# Patient Record
Sex: Female | Born: 1940 | Race: White | Hispanic: No | Marital: Single | Smoking: Former smoker
Health system: Southern US, Community
[De-identification: ages and names within clinical notes are randomized; demographics above are authoritative.]

## PROBLEM LIST (undated history)

## (undated) DIAGNOSIS — J302 Other seasonal allergic rhinitis: Secondary | ICD-10-CM

## (undated) DIAGNOSIS — H269 Unspecified cataract: Secondary | ICD-10-CM

## (undated) DIAGNOSIS — T4145XA Adverse effect of unspecified anesthetic, initial encounter: Secondary | ICD-10-CM

## (undated) DIAGNOSIS — I10 Essential (primary) hypertension: Secondary | ICD-10-CM

## (undated) DIAGNOSIS — B019 Varicella without complication: Secondary | ICD-10-CM

## (undated) DIAGNOSIS — M810 Age-related osteoporosis without current pathological fracture: Secondary | ICD-10-CM

## (undated) DIAGNOSIS — R32 Unspecified urinary incontinence: Secondary | ICD-10-CM

## (undated) DIAGNOSIS — N390 Urinary tract infection, site not specified: Secondary | ICD-10-CM

## (undated) DIAGNOSIS — N183 Chronic kidney disease, stage 3 unspecified: Secondary | ICD-10-CM

## (undated) DIAGNOSIS — C4490 Unspecified malignant neoplasm of skin, unspecified: Secondary | ICD-10-CM

## (undated) DIAGNOSIS — K219 Gastro-esophageal reflux disease without esophagitis: Secondary | ICD-10-CM

## (undated) DIAGNOSIS — C801 Malignant (primary) neoplasm, unspecified: Secondary | ICD-10-CM

## (undated) DIAGNOSIS — Z923 Personal history of irradiation: Secondary | ICD-10-CM

## (undated) DIAGNOSIS — T8859XA Other complications of anesthesia, initial encounter: Secondary | ICD-10-CM

## (undated) DIAGNOSIS — E785 Hyperlipidemia, unspecified: Secondary | ICD-10-CM

## (undated) DIAGNOSIS — G9050 Complex regional pain syndrome I, unspecified: Secondary | ICD-10-CM

## (undated) DIAGNOSIS — Z9221 Personal history of antineoplastic chemotherapy: Secondary | ICD-10-CM

## (undated) DIAGNOSIS — Z9889 Other specified postprocedural states: Secondary | ICD-10-CM

## (undated) DIAGNOSIS — E1165 Type 2 diabetes mellitus with hyperglycemia: Secondary | ICD-10-CM

## (undated) DIAGNOSIS — M1990 Unspecified osteoarthritis, unspecified site: Secondary | ICD-10-CM

## (undated) DIAGNOSIS — R112 Nausea with vomiting, unspecified: Secondary | ICD-10-CM

## (undated) HISTORY — PX: OOPHORECTOMY: SHX86

## (undated) HISTORY — PX: PORT-A-CATH REMOVAL: SHX5289

## (undated) HISTORY — DX: Unspecified malignant neoplasm of skin, unspecified: C44.90

## (undated) HISTORY — DX: Varicella without complication: B01.9

## (undated) HISTORY — DX: Unspecified cataract: H26.9

## (undated) HISTORY — DX: Unspecified osteoarthritis, unspecified site: M19.90

## (undated) HISTORY — PX: TUBAL LIGATION: SHX77

## (undated) HISTORY — DX: Malignant (primary) neoplasm, unspecified: C80.1

## (undated) HISTORY — DX: Other seasonal allergic rhinitis: J30.2

## (undated) HISTORY — DX: Urinary tract infection, site not specified: N39.0

## (undated) HISTORY — PX: TUMOR EXCISION: SHX421

## (undated) HISTORY — DX: Unspecified urinary incontinence: R32

## (undated) HISTORY — PX: OTHER SURGICAL HISTORY: SHX169

---

## 2003-04-15 HISTORY — PX: ABDOMINAL HYSTERECTOMY: SHX81

## 2004-03-12 ENCOUNTER — Other Ambulatory Visit: Payer: Self-pay

## 2004-03-14 ENCOUNTER — Ambulatory Visit: Payer: Self-pay | Admitting: Unknown Physician Specialty

## 2004-04-14 HISTORY — PX: BLADDER TUMOR EXCISION: SHX238

## 2004-07-25 ENCOUNTER — Ambulatory Visit: Payer: Self-pay | Admitting: Unknown Physician Specialty

## 2005-06-26 ENCOUNTER — Ambulatory Visit: Payer: Self-pay | Admitting: Unknown Physician Specialty

## 2006-04-01 ENCOUNTER — Ambulatory Visit: Payer: Self-pay | Admitting: Urology

## 2006-04-29 ENCOUNTER — Other Ambulatory Visit: Payer: Self-pay

## 2006-05-06 ENCOUNTER — Ambulatory Visit: Payer: Self-pay | Admitting: Urology

## 2007-01-06 ENCOUNTER — Ambulatory Visit: Payer: Self-pay | Admitting: Urology

## 2007-01-20 ENCOUNTER — Ambulatory Visit: Payer: Self-pay | Admitting: Urology

## 2007-12-01 LAB — HM PAP SMEAR: HM PAP: NORMAL

## 2007-12-19 LAB — HM MAMMOGRAPHY: HM MAMMO: NORMAL (ref 0–4)

## 2013-04-14 HISTORY — PX: SKIN CANCER EXCISION: SHX779

## 2015-05-10 ENCOUNTER — Encounter: Payer: Self-pay | Admitting: Primary Care

## 2015-05-10 ENCOUNTER — Ambulatory Visit (INDEPENDENT_AMBULATORY_CARE_PROVIDER_SITE_OTHER): Payer: PPO | Admitting: Primary Care

## 2015-05-10 VITALS — BP 136/84 | HR 102 | Temp 97.7°F | Ht 65.5 in | Wt 158.8 lb

## 2015-05-10 DIAGNOSIS — N390 Urinary tract infection, site not specified: Secondary | ICD-10-CM | POA: Insufficient documentation

## 2015-05-10 DIAGNOSIS — I10 Essential (primary) hypertension: Secondary | ICD-10-CM | POA: Diagnosis not present

## 2015-05-10 DIAGNOSIS — N3946 Mixed incontinence: Secondary | ICD-10-CM | POA: Insufficient documentation

## 2015-05-10 DIAGNOSIS — R3981 Functional urinary incontinence: Secondary | ICD-10-CM | POA: Diagnosis not present

## 2015-05-10 DIAGNOSIS — R32 Unspecified urinary incontinence: Secondary | ICD-10-CM | POA: Insufficient documentation

## 2015-05-10 NOTE — Progress Notes (Signed)
Pre visit review using our clinic review tool, if applicable. No additional management support is needed unless otherwise documented below in the visit note. 

## 2015-05-10 NOTE — Patient Instructions (Signed)
Please schedule a physical with me in July 2017. You may also schedule a lab only appointment 3-4 days prior. We will discuss your lab results in detail during your physical.  I think your cough is related to your lisinopril blood pressure medication. Please notify me if you are interested in changing to another blood pressure medication. I recommend this.  It was a pleasure to meet you today! Please don't hesitate to call me with any questions. Welcome to [facility]!

## 2015-05-10 NOTE — Assessment & Plan Note (Signed)
Has RX for cipro 500 mg BID from prior PCP that she will take as needed. UTI's occur every 4-5 months.  No recent symptoms. She is interested in taking cranberry tablets.

## 2015-05-10 NOTE — Assessment & Plan Note (Signed)
Diagnosed years ago. Managed on lisinopril 20 mg for years. Dry cough also for years. Discussed this is likely due to ACE and suggested we trial a different antihypertensive. She will think about this and notify me if interested. Will obtain records for recent BMP.

## 2015-05-10 NOTE — Assessment & Plan Note (Signed)
Functional. 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 Wears depends. Tolerable symptoms. Discussed options.

## 2015-05-10 NOTE — Progress Notes (Signed)
Subjective:    Patient ID: Catherine Tate, female    DOB: [birth year], 75 y.o.   MRN: [id number]  HPI  Ms. Kitchen is a 75 year old female who presents today to establish care and discuss the problems mentioned below. Will obtain old records. Her last physical was in July 2016.  1) Essential Hypertension: Diagnosed years ago. Currently managed on lisinopril 20 mg. Denies chest pain, shortness of breath. Occasional cough which she attributes to sinus drainage. She describes her cough as a dry, tickle, that will occur sporadically. She has a bottle of phenergan with codeine that was prescribed by her prior PCP that provides some improvement with chronic cough.  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 She will have to wear depends and pads daily. She will get urinary tract infections every 4-5 months. She will take Ciprofloxacin 500 mg twice daily for 7-10 days when she gets urinary tract infections. This prescription was provided by her prior PCP.  Review of Systems  Constitutional: Negative for unexpected weight change.  HENT: Negative for rhinorrhea.   Respiratory: Negative for cough and shortness of breath.   Cardiovascular: Negative for chest pain.  Gastrointestinal: Negative for diarrhea and constipation.  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  Skin: Negative for rash.  Allergic/Immunologic: Positive for environmental allergies.  Neurological: Negative for dizziness, numbness and headaches.  Psychiatric/Behavioral:       Denies concerns for anxiety or depression       Past Medical History  Diagnosis Date  . Chickenpox   . Seasonal allergies   . Urine incontinence   . Frequent UTI   . Arthritis     Social History   Social History  . Marital Status: Single    Spouse Name: N/A  . Number of Children: N/A  . Years of Education: N/A   Occupational History  . Not on file.   Social  History Main Topics  . Smoking status: Never Smoker   . Smokeless tobacco: Not on file  . Alcohol Use: No  . Drug Use: Not on file  . Sexual Activity: Not on file   Other Topics Concern  . Not on file   Social History Narrative   Single.   2 children, 1 grandchild.   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   Enjoys making jewelry, puzzle books, sewing, traveling.       Past Surgical History  Procedure Laterality Date  . Bladder tumor excision  2006  . Skin cancer excision  2015    Family History  Problem Relation Age of Onset  . Emphysema Father   . Heart disease Mother   . Transient ischemic attack Mother     Allergies not on file  No current outpatient prescriptions on file prior to visit.   No current facility-administered medications on file prior to visit.    BP 136/84 mmHg  Pulse 102  Temp(Src) 97.7 F (36.5 C) (Oral)  Ht 5' 5.5" (1.664 m)  Wt 158 lb 12.8 oz (72.031 kg)  BMI 26.01 kg/m2  SpO2 96%    Objective:   Physical Exam  Constitutional: She is oriented to person, place, and time. She appears well-nourished.  HENT:  Mouth/Throat: Oropharynx is clear and moist.  Dry cough noted during exam today.  Cardiovascular: Normal rate and regular rhythm.   Pulmonary/Chest: Effort normal and breath sounds  normal.  Neurological: She is alert and oriented to person, place, and time.  Skin: Skin is warm and dry.  Psychiatric: She has a normal mood and affect.          Assessment & Plan:

## 2015-08-15 ENCOUNTER — Other Ambulatory Visit: Payer: Self-pay

## 2015-08-15 MED ORDER — LISINOPRIL 20 MG PO TABS
20.0000 mg | ORAL_TABLET | Freq: Every day | ORAL | Status: DC
Start: 2015-08-15 — End: 2015-11-05

## 2015-08-15 NOTE — Telephone Encounter (Signed)
[place] left v/m requesting refill lisinopril; per 05/10/15 office note may be changing lisinopril;pt has med wellness scheduled 11/05/15. Refilled x 1.

## 2015-10-23 ENCOUNTER — Other Ambulatory Visit: Payer: Self-pay | Admitting: Primary Care

## 2015-10-23 DIAGNOSIS — Z0000 Encounter for general adult medical examination without abnormal findings: Secondary | ICD-10-CM

## 2015-10-23 DIAGNOSIS — I10 Essential (primary) hypertension: Secondary | ICD-10-CM

## 2015-10-29 ENCOUNTER — Other Ambulatory Visit (INDEPENDENT_AMBULATORY_CARE_PROVIDER_SITE_OTHER): Payer: PPO

## 2015-10-29 ENCOUNTER — Ambulatory Visit (INDEPENDENT_AMBULATORY_CARE_PROVIDER_SITE_OTHER): Payer: PPO | Admitting: Internal Medicine

## 2015-10-29 ENCOUNTER — Encounter: Payer: Self-pay | Admitting: Internal Medicine

## 2015-10-29 VITALS — BP 136/72 | HR 102 | Temp 98.2°F | Wt 161.0 lb

## 2015-10-29 DIAGNOSIS — Z0000 Encounter for general adult medical examination without abnormal findings: Secondary | ICD-10-CM | POA: Diagnosis not present

## 2015-10-29 DIAGNOSIS — N390 Urinary tract infection, site not specified: Secondary | ICD-10-CM | POA: Diagnosis not present

## 2015-10-29 DIAGNOSIS — I10 Essential (primary) hypertension: Secondary | ICD-10-CM

## 2015-10-29 DIAGNOSIS — R300 Dysuria: Secondary | ICD-10-CM | POA: Diagnosis not present

## 2015-10-29 LAB — COMPREHENSIVE METABOLIC PANEL
ALBUMIN: 4.3 g/dL (ref 3.5–5.2)
ALT: 24 U/L (ref 0–35)
AST: 19 U/L (ref 0–37)
Alkaline Phosphatase: 54 U/L (ref 39–117)
BUN: 21 mg/dL (ref 6–23)
CO2: 25 mEq/L (ref 19–32)
CREATININE: 1.14 mg/dL (ref 0.40–1.20)
Calcium: 9.3 mg/dL (ref 8.4–10.5)
Chloride: 109 mEq/L (ref 96–112)
GFR: 49.56 mL/min — ABNORMAL LOW (ref >60.00)
GLUCOSE: 101 mg/dL — AB (ref 70–99)
Potassium: 4.3 mEq/L (ref 3.5–5.1)
SODIUM: 139 meq/L (ref 135–145)
TOTAL PROTEIN: 7.5 g/dL (ref 6.0–8.3)
Total Bilirubin: 0.6 mg/dL (ref 0.2–1.2)

## 2015-10-29 LAB — POC URINALSYSI DIPSTICK (AUTOMATED)
BILIRUBIN UA: NEGATIVE
Glucose, UA: NEGATIVE
KETONES UA: NEGATIVE
NITRITE UA: POSITIVE
PH UA: 6
Protein, UA: NEGATIVE
RBC UA: NEGATIVE
SPEC GRAV UA: 1.025
Urobilinogen, UA: NEGATIVE

## 2015-10-29 LAB — LIPID PANEL
CHOLESTEROL: 221 mg/dL — AB (ref 0–200)
HDL: 44.1 mg/dL (ref >39.00)
LDL CALC: 153 mg/dL — AB (ref 0–99)
NONHDL: 177.2
Total CHOL/HDL Ratio: 5
Triglycerides: 122 mg/dL (ref 0.0–149.0)
VLDL: 24.4 mg/dL (ref 0.0–40.0)

## 2015-10-29 LAB — HEMOGLOBIN A1C: HEMOGLOBIN A1C: 5.7 % (ref 4.6–6.5)

## 2015-10-29 LAB — VITAMIN D 25 HYDROXY (VIT D DEFICIENCY, FRACTURES): VITD: 27.37 ng/mL — AB (ref 30.00–100.00)

## 2015-10-29 MED ORDER — CIPROFLOXACIN HCL 250 MG PO TABS: 250.0000 mg | ORAL_TABLET | Freq: Two times a day (BID) | ORAL | Status: DC

## 2015-10-29 NOTE — Progress Notes (Signed)
Pre visit review using our clinic review tool, if applicable. No additional management support is needed unless otherwise documented below in the visit note. 

## 2015-10-29 NOTE — Patient Instructions (Signed)

## 2015-10-29 NOTE — Progress Notes (Signed)
   Subjective:    Patient ID: Catherine Tate, female    DOB: 01-27-1941, 75 y.o.   MRN: [id number]  HPI  Pt presents to the clinic today with complaint of burning with urination x 1 week.  She reports a history of UTI"s 3-4 times/year and a history of incontinence.  Her last UTI was in January, treated with Ciprofloxacin from prior PCP.  She has been taking Azo for 1 week with some relief.  She denies hematuria, changes in frequency, back pain, abdominal pain, nausea, or vomiting.     Review of Systems   Past Medical History  Diagnosis Date  . Chickenpox   . Seasonal allergies   . Urine incontinence   . Frequent UTI   . Arthritis     Current Outpatient Prescriptions  Medication Sig Dispense Refill  . lisinopril (PRINIVIL,ZESTRIL) 20 MG tablet Take 1 tablet (20 mg total) by mouth daily. 90 tablet 0  . ciprofloxacin (CIPRO) 250 MG tablet Take 1 tablet (250 mg total) by mouth 2 (two) times daily. 14 tablet 0   No current facility-administered medications for this visit.    Allergies  Allergen Reactions  . Other Other (See Comments)    Uncoded Allergy. Allergen: VICRYL, Other Reaction: Infection  . Gabapentin Swelling    Family History  Problem Relation Age of Onset  . Emphysema Father   . Heart disease Mother   . Transient ischemic attack Mother     Social History   Social History  . Marital Status: Single    Spouse Name: N/A  . Number of Children: N/A  . Years of Education: N/A   Occupational History  . Not on file.   Social History Main Topics  . Smoking status: Never Smoker   . Smokeless tobacco: Never Used  . Alcohol Use: No  . Drug Use: Not on file  . Sexual Activity: Not on file   Other Topics Concern  . Not on file   Social History Narrative   Single.   2 children, 1 grandchild.   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   Enjoys making jewelry, puzzle books, sewing, traveling.        GI: Denies abdominal pain, nausea, or vomiting. GU:  Admits to burning with urination.  Denies hematuria or changes in frequency.  No other specific complaints in a complete review of systems (except as listed in HPI above).       Objective:   Physical Exam BP 136/72 mmHg  Pulse 102  Temp(Src) 98.2 F (36.8 C) (Oral)  Wt 161 lb (73.029 kg)  SpO2 97%   General: Well-appearing, appears stated age in no acute distress Pulm: Clear to auscultation bilaterally CV: Regular rate and rhythm. Normal S1, S2 auscultated. No murmurs, rubs, or gallops. Abd: Positive bowel sounds all four quadrants. Soft, nontender to palpation. GU: Negative CVA tenderness.      Assessment & Plan:   Dysuria, secondary to Urinary tract infection  eRx for Ciprofloxacin 250 mg BID x7 days. Culture pending. Push fluids Ok to take AZO OTC  Call if symptoms worsen or do not resolve.

## 2015-10-29 NOTE — Addendum Note (Signed)
Addended by: Lurlean Nanny on: 10/29/2015 04:35 PM   Modules accepted: Orders, SmartSet

## 2015-11-01 ENCOUNTER — Other Ambulatory Visit: Payer: Self-pay | Admitting: Internal Medicine

## 2015-11-01 LAB — URINE CULTURE: Colony Count: >=100000

## 2015-11-01 MED ORDER — NITROFURANTOIN MONOHYD MACRO 100 MG PO CAPS: 100.0000 mg | ORAL_CAPSULE | Freq: Two times a day (BID) | ORAL | Status: DC

## 2015-11-05 ENCOUNTER — Telehealth: Payer: Self-pay | Admitting: Primary Care

## 2015-11-05 ENCOUNTER — Ambulatory Visit (INDEPENDENT_AMBULATORY_CARE_PROVIDER_SITE_OTHER): Payer: PPO | Admitting: Primary Care

## 2015-11-05 ENCOUNTER — Encounter: Payer: Self-pay | Admitting: Primary Care

## 2015-11-05 VITALS — BP 136/82 | HR 100 | Temp 97.4°F | Resp 18 | Ht 65.5 in | Wt 163.4 lb

## 2015-11-05 DIAGNOSIS — Z0000 Encounter for general adult medical examination without abnormal findings: Secondary | ICD-10-CM | POA: Diagnosis not present

## 2015-11-05 DIAGNOSIS — N390 Urinary tract infection, site not specified: Secondary | ICD-10-CM | POA: Diagnosis not present

## 2015-11-05 DIAGNOSIS — I10 Essential (primary) hypertension: Secondary | ICD-10-CM

## 2015-11-05 DIAGNOSIS — Z23 Encounter for immunization: Secondary | ICD-10-CM | POA: Diagnosis not present

## 2015-11-05 MED ORDER — PNEUMOCOCCAL 13-VAL CONJ VACC IM SUSP
0.5000 mL | INTRAMUSCULAR | Status: AC
  Administered 2015-11-05: 0.5 mL via INTRAMUSCULAR

## 2015-11-05 MED ORDER — AMLODIPINE BESYLATE 10 MG PO TABS: 10.0000 mg | ORAL_TABLET | Freq: Every day | ORAL | 1 refills | Status: DC

## 2015-11-05 NOTE — Assessment & Plan Note (Signed)
Tetanus UTD, Prevnar provided today, pneumovax due in 1 year. Declines mammogram, colon cancer screening due to age which is acceptable. Declines bone density testing despite recommendations, will start daily vitamin D. Labs overall unremarkable, slightly elevated cholesterol levels, will supplement vitamin D. Fair diet, does not exercise. Discussed the importance of a healthy diet and regular exercise in order for weight loss and to reduce risk of other medical diseases. Recheck cholesterol in 6 months after healthy lifestyle changes. Exam unremarkable. No complaints.  I have personally reviewed and have noted: 1. The patient's medical and social history 2. Their use of alcohol, tobacco or illicit drugs 3. Their current medications and supplements 4. The patient's functional ability including ADL's, fall  risks, home safety risks and hearing or visual  impairment. 5. Diet and physical activities 6. Evidence for depression or mood disorder  Follow up in 6 months for repeat physical.

## 2015-11-05 NOTE — Progress Notes (Signed)
Pre visit review using our clinic review tool, if applicable. No additional management support is needed unless otherwise documented below in the visit note. 

## 2015-11-05 NOTE — Telephone Encounter (Signed)
Spoken to patient and notified patient that she may come and pick the medical records. Patient asked if there are any part of the medical records she need to keep. Notified patient that Anda Kraft got everything she needed from the file. Patient stated she does not need it and asked to shred the medical records.

## 2015-11-05 NOTE — Patient Instructions (Signed)
Stop Lisinopril tablets for high blood pressure.  Start Amlodipine 10 mg tablets for high blood pressure. Take 1 tablet by mouth everyday.  Monitor your blood pressure and notify me if you develop readings above 150/90 or below 100/60. Please also notify me if you start to experience dizziness or weakness.  You were provided with a pneumonia vaccination. You will be due for one additional vaccination in 1 year.  Work to [employer] by increasing whole grains, lean protein, vegetables.   Start exercising. You should be getting 30 minutes of moderate intensity exercise 3-4 days weekly.  Start Vitamin D 1000 unit capsules once daily to replace vitamin D. This may be purchased over the counter.  Follow up in 6 months for cholesterol check and blood pressure check.  It was a pleasure to see you today!

## 2015-11-05 NOTE — Addendum Note (Signed)
Addended by: Jacqualin Combes on: 11/05/2015 08:56 AM   Modules accepted: Orders, SmartSet

## 2015-11-05 NOTE — Assessment & Plan Note (Signed)
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 Will stop ACE, start Amlodipine. Will have patient monitor home BP, parameters provided. BMP stable. Follow up in 6 months.

## 2015-11-05 NOTE — Progress Notes (Signed)
Patient ID: Catherine Tate, female   DOB: 11-Feb-1941, 75 y.o.   MRN: [id number]  HPI: Ms. Kesselring is a 75 year female who presents today for her annual wellness exam.  Past Medical History:  Diagnosis Date  . Arthritis   . Chickenpox   . Frequent UTI   . Seasonal allergies   . Urine incontinence     Current Outpatient Prescriptions  Medication Sig Dispense Refill  . lisinopril (PRINIVIL,ZESTRIL) 20 MG tablet Take 1 tablet (20 mg total) by mouth daily. 90 tablet 0  . nitrofurantoin, macrocrystal-monohydrate, (MACROBID) 100 MG capsule Take 1 capsule (100 mg total) by mouth 2 (two) times daily. 14 capsule 0   No current facility-administered medications for this visit.     Allergies  Allergen Reactions  . Other Other (See Comments)    Uncoded Allergy. Allergen: VICRYL, Other Reaction: Infection  . Gabapentin Swelling    Family History  Problem Relation Age of Onset  . Emphysema Father   . Heart disease Mother   . Transient ischemic attack Mother     Social History   Social History  . Marital status: Single    Spouse name: N/A  . Number of children: N/A  . Years of education: N/A   Occupational History  . Not on file.   Social History Main Topics  . Smoking status: Never Smoker  . Smokeless tobacco: Never Used  . Alcohol use No  . Drug use: Unknown  . Sexual activity: Not on file   Other Topics Concern  . Not on file   Social History Narrative   Single.   2 children, 1 grandchild.   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   Enjoys making jewelry, puzzle books, sewing, traveling.       Hospitiliaztions: None  Health Maintenance:    Flu: Completed last season  Tetanus: Completed in 2012  Pneumovax: Never completed  Prevnar: Never completed  Zostavax: Completed in 2001  Bone Density: Not completed recently, declines.  Colonoscopy: Never completed. Declines.   Eye Doctor: Completed in 2016, no changes in prescription.  Dental Exam:  Dentures  Mammogram: Completed 4 years ago. Completes self breast exam.  Pap: Hysterectomy    Providers: Alma Friendly, PCP   I have personally reviewed and have noted: 1. The patient's medical and social history 2. Their use of alcohol, tobacco or illicit drugs 3. Their current medications and supplements 4. The patient's functional ability including ADL's, fall risks, home safety risks  and hearing or visual impairment. 5. Diet and physical activities 6. Evidence for depression or mood disorder  Subjective:   Review of Systems:   Constitutional: Denies fever, malaise, fatigue, headache or abrupt weight changes.  HEENT: Denies eye pain, eye redness, ear pain, ringing in the ears, wax buildup, runny nose, nasal congestion, bloody nose, or sore throat. Occasional sinus pressure during seasonal changes. Respiratory: Denies difficulty breathing, shortness of breath, cough or sputum production.   Cardiovascular: Denies chest pain, chest tightness, palpitations or swelling in the hands or feet. She does experience a cough, managed on lisinopril, does not wish to change. Gastrointestinal: Denies abdominal pain, bloating, constipation, diarrhea or blood in the stool.  GU: Denies urgency, frequency, pain with urination, burning sensation, blood in urine, odor or discharge. Treated for UTI last week, feeling improved. Does experience incontinence, overall tolerable. Musculoskeletal: Denies decrease in range of motion, difficulty with gait, muscle pain. Chronic arthritis, not bothersome.  Skin: Denies redness, rashes, lesions or ulcercations.  Neurological:  Denies dizziness, difficulty with memory, difficulty with speech or problems with balance and coordination.   No other specific complaints in a complete review of systems (except as listed in HPI above).  Objective:  PE:   There were no vitals taken for this visit. Wt Readings from Last 3 Encounters:  10/29/15 161 lb (73 kg)   05/10/15 158 lb 12.8 oz (72 kg)    General: Appears their stated age, well developed, well nourished in NAD. Skin: Warm, dry and intact. No rashes, lesions or ulcerations noted. HEENT: Head: normal shape and size; Eyes: sclera white, no icterus, conjunctiva pink, PERRLA and EOMs intact; Ears: Tm's gray and intact, normal light reflex; Nose: mucosa pink and moist, septum midline; Throat/Mouth: Teeth present, mucosa pink and moist, no exudate, lesions or ulcerations noted.  Neck: Normal range of motion. Neck supple, trachea midline. No massses, lumps or thyromegaly present.  Cardiovascular: Normal rate and rhythm. S1,S2 noted.  No murmur, rubs or gallops noted. No JVD or BLE edema. No carotid bruits noted. Pulmonary/Chest: Normal effort and positive vesicular breath sounds. No respiratory distress. No wheezes, rales or ronchi noted.  Abdomen: Soft and nontender. Normal bowel sounds, no bruits noted. No distention or masses noted. Liver, spleen and kidneys non palpable. Musculoskeletal: Normal range of motion. Minor disfigurement from chronic arthritis. No difficulty with gait.  Neurological: Alert and oriented. Cranial nerves II-XII intact. Coordination normal. +DTRs bilaterally. Psychiatric: Mood and affect normal. Behavior is normal. Judgment and thought content normal.    BMET    Component Value Date/Time   NA 139 10/29/2015 0750   K 4.3 10/29/2015 0750   CL 109 10/29/2015 0750   CO2 25 10/29/2015 0750   GLUCOSE 101 (H) 10/29/2015 0750   BUN 21 10/29/2015 0750   CREATININE 1.14 10/29/2015 0750   CALCIUM 9.3 10/29/2015 0750    Lipid Panel     Component Value Date/Time   CHOL 221 (H) 10/29/2015 0750   TRIG 122.0 10/29/2015 0750   HDL 44.10 10/29/2015 0750   CHOLHDL 5 10/29/2015 0750   VLDL 24.4 10/29/2015 0750   LDLCALC 153 (H) 10/29/2015 0750    CBC No results found for: WBC, RBC, HGB, HCT, PLT, MCV, MCH, MCHC, RDW, LYMPHSABS, MONOABS, EOSABS, BASOSABS  Hgb A1C Lab  Results  Component Value Date   HGBA1C 5.7 10/29/2015      Assessment and Plan:   Medicare Annual Wellness Visit:  Diet: Heart healthy Breakfast: cereal, oatmeal Lunch: soup, sandwich Dinner: eggs, ham, vegetables,  Snack: granola bars, animal crackers Desserts: 2-3 times weekly Beverages: Water, coffee, ginger ale Physical activity: Active Depression/mood screen: Negative Hearing: Intact to whispered voice Visual acuity: Grossly normal, performs annual eye exam  ADLs: Capable Fall risk: None Home safety: Good Cognitive evaluation: Intact to orientation, naming, recall and repetition EOL planning: Adv directives packet provided today. Patient will complete and bring copy.  Preventative Medicine: Tetanus UTD, Prevnar provided today, pneumovax due in 1 year. Declines mammogram, colon cancer screening due to age which is acceptable. Declines bone density testing despite recommendations, will start daily vitamin D. Labs overall unremarkable, slightly elevated cholesterol levels, will supplement vitamin D. Fair diet, does not exercise. Discussed the importance of a healthy diet and regular exercise in order for weight loss and to reduce risk of other medical diseases. Recheck cholesterol in 6 months after healthy lifestyle changes. Exam unremarkable. No complaints.   Next appointment: 6 months for re-evaluation of cholesterol and blood pressure.

## 2015-11-05 NOTE — Telephone Encounter (Signed)
Please notify Ms. Peacher that I forgot to provide her with the medical records from Dr. Hardin Negus office. I am finished and she may pick them up at her convenience up fron.

## 2015-11-05 NOTE — Assessment & Plan Note (Signed)
Currently under treatment. Last culture with resistance to Cipro, now on Macrobid. Feeling improved overall.

## 2015-11-26 ENCOUNTER — Telehealth: Payer: Self-pay | Admitting: Primary Care

## 2015-11-26 NOTE — Telephone Encounter (Signed)
-----   Message from [name], NP sent at 11/05/2015  9:40 AM EDT ----- Regarding: BP Please check on Ms. Murphy's BP readings since we stopped her Lisinopril and started Amlodipine. What are they running? Any dizziness?

## 2015-11-26 NOTE — Telephone Encounter (Signed)
Per DPR, left detail message for patient regarding [name]'s comments and for patient to return my call.

## 2015-11-29 NOTE — Telephone Encounter (Signed)
Message left for patient to return my call.  

## 2015-12-26 ENCOUNTER — Other Ambulatory Visit: Payer: Self-pay | Admitting: Internal Medicine

## 2015-12-26 DIAGNOSIS — R300 Dysuria: Secondary | ICD-10-CM

## 2015-12-26 DIAGNOSIS — N390 Urinary tract infection, site not specified: Secondary | ICD-10-CM

## 2015-12-27 ENCOUNTER — Ambulatory Visit (INDEPENDENT_AMBULATORY_CARE_PROVIDER_SITE_OTHER): Payer: PPO | Admitting: Primary Care

## 2015-12-27 VITALS — BP 136/74 | HR 100 | Temp 97.9°F | Ht 65.5 in | Wt 164.4 lb

## 2015-12-27 DIAGNOSIS — N390 Urinary tract infection, site not specified: Secondary | ICD-10-CM | POA: Diagnosis not present

## 2015-12-27 LAB — POC URINALSYSI DIPSTICK (AUTOMATED)
Bilirubin, UA: NEGATIVE
Blood, UA: NEGATIVE
GLUCOSE UA: NEGATIVE
KETONES UA: NEGATIVE
Nitrite, UA: POSITIVE
PROTEIN UA: NEGATIVE
SPEC GRAV UA: 1.015
Urobilinogen, UA: NEGATIVE
pH, UA: 6.5

## 2015-12-27 MED ORDER — NITROFURANTOIN MONOHYD MACRO 100 MG PO CAPS
100.0000 mg | ORAL_CAPSULE | Freq: Two times a day (BID) | ORAL | 0 refills | Status: DC
Start: 2015-12-27 — End: 2016-01-17

## 2015-12-27 NOTE — Progress Notes (Signed)
   Subjective:    Patient ID: Catherine Tate, female    DOB: [birth year], 75 y.o.   MRN: [id number]  HPI  Catherine Tate is a 75 year old female who presents today with a chief complaint of dysuria. She also reports urinary frequency. Her symptoms began 1 week ago. Denies fevers, chills, vaginal discharge, hematuria, abdominal pain. She's not taken anything OTC for her symptoms.   Long history of frequent urinary tract infections. She's had a total of three UTI's in 2017. She wears depends and panty liners daily due to incontinence. 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 She also had a tumor removed. She believes there is nothing that can be done regarding her incontinence   Review of Systems  Constitutional: Negative for fever.  Gastrointestinal: Negative for abdominal pain and nausea.  Genitourinary: Positive for dysuria and frequency. Negative for hematuria and vaginal discharge.  Neurological: Negative for weakness.       Past Medical History:  Diagnosis Date  . Arthritis   . Chickenpox   . Frequent UTI   . Seasonal allergies   . Urine incontinence      Social History   Social History  . Marital status: Single    Spouse name: N/A  . Number of children: N/A  . Years of education: N/A   Occupational History  . Not on file.   Social History Main Topics  . Smoking status: Never Smoker  . Smokeless tobacco: Never Used  . Alcohol use No  . Drug use: Unknown  . Sexual activity: Not on file   Other Topics Concern  . Not on file   Social History Narrative   Single.   2 children, 1 grandchild.   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   Enjoys making jewelry, puzzle books, sewing, traveling.       Past Surgical History:  Procedure Laterality Date  . BLADDER TUMOR EXCISION  2006  . SKIN CANCER EXCISION  2015    Family History  Problem Relation Age of Onset  . Emphysema Father   .  Heart disease Mother   . Transient ischemic attack Mother     Allergies  Allergen Reactions  . Other Other (See Comments)    Uncoded Allergy. Allergen: VICRYL, Other Reaction: Infection  . Gabapentin Swelling    Current Outpatient Prescriptions on File Prior to Visit  Medication Sig Dispense Refill  . amLODipine (NORVASC) 10 MG tablet Take 1 tablet (10 mg total) by mouth daily. (Patient not taking: Reported on 12/27/2015) 90 tablet 1   No current facility-administered medications on file prior to visit.     BP 136/74   Pulse 100   Temp 97.9 F (36.6 C) (Oral)   Ht 5' 5.5" (1.664 m)   Wt 164 lb 6.4 oz (74.6 kg)   SpO2 95%   BMI 26.94 kg/m    Objective:   Physical Exam  Constitutional: She is oriented to person, place, and time. She appears well-nourished.  Cardiovascular: Normal rate and regular rhythm.   Pulmonary/Chest: Effort normal and breath sounds normal.  Abdominal: Soft. Bowel sounds are normal. There is no CVA tenderness.  Neurological: She is alert and oriented to person, place, and time.  Skin: Skin is warm and dry.          Assessment & Plan:

## 2015-12-27 NOTE — Addendum Note (Signed)
Addended by: Jacqualin Combes on: 12/27/2015 02:52 PM   Modules accepted: Orders

## 2015-12-27 NOTE — Patient Instructions (Addendum)
Start Macrobid antibiotics for urinary tract infection. Take 1 tablet by mouth twice daily for 7 days.  Please return to the clinic in 2 weeks for a nurse visit to provided [place] with another urine sample. We will need to start preventative treatment for urinary tract infections.  Ensure you are staying hydrated with water.   You may try taking AZO over the counter for symptoms of burning and irritation.  We will see you in 2 weeks. It was a pleasure to see you today!

## 2015-12-27 NOTE — Progress Notes (Signed)
Pre visit review using our clinic review tool, if applicable. No additional management support is needed unless otherwise documented below in the visit note. 

## 2015-12-27 NOTE — Assessment & Plan Note (Signed)
Urinary frequency and dysuria x 1 week. UA today: 3+ leuks, positive nitrites, neg blood. Culture sent. Rx for Macrobid BID sent to pharmacy as she has a history of resistance to most other antibiotics. Given recurrent UTI's will initiate prophylactic treatment. Will recheck urine in 2 weeks, if negative then will start daily Macrobid. Patient to set up nurse visit in 2 weeks.

## 2015-12-30 LAB — URINE CULTURE

## 2016-01-10 ENCOUNTER — Telehealth: Payer: Self-pay | Admitting: Primary Care

## 2016-01-10 ENCOUNTER — Ambulatory Visit (INDEPENDENT_AMBULATORY_CARE_PROVIDER_SITE_OTHER): Payer: PPO | Admitting: *Deleted

## 2016-01-10 DIAGNOSIS — Z87440 Personal history of urinary (tract) infections: Secondary | ICD-10-CM

## 2016-01-10 DIAGNOSIS — R32 Unspecified urinary incontinence: Secondary | ICD-10-CM

## 2016-01-10 DIAGNOSIS — R350 Frequency of micturition: Secondary | ICD-10-CM | POA: Diagnosis not present

## 2016-01-10 NOTE — Telephone Encounter (Signed)
Message left for patient to return my call.  

## 2016-01-10 NOTE — Telephone Encounter (Addendum)
-----   Message from [name], NP sent at 12/27/2015  2:30 PM EDT ----- Regarding: UTI Please check on patient. Is she feeling better after the antibiotics for her UTI? She needs repeat UA with Culture for prophylactic treatment of UTI. Please schedule her for a nurse visit to obtain.

## 2016-01-11 NOTE — Telephone Encounter (Signed)
Tried to call patient and it stated that the machine is off. Will try again.  Need to schedule patient for lab appt for U/A

## 2016-01-12 LAB — URINE CULTURE

## 2016-01-14 ENCOUNTER — Other Ambulatory Visit: Payer: Self-pay | Admitting: Primary Care

## 2016-01-14 ENCOUNTER — Other Ambulatory Visit (INDEPENDENT_AMBULATORY_CARE_PROVIDER_SITE_OTHER): Payer: PPO | Admitting: Primary Care

## 2016-01-14 DIAGNOSIS — R32 Unspecified urinary incontinence: Secondary | ICD-10-CM

## 2016-01-14 DIAGNOSIS — R350 Frequency of micturition: Secondary | ICD-10-CM | POA: Diagnosis not present

## 2016-01-14 DIAGNOSIS — Z87440 Personal history of urinary (tract) infections: Secondary | ICD-10-CM

## 2016-01-14 NOTE — Telephone Encounter (Signed)
Message left for patient to return my call.  

## 2016-01-15 LAB — URINALYSIS, ROUTINE W REFLEX MICROSCOPIC
BILIRUBIN URINE: NEGATIVE
GLUCOSE, UA: NEGATIVE
HGB URINE DIPSTICK: NEGATIVE
KETONES UR: NEGATIVE
NITRITE: NEGATIVE
PH: 6 (ref 5.0–8.0)
Protein, ur: NEGATIVE
Specific Gravity, Urine: 1.01 (ref 1.001–1.035)

## 2016-01-15 LAB — URINALYSIS, MICROSCOPIC ONLY
Bacteria, UA: NONE SEEN [HPF]
CASTS: NONE SEEN [LPF]
Crystals: NONE SEEN [HPF]
Squamous Epithelial / LPF: NONE SEEN [HPF] (ref <=5)
YEAST: NONE SEEN [HPF]

## 2016-01-16 ENCOUNTER — Telehealth: Payer: Self-pay

## 2016-01-16 NOTE — Telephone Encounter (Signed)
Did you call patient with my comments regarding recurrent UTI's? Her UTI has resolved based off of the culture I received last week.

## 2016-01-16 NOTE — Telephone Encounter (Signed)
Spoken and notified patient of [name]'s comments and lab results. Patient verbalized understanding.

## 2016-01-16 NOTE — Telephone Encounter (Signed)
Pt left v/m requesting cb pt is waiting on urine results to see if med is going to be sent to [place]. Pt said she got dizzy and nauseated on 01/15/16; feeling slightly better today. Pt not sure if coming from new med or UTI.

## 2016-01-16 NOTE — Telephone Encounter (Signed)
Patient called and asked for a return call at [phone].

## 2016-01-17 ENCOUNTER — Other Ambulatory Visit: Payer: Self-pay | Admitting: Primary Care

## 2016-01-17 ENCOUNTER — Telehealth: Payer: Self-pay | Admitting: *Deleted

## 2016-01-17 DIAGNOSIS — N390 Urinary tract infection, site not specified: Secondary | ICD-10-CM

## 2016-01-17 MED ORDER — NITROFURANTOIN MONOHYD MACRO 100 MG PO CAPS: ORAL_CAPSULE | ORAL | 1 refills | Status: DC

## 2016-01-17 NOTE — Telephone Encounter (Signed)
-----   Message from [name], NP sent at 01/15/2016  8:25 AM EDT ----- Regarding: RE: Pt couldn't give urine specimen, will try again either later today or tomorrow. For some reason a urine microscopic was ordered, I do not recall ordering this, can it be refunded to the patient? All I need is the culture that was completed already.  Thanks, Anda Kraft  ----- Message ----- From: Marchia Bond Sent: 01/15/2016   7:33 AM To: [name], NP Subject: RE: Pt couldn't give urine specimen, will tr#  She actually brought back the urine specimen yesterday afternoon. Sorry I wasn't aware she didn't need a specimen. Delsa Sale came to me because the pt was in the restroom trying to give a specimen. It was something along the lines of that Vallarie Mare had been trying to get a hold of the pt, she and asked me to order the ua/ and cx because, she didn't know how yet. When the pt came out she said that she couldn't give a specimen, and that's when I sent you and Vallarie Mare the message. It may be too late to cancel the U/A, but I can probably cancel the culture if you want.   Thanks Aniceto Boss ----- Message ----- From: [name], NP Sent: 01/14/2016   6:39 PM To: Marchia Bond Subject: RE: Pt couldn't give urine specimen, will tr#  She doesn't need to provide a urine specimen as she did this last week. Please call and notify.  Thanks! Allie Bossier, NP-C  ----- Message ----- From: Marchia Bond Sent: 01/14/2016   1:34 PM To: [name], NP, # Subject: Pt couldn't give urine specimen, will try ag#  Pt couldn't give a urine specimen today. I gave her everything needed to do it at home and bring it in. She will bring it back either today or tomorrow am.  Thanks Aniceto Boss

## 2016-01-31 ENCOUNTER — Telehealth: Payer: Self-pay | Admitting: Primary Care

## 2016-01-31 DIAGNOSIS — Z1239 Encounter for other screening for malignant neoplasm of breast: Secondary | ICD-10-CM

## 2016-01-31 NOTE — Telephone Encounter (Signed)
Noted. Mammogram ordered. She should call [place] at [facility] to schedule. Please provide her with this number.

## 2016-01-31 NOTE — Telephone Encounter (Signed)
Pt called to request that we schedule her a mammogram in [place].  Can you please coordinate and call her at [phone]

## 2016-02-01 NOTE — Telephone Encounter (Signed)
Spoken and notified patient of [name]'s comments. Patient verbalized understanding. 

## 2016-02-29 ENCOUNTER — Ambulatory Visit
Admission: RE | Admit: 2016-02-29 | Discharge: 2016-02-29 | Disposition: A | Discharge status: Home / Self Care | Payer: PPO | Source: Ambulatory Visit | Attending: Primary Care | Admitting: Primary Care

## 2016-02-29 ENCOUNTER — Telehealth: Payer: Self-pay

## 2016-02-29 DIAGNOSIS — Z1239 Encounter for other screening for malignant neoplasm of breast: Secondary | ICD-10-CM

## 2016-02-29 DIAGNOSIS — Z1231 Encounter for screening mammogram for malignant neoplasm of breast: Secondary | ICD-10-CM | POA: Diagnosis not present

## 2016-02-29 DIAGNOSIS — R928 Other abnormal and inconclusive findings on diagnostic imaging of breast: Secondary | ICD-10-CM | POA: Diagnosis not present

## 2016-02-29 DIAGNOSIS — R921 Mammographic calcification found on diagnostic imaging of breast: Secondary | ICD-10-CM | POA: Insufficient documentation

## 2016-02-29 NOTE — Telephone Encounter (Signed)
Spoken and notified patient of [name]'s comments. Patient verbalized understanding.  Patient has a little cough. Don't know that what pressure running.  Patient is schedule for Monday 03/03/2016

## 2016-02-29 NOTE — Telephone Encounter (Signed)
Is she experiencing the cough now since she's been off of lisinopril? What are her blood pressures running? Agree that we need to stop the Amlodipine.

## 2016-02-29 NOTE — Telephone Encounter (Signed)
Pt started amlodipine 10 mg one daily after finishing lisinopril in mid Sept. Last month within 3 - 4 hours of taking amlodipine pt's lower legs and feet begin to swell and pt feels jittery. Swelling goes away during the night while pt sleeping. Has occasional H/A but no H/A today. No CP, SOB or dizziness. Pt last took Amlodipine this morning. No swelling this morning in lower legs or feet but pt said has not been that long since took med so swelling has not had time to start. Pt has not had any diet changes and does not add extra salt to food. Pt would like to stop amlodipine and restart the lisinopril. Pt does not think lisinopril was causing cough. [place]. Pt request cb.

## 2016-03-03 ENCOUNTER — Ambulatory Visit (INDEPENDENT_AMBULATORY_CARE_PROVIDER_SITE_OTHER): Payer: PPO | Admitting: Primary Care

## 2016-03-03 ENCOUNTER — Encounter: Payer: Self-pay | Admitting: Primary Care

## 2016-03-03 ENCOUNTER — Other Ambulatory Visit: Payer: Self-pay | Admitting: Primary Care

## 2016-03-03 VITALS — BP 150/92 | HR 102 | Temp 98.2°F | Ht 65.5 in | Wt 166.0 lb

## 2016-03-03 DIAGNOSIS — I10 Essential (primary) hypertension: Secondary | ICD-10-CM

## 2016-03-03 DIAGNOSIS — N6320 Unspecified lump in the left breast, unspecified quadrant: Secondary | ICD-10-CM

## 2016-03-03 DIAGNOSIS — R921 Mammographic calcification found on diagnostic imaging of breast: Secondary | ICD-10-CM

## 2016-03-03 DIAGNOSIS — Z23 Encounter for immunization: Secondary | ICD-10-CM

## 2016-03-03 DIAGNOSIS — N6321 Unspecified lump in the left breast, upper outer quadrant: Secondary | ICD-10-CM | POA: Diagnosis not present

## 2016-03-03 MED ORDER — HYDROCHLOROTHIAZIDE 25 MG PO TABS: 25.0000 mg | ORAL_TABLET | Freq: Every day | ORAL | 1 refills | Status: DC

## 2016-03-03 NOTE — Progress Notes (Signed)
Pre visit review using our clinic review tool, if applicable. No additional management support is needed unless otherwise documented below in the visit note. 

## 2016-03-03 NOTE — Patient Instructions (Signed)
Start hydrochlorothiazide 25 mg tablets for high blood pressure. Take 1 tablet by mouth every morning.  Stop amlodipine (Norvasc) 10 mg tablets for high blood pressure.   Check your blood pressure daily, around the same time of day, for the next 2 weeks.  Ensure that you have rested for 30 minutes prior to checking your blood pressure. Record your readings and I'll call you for those readings in 2 weeks.  Have a great Thanksgiving! It was a pleasure to see you today!

## 2016-03-03 NOTE — Progress Notes (Signed)
   Subjective:    Patient ID: Catherine Tate, female    DOB: 1940-09-17, 75 y.o.   MRN: [id number]  HPI  Catherine Tate is a 74 year old female who presents today for follow up of hypertension. She was previously managed on Lisinopril and reported a chronic cough during her appointment in July 2017. She was switched from Lisinopril to Amlodipine at that visit. She called in several days ago reporting ankle edema and "feeling jittery" since starting her Amlodipine in September. She did not start Amlodipine until September and she has not recently monitored her blood pressure.  Her blood pressure is 150/92 in the office today. She's not checking her BP at home. Her last dose of Amlodipine was Friday last week. Her cough is gone. She denies chest pain, blurred vision, weakness. She would like to restart her Lisinopril given the side effects of Amlodipine.   Review of Systems  Eyes: Negative for visual disturbance.  Respiratory: Negative for shortness of breath.   Cardiovascular: Negative for chest pain.       Ankle edema  Neurological: Negative for dizziness and headaches.       Past Medical History:  Diagnosis Date  . Arthritis   . Chickenpox   . Frequent UTI   . Seasonal allergies   . Urine incontinence      Social History   Social History  . Marital status: Single    Spouse name: N/A  . Number of children: N/A  . Years of education: N/A   Occupational History  . Not on file.   Social History Main Topics  . Smoking status: Never Smoker  . Smokeless tobacco: Never Used  . Alcohol use No  . Drug use: Unknown  . Sexual activity: Not on file   Other Topics Concern  . Not on file   Social History Narrative   Single.   2 children, 1 grandchild.   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   Enjoys making jewelry, puzzle books, sewing, traveling.       Past Surgical History:  Procedure Laterality Date  . BLADDER TUMOR EXCISION  2006  . SKIN CANCER EXCISION  2015     Family History  Problem Relation Age of Onset  . Emphysema Father   . Heart disease Mother   . Transient ischemic attack Mother   . Breast cancer Neg Hx     Allergies  Allergen Reactions  . Other Other (See Comments)    Uncoded Allergy. Allergen: VICRYL, Other Reaction: Infection  . Gabapentin Swelling    Current Outpatient Prescriptions on File Prior to Visit  Medication Sig Dispense Refill  . nitrofurantoin, macrocrystal-monohydrate, (MACROBID) 100 MG capsule Take 1 capsule by mouth every Monday, Wednesday, and Saturday for UTI prevention. 12 capsule 1   No current facility-administered medications on file prior to visit.     BP (!) 150/92   Pulse (!) 102   Temp 98.2 F (36.8 C) (Oral)   Ht 5' 5.5" (1.664 m)   Wt 166 lb (75.3 kg)   SpO2 97%   BMI 27.20 kg/m    Objective:   Physical Exam  Constitutional: She appears well-nourished.  Neck: Neck supple.  Cardiovascular: Normal rate and regular rhythm.   No lower extremity edema noted.  Pulmonary/Chest: Effort normal and breath sounds normal.  Skin: Skin is warm and dry.          Assessment & Plan:

## 2016-03-03 NOTE — Assessment & Plan Note (Signed)
BP above goal today, no medication for 3 days. Cough gone since we removed Lisinopril. Rx for HCTZ 25 mg sent to pharmacy given side effects of Amlodipine. Discussed for her to obtain a BP cuff and monitor readings for the next 2 weeks. We will call in 2 weeks for readings. Will also need BMP next visit.

## 2016-03-03 NOTE — Addendum Note (Signed)
Addended by: Jacqualin Combes on: 03/03/2016 04:11 PM   Modules accepted: Orders

## 2016-03-13 ENCOUNTER — Ambulatory Visit
Admission: RE | Admit: 2016-03-13 | Discharge: 2016-03-13 | Disposition: A | Discharge status: Home / Self Care | Payer: PPO | Source: Ambulatory Visit | Attending: Primary Care | Admitting: Primary Care

## 2016-03-13 ENCOUNTER — Other Ambulatory Visit: Payer: Self-pay | Admitting: Primary Care

## 2016-03-13 DIAGNOSIS — R928 Other abnormal and inconclusive findings on diagnostic imaging of breast: Secondary | ICD-10-CM

## 2016-03-13 DIAGNOSIS — R921 Mammographic calcification found on diagnostic imaging of breast: Secondary | ICD-10-CM

## 2016-03-13 DIAGNOSIS — C50912 Malignant neoplasm of unspecified site of left female breast: Secondary | ICD-10-CM | POA: Diagnosis not present

## 2016-03-13 DIAGNOSIS — N6321 Unspecified lump in the left breast, upper outer quadrant: Secondary | ICD-10-CM | POA: Diagnosis not present

## 2016-03-13 DIAGNOSIS — N6320 Unspecified lump in the left breast, unspecified quadrant: Secondary | ICD-10-CM | POA: Insufficient documentation

## 2016-03-13 DIAGNOSIS — C50812 Malignant neoplasm of overlapping sites of left female breast: Secondary | ICD-10-CM | POA: Diagnosis not present

## 2016-03-13 DIAGNOSIS — C801 Malignant (primary) neoplasm, unspecified: Secondary | ICD-10-CM

## 2016-03-13 HISTORY — DX: Malignant (primary) neoplasm, unspecified: C80.1

## 2016-03-13 HISTORY — PX: BREAST BIOPSY: SHX20

## 2016-03-17 ENCOUNTER — Other Ambulatory Visit: Payer: Self-pay | Admitting: Primary Care

## 2016-03-17 ENCOUNTER — Telehealth: Payer: Self-pay | Admitting: Primary Care

## 2016-03-17 DIAGNOSIS — N649 Disorder of breast, unspecified: Secondary | ICD-10-CM

## 2016-03-17 DIAGNOSIS — N390 Urinary tract infection, site not specified: Secondary | ICD-10-CM

## 2016-03-17 MED ORDER — CEPHALEXIN 250 MG PO CAPS: ORAL_CAPSULE | ORAL | 5 refills | Status: DC

## 2016-03-17 NOTE — Telephone Encounter (Signed)
This doesn't seem correct. Please schedule her for BP check in 4 weeks. Have her notify me if her BP continously runs at or above 150/90. I did send in Cephalexin antibiotics for UTI prevention earlier today. She is to take 1 capsule by mouth every Monday, Wednesday, and Saturday. She is to stop taking Macrobid.

## 2016-03-17 NOTE — Telephone Encounter (Signed)
-----   Message from [name], NP sent at 03/03/2016  8:19 AM EST ----- Regarding: BP readings Check on patient's BP readings since we switched her from Amlodipine to HCTZ.

## 2016-03-17 NOTE — Telephone Encounter (Signed)
Received phone call from [facility] this morning who reports breast biopsy positive for carcinoma. [place] Poli will arrange referrals to Oncology. Spoke with patient regarding her breast biopsy and answered all questions. Patient also requested different medication for UTI prevention as Macrobid is too expensive. Rx changed to cephalexin as she is resistant to Cipro and Bactrim. Discussed to stop use of Macrobid.

## 2016-03-17 NOTE — Telephone Encounter (Signed)
Spoken to patient and she stated that her blood pressure been running high lately, in the 150/86, 140/90, and so on.   Patient also stated that Anda Kraft was going to send some medication for her bladder infection.

## 2016-03-18 ENCOUNTER — Telehealth: Payer: Self-pay | Admitting: *Deleted

## 2016-03-18 NOTE — Telephone Encounter (Signed)
Spoken and notified patient of [name]'s comments. Patient verbalized understanding.  Patient has follow up appt on 05/07/2016

## 2016-03-18 NOTE — Telephone Encounter (Signed)
Spoke with Mechele Claude regarding concerns. Order for breast biopsy placed for further evaluation of second lesion.

## 2016-03-18 NOTE — Telephone Encounter (Signed)
Mechele Claude with [facility] called to give you more info regarding pt from radiologist. She is requesting a cb at [phone].

## 2016-03-18 NOTE — Addendum Note (Signed)
Addended by: [name] on: 03/18/2016 01:58 PM   Modules accepted: Orders

## 2016-03-18 NOTE — Telephone Encounter (Signed)
Called patient to establish navigation services.  Patient is newly diagnosed with with left invasive mammary carcinoma.  I have scheduled her to see Dr. Bary Castilla per her request on Wednesday, December 6, @ 11:00.  She is to take a photo ID and all her meds, and to arrive 30 early.  She is also scheduled for medical oncology consult with Dr. Grayland Ormond on Thursday, March 20, 2016 @ 3:30.  Will take educational literature to Dr. Dwyane Luo office for the patient to pick up.  She is agreeable.     Oncology Nurse Navigator Documentation  Navigator Location: [facility] (03/18/16 1100) Referral date to RadOnc/MedOnc: 03/20/16 (03/18/16 1100) )Navigator Encounter Type: Introductory phone call (03/18/16 1100)   Abnormal Finding Date: 03/13/16 (03/18/16 1100) Confirmed Diagnosis Date: 03/14/16 (03/18/16 1100)                   Barriers/Navigation Needs: Education;Coordination of Care (03/18/16 1100)   Interventions: Coordination of Care (03/18/16 1100)            Acuity: Level 2 (03/18/16 1100)         Time Spent with Patient: 30 (03/18/16 1100)

## 2016-03-19 ENCOUNTER — Ambulatory Visit (INDEPENDENT_AMBULATORY_CARE_PROVIDER_SITE_OTHER): Payer: PPO | Admitting: General Surgery

## 2016-03-19 ENCOUNTER — Inpatient Hospital Stay: Payer: Self-pay

## 2016-03-19 ENCOUNTER — Encounter: Payer: Self-pay | Admitting: General Surgery

## 2016-03-19 VITALS — BP 154/82 | HR 94 | Resp 16 | Ht 66.0 in | Wt 162.0 lb

## 2016-03-19 DIAGNOSIS — C50412 Malignant neoplasm of upper-outer quadrant of left female breast: Secondary | ICD-10-CM

## 2016-03-19 DIAGNOSIS — N6002 Solitary cyst of left breast: Secondary | ICD-10-CM | POA: Diagnosis not present

## 2016-03-19 DIAGNOSIS — Z853 Personal history of malignant neoplasm of breast: Secondary | ICD-10-CM | POA: Insufficient documentation

## 2016-03-19 DIAGNOSIS — N6082 Other benign mammary dysplasias of left breast: Secondary | ICD-10-CM | POA: Diagnosis not present

## 2016-03-19 NOTE — Patient Instructions (Addendum)
The patient is aware to call back for any questions or concerns.  

## 2016-03-19 NOTE — Progress Notes (Signed)
Patient ID: Catherine Tate, female   DOB: 1940/11/01, 75 y.o.   MRN: [id number]  Chief Complaint  Patient presents with  . Breast Problem    cancer    HPI Catherine Tate is a 75 y.o. female.  who presents for a breast evaluation. She noticed a throbbing in the left breast outer quadrant area and thought it was a pulled muscle, but when it failed to resolve over a couple of weeks she decided to have a mammogram. The throbbing was not every day at first then it began to be daily.  She remembers being seen for a breast cyst many years ago but did not have any surgical interventions.  Her most recent left mammogram and biopsy was done on 03-13-16 showing cancer.   Patient does perform regular self breast checks and does not get regular mammograms done.    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  She is retired last year from [employer].   HPI  Past Medical History:  Diagnosis Date  . Arthritis   . Cancer ([place]) 03/13/2016   INVASIVE MAMMARY CARCINOMA  . Chickenpox   . Frequent UTI   . Seasonal allergies   . Urine incontinence     Past Surgical History:  Procedure Laterality Date  . ABDOMINAL HYSTERECTOMY  2005   total/ Dr Davis Gourd  . BLADDER TUMOR EXCISION  2006  . BREAST BIOPSY Left 03/13/2016   INVASIVE MAMMARY CARCINOMA  . SKIN CANCER EXCISION Right 2015   foot/ Dr Nevada Crane    Family History  Problem Relation Age of Onset  . Emphysema Father   . Heart disease Mother   . Transient ischemic attack Mother   . Breast cancer Cousin     paternal    Social History Social History  Substance Use Topics  . Smoking status: Former Smoker    Years: 20.00    Quit date: 04/14/1994  . Smokeless tobacco: Never Used  . Alcohol use No    Allergies  Allergen Reactions  . Other Other (See Comments)    Uncoded Allergy. Allergen: VICRYL, Other Reaction: Infection  . Gabapentin Swelling    Current Outpatient Prescriptions  Medication Sig  Dispense Refill  . cephALEXin (KEFLEX) 250 MG capsule Take 1 capsule by mouth every Monday, Wednesday, and Saturday for UTI prevention. 15 capsule 5  . hydrochlorothiazide (HYDRODIURIL) 25 MG tablet Take 1 tablet (25 mg total) by mouth daily. 30 tablet 1   No current facility-administered medications for this visit.     Review of Systems Review of Systems  Constitutional: Negative.   HENT: Negative.   Eyes: Negative.   Respiratory: Negative.   Cardiovascular: Negative.   Endocrine: Negative.   Genitourinary: Negative.  Negative for difficulty urinating.  Musculoskeletal: Negative.   Allergic/Immunologic: Negative.   Neurological: Negative.   Hematological: Negative.   Psychiatric/Behavioral: Negative.     Blood pressure (!) 154/82, pulse 94, resp. rate 16, height '5\' 6"'  (1.676 m), weight 162 lb (73.5 kg).  Physical Exam Physical Exam  Constitutional: She is oriented to person, place, and time. She appears well-developed and well-nourished.  HENT:  Mouth/Throat: Oropharynx is clear and moist.  Eyes: Conjunctivae are normal. No scleral icterus.  Neck: Neck supple.  Cardiovascular: Normal rate, regular rhythm and normal heart sounds.   Pulmonary/Chest: Effort normal and breath sounds normal. Right breast exhibits tenderness. Right breast exhibits no inverted nipple, no mass, no nipple discharge and no skin change. Left breast  exhibits skin change. Left breast exhibits no inverted nipple, no mass, no nipple discharge and no tenderness.    Tender upper outer quadrant right breast. Little bruising at biopsy site left breast  Abdominal: Soft.  Musculoskeletal: Normal range of motion.  Lymphadenopathy:    She has no cervical adenopathy.    She has no axillary adenopathy.       Left: No supraclavicular adenopathy present.  Neurological: She is alert and oriented to person, place, and time.  Skin: Skin is warm and dry.  Psychiatric: Her behavior is normal.    Data Reviewed [place]  mammograms dated 02/29/2016 showed 2 areas in the left breast, as well as an area of microcalcification in the right breast that required additional imaging. I read-0.  Additional views of the breasts were obtained on November 30. A 2.4 cm mass in the upper-outer quadrant of left breast was identified as well as a 0.8 cm benign-appearing nodule in the 3:00 position of the left breast. Benign calcifications in the right. BI-RADS-5.  The patient underwent ultrasound-guided biopsy on 03/13/2016 showed a histologic grade 2 invasive mammary carcinoma. ER positive, PR positive, HER-2/neu: 2+ by immunohistochemistry. Reflex 2 fishes pending.   Ultrasound examination of the left breast was done to assess the second area of the breast.  The dominant mass at the 1:30 o'clock position, 8 cm from the nipple in the upper outer quadrant of the right breast shows a multilobulated irregular mass measuring 1.99 x 2.12 x 2.4 cm. Dense acoustic shadowing. Area of previous biopsy, BIRAD-5.  In the retroareolar area of the left breast at 3:00 position a slightly irregular hypoechoic nodule measuring 0.35 x 0.43 x 0.46 cm were identified. Faint acoustic enhancement was noted. The patient was amenable to FNA sampling and this was completed making use of 1 mL of 1% plain Xylocaine. This yielded a small volume of clear fluid with near complete resolution of the area. Slides 4 were prepared for cytology.  At the 3:00 position, 3 cm from the nipple a hypoechoic smoothly marginated mass measuring 0.37 x 0.7 x 0.71 cm with posterior acoustic enhancement was noted. This was aspirated making use of 1 mL of 1% plain Xylocaine with complete resolution. The fluid was discarded. BI-RADS-2.  Ultrasound examination of the upper-outer quadrant of the right breast in the area of clinical tenderness was completed. The only finding was a small bilobed cyst at the 12:00 position, 4 cm from the nipple measuring less than 0.3 cm in maximal  diameter. Posterior acoustic enhancement was noted. No internal vascular flow. BIRAD-2.     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   Likely complex cyst in the retroareolar position status post aspiration, cytology pending.     Plan    The discussion centered around the 2 main issues today: 1)  HER-2/neu results, if positive the patient would certainly be a candidate for neoadjuvant chemotherapy based on the size and 2) the unlikely possibility that the retroareolar area is a second malignancy.  Pros and cons of mastectomy versus breast conservation were reviewed. The patient has her son living at the home, and she is concerned about the possibility of not being able to provide for her own care.  We reviewed that breast conservation with radiation and mastectomy or equivalent procedures, but we need to get a formal opinion from medical oncology regarding the potential benefit of neoadjuvant chemotherapy based on her stage II disease.  The patient has  an appointment scheduled for tomorrow with Delight Hoh, M.D. for medical oncology. My hope is at that time the HER-2/neu fish study will be available and the results of today's cytology sample will be forwarded to him as soon as possible. My suspicions for a second malignancy are quite small.  We spent approximately an hour reviewing options for management. A brief summary brochure of option/ treatments was provided. The patient reports she does not have a computer and for that reason Web access sites were not given.   Follow-up will take place after assessment by medical oncology.      CEA, Ca 27.29, Met C and CBC drawn today.  This information has been scribed by Karie Fetch RN, BSN,BC.   [name] 03/19/2016, 12:49 PM

## 2016-03-19 NOTE — Progress Notes (Signed)
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  INTERVAL HISTORY: Patient is 75 year old female who recently noticed some tenderness and "cramping" in her left breast. She subsequently palpated a nodule which led to mammogram, ultrasound, biopsy revealing the above stated breast cancer. She continues to have tenderness at the site of her biopsy. She also is highly anxious, but otherwise feels well. She has no neurologic complaints. She denies any recent fevers or illnesses. She has a good appetite and denies weight loss. She has no chest pain or shortness of breath. She denies any nausea, vomiting, constipation, or diarrhea. She has no urinary complaints. Patient offers no further specific complaints today.  REVIEW OF SYSTEMS:   Review of Systems  Constitutional: Negative.  Negative for fever, malaise/fatigue and weight loss.  Respiratory: Negative.  Negative for cough and shortness of breath.   Cardiovascular: Negative.  Negative for chest pain and leg swelling.  Gastrointestinal: Negative.  Negative for abdominal pain.  Musculoskeletal: Negative.   Neurological: Negative.  Negative for weakness.  Psychiatric/Behavioral: The patient is nervous/anxious.     As per HPI. Otherwise, a complete review of systems is negative.  PAST MEDICAL HISTORY: Past Medical History:  Diagnosis Date  . Arthritis   . Cancer ([place]) 03/13/2016   INVASIVE MAMMARY CARCINOMA  . Chickenpox   . Frequent UTI   . Seasonal allergies   . Skin cancer   . Urine incontinence     PAST SURGICAL HISTORY: Past  Surgical History:  Procedure Laterality Date  . ABDOMINAL HYSTERECTOMY  2005   total/ Dr Davis Gourd  . BLADDER TUMOR EXCISION  2006  . BREAST BIOPSY Left 03/13/2016   INVASIVE MAMMARY CARCINOMA  . SKIN CANCER EXCISION Right 2015   foot/ Dr Nevada Crane    FAMILY HISTORY: Family History  Problem Relation Age of Onset  . Emphysema Father   . Heart disease Mother   . Transient ischemic attack Mother   . Breast cancer Cousin     paternal    ADVANCED DIRECTIVES (Y/N):  N  HEALTH MAINTENANCE: Social History  Substance Use Topics  . Smoking status: Former Smoker    Years: 20.00    Quit date: 04/14/1994  . Smokeless tobacco: Never Used  . Alcohol use No     Colonoscopy:  PAP:  Bone density:  Lipid panel:  Allergies  Allergen Reactions  . Other Other (See Comments)    Uncoded Allergy. Allergen: VICRYL  (90% glycolide and 10% L-lactide [polyglactin-- synthetic absorbable sterile surgical suture]), Other Reaction: Infection  . Gabapentin Swelling    Current Outpatient Prescriptions  Medication Sig Dispense Refill  . cephALEXin (KEFLEX) 250 MG capsule Take 1 capsule by mouth every Monday, Wednesday, and Saturday for UTI prevention. 15 capsule 5  . hydrochlorothiazide (HYDRODIURIL) 25 MG tablet Take 1 tablet (25 mg total) by mouth daily. 30 tablet 1  . acetaminophen (TYLENOL) 500 MG tablet Take 500 mg by mouth every 6 (six) hours as needed (for pain.).    Marland Kitchen lidocaine-prilocaine (EMLA) cream Apply 1 application topically as needed. Apply cream to port 1-2 hours prior to chemotherapy appointment. Cover with plastic wrap. 30 g  2   No current facility-administered medications for this visit.     OBJECTIVE: Vitals:   03/20/16 1528  BP: (!) 146/98  Pulse: (!) 111  Resp: 18  Temp: 97.9 F (36.6 C)     Body mass index is 26.37 kg/m.    ECOG FS:0 - Asymptomatic  General: Well-developed, well-nourished, no acute distress. Eyes: Pink conjunctiva, anicteric sclera. Breasts: Palpable  left breast nodule. HEENT: Normocephalic, moist mucous membranes, clear oropharnyx. Lungs: Clear to auscultation bilaterally. Heart: Regular rate and rhythm. No rubs, murmurs, or gallops. Abdomen: Soft, nontender, nondistended. No organomegaly noted, normoactive bowel sounds. Musculoskeletal: No edema, cyanosis, or clubbing. Neuro: Alert, answering all questions appropriately. Cranial nerves grossly intact. Skin: No rashes or petechiae noted. Psych: Normal affect. Lymphatics: No cervical, calvicular, axillary or inguinal LAD.   LAB RESULTS:  Lab Results  Component Value Date   NA 139 03/19/2016   K 4.3 03/19/2016   CL 95 (L) 03/19/2016   CO2 24 03/19/2016   GLUCOSE 97 03/19/2016   BUN 27 03/19/2016   CREATININE 1.04 (H) 03/19/2016   CALCIUM 9.8 03/19/2016   PROT 7.7 03/19/2016   ALBUMIN 4.6 03/19/2016   AST 27 03/19/2016   ALT 30 03/19/2016   ALKPHOS 68 03/19/2016   BILITOT 0.4 03/19/2016   GFRNONAA 53 (L) 03/19/2016   GFRAA 61 03/19/2016    Lab Results  Component Value Date   WBC 7.5 03/19/2016   NEUTROABS 4.9 03/19/2016   HCT 41.4 03/19/2016   MCV 93 03/19/2016   PLT 269 03/19/2016     STUDIES: Mm Digital Screening  Result Date: 02/29/2016 CLINICAL DATA:  Screening. EXAM: DIGITAL SCREENING BILATERAL MAMMOGRAM WITH CAD COMPARISON:  None. ACR Breast Density Category b: There are scattered areas of fibroglandular density. FINDINGS: In the right breast calcifications require further evaluation. In the left breast 2 masses requires further evaluation. Images were processed with CAD. IMPRESSION: Further evaluation is suggested for calcifications in the right breast. Further evaluation is suggested for 2 masses in the left breast. RECOMMENDATION: Diagnostic mammogram and possibly ultrasound of both breasts. (Code:[id number]-[id number]) The patient will be contacted regarding the findings, and additional imaging will be scheduled. BI-RADS CATEGORY  0: Incomplete. Need additional imaging  evaluation and/or prior mammograms for comparison. Electronically Signed   By: Ammie Ferrier M.D.   On: 02/29/2016 15:27   US Breast Complete Uni Left Inc Axilla  Result Date: 03/19/2016 Ultrasound examination of the left breast was done to assess the second area of the breast. The dominant mass at the 1:30 o'clock position, 8 cm from the nipple in the upper outer quadrant of the right breast shows a multilobulated irregular mass measuring 1.99 x 2.12 x 2.4 cm. Dense acoustic shadowing. Area of previous biopsy, BIRAD-5. In the retroareolar area of the left breast at 3:00 position a slightly irregular hypoechoic nodule measuring 0.35 x 0.43 x 0.46 cm were identified. Faint acoustic enhancement was noted. The patient was amenable to FNA sampling and this was completed making use of 1 mL of 1% plain Xylocaine. This yielded a small volume of clear fluid with near complete resolution of the area. Slides 4 were prepared for cytology. At the 3:00 position, 3 cm from the nipple a hypoechoic smoothly marginated mass measuring 0.37 x 0.7 x 0.71 cm with posterior acoustic enhancement was noted. This was aspirated making use of 1 mL of 1% plain Xylocaine with complete resolution. The fluid was discarded. BI-RADS-2. Ultrasound examination of the upper-outer quadrant of the right breast  in the area of clinical tenderness was completed. The only finding was a small bilobed cyst at the 12:00 position, 4 cm from the nipple measuring less than 0.3 cm in maximal diameter. Posterior acoustic enhancement was noted. No internal vascular flow. BIRAD-2.   US Breast Ltd Uni Left Inc Axilla  Result Date: 03/13/2016 CLINICAL DATA:  Left breast mass and right breast calcifications seen on most recent screening mammography. EXAM: 2D DIGITAL DIAGNOSTIC BILATERAL MAMMOGRAM WITH CAD AND ADJUNCT TOMO ULTRASOUND LEFT BREAST COMPARISON:  Previous exam(s). ACR Breast Density Category b: There are scattered areas of fibroglandular  density. FINDINGS: There is a spiculated highly suspicious mass in the left breast upper outer quadrant, posterior depth. Slightly more inferior in the left breast slightly upper outer quadrant, posterior depth there is a second approximately 1 cm isodense to breast parenchyma circumscribed nodule. The calcifications in the right breast upper outer quadrant, posterior depth, noted on the most recent screening mammogram have benign features on compression magnification views. Mammographic images were processed with CAD. On physical exam, there is a moderately firm fixed palpable mass in the left o'clock breast, posterior depth. No skin or nipple changes are noted. Targeted ultrasound is performed, showing hypoechoic irregular highly suspicious mass in the left 2 o'clock breast 8 cm from the nipple which measures 1.7 by 2.4 by 1.7 cm. Additionally in the left breast 3 o'clock 2 cm from the nipple there is a hypoechoic circumscribed horizontally oriented overall benign-appearing nodule measuring 0.7 x 0.8 x 0.5 cm. There is no evidence of lymphadenopathy in the left axilla sonographically. IMPRESSION: Left breast 2 o'clock 8 cm from the nipple highly suspicious 2.4 cm mass, for which ultrasound-guided core needle biopsy is recommended. Left breast 3 o'clock 8 mm nodule with probably benign features. Ultrasound-guided core needle biopsy for this finding may be planned if breast conservation therapy is considered. No evidence of left axillary lymphadenopathy. No evidence of right breast malignancy. RECOMMENDATION: Ultrasound-guided core needle biopsy of the left breast. I have discussed the findings and recommendations with the patient. Results were also provided in writing at the conclusion of the visit. If applicable, a reminder letter will be sent to the patient regarding the next appointment. BI-RADS CATEGORY  5: Highly suggestive of malignancy. Electronically Signed   By: Fidela Salisbury M.D.   On: 03/13/2016  10:31   Mm Diag Breast Tomo Bilateral  Result Date: 03/13/2016 CLINICAL DATA:  Left breast mass and right breast calcifications seen on most recent screening mammography. EXAM: 2D DIGITAL DIAGNOSTIC BILATERAL MAMMOGRAM WITH CAD AND ADJUNCT TOMO ULTRASOUND LEFT BREAST COMPARISON:  Previous exam(s). ACR Breast Density Category b: There are scattered areas of fibroglandular density. FINDINGS: There is a spiculated highly suspicious mass in the left breast upper outer quadrant, posterior depth. Slightly more inferior in the left breast slightly upper outer quadrant, posterior depth there is a second approximately 1 cm isodense to breast parenchyma circumscribed nodule. The calcifications in the right breast upper outer quadrant, posterior depth, noted on the most recent screening mammogram have benign features on compression magnification views. Mammographic images were processed with CAD. On physical exam, there is a moderately firm fixed palpable mass in the left o'clock breast, posterior depth. No skin or nipple changes are noted. Targeted ultrasound is performed, showing hypoechoic irregular highly suspicious mass in the left 2 o'clock breast 8 cm from the nipple which measures 1.7 by 2.4 by 1.7 cm. Additionally in the left breast 3 o'clock 2 cm from the nipple there  is a hypoechoic circumscribed horizontally oriented overall benign-appearing nodule measuring 0.7 x 0.8 x 0.5 cm. There is no evidence of lymphadenopathy in the left axilla sonographically. IMPRESSION: Left breast 2 o'clock 8 cm from the nipple highly suspicious 2.4 cm mass, for which ultrasound-guided core needle biopsy is recommended. Left breast 3 o'clock 8 mm nodule with probably benign features. Ultrasound-guided core needle biopsy for this finding may be planned if breast conservation therapy is considered. No evidence of left axillary lymphadenopathy. No evidence of right breast malignancy. RECOMMENDATION: Ultrasound-guided core needle  biopsy of the left breast. I have discussed the findings and recommendations with the patient. Results were also provided in writing at the conclusion of the visit. If applicable, a reminder letter will be sent to the patient regarding the next appointment. BI-RADS CATEGORY  5: Highly suggestive of malignancy. Electronically Signed   By: Fidela Salisbury M.D.   On: 03/13/2016 10:31   Mm Clip Placement Left  Result Date: 03/13/2016 CLINICAL DATA:  Post ultrasound-guided core needle biopsy of the left breast. EXAM: DIAGNOSTIC LEFT MAMMOGRAM POST ULTRASOUND BIOPSY COMPARISON:  Previous exam(s). FINDINGS: Mammographic images were obtained following ultrasound guided biopsy of left breast 2 o'clock mass. Two-view mammography demonstrates presence of spiral shaped HydroMARK 3 within left breast upper outer quadrant mass. Expected post biopsy changes are seen. IMPRESSION: Successful placement of post biopsy marker within the left breast, post ultrasound-guided biopsy. Final Assessment: Post Procedure Mammograms for Marker Placement Electronically Signed   By: Fidela Salisbury M.D.   On: 03/13/2016 12:56   [place] Lt Breast Bx W Loc Dev 1st Lesion Img Bx Spec US Guide  Addendum Date: 03/19/2016   ADDENDUM REPORT: 03/18/2016 15:20 ADDENDUM: Pathology of the left breast biopsy revealed INVASIVE MAMMARY CARCINOMA WITH MUCINOUS FEATURES. PRELIMINARY GRADE 2. Recommendations: Surgical and oncology referrals. Also recommend an ultrasound guided core biopsy of an additional lesion 3:00 o'clock location if breast conservation treatment is desired. Results and recommendations were discussed with Alma Friendly, NP by Jetta Lout, RRA on 03/17/16. She stated she will contact the patient with results and would like for the nurse navigators at [facility] to make the referral. The patient was contacted by Jetta Lout, [place] on 03/17/16 for a post biopsy check. She stated she has done well following the  biopsy with no bleeding, bruising, or hematoma. Post biopsy instructions were reviewed with the patient. All of her questions were answered. She was encouraged to call the [place] [facility] with any further questions or concerns. The patient stated Alma Friendly, NP had contacted her with the results. She requests a referral be made to see Dr. Bary Castilla. A surgical referral was made with Dr. Bary Castilla on 03/19/16 at 11:00 AM and an oncology referral with Dr. Grayland Ormond on 03/20/16 at 3:30 PM by Tanya Nones, RN, nurse navigator for [facility]. The patient has been notified of the appointments. Addendum by Jetta Lout, RRA on 03/18/16. Electronically Signed   By: Fidela Salisbury M.D.   On: 03/18/2016 15:20   Result Date: 03/19/2016 CLINICAL DATA:  Left breast 2 o'clock mass. EXAM: ULTRASOUND GUIDED LEFT BREAST CORE NEEDLE BIOPSY COMPARISON:  Previous exam(s). FINDINGS: I met with the patient and we discussed the procedure of ultrasound-guided biopsy, including benefits and alternatives. We discussed the high likelihood of a successful procedure. We discussed the risks of the procedure, including infection, bleeding, tissue injury, clip migration, and inadequate sampling. Informed written consent was given. The usual  time-out protocol was performed immediately prior to the procedure. Using sterile technique and 1% Lidocaine as local anesthetic, under direct ultrasound visualization, a 14 gauge spring-loaded device was used to perform biopsy of left breast 2 o'clock mass using a inferior approach. At the conclusion of the procedure a HydroMARK 3 spiral tissue marker clip was deployed into the biopsy cavity. Follow up 2 view mammogram was performed and dictated separately. IMPRESSION: Ultrasound guided biopsy of left breast.  No apparent complications. Electronically Signed: By: Fidela Salisbury M.D. 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  PLAN:    1. 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 FISH is pending for patient's HER-2 status, therefore it is unclear which treatment regimen patient will require. Given the ER/PR status of patient's tumor, she will definitely benefit from an aromatase inhibitor at the conclusion of all her treatments. She will also require adjuvant XRT if she undergoes lumpectomy after completing her neoadjuvant treatment. Patient will get a MUGA scan in preparation for treatment she will also require port placement. Return to clinic in 1 week for chemotherapy class and then in 2 weeks for further evaluation and initiation of cycle 1.  Approximately 45 minutes was spent in discussion of which greater than 50% was consultation.  Patient expressed understanding and was in agreement with this plan. She also understands that She can call clinic at any time with any questions, concerns, or complaints.   Malignant neoplasm of upper-outer quadrant of left female breast [facility])   Staging form: Breast, AJCC 7th Edition   - Clinical stage from 03/19/2016: Stage IIA (T2, N0, M0) - Signed by Lloyd Huger, MD on 03/19/2016  Lloyd Huger, MD   03/23/2016 10:05 AM

## 2016-03-20 ENCOUNTER — Encounter: Payer: Self-pay | Admitting: Oncology

## 2016-03-20 ENCOUNTER — Encounter: Payer: Self-pay | Admitting: *Deleted

## 2016-03-20 ENCOUNTER — Telehealth: Payer: Self-pay | Admitting: *Deleted

## 2016-03-20 ENCOUNTER — Inpatient Hospital Stay: Payer: PPO | Attending: Oncology | Admitting: Oncology

## 2016-03-20 VITALS — BP 146/98 | HR 111 | Temp 97.9°F | Resp 18 | Wt 163.4 lb

## 2016-03-20 DIAGNOSIS — Z87891 Personal history of nicotine dependence: Secondary | ICD-10-CM | POA: Diagnosis not present

## 2016-03-20 DIAGNOSIS — M129 Arthropathy, unspecified: Secondary | ICD-10-CM | POA: Diagnosis not present

## 2016-03-20 DIAGNOSIS — R32 Unspecified urinary incontinence: Secondary | ICD-10-CM

## 2016-03-20 DIAGNOSIS — F419 Anxiety disorder, unspecified: Secondary | ICD-10-CM

## 2016-03-20 DIAGNOSIS — Z79899 Other long term (current) drug therapy: Secondary | ICD-10-CM | POA: Diagnosis not present

## 2016-03-20 DIAGNOSIS — Z803 Family history of malignant neoplasm of breast: Secondary | ICD-10-CM | POA: Diagnosis not present

## 2016-03-20 DIAGNOSIS — Z5111 Encounter for antineoplastic chemotherapy: Secondary | ICD-10-CM | POA: Diagnosis not present

## 2016-03-20 DIAGNOSIS — C50412 Malignant neoplasm of upper-outer quadrant of left female breast: Secondary | ICD-10-CM

## 2016-03-20 DIAGNOSIS — Z170 Estrogen receptor positive status [ER+]: Secondary | ICD-10-CM | POA: Diagnosis not present

## 2016-03-20 DIAGNOSIS — Z85828 Personal history of other malignant neoplasm of skin: Secondary | ICD-10-CM | POA: Diagnosis not present

## 2016-03-20 LAB — CBC WITH DIFFERENTIAL/PLATELET
BASOS: 0 %
Basophils Absolute: 0 10*3/uL (ref 0.0–0.2)
EOS (ABSOLUTE): 0.1 10*3/uL (ref 0.0–0.4)
EOS: 1 %
HEMATOCRIT: 41.4 % (ref 34.0–46.6)
HEMOGLOBIN: 13.6 g/dL (ref 11.1–15.9)
Immature Grans (Abs): 0 10*3/uL (ref 0.0–0.1)
Immature Granulocytes: 0 %
LYMPHS ABS: 2.1 10*3/uL (ref 0.7–3.1)
Lymphs: 28 %
MCH: 30.4 pg (ref 26.6–33.0)
MCHC: 32.9 g/dL (ref 31.5–35.7)
MCV: 93 fL (ref 79–97)
MONOCYTES: 6 %
MONOS ABS: 0.4 10*3/uL (ref 0.1–0.9)
Neutrophils Absolute: 4.9 10*3/uL (ref 1.4–7.0)
Neutrophils: 65 %
Platelets: 269 10*3/uL (ref 150–379)
RBC: 4.47 x10E6/uL (ref 3.77–5.28)
RDW: 13.6 % (ref 12.3–15.4)
WBC: 7.5 10*3/uL (ref 3.4–10.8)

## 2016-03-20 LAB — COMPREHENSIVE METABOLIC PANEL
ALBUMIN: 4.6 g/dL (ref 3.5–4.8)
ALK PHOS: 68 IU/L (ref 39–117)
ALT: 30 IU/L (ref 0–32)
AST: 27 IU/L (ref 0–40)
Albumin/Globulin Ratio: 1.5 (ref 1.2–2.2)
BUN / CREAT RATIO: 26 (ref 12–28)
BUN: 27 mg/dL (ref 8–27)
Bilirubin Total: 0.4 mg/dL (ref 0.0–1.2)
CO2: 24 mmol/L (ref 18–29)
CREATININE: 1.04 mg/dL — AB (ref 0.57–1.00)
Calcium: 9.8 mg/dL (ref 8.7–10.3)
Chloride: 95 mmol/L — ABNORMAL LOW (ref 96–106)
GFR calc Af Amer: 61 mL/min/{1.73_m2} (ref >59)
GFR, EST NON AFRICAN AMERICAN: 53 mL/min/{1.73_m2} — AB (ref >59)
GLOBULIN, TOTAL: 3.1 g/dL (ref 1.5–4.5)
GLUCOSE: 97 mg/dL (ref 65–99)
Potassium: 4.3 mmol/L (ref 3.5–5.2)
SODIUM: 139 mmol/L (ref 134–144)
TOTAL PROTEIN: 7.7 g/dL (ref 6.0–8.5)

## 2016-03-20 LAB — CANCER ANTIGEN 27.29: CA 27.29: 21.9 U/mL (ref 0.0–38.6)

## 2016-03-20 LAB — CEA: CEA: 3.6 ng/mL (ref 0.0–4.7)

## 2016-03-20 NOTE — Telephone Encounter (Signed)
Patient's right power port placement has been scheduled for 04-01-16 at [facility].

## 2016-03-20 NOTE — Progress Notes (Signed)
  Oncology Nurse Navigator Documentation  Navigator Location: [facility]-Med Onc (03/20/16 1600) Referral date to RadOnc/MedOnc: 03/20/16 (03/20/16 1600) )Navigator Encounter Type: Initial MedOnc (03/20/16 1600)    Met patient today during her initial medical oncology visit with Dr. Grayland Ormond.  He discussed neoadjuvant chemo with the patient.  She had a great attitude and is ready to get started with treatment.  Left patient breast cancer educational literature, "My Breast Cancer Treatment Handbook" by Josephine Igo, RN at Dr. Dwyane Luo office.  States she received her information.  No needs at this time.  She is to call with any questions or needs.    Patient Visit Type: MedOnc (03/20/16 1600) Treatment Phase: Pre-Tx/Tx Discussion (03/20/16 1600) Barriers/Navigation Needs: Education (03/20/16 1600) Education: Understanding Cancer/ Treatment Options;Newly Diagnosed Cancer Education (03/20/16 1600) Interventions: Education (03/20/16 1600)     Education Method: Written (03/20/16 1600)      Acuity: Level 2 (03/20/16 1600)         Time Spent with Patient: 60 (03/20/16 1600)

## 2016-03-20 NOTE — Progress Notes (Signed)
New evaluation for breast cancer. States is feeling nervous and anxious about appt today. Complains of tenderness to left breast near biopsy site.

## 2016-03-20 NOTE — Patient Instructions (Signed)
Spoke with Dr. Dwyane Luo office regarding appointment for port placement, Dr. Dwyane Luo office to call patient with appointment. Office is aware patient is to begin chemotherapy on 04/03/16. Referral entered in [facility], port insertion orders faxed.

## 2016-03-21 ENCOUNTER — Other Ambulatory Visit: Payer: Self-pay | Admitting: *Deleted

## 2016-03-21 ENCOUNTER — Telehealth: Payer: Self-pay | Admitting: General Surgery

## 2016-03-21 ENCOUNTER — Telehealth: Payer: Self-pay | Admitting: Oncology

## 2016-03-21 ENCOUNTER — Other Ambulatory Visit: Payer: Self-pay | Admitting: General Surgery

## 2016-03-21 ENCOUNTER — Telehealth: Payer: Self-pay

## 2016-03-21 DIAGNOSIS — Z170 Estrogen receptor positive status [ER+]: Principal | ICD-10-CM

## 2016-03-21 DIAGNOSIS — C50412 Malignant neoplasm of upper-outer quadrant of left female breast: Secondary | ICD-10-CM

## 2016-03-21 MED ORDER — LIDOCAINE-PRILOCAINE 2.5-2.5 % EX CREA: 1.0000 "application " | TOPICAL_CREAM | CUTANEOUS | 2 refills | Status: DC | PRN

## 2016-03-21 NOTE — Telephone Encounter (Signed)
Contacted the patient to review the results of her FNA which were fine.  She met with Dr. Grayland Ormond for medical oncology and he agrees that neoadjuvant chemotherapy is appropriate.  The role for a PowerPort for chemotherapy administration was discussed, and the risks related to the procedure including pneumothorax reviewed.  Patient is present scheduled for the procedure on 04/01/2016.

## 2016-03-21 NOTE — Telephone Encounter (Signed)
Prescription for EMLA cream will be sent to patients pharmacy.

## 2016-03-21 NOTE — Telephone Encounter (Signed)
Notified patient as instructed, patient pleased. Discussed follow-up appointments, patient agrees  

## 2016-03-21 NOTE — Telephone Encounter (Signed)
Dr. Bary Castilla spoke with pt and she mentioned that she learned about Emla cream from a friend but did not receive a Rx for it. Dr. Bary Castilla wants to ensure that she gets a Rx for the cream.

## 2016-03-21 NOTE — Telephone Encounter (Signed)
-----   Message from [name], MD sent at 03/20/2016  7:48 AM EST ----- Notify patient labs fine. Thanks. She has an appt today w/ Dr. Grayland Ormond.  ----- Message ----- From: Interface, [facility] Lab Results In Sent: 03/20/2016   7:38 AM To: [name], MD

## 2016-03-24 LAB — SURGICAL PATHOLOGY

## 2016-03-25 NOTE — Patient Instructions (Signed)
Trastuzumab injection for infusion What is this medicine? TRASTUZUMAB (tras TOO zoo mab) is a monoclonal antibody. It is used to treat breast cancer and stomach cancer. This medicine may be used for other purposes; ask your health care provider or pharmacist if you have questions. COMMON BRAND NAME(S): Herceptin What should I tell my health care provider before I take this medicine? They need to know if you have any of these conditions: -heart disease -heart failure -infection (especially a virus infection such as chickenpox, cold sores, or herpes) -lung or breathing disease, like asthma -recent or ongoing radiation therapy -an unusual or allergic reaction to trastuzumab, benzyl alcohol, or other medications, foods, dyes, or preservatives -pregnant or trying to get pregnant -breast-feeding How should I use this medicine? This drug is given as an infusion into a vein. It is administered in a hospital or clinic by a specially trained health care professional. Talk to your pediatrician regarding the use of this medicine in children. This medicine is not approved for use in children. Overdosage: If you think you have taken too much of this medicine contact a poison control center or emergency room at once. NOTE: This medicine is only for you. Do not share this medicine with others. What if I miss a dose? It is important not to miss a dose. Call your doctor or health care professional if you are unable to keep an appointment. What may interact with this medicine? -doxorubicin -warfarin This list may not describe all possible interactions. Give your health care provider a list of all the medicines, herbs, non-prescription drugs, or dietary supplements you use. Also tell them if you smoke, drink alcohol, or use illegal drugs. Some items may interact with your medicine. What should I watch for while using this medicine? Visit your doctor for checks on your progress. Report any side effects.  Continue your course of treatment even though you feel ill unless your doctor tells you to stop. Call your doctor or health care professional for advice if you get a fever, chills or sore throat, or other symptoms of a cold or flu. Do not treat yourself. Try to avoid being around people who are sick. You may experience fever, chills and shaking during your first infusion. These effects are usually mild and can be treated with other medicines. Report any side effects during the infusion to your health care professional. Fever and chills usually do not happen with later infusions. Do not become pregnant while taking this medicine or for 7 months after stopping it. Women should inform their doctor if they wish to become pregnant or think they might be pregnant. Women of child-bearing potential will need to have a negative pregnancy test before starting this medicine. There is a potential for serious side effects to an unborn child. Talk to your health care professional or pharmacist for more information. Do not breast-feed an infant while taking this medicine or for 7 months after stopping it. Women must use effective birth control with this medicine. What side effects may I notice from receiving this medicine? Side effects that you should report to your doctor or health care professional as soon as possible: -breathing difficulties -chest pain or palpitations -cough -dizziness or fainting -fever or chills, sore throat -skin rash, itching or hives -swelling of the legs or ankles -unusually weak or tired Side effects that usually do not require medical attention (report to your doctor or health care professional if they continue or are bothersome): -loss of appetite -headache -muscle aches -  nausea This list may not describe all possible side effects. Call your doctor for medical advice about side effects. You may report side effects to FDA at 1-800-FDA-1088. Where should I keep my medicine? This  drug is given in a hospital or clinic and will not be stored at home. NOTE: This sheet is a summary. It may not cover all possible information. If you have questions about this medicine, talk to your doctor, pharmacist, or health care provider.  2017 Elsevier/Gold Standard (2015-05-02 17:16:44) Docetaxel injection What is this medicine? DOCETAXEL (doe se TAX el) is a chemotherapy drug. It targets fast dividing cells, like cancer cells, and causes these cells to die. This medicine is used to treat many types of cancers like breast cancer, certain stomach cancers, head and neck cancer, lung cancer, and prostate cancer. This medicine may be used for other purposes; ask your health care provider or pharmacist if you have questions. COMMON BRAND NAME(S): Docefrez, Taxotere What should I tell my health care provider before I take this medicine? They need to know if you have any of these conditions: -infection (especially a virus infection such as chickenpox, cold sores, or herpes) -liver disease -low blood counts, like low white cell, platelet, or red cell counts -an unusual or allergic reaction to docetaxel, polysorbate 80, other chemotherapy agents, other medicines, foods, dyes, or preservatives -pregnant or trying to get pregnant -breast-feeding How should I use this medicine? This drug is given as an infusion into a vein. It is administered in a hospital or clinic by a specially trained health care professional. Talk to your pediatrician regarding the use of this medicine in children. Special care may be needed. Overdosage: If you think you have taken too much of this medicine contact a poison control center or emergency room at once. NOTE: This medicine is only for you. Do not share this medicine with others. What if I miss a dose? It is important not to miss your dose. Call your doctor or health care professional if you are unable to keep an appointment. What may interact with this  medicine? -cyclosporine -erythromycin -ketoconazole -medicines to increase blood counts like filgrastim, pegfilgrastim, sargramostim -vaccines Talk to your doctor or health care professional before taking any of these medicines: -acetaminophen -aspirin -ibuprofen -ketoprofen -naproxen This list may not describe all possible interactions. Give your health care provider a list of all the medicines, herbs, non-prescription drugs, or dietary supplements you use. Also tell them if you smoke, drink alcohol, or use illegal drugs. Some items may interact with your medicine. What should I watch for while using this medicine? Your condition will be monitored carefully while you are receiving this medicine. You will need important blood work done while you are taking this medicine. This drug may make you feel generally unwell. This is not uncommon, as chemotherapy can affect healthy cells as well as cancer cells. Report any side effects. Continue your course of treatment even though you feel ill unless your doctor tells you to stop. In some cases, you may be given additional medicines to help with side effects. Follow all directions for their use. Call your doctor or health care professional for advice if you get a fever, chills or sore throat, or other symptoms of a cold or flu. Do not treat yourself. This drug decreases your body's ability to fight infections. Try to avoid being around people who are sick. This medicine may increase your risk to bruise or bleed. Call your doctor or health care professional  if you notice any unusual bleeding. This medicine may contain alcohol in the product. You may get drowsy or dizzy. Do not drive, use machinery, or do anything that needs mental alertness until you know how this medicine affects you. Do not stand or sit up quickly, especially if you are an older patient. This reduces the risk of dizzy or fainting spells. Avoid alcoholic drinks. Do not become pregnant  while taking this medicine. Women should inform their doctor if they wish to become pregnant or think they might be pregnant. There is a potential for serious side effects to an unborn child. Talk to your health care professional or pharmacist for more information. Do not breast-feed an infant while taking this medicine. What side effects may I notice from receiving this medicine? Side effects that you should report to your doctor or health care professional as soon as possible: -allergic reactions like skin rash, itching or hives, swelling of the face, lips, or tongue -low blood counts - This drug may decrease the number of white blood cells, red blood cells and platelets. You may be at increased risk for infections and bleeding. -signs of infection - fever or chills, cough, sore throat, pain or difficulty passing urine -signs of decreased platelets or bleeding - bruising, pinpoint red spots on the skin, black, tarry stools, nosebleeds -signs of decreased red blood cells - unusually weak or tired, fainting spells, lightheadedness -breathing problems -fast or irregular heartbeat -low blood pressure -mouth sores -nausea and vomiting -pain, swelling, redness or irritation at the injection site -pain, tingling, numbness in the hands or feet -swelling of the ankle, feet, hands -weight gain Side effects that usually do not require medical attention (report to your doctor or health care professional if they continue or are bothersome): -bone pain -complete hair loss including hair on your head, underarms, pubic hair, eyebrows, and eyelashes -diarrhea -excessive tearing -changes in the color of fingernails -loosening of the fingernails -nausea -muscle pain -red flush to skin -sweating -weak or tired This list may not describe all possible side effects. Call your doctor for medical advice about side effects. You may report side effects to FDA at 1-800-FDA-1088. Where should I keep my  medicine? This drug is given in a hospital or clinic and will not be stored at home. NOTE: This sheet is a summary. It may not cover all possible information. If you have questions about this medicine, talk to your doctor, pharmacist, or health care provider.  2017 Elsevier/Gold Standard (2015-05-03 12:32:56) Carboplatin injection What is this medicine? CARBOPLATIN (KAR boe pla tin) is a chemotherapy drug. It targets fast dividing cells, like cancer cells, and causes these cells to die. This medicine is used to treat ovarian cancer and many other cancers. This medicine may be used for other purposes; ask your health care provider or pharmacist if you have questions. COMMON BRAND NAME(S): Paraplatin What should I tell my health care provider before I take this medicine? They need to know if you have any of these conditions: -blood disorders -hearing problems -kidney disease -recent or ongoing radiation therapy -an unusual or allergic reaction to carboplatin, cisplatin, other chemotherapy, other medicines, foods, dyes, or preservatives -pregnant or trying to get pregnant -breast-feeding How should I use this medicine? This drug is usually given as an infusion into a vein. It is administered in a hospital or clinic by a specially trained health care professional. Talk to your pediatrician regarding the use of this medicine in children. Special care may be needed.  Overdosage: If you think you have taken too much of this medicine contact a poison control center or emergency room at once. NOTE: This medicine is only for you. Do not share this medicine with others. What if I miss a dose? It is important not to miss a dose. Call your doctor or health care professional if you are unable to keep an appointment. What may interact with this medicine? -medicines for seizures -medicines to increase blood counts like filgrastim, pegfilgrastim, sargramostim -some antibiotics like amikacin, gentamicin,  neomycin, streptomycin, tobramycin -vaccines Talk to your doctor or health care professional before taking any of these medicines: -acetaminophen -aspirin -ibuprofen -ketoprofen -naproxen This list may not describe all possible interactions. Give your health care provider a list of all the medicines, herbs, non-prescription drugs, or dietary supplements you use. Also tell them if you smoke, drink alcohol, or use illegal drugs. Some items may interact with your medicine. What should I watch for while using this medicine? Your condition will be monitored carefully while you are receiving this medicine. You will need important blood work done while you are taking this medicine. This drug may make you feel generally unwell. This is not uncommon, as chemotherapy can affect healthy cells as well as cancer cells. Report any side effects. Continue your course of treatment even though you feel ill unless your doctor tells you to stop. In some cases, you may be given additional medicines to help with side effects. Follow all directions for their use. Call your doctor or health care professional for advice if you get a fever, chills or sore throat, or other symptoms of a cold or flu. Do not treat yourself. This drug decreases your body's ability to fight infections. Try to avoid being around people who are sick. This medicine may increase your risk to bruise or bleed. Call your doctor or health care professional if you notice any unusual bleeding. Be careful brushing and flossing your teeth or using a toothpick because you may get an infection or bleed more easily. If you have any dental work done, tell your dentist you are receiving this medicine. Avoid taking products that contain aspirin, acetaminophen, ibuprofen, naproxen, or ketoprofen unless instructed by your doctor. These medicines may hide a fever. Do not become pregnant while taking this medicine. Women should inform their doctor if they wish to  become pregnant or think they might be pregnant. There is a potential for serious side effects to an unborn child. Talk to your health care professional or pharmacist for more information. Do not breast-feed an infant while taking this medicine. What side effects may I notice from receiving this medicine? Side effects that you should report to your doctor or health care professional as soon as possible: -allergic reactions like skin rash, itching or hives, swelling of the face, lips, or tongue -signs of infection - fever or chills, cough, sore throat, pain or difficulty passing urine -signs of decreased platelets or bleeding - bruising, pinpoint red spots on the skin, black, tarry stools, nosebleeds -signs of decreased red blood cells - unusually weak or tired, fainting spells, lightheadedness -breathing problems -changes in hearing -changes in vision -chest pain -high blood pressure -low blood counts - This drug may decrease the number of white blood cells, red blood cells and platelets. You may be at increased risk for infections and bleeding. -nausea and vomiting -pain, swelling, redness or irritation at the injection site -pain, tingling, numbness in the hands or feet -problems with balance, talking, walking -trouble  passing urine or change in the amount of urine Side effects that usually do not require medical attention (report to your doctor or health care professional if they continue or are bothersome): -hair loss -loss of appetite -metallic taste in the mouth or changes in taste This list may not describe all possible side effects. Call your doctor for medical advice about side effects. You may report side effects to FDA at 1-800-FDA-1088. Where should I keep my medicine? This drug is given in a hospital or clinic and will not be stored at home. NOTE: This sheet is a summary. It may not cover all possible information. If you have questions about this medicine, talk to your doctor,  pharmacist, or health care provider.  2017 Elsevier/Gold Standard (2007-07-06 14:38:05)

## 2016-03-26 ENCOUNTER — Encounter
Admission: RE | Admit: 2016-03-26 | Discharge: 2016-03-26 | Disposition: A | Discharge status: Home / Self Care | Payer: PPO | Source: Ambulatory Visit | Attending: Oncology | Admitting: Oncology

## 2016-03-26 DIAGNOSIS — C50919 Malignant neoplasm of unspecified site of unspecified female breast: Secondary | ICD-10-CM | POA: Diagnosis not present

## 2016-03-26 DIAGNOSIS — T451X5A Adverse effect of antineoplastic and immunosuppressive drugs, initial encounter: Secondary | ICD-10-CM | POA: Diagnosis not present

## 2016-03-26 DIAGNOSIS — Z170 Estrogen receptor positive status [ER+]: Secondary | ICD-10-CM | POA: Diagnosis not present

## 2016-03-26 DIAGNOSIS — C50412 Malignant neoplasm of upper-outer quadrant of left female breast: Secondary | ICD-10-CM | POA: Diagnosis not present

## 2016-03-26 MED ORDER — TECHNETIUM TC 99M-LABELED RED BLOOD CELLS IV KIT
24.4700 | PACK | Freq: Once | INTRAVENOUS | Status: AC | PRN
  Administered 2016-03-26: 24.47 via INTRAVENOUS

## 2016-03-26 NOTE — Patient Instructions (Signed)
  Your procedure is scheduled on: 04-01-16 (TUESDAY) Report to Same Day Surgery 2nd floor medical mall [facility] Entrance-take elevator on left to 2nd floor.  Check in with surgery information desk.) To find out your arrival time please call [phone] between 1PM - 3PM on 03-31-16 [facility])  Remember: Instructions that are not followed completely may result in serious medical risk, up to and including death, or upon the discretion of your surgeon and anesthesiologist your surgery may need to be rescheduled.    _x___ 1. Do not eat food or drink liquids after midnight. No gum chewing or hard candies.     __x__ 2. No Alcohol for 24 hours before or after surgery.   __x__3. No Smoking for 24 prior to surgery.   ____  4. Bring all medications with you on the day of surgery if instructed.    __x__ 5. Notify your doctor if there is any change in your medical condition     (cold, fever, infections).     Do not wear jewelry, make-up, hairpins, clips or nail polish.  Do not wear lotions, powders, or perfumes. You may wear deodorant.  Do not shave 48 hours prior to surgery. Men may shave face and neck.  Do not bring valuables to the hospital.    [facility] is not responsible for any belongings or valuables.               Contacts, dentures or bridgework may not be worn into surgery.  Leave your suitcase in the car. After surgery it may be brought to your room.  For patients admitted to the hospital, discharge time is determined by your treatment team.   Patients discharged the day of surgery will not be allowed to drive home.  You will need someone to drive you home and stay with you the night of your procedure.    Please read over the following fact sheets that you were given:   [facility] Preparing for Surgery and or MRSA Information   ____ Take these medicines the morning of surgery with A SIP OF WATER:    1. NONE  2.  3.  4.  5.  6.  ____Fleets enema or Magnesium Citrate as  directed.   _x___ Use CHG Soap or sage wipes as directed on instruction sheet   ____ Use inhalers on the day of surgery and bring to hospital day of surgery  ____ Stop metformin 2 days prior to surgery    ____ Take 1/2 of usual insulin dose the night before surgery and none on the morning of surgery.   ____ Stop Aspirin, Coumadin, Pllavix ,Eliquis, Effient, or Pradaxa  __ Stop Anti-inflammatories such as Advil, Aleve, Ibuprofen, Motrin, Naproxen,          Naprosyn, Goodies powders or aspirin products. Ok to take Tylenol.   ____ Stop supplements until after surgery.    ____ Bring C-Pap to the hospital.

## 2016-03-27 ENCOUNTER — Encounter
Admission: RE | Admit: 2016-03-27 | Discharge: 2016-03-27 | Disposition: A | Discharge status: Home / Self Care | Payer: PPO | Source: Ambulatory Visit | Attending: General Surgery | Admitting: General Surgery

## 2016-03-27 ENCOUNTER — Inpatient Hospital Stay: Payer: PPO

## 2016-03-27 DIAGNOSIS — I10 Essential (primary) hypertension: Secondary | ICD-10-CM | POA: Insufficient documentation

## 2016-03-27 HISTORY — DX: Essential (primary) hypertension: I10

## 2016-03-27 HISTORY — DX: Other complications of anesthesia, initial encounter: T88.59XA

## 2016-03-27 HISTORY — DX: Other specified postprocedural states: Z98.890

## 2016-03-27 HISTORY — DX: Gastro-esophageal reflux disease without esophagitis: K21.9

## 2016-03-27 HISTORY — DX: Adverse effect of unspecified anesthetic, initial encounter: T41.45XA

## 2016-03-27 HISTORY — DX: Nausea with vomiting, unspecified: R11.2

## 2016-03-30 ENCOUNTER — Other Ambulatory Visit: Payer: Self-pay | Admitting: Oncology

## 2016-03-30 DIAGNOSIS — C50412 Malignant neoplasm of upper-outer quadrant of left female breast: Secondary | ICD-10-CM

## 2016-03-30 DIAGNOSIS — Z170 Estrogen receptor positive status [ER+]: Principal | ICD-10-CM

## 2016-03-30 MED ORDER — LIDOCAINE-PRILOCAINE 2.5-2.5 % EX CREA: TOPICAL_CREAM | CUTANEOUS | 3 refills | Status: DC

## 2016-03-30 MED ORDER — PROCHLORPERAZINE MALEATE 10 MG PO TABS: 10.0000 mg | ORAL_TABLET | Freq: Four times a day (QID) | ORAL | 1 refills | Status: DC | PRN

## 2016-03-30 MED ORDER — ONDANSETRON HCL 8 MG PO TABS: 8.0000 mg | ORAL_TABLET | Freq: Two times a day (BID) | ORAL | 1 refills | Status: DC | PRN

## 2016-03-30 NOTE — Progress Notes (Signed)
START ON PATHWAY REGIMEN - Breast  BOS174: TC - Docetaxel, Cyclophosphamide q21 Days x 4 Cycles   A cycle is every 21 days:     Docetaxel (Taxotere(R)) 75 mg/m2 in 250 mL NS IV over 1 hour, q21 days; followed by Dose Mod: None     Cyclophosphamide (Cytoxan(R)) 600 mg/m2 in 250 mL NS IV over 30 minutes, q21 days Dose Mod: None Additional Orders: Premedicate with dexamethasone 8 mg PO bid for three days beginning 1 day prior to therapy  **Always confirm dose/schedule in your pharmacy ordering system**    Patient Characteristics: Neoadjuvant Chemotherapy, HER2/neu Negative/Unknown/Equivocal, ER Positive AJCC Stage Grouping: IIA Current Disease Status: No Distant Mets or Local Recurrence AJCC M Stage: X ER Status: Positive (+) AJCC N Stage: X AJCC T Stage: X HER2/neu: Negative (-) PR Status: Positive (+)  Intent of Therapy: Curative Intent, Discussed with Patient

## 2016-03-31 ENCOUNTER — Other Ambulatory Visit: Payer: Self-pay | Admitting: *Deleted

## 2016-03-31 ENCOUNTER — Encounter: Payer: Self-pay | Admitting: *Deleted

## 2016-03-31 NOTE — Telephone Encounter (Signed)
was done yesterday.

## 2016-04-01 ENCOUNTER — Encounter
Admission: RE | Disposition: A | Discharge status: Home / Self Care | Payer: Self-pay | Source: Ambulatory Visit | Attending: General Surgery

## 2016-04-01 ENCOUNTER — Ambulatory Visit: Payer: PPO | Admitting: Anesthesiology

## 2016-04-01 ENCOUNTER — Ambulatory Visit: Payer: PPO

## 2016-04-01 ENCOUNTER — Ambulatory Visit
Admission: RE | Admit: 2016-04-01 | Discharge: 2016-04-01 | Disposition: A | Discharge status: Home / Self Care | Payer: PPO | Source: Ambulatory Visit | Attending: General Surgery | Admitting: General Surgery

## 2016-04-01 DIAGNOSIS — I10 Essential (primary) hypertension: Secondary | ICD-10-CM | POA: Insufficient documentation

## 2016-04-01 DIAGNOSIS — Z452 Encounter for adjustment and management of vascular access device: Secondary | ICD-10-CM | POA: Diagnosis not present

## 2016-04-01 DIAGNOSIS — K219 Gastro-esophageal reflux disease without esophagitis: Secondary | ICD-10-CM | POA: Insufficient documentation

## 2016-04-01 DIAGNOSIS — Z87891 Personal history of nicotine dependence: Secondary | ICD-10-CM | POA: Diagnosis not present

## 2016-04-01 DIAGNOSIS — C50412 Malignant neoplasm of upper-outer quadrant of left female breast: Secondary | ICD-10-CM | POA: Diagnosis not present

## 2016-04-01 DIAGNOSIS — C50912 Malignant neoplasm of unspecified site of left female breast: Secondary | ICD-10-CM | POA: Insufficient documentation

## 2016-04-01 DIAGNOSIS — Z170 Estrogen receptor positive status [ER+]: Secondary | ICD-10-CM

## 2016-04-01 DIAGNOSIS — C50919 Malignant neoplasm of unspecified site of unspecified female breast: Secondary | ICD-10-CM | POA: Diagnosis not present

## 2016-04-01 DIAGNOSIS — Z95828 Presence of other vascular implants and grafts: Secondary | ICD-10-CM

## 2016-04-01 HISTORY — DX: Complex regional pain syndrome I, unspecified: G90.50

## 2016-04-01 HISTORY — PX: PORTACATH PLACEMENT: SHX2246

## 2016-04-01 SURGERY — INSERTION, TUNNELED CENTRAL VENOUS DEVICE, WITH PORT
Anesthesia: General | Wound class: Clean

## 2016-04-01 MED ORDER — PROPOFOL 500 MG/50ML IV EMUL
INTRAVENOUS | Status: DC | PRN
  Administered 2016-04-01: 75 ug/kg/min via INTRAVENOUS

## 2016-04-01 MED ORDER — MIDAZOLAM HCL 2 MG/2ML IJ SOLN
INTRAMUSCULAR | Status: DC | PRN
  Administered 2016-04-01: 2 mg via INTRAVENOUS

## 2016-04-01 MED ORDER — LACTATED RINGERS IV SOLN
INTRAVENOUS | Status: DC
  Administered 2016-04-01: 1000 mL via INTRAVENOUS

## 2016-04-01 MED ORDER — PROPOFOL 10 MG/ML IV BOLUS
INTRAVENOUS | Status: DC | PRN
  Administered 2016-04-01: 20 mg via INTRAVENOUS
  Administered 2016-04-01: 10 mg via INTRAVENOUS

## 2016-04-01 MED ORDER — ONDANSETRON HCL 4 MG/2ML IJ SOLN: 4.0000 mg | Freq: Once | INTRAMUSCULAR | Status: DC | PRN

## 2016-04-01 MED ORDER — FAMOTIDINE 20 MG PO TABS
ORAL_TABLET | ORAL | Status: AC
  Filled 2016-04-01: qty 1

## 2016-04-01 MED ORDER — FAMOTIDINE 20 MG PO TABS
20.0000 mg | ORAL_TABLET | Freq: Once | ORAL | Status: AC
  Administered 2016-04-01: 20 mg via ORAL

## 2016-04-01 MED ORDER — FENTANYL CITRATE (PF) 100 MCG/2ML IJ SOLN: 25.0000 ug | INTRAMUSCULAR | Status: DC | PRN

## 2016-04-01 MED ORDER — CEFAZOLIN SODIUM-DEXTROSE 2-4 GM/100ML-% IV SOLN
2.0000 g | INTRAVENOUS | Status: AC
  Administered 2016-04-01: 2 g via INTRAVENOUS

## 2016-04-01 MED ORDER — CEFAZOLIN SODIUM-DEXTROSE 2-4 GM/100ML-% IV SOLN
INTRAVENOUS | Status: AC
  Filled 2016-04-01: qty 100

## 2016-04-01 MED ORDER — SODIUM CHLORIDE 0.9 % IJ SOLN
INTRAMUSCULAR | Status: DC | PRN
  Administered 2016-04-01: 20 mL

## 2016-04-01 MED ORDER — SODIUM CHLORIDE 0.9 % IJ SOLN
INTRAMUSCULAR | Status: AC
  Filled 2016-04-01: qty 50

## 2016-04-01 MED ORDER — LIDOCAINE HCL (PF) 1 % IJ SOLN
INTRAMUSCULAR | Status: DC | PRN
  Administered 2016-04-01: 10 mL

## 2016-04-01 MED ORDER — LIDOCAINE HCL (PF) 1 % IJ SOLN
INTRAMUSCULAR | Status: AC
  Filled 2016-04-01: qty 30

## 2016-04-01 MED ORDER — FENTANYL CITRATE (PF) 100 MCG/2ML IJ SOLN
INTRAMUSCULAR | Status: DC | PRN
  Administered 2016-04-01: 50 ug via INTRAVENOUS

## 2016-04-01 SURGICAL SUPPLY — 32 items
BLADE SURG 15 STRL SS SAFETY (BLADE) ×2 IMPLANT
CHLORAPREP W/TINT 26ML (MISCELLANEOUS) ×2 IMPLANT
COVER LIGHT HANDLE STERIS (MISCELLANEOUS) ×4 IMPLANT
DECANTER SPIKE VIAL GLASS SM (MISCELLANEOUS) ×4 IMPLANT
DRAPE C-ARM XRAY 36X54 (DRAPES) ×2 IMPLANT
DRAPE LAPAROTOMY TRNSV 106X77 (MISCELLANEOUS) ×2 IMPLANT
DRESSING TELFA 4X3 1S ST N-ADH (GAUZE/BANDAGES/DRESSINGS) ×2 IMPLANT
DRSG TEGADERM 2-3/8X2-3/4 SM (GAUZE/BANDAGES/DRESSINGS) ×2 IMPLANT
DRSG TEGADERM 4X4.75 (GAUZE/BANDAGES/DRESSINGS) ×2 IMPLANT
ELECT REM PT RETURN 9FT ADLT (ELECTROSURGICAL) ×2
ELECTRODE REM PT RTRN 9FT ADLT (ELECTROSURGICAL) ×1 IMPLANT
GLOVE BIO SURGEON STRL SZ7.5 (GLOVE) ×6 IMPLANT
GLOVE INDICATOR 8.0 STRL GRN (GLOVE) ×4 IMPLANT
GOWN STRL REUS W/ TWL LRG LVL3 (GOWN DISPOSABLE) ×2 IMPLANT
GOWN STRL REUS W/TWL LRG LVL3 (GOWN DISPOSABLE) ×2
KIT PORT POWER 8FR ISP CVUE (Catheter) ×2 IMPLANT
KIT RM TURNOVER STRD PROC AR (KITS) ×2 IMPLANT
LABEL OR SOLS (LABEL) ×2 IMPLANT
NS IRRIG 500ML POUR BTL (IV SOLUTION) ×2 IMPLANT
PACK PORT-A-CATH (MISCELLANEOUS) ×2 IMPLANT
STRIP CLOSURE SKIN 1/2X4 (GAUZE/BANDAGES/DRESSINGS) ×2 IMPLANT
SUT MNCRL 3-0 VIOLET CT-1 (SUTURE) ×1 IMPLANT
SUT MNCRL 4-0 (SUTURE) ×1
SUT MNCRL 4-0 27XMFL (SUTURE) ×1
SUT MONOCRYL 3-0 (SUTURE) ×1
SUT PROLENE 3 0 SH DA (SUTURE) ×2 IMPLANT
SUT VIC AB 3-0 SH 27 (SUTURE)
SUT VIC AB 3-0 SH 27X BRD (SUTURE) IMPLANT
SUT VIC AB 4-0 FS2 27 (SUTURE) IMPLANT
SUTURE MNCRL 4-0 27XMF (SUTURE) ×1 IMPLANT
SWABSTK COMLB BENZOIN TINCTURE (MISCELLANEOUS) ×2 IMPLANT
SYR 10ML SLIP (SYRINGE) ×2 IMPLANT

## 2016-04-01 NOTE — Transfer of Care (Signed)
Immediate Anesthesia Transfer of Care Note  Patient: Catherine Tate  Procedure(s) Performed: Procedure(s): INSERTION PORT-A-CATH (N/A)  Patient Location: PACU  Anesthesia Type:MAC  Level of Consciousness: awake  Airway & Oxygen Therapy: Patient Spontanous Breathing and Patient connected to nasal cannula oxygen  Post-op Assessment: Report given to RN and Post -op Vital signs reviewed and stable  Post vital signs: Reviewed and stable  Last Vitals:  Vitals:   04/01/16 0754 04/01/16 0949  BP: (!) 156/87 136/78  Pulse: 94 92  Resp: 16 (!) 27  Temp: 36.7 C 36.2 C    Last Pain:  Vitals:   04/01/16 0754  TempSrc: Oral         Complications: No apparent anesthesia complications

## 2016-04-01 NOTE — Anesthesia Postprocedure Evaluation (Signed)
Anesthesia Post Note  Patient: Catherine Tate  Procedure(s) Performed: Procedure(s) (LRB): INSERTION PORT-A-CATH (N/A)  Patient location during evaluation: PACU Anesthesia Type: General Level of consciousness: awake and alert Pain management: pain level controlled Vital Signs Assessment: post-procedure vital signs reviewed and stable Respiratory status: spontaneous breathing, nonlabored ventilation, respiratory function stable and patient connected to nasal cannula oxygen Cardiovascular status: blood pressure returned to baseline and stable Postop Assessment: no signs of nausea or vomiting Anesthetic complications: no     Last Vitals:  Vitals:   04/01/16 1026 04/01/16 1109  BP: 135/65 (!) 161/83  Pulse: 85 87  Resp: 16 16  Temp: 36.3 C     Last Pain:  Vitals:   04/01/16 1026  TempSrc: Temporal  PainSc:                  Martha Clan

## 2016-04-01 NOTE — Op Note (Signed)
Preoperative diagnosis: Left breast cancer, need for central venous access.  Postoperative diagnosis: Same.  Operative procedure: Right subclavian PowerPort placement with ultrasound and fluoroscopic guidance  Operating surgeon: Hervey Ard, M.D.  Anesthesia: Attended local, 10 mL 1% plain Xylocaine.  Estimated blood loss: Less than 5 mL.  Clinical note: This 75 year old moves recently diagnosed with a stage II carcinoma the left breast for which neoadjuvant chemotherapy is planned. Her treating oncologist requested central venous access.  Operative note: With the patient under adequate sedation and in Trendelenburg position the right chest and neck was prepped with ChloraPrep and draped. Ultrasound was used to confirm patency of the subclavian system. Local anesthetic was infiltrated in the subclavian vein was cannulated under ultrasound guidance. A guidewire was passed followed by dilator and the catheter. Using fluoroscopy the catheter tip was positioned at the junction of the SVC and right atrium. The catheter was then tunneled to a pocket on the right anterior chest. This was sutured in place with 3-0 Prolene suture. The catheter easily irrigated and aspirated with the patient in the supine position. A final flush with 10 mL of injectable saline was completed. The wound was closed in layers with running 3-0 Monocryl suture (reported allergy to Vicryl) to the adipose tissue and then a running 3-0 Monocryl suture in the subcuticular layer. Benzoin Steri-Strips followed by Telfa and Tegaderm dressings were applied.  Correct port chest x-ray in recovery room showed the catheter tip as described above without evidence of pneumothorax.  The patient tolerated the procedure well.

## 2016-04-01 NOTE — H&P (Signed)
For power port placement. Procedure reviewed. No change in clinical history or exam.

## 2016-04-01 NOTE — Discharge Instructions (Signed)

## 2016-04-01 NOTE — Anesthesia Preprocedure Evaluation (Signed)
Anesthesia Evaluation  Patient identified by MRN, date of birth, ID band Patient awake    Reviewed: Allergy & Precautions, H&P , NPO status , Patient's Chart, lab work & pertinent test results, reviewed documented beta blocker date and time   History of Anesthesia Complications (+) PONV and history of anesthetic complications  Airway Mallampati: III  TM Distance: >3 FB Neck ROM: full    Dental  (+) Edentulous Upper, Edentulous Lower   Pulmonary neg pulmonary ROS, former smoker,    Pulmonary exam normal breath sounds clear to auscultation       Cardiovascular Exercise Tolerance: Good hypertension, On Medications (-) angina(-) CAD, (-) Past MI, (-) Cardiac Stents and (-) CABG Normal cardiovascular exam(-) dysrhythmias (-) Valvular Problems/Murmurs Rhythm:regular Rate:Normal     Neuro/Psych neg Seizures  Neuromuscular disease negative psych ROS   GI/Hepatic Neg liver ROS, GERD  ,  Endo/Other  negative endocrine ROS  Renal/GU negative Renal ROS  negative genitourinary   Musculoskeletal   Abdominal   Peds  Hematology negative hematology ROS (+)   Anesthesia Other Findings Past Medical History: No date: Arthritis 03/13/2016: Cancer ([place])     Comment: INVASIVE MAMMARY CARCINOMA No date: Chickenpox No date: Complication of anesthesia No date: Frequent UTI No date: GERD (gastroesophageal reflux disease)     Comment: RARE No date: Hypertension No date: PONV (postoperative nausea and vomiting) No date: RSD (reflex sympathetic dystrophy) No date: Seasonal allergies No date: Skin cancer No date: Urine incontinence   Reproductive/Obstetrics negative OB ROS                             Anesthesia Physical Anesthesia Plan  ASA: II  Anesthesia Plan: General   Post-op Pain Management:    Induction:   Airway Management Planned:   Additional Equipment:   Intra-op Plan:   Post-operative  Plan:   Informed Consent: I have reviewed the patients History and Physical, chart, labs and discussed the procedure including the risks, benefits and alternatives for the proposed anesthesia with the patient or authorized representative who has indicated his/her understanding and acceptance.   Dental Advisory Given  Plan Discussed with: Anesthesiologist, CRNA and Surgeon  Anesthesia Plan Comments:         Anesthesia Quick Evaluation

## 2016-04-01 NOTE — OR Nursing (Signed)
11:00 Dr. Bary Castilla notified that patient is doing well and requests dc home.  Ok'd dc without his visit.

## 2016-04-02 ENCOUNTER — Encounter: Payer: Self-pay | Admitting: General Surgery

## 2016-04-02 NOTE — Progress Notes (Signed)
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  INTERVAL HISTORY: Patient returns to clinic today for further evaluation and to initiate neoadjuvant chemotherapy with Taxotere and Cytoxan. Recent MUGA revealed an EF of 41%. She currently feels well and is asymptomatic. She has no neurologic complaints. She denies any recent fevers or illnesses. She has a good appetite and denies weight loss. She has no chest pain or shortness of breath. She denies any nausea, vomiting, constipation, or diarrhea. She has no urinary complaints. Patient offers no specific complaints today.  REVIEW OF SYSTEMS:   Review of Systems  Constitutional: Negative.  Negative for fever, malaise/fatigue and weight loss.  Respiratory: Negative.  Negative for cough and shortness of breath.   Cardiovascular: Negative.  Negative for chest pain and leg swelling.  Gastrointestinal: Negative.  Negative for abdominal pain.  Musculoskeletal: Negative.   Neurological: Negative.  Negative for weakness.  Psychiatric/Behavioral: The patient is nervous/anxious.     As per HPI. Otherwise, a complete review of systems is negative.  PAST MEDICAL HISTORY: Past Medical History:  Diagnosis Date  . Arthritis   . Cancer ([place]) 03/13/2016   INVASIVE MAMMARY CARCINOMA  . Chickenpox   . Complication of anesthesia   . Frequent UTI   . GERD (gastroesophageal reflux disease)    RARE  . Hypertension   . PONV (postoperative nausea and vomiting)   . RSD (reflex sympathetic dystrophy)   . Seasonal allergies   . Skin cancer   . Urine  incontinence     PAST SURGICAL HISTORY: Past Surgical History:  Procedure Laterality Date  . ABDOMINAL HYSTERECTOMY  2005   total/ Dr Davis Gourd  . BLADDER TUMOR EXCISION  2006  . BREAST BIOPSY Left 03/13/2016   INVASIVE MAMMARY CARCINOMA  . PORTACATH PLACEMENT N/A 04/01/2016   Procedure: INSERTION PORT-A-CATH;  Surgeon: [name], MD;  Location: [facility] ORS;  Service: General;  Laterality: N/A;  . SKIN CANCER EXCISION Right 2015   foot/ Dr Nevada Crane  . TUBAL LIGATION    . TUMOR EXCISION Right    FOOT    FAMILY HISTORY: Family History  Problem Relation Age of Onset  . Emphysema Father   . Heart disease Mother   . Transient ischemic attack Mother   . Breast cancer Cousin     paternal    ADVANCED DIRECTIVES (Y/N):  N  HEALTH MAINTENANCE: Social History  Substance Use Topics  . Smoking status: Former Smoker    Packs/day: 1.00    Years: 20.00    Types: Cigarettes    Quit date: 04/14/1994  . Smokeless tobacco: Never Used  . Alcohol use No     Colonoscopy:  PAP:  Bone density:  Lipid panel:  Allergies  Allergen Reactions  . Bee Pollen Hives  . Mosquito (Diagnostic) Hives  . Other Other (See Comments)    Uncoded Allergy. Allergen: VICRYL  (90% glycolide and 10% L-lactide [polyglactin-- synthetic absorbable sterile surgical suture]), Other Reaction: Infection  . Gabapentin Swelling  . Norvasc [Amlodipine Besylate] Swelling    TO FEET    Current Outpatient Prescriptions  Medication Sig Dispense Refill  . acetaminophen (TYLENOL) 500  MG tablet Take 500 mg by mouth every 6 (six) hours as needed (for pain.).    Marland Kitchen cephALEXin (KEFLEX) 250 MG capsule Take 1 capsule by mouth every Monday, Wednesday, and Saturday for UTI prevention. 15 capsule 5  . hydrochlorothiazide (HYDRODIURIL) 25 MG tablet Take 1 tablet (25 mg total) by mouth daily. 30 tablet 1  . lidocaine-prilocaine (EMLA) cream Apply 1 application topically as needed. Apply cream to port 1-2 hours prior to  chemotherapy appointment. Cover with plastic wrap. 30 g 2  . ondansetron (ZOFRAN) 8 MG tablet Take 1 tablet (8 mg total) by mouth 2 (two) times daily as needed for refractory nausea / vomiting. Start on day 3 after chemo. 30 tablet 1  . prochlorperazine (COMPAZINE) 10 MG tablet Take 1 tablet (10 mg total) by mouth every 6 (six) hours as needed (Nausea or vomiting). 30 tablet 1   No current facility-administered medications for this visit.     OBJECTIVE: Vitals:   04/03/16 0935  BP: (!) 149/87  Pulse: (!) 108  Resp: 18  Temp: 97.1 F (36.2 C)     Body mass index is 26.28 kg/m.    ECOG FS:0 - Asymptomatic  General: Well-developed, well-nourished, no acute distress. Eyes: Pink conjunctiva, anicteric sclera. Breasts: Palpable left breast nodule. Lungs: Clear to auscultation bilaterally. Heart: Regular rate and rhythm. No rubs, murmurs, or gallops. Abdomen: Soft, nontender, nondistended. No organomegaly noted, normoactive bowel sounds. Musculoskeletal: No edema, cyanosis, or clubbing. Neuro: Alert, answering all questions appropriately. Cranial nerves grossly intact. Skin: No rashes or petechiae noted. Psych: Normal affect.   LAB RESULTS:  Lab Results  Component Value Date   NA 136 04/03/2016   K 3.7 04/03/2016   CL 101 04/03/2016   CO2 27 04/03/2016   GLUCOSE 172 (H) 04/03/2016   BUN 21 (H) 04/03/2016   CREATININE 0.97 04/03/2016   CALCIUM 9.4 04/03/2016   PROT 7.7 04/03/2016   ALBUMIN 4.3 04/03/2016   AST 25 04/03/2016   ALT 26 04/03/2016   ALKPHOS 56 04/03/2016   BILITOT 0.6 04/03/2016   GFRNONAA 56 (L) 04/03/2016   GFRAA >60 04/03/2016    Lab Results  Component Value Date   WBC 6.8 04/03/2016   NEUTROABS 4.7 04/03/2016   HGB 13.8 04/03/2016   HCT 39.6 04/03/2016   MCV 89.8 04/03/2016   PLT 241 04/03/2016     STUDIES: Nm Cardiac Muga Rest  Result Date: 03/26/2016 CLINICAL DATA:  Breast cancer. Evaluate cardiac function in relation to chemotherapy.  EXAM: NUCLEAR MEDICINE CARDIAC BLOOD POOL IMAGING (MUGA) TECHNIQUE: Cardiac multi-gated acquisition was performed at rest following intravenous injection of Tc-70mlabeled red blood cells. RADIOPHARMACEUTICALS:  24.5 mCi Tc-947mDP in-vitro labeled red blood cells IV COMPARISON:  None FINDINGS: No  focal wall motion abnormality of the left ventricle. Calculated left ventricular ejection fraction equals 41.4% IMPRESSION: Left ventricular ejection fraction equals41.4%. Electronically Signed   By: StSuzy Bouchard.D.   On: 03/26/2016 15:57   Dg Chest Port 1 View  Result Date: 04/01/2016 CLINICAL DATA:  Port-A-Cath insertion EXAM: PORTABLE CHEST 1 VIEW COMPARISON:  None. FINDINGS: Port-A-Cath tip is at the cavoatrial junction. No pneumothorax. There is mild left base atelectasis. Lungs elsewhere clear. Heart size and pulmonary vascularity are normal. No adenopathy. There is atherosclerotic calcification in the aorta. No adenopathy. No bone lesions. IMPRESSION: Port-A-Cath tip at cavoatrial junction. No pneumothorax. Left base atelectasis. No edema or consolidation. There is aortic atherosclerosis. Electronically Signed   By: WiLowella GripII M.D.  On: 04/01/2016 10:35   Dg C-arm 1-60 Min-no Report  Result Date: 04/01/2016 There is no Radiologist interpretation  for this exam.  US Breast Complete Uni Left Inc Axilla  Result Date: 03/19/2016 Ultrasound examination of the left breast was done to assess the second area of the breast. The dominant mass at the 1:30 o'clock position, 8 cm from the nipple in the upper outer quadrant of the right breast shows a multilobulated irregular mass measuring 1.99 x 2.12 x 2.4 cm. Dense acoustic shadowing. Area of previous biopsy, BIRAD-5. In the retroareolar area of the left breast at 3:00 position a slightly irregular hypoechoic nodule measuring 0.35 x 0.43 x 0.46 cm were identified. Faint acoustic enhancement was noted. The patient was amenable to FNA sampling  and this was completed making use of 1 mL of 1% plain Xylocaine. This yielded a small volume of clear fluid with near complete resolution of the area. Slides 4 were prepared for cytology. At the 3:00 position, 3 cm from the nipple a hypoechoic smoothly marginated mass measuring 0.37 x 0.7 x 0.71 cm with posterior acoustic enhancement was noted. This was aspirated making use of 1 mL of 1% plain Xylocaine with complete resolution. The fluid was discarded. BI-RADS-2. Ultrasound examination of the upper-outer quadrant of the right breast in the area of clinical tenderness was completed. The only finding was a small bilobed cyst at the 12:00 position, 4 cm from the nipple measuring less than 0.3 cm in maximal diameter. Posterior acoustic enhancement was noted. No internal vascular flow. BIRAD-2.   US Breast Ltd Uni Left Inc Axilla  Result Date: 03/13/2016 CLINICAL DATA:  Left breast mass and right breast calcifications seen on most recent screening mammography. EXAM: 2D DIGITAL DIAGNOSTIC BILATERAL MAMMOGRAM WITH CAD AND ADJUNCT TOMO ULTRASOUND LEFT BREAST COMPARISON:  Previous exam(s). ACR Breast Density Category b: There are scattered areas of fibroglandular density. FINDINGS: There is a spiculated highly suspicious mass in the left breast upper outer quadrant, posterior depth. Slightly more inferior in the left breast slightly upper outer quadrant, posterior depth there is a second approximately 1 cm isodense to breast parenchyma circumscribed nodule. The calcifications in the right breast upper outer quadrant, posterior depth, noted on the most recent screening mammogram have benign features on compression magnification views. Mammographic images were processed with CAD. On physical exam, there is a moderately firm fixed palpable mass in the left o'clock breast, posterior depth. No skin or nipple changes are noted. Targeted ultrasound is performed, showing hypoechoic irregular highly suspicious mass in the left  2 o'clock breast 8 cm from the nipple which measures 1.7 by 2.4 by 1.7 cm. Additionally in the left breast 3 o'clock 2 cm from the nipple there is a hypoechoic circumscribed horizontally oriented overall benign-appearing nodule measuring 0.7 x 0.8 x 0.5 cm. There is no evidence of lymphadenopathy in the left axilla sonographically. IMPRESSION: Left breast 2 o'clock 8 cm from the nipple highly suspicious 2.4 cm mass, for which ultrasound-guided core needle biopsy is recommended. Left breast 3 o'clock 8 mm nodule with probably benign features. Ultrasound-guided core needle biopsy for this finding may be planned if breast conservation therapy is considered. No evidence of left axillary lymphadenopathy. No evidence of right breast malignancy. RECOMMENDATION: Ultrasound-guided core needle biopsy of the left breast. I have discussed the findings and recommendations with the patient. Results were also provided in writing at the conclusion of the visit. If applicable, a reminder letter will be sent to the patient regarding the next  appointment. BI-RADS CATEGORY  5: Highly suggestive of malignancy. Electronically Signed   By: Fidela Salisbury M.D.   On: 03/13/2016 10:31   Mm Diag Breast Tomo Bilateral  Result Date: 03/13/2016 CLINICAL DATA:  Left breast mass and right breast calcifications seen on most recent screening mammography. EXAM: 2D DIGITAL DIAGNOSTIC BILATERAL MAMMOGRAM WITH CAD AND ADJUNCT TOMO ULTRASOUND LEFT BREAST COMPARISON:  Previous exam(s). ACR Breast Density Category b: There are scattered areas of fibroglandular density. FINDINGS: There is a spiculated highly suspicious mass in the left breast upper outer quadrant, posterior depth. Slightly more inferior in the left breast slightly upper outer quadrant, posterior depth there is a second approximately 1 cm isodense to breast parenchyma circumscribed nodule. The calcifications in the right breast upper outer quadrant, posterior depth, noted on the  most recent screening mammogram have benign features on compression magnification views. Mammographic images were processed with CAD. On physical exam, there is a moderately firm fixed palpable mass in the left o'clock breast, posterior depth. No skin or nipple changes are noted. Targeted ultrasound is performed, showing hypoechoic irregular highly suspicious mass in the left 2 o'clock breast 8 cm from the nipple which measures 1.7 by 2.4 by 1.7 cm. Additionally in the left breast 3 o'clock 2 cm from the nipple there is a hypoechoic circumscribed horizontally oriented overall benign-appearing nodule measuring 0.7 x 0.8 x 0.5 cm. There is no evidence of lymphadenopathy in the left axilla sonographically. IMPRESSION: Left breast 2 o'clock 8 cm from the nipple highly suspicious 2.4 cm mass, for which ultrasound-guided core needle biopsy is recommended. Left breast 3 o'clock 8 mm nodule with probably benign features. Ultrasound-guided core needle biopsy for this finding may be planned if breast conservation therapy is considered. No evidence of left axillary lymphadenopathy. No evidence of right breast malignancy. RECOMMENDATION: Ultrasound-guided core needle biopsy of the left breast. I have discussed the findings and recommendations with the patient. Results were also provided in writing at the conclusion of the visit. If applicable, a reminder letter will be sent to the patient regarding the next appointment. BI-RADS CATEGORY  5: Highly suggestive of malignancy. Electronically Signed   By: Fidela Salisbury M.D.   On: 03/13/2016 10:31   Mm Clip Placement Left  Result Date: 03/13/2016 CLINICAL DATA:  Post ultrasound-guided core needle biopsy of the left breast. EXAM: DIAGNOSTIC LEFT MAMMOGRAM POST ULTRASOUND BIOPSY COMPARISON:  Previous exam(s). FINDINGS: Mammographic images were obtained following ultrasound guided biopsy of left breast 2 o'clock mass. Two-view mammography demonstrates presence of spiral  shaped HydroMARK 3 within left breast upper outer quadrant mass. Expected post biopsy changes are seen. IMPRESSION: Successful placement of post biopsy marker within the left breast, post ultrasound-guided biopsy. Final Assessment: Post Procedure Mammograms for Marker Placement Electronically Signed   By: Fidela Salisbury M.D.   On: 03/13/2016 12:56   [place] Lt Breast Bx W Loc Dev 1st Lesion Img Bx Spec US Guide  Addendum Date: 03/19/2016   ADDENDUM REPORT: 03/18/2016 15:20 ADDENDUM: Pathology of the left breast biopsy revealed INVASIVE MAMMARY CARCINOMA WITH MUCINOUS FEATURES. PRELIMINARY GRADE 2. Recommendations: Surgical and oncology referrals. Also recommend an ultrasound guided core biopsy of an additional lesion 3:00 o'clock location if breast conservation treatment is desired. Results and recommendations were discussed with Alma Friendly, NP by Jetta Lout, RRA on 03/17/16. She stated she will contact the patient with results and would like for the nurse navigators at [facility] to make the referral. The patient was contacted by [facility]  Margaree Mackintosh, [place] on 03/17/16 for a post biopsy check. She stated she has done well following the biopsy with no bleeding, bruising, or hematoma. Post biopsy instructions were reviewed with the patient. All of her questions were answered. She was encouraged to call the [facility] with any further questions or concerns. The patient stated Alma Friendly, NP had contacted her with the results. She requests a referral be made to see Dr. Bary Castilla. A surgical referral was made with Dr. Bary Castilla on 03/19/16 at 11:00 AM and an oncology referral with Dr. Grayland Ormond on 03/20/16 at 3:30 PM by Tanya Nones, RN, nurse navigator for [facility]. The patient has been notified of the appointments. Addendum by Jetta Lout, RRA on 03/18/16. Electronically Signed   By: Fidela Salisbury M.D.   On: 03/18/2016  15:20   Result Date: 03/19/2016 CLINICAL DATA:  Left breast 2 o'clock mass. EXAM: ULTRASOUND GUIDED LEFT BREAST CORE NEEDLE BIOPSY COMPARISON:  Previous exam(s). FINDINGS: I met with the patient and we discussed the procedure of ultrasound-guided biopsy, including benefits and alternatives. We discussed the high likelihood of a successful procedure. We discussed the risks of the procedure, including infection, bleeding, tissue injury, clip migration, and inadequate sampling. Informed written consent was given. The usual time-out protocol was performed immediately prior to the procedure. Using sterile technique and 1% Lidocaine as local anesthetic, under direct ultrasound visualization, a 14 gauge spring-loaded device was used to perform biopsy of left breast 2 o'clock mass using a inferior approach. At the conclusion of the procedure a HydroMARK 3 spiral tissue marker clip was deployed into the biopsy cavity. Follow up 2 view mammogram was performed and dictated separately. IMPRESSION: Ultrasound guided biopsy of left breast.  No apparent complications. Electronically Signed: By: Fidela Salisbury M.D. 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  PLAN:    1. 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 FISH was negative for HER-2 therefore she does not require Herceptin. Also, given her EF of 41% cannot use Adriamycin. Proceed with cycle 1 of 4 of neoadjuvant Taxotere and Cytoxan. Will refer her back to surgery at the conclusion of her chemotherapy. Given the ER/PR status of patient's tumor, she will definitely benefit from an aromatase inhibitor at the conclusion of all her treatments. She will also  require adjuvant XRT if she undergoes lumpectomy. Return to clinic in 1 week for laboratory work only and then in 3 weeks for consideration of cycle 2.   Approximately 30 minutes was spent in discussion of which greater than 50% was consultation.  Patient expressed understanding and was in agreement with this plan. She also understands that She can call clinic at any time with any questions, concerns, or complaints.   Malignant neoplasm of upper-outer quadrant of left female breast [facility])   Staging form: Breast, AJCC 7th Edition   - Clinical stage from 03/19/2016: Stage IIA (T2, N0, M0) - Signed by Lloyd Huger, MD on 04/02/2016  Lloyd Huger, MD   04/06/2016 8:36 AM

## 2016-04-03 ENCOUNTER — Inpatient Hospital Stay: Payer: PPO

## 2016-04-03 ENCOUNTER — Encounter: Payer: Self-pay | Admitting: *Deleted

## 2016-04-03 ENCOUNTER — Inpatient Hospital Stay (HOSPITAL_BASED_OUTPATIENT_CLINIC_OR_DEPARTMENT_OTHER): Payer: PPO | Admitting: Oncology

## 2016-04-03 VITALS — BP 146/86 | HR 98 | Temp 97.1°F | Resp 18

## 2016-04-03 VITALS — BP 149/87 | HR 108 | Temp 97.1°F | Resp 18 | Wt 162.8 lb

## 2016-04-03 DIAGNOSIS — Z5111 Encounter for antineoplastic chemotherapy: Secondary | ICD-10-CM | POA: Diagnosis not present

## 2016-04-03 DIAGNOSIS — Z79899 Other long term (current) drug therapy: Secondary | ICD-10-CM

## 2016-04-03 DIAGNOSIS — M129 Arthropathy, unspecified: Secondary | ICD-10-CM

## 2016-04-03 DIAGNOSIS — C50412 Malignant neoplasm of upper-outer quadrant of left female breast: Secondary | ICD-10-CM

## 2016-04-03 DIAGNOSIS — F419 Anxiety disorder, unspecified: Secondary | ICD-10-CM | POA: Diagnosis not present

## 2016-04-03 DIAGNOSIS — Z170 Estrogen receptor positive status [ER+]: Principal | ICD-10-CM

## 2016-04-03 DIAGNOSIS — Z803 Family history of malignant neoplasm of breast: Secondary | ICD-10-CM

## 2016-04-03 DIAGNOSIS — Z85828 Personal history of other malignant neoplasm of skin: Secondary | ICD-10-CM

## 2016-04-03 DIAGNOSIS — Z87891 Personal history of nicotine dependence: Secondary | ICD-10-CM

## 2016-04-03 DIAGNOSIS — R32 Unspecified urinary incontinence: Secondary | ICD-10-CM

## 2016-04-03 LAB — CBC WITH DIFFERENTIAL/PLATELET
BASOS ABS: 0 10*3/uL (ref 0–0.1)
Basophils Relative: 1 %
EOS ABS: 0.1 10*3/uL (ref 0–0.7)
EOS PCT: 1 %
HCT: 39.6 % (ref 35.0–47.0)
Hemoglobin: 13.8 g/dL (ref 12.0–16.0)
LYMPHS ABS: 1.6 10*3/uL (ref 1.0–3.6)
Lymphocytes Relative: 24 %
MCH: 31.3 pg (ref 26.0–34.0)
MCHC: 34.9 g/dL (ref 32.0–36.0)
MCV: 89.8 fL (ref 80.0–100.0)
Monocytes Absolute: 0.3 10*3/uL (ref 0.2–0.9)
Monocytes Relative: 5 %
NEUTROS PCT: 69 %
Neutro Abs: 4.7 10*3/uL (ref 1.4–6.5)
PLATELETS: 241 10*3/uL (ref 150–440)
RBC: 4.4 MIL/uL (ref 3.80–5.20)
RDW: 12.9 % (ref 11.5–14.5)
WBC: 6.8 10*3/uL (ref 3.6–11.0)

## 2016-04-03 LAB — COMPREHENSIVE METABOLIC PANEL
ALT: 26 U/L (ref 14–54)
AST: 25 U/L (ref 15–41)
Albumin: 4.3 g/dL (ref 3.5–5.0)
Alkaline Phosphatase: 56 U/L (ref 38–126)
Anion gap: 8 (ref 5–15)
BUN: 21 mg/dL — ABNORMAL HIGH (ref 6–20)
CHLORIDE: 101 mmol/L (ref 101–111)
CO2: 27 mmol/L (ref 22–32)
CREATININE: 0.97 mg/dL (ref 0.44–1.00)
Calcium: 9.4 mg/dL (ref 8.9–10.3)
GFR calc non Af Amer: 56 mL/min — ABNORMAL LOW (ref >60)
GLUCOSE: 172 mg/dL — AB (ref 65–99)
Potassium: 3.7 mmol/L (ref 3.5–5.1)
SODIUM: 136 mmol/L (ref 135–145)
Total Bilirubin: 0.6 mg/dL (ref 0.3–1.2)
Total Protein: 7.7 g/dL (ref 6.5–8.1)

## 2016-04-03 MED ORDER — SODIUM CHLORIDE 0.9 % IV SOLN
Freq: Once | INTRAVENOUS | Status: AC
  Administered 2016-04-03: 11:00:00 via INTRAVENOUS
  Filled 2016-04-03: qty 1000

## 2016-04-03 MED ORDER — PALONOSETRON HCL INJECTION 0.25 MG/5ML
0.2500 mg | Freq: Once | INTRAVENOUS | Status: AC
  Administered 2016-04-03: 0.25 mg via INTRAVENOUS
  Filled 2016-04-03: qty 5

## 2016-04-03 MED ORDER — SODIUM CHLORIDE 0.9 % IJ SOLN
10.0000 mL | Freq: Once | INTRAMUSCULAR | Status: AC
  Administered 2016-04-03: 10 mL via INTRAVENOUS
  Filled 2016-04-03: qty 10

## 2016-04-03 MED ORDER — SODIUM CHLORIDE 0.9 % IV SOLN
75.0000 mg/m2 | Freq: Once | INTRAVENOUS | Status: AC
  Administered 2016-04-03: 140 mg via INTRAVENOUS
  Filled 2016-04-03: qty 14

## 2016-04-03 MED ORDER — SODIUM CHLORIDE 0.9 % IV SOLN: 10.0000 mg | Freq: Once | INTRAVENOUS | Status: DC

## 2016-04-03 MED ORDER — HEPARIN SOD (PORK) LOCK FLUSH 100 UNIT/ML IV SOLN
500.0000 [IU] | Freq: Once | INTRAVENOUS | Status: AC
  Administered 2016-04-03: 500 [IU] via INTRAVENOUS
  Filled 2016-04-03: qty 5

## 2016-04-03 MED ORDER — DEXAMETHASONE SODIUM PHOSPHATE 10 MG/ML IJ SOLN
10.0000 mg | Freq: Once | INTRAMUSCULAR | Status: AC
  Administered 2016-04-03: 10 mg via INTRAVENOUS
  Filled 2016-04-03: qty 1

## 2016-04-03 MED ORDER — SODIUM CHLORIDE 0.9 % IV SOLN
1100.0000 mg | Freq: Once | INTRAVENOUS | Status: AC
  Administered 2016-04-03: 1100 mg via INTRAVENOUS
  Filled 2016-04-03: qty 50

## 2016-04-03 NOTE — Progress Notes (Signed)
States did not sleep well last night due to chronic incontinence. PAC has been throbbing and tylenol has been helping relieve pain.

## 2016-04-03 NOTE — Progress Notes (Signed)
  Oncology Nurse Navigator Documentation  Navigator Location: [facility] (04/03/16 1300)   )Navigator Encounter Type: Treatment (04/03/16 1300)    Met patient today prior to her first chemotherapy treatment.  Offered support.  She is to call if she has any questions or needs.    Treatment Phase: First Chemo Tx (04/03/16 1300) Barriers/Navigation Needs: Education (04/03/16 1300) Education: Other (04/03/16 1300)       Education Method: Verbal (04/03/16 1300)     Acuity: Level 2 (04/03/16 1300)         Time Spent with Patient: 30 (04/03/16 1300)

## 2016-04-10 ENCOUNTER — Telehealth: Payer: Self-pay | Admitting: *Deleted

## 2016-04-10 ENCOUNTER — Inpatient Hospital Stay: Payer: PPO

## 2016-04-10 ENCOUNTER — Other Ambulatory Visit: Payer: Self-pay | Admitting: Oncology

## 2016-04-10 DIAGNOSIS — Z5111 Encounter for antineoplastic chemotherapy: Secondary | ICD-10-CM | POA: Diagnosis not present

## 2016-04-10 DIAGNOSIS — C50412 Malignant neoplasm of upper-outer quadrant of left female breast: Secondary | ICD-10-CM

## 2016-04-10 DIAGNOSIS — Z170 Estrogen receptor positive status [ER+]: Principal | ICD-10-CM

## 2016-04-10 LAB — CBC WITH DIFFERENTIAL/PLATELET
BASOS ABS: 0 10*3/uL (ref 0–0.1)
Basophils Relative: 2 %
Eosinophils Absolute: 0 10*3/uL (ref 0–0.7)
Eosinophils Relative: 2 %
HEMATOCRIT: 34.9 % — AB (ref 35.0–47.0)
Hemoglobin: 12.2 g/dL (ref 12.0–16.0)
LYMPHS ABS: 0.8 10*3/uL — AB (ref 1.0–3.6)
LYMPHS PCT: 84 %
MCH: 31.1 pg (ref 26.0–34.0)
MCHC: 34.9 g/dL (ref 32.0–36.0)
MCV: 89.1 fL (ref 80.0–100.0)
Monocytes Absolute: 0.1 10*3/uL — ABNORMAL LOW (ref 0.2–0.9)
Monocytes Relative: 6 %
NEUTROS ABS: 0.1 10*3/uL — AB (ref 1.4–6.5)
Neutrophils Relative %: 6 %
Platelets: 194 10*3/uL (ref 150–440)
RBC: 3.91 MIL/uL (ref 3.80–5.20)
RDW: 12.3 % (ref 11.5–14.5)
WBC: 0.9 10*3/uL — AB (ref 3.6–11.0)

## 2016-04-10 LAB — COMPREHENSIVE METABOLIC PANEL
ALK PHOS: 73 U/L (ref 38–126)
ALT: 62 U/L — AB (ref 14–54)
AST: 35 U/L (ref 15–41)
Albumin: 4 g/dL (ref 3.5–5.0)
Anion gap: 9 (ref 5–15)
BILIRUBIN TOTAL: 0.7 mg/dL (ref 0.3–1.2)
BUN: 30 mg/dL — AB (ref 6–20)
CALCIUM: 9.1 mg/dL (ref 8.9–10.3)
CO2: 28 mmol/L (ref 22–32)
CREATININE: 1.19 mg/dL — AB (ref 0.44–1.00)
Chloride: 93 mmol/L — ABNORMAL LOW (ref 101–111)
GFR calc Af Amer: 51 mL/min — ABNORMAL LOW (ref >60)
GFR, EST NON AFRICAN AMERICAN: 44 mL/min — AB (ref >60)
Glucose, Bld: 180 mg/dL — ABNORMAL HIGH (ref 65–99)
Potassium: 3.6 mmol/L (ref 3.5–5.1)
Sodium: 130 mmol/L — ABNORMAL LOW (ref 135–145)
TOTAL PROTEIN: 7.7 g/dL (ref 6.5–8.1)

## 2016-04-10 NOTE — Telephone Encounter (Signed)
Critical results of ANC 0.1 and WBC 0.9.

## 2016-04-10 NOTE — Telephone Encounter (Signed)
Will ad onpro neulasta with rest of treatments.

## 2016-04-14 DIAGNOSIS — Z923 Personal history of irradiation: Secondary | ICD-10-CM

## 2016-04-14 HISTORY — DX: Personal history of irradiation: Z92.3

## 2016-04-14 HISTORY — PX: BREAST LUMPECTOMY: SHX2

## 2016-04-15 ENCOUNTER — Other Ambulatory Visit: Payer: Self-pay | Admitting: Primary Care

## 2016-04-15 DIAGNOSIS — I10 Essential (primary) hypertension: Secondary | ICD-10-CM

## 2016-04-17 ENCOUNTER — Telehealth: Payer: Self-pay

## 2016-04-17 NOTE — Telephone Encounter (Signed)
Pt called complaining of burning/urgency when urinating for a couple of days now, she thinks the chemo is causing this? She takes Keflex for preventative care on mondays Wednesday and fridays she would like to know if she should take it for the rest of the days? I asked to come leave a urine sample and she said that she doesn't want to drive in is weather.

## 2016-04-17 NOTE — Telephone Encounter (Signed)
Called patient and schedule appt on 04/18/2016

## 2016-04-17 NOTE — Telephone Encounter (Signed)
She needs an office visit for further evaluation.

## 2016-04-18 ENCOUNTER — Encounter: Payer: Self-pay | Admitting: Primary Care

## 2016-04-18 ENCOUNTER — Ambulatory Visit (INDEPENDENT_AMBULATORY_CARE_PROVIDER_SITE_OTHER): Payer: PPO | Admitting: Primary Care

## 2016-04-18 VITALS — BP 146/86 | HR 115 | Temp 97.7°F | Ht 66.0 in | Wt 165.1 lb

## 2016-04-18 DIAGNOSIS — I10 Essential (primary) hypertension: Secondary | ICD-10-CM

## 2016-04-18 DIAGNOSIS — N390 Urinary tract infection, site not specified: Secondary | ICD-10-CM | POA: Diagnosis not present

## 2016-04-18 DIAGNOSIS — R319 Hematuria, unspecified: Secondary | ICD-10-CM

## 2016-04-18 LAB — POC URINALSYSI DIPSTICK (AUTOMATED)
Bilirubin, UA: NEGATIVE
Glucose, UA: NEGATIVE
Ketones, UA: NEGATIVE
Nitrite, UA: NEGATIVE
PROTEIN UA: NEGATIVE
Spec Grav, UA: 1.015
UROBILINOGEN UA: NEGATIVE
pH, UA: 7

## 2016-04-18 MED ORDER — NITROFURANTOIN MONOHYD MACRO 100 MG PO CAPS: 100.0000 mg | ORAL_CAPSULE | Freq: Two times a day (BID) | ORAL | 0 refills | Status: DC

## 2016-04-18 MED ORDER — HYDROCHLOROTHIAZIDE 25 MG PO TABS: 25.0000 mg | ORAL_TABLET | Freq: Every day | ORAL | 1 refills | Status: DC

## 2016-04-18 NOTE — Progress Notes (Signed)
Subjective:    Patient ID: Catherine Tate, female    DOB: February 07, 1941, 76 y.o.   MRN: [id number]  HPI  Catherine Tate is a 76 year old female with newly diagnosed breast cancer (managed on chemotherapy) and recurrent UTI's (managed on cephalexin three days weekly), who presents today with a chief complaint of urinary frequency. She also reports dysuria. Her symptoms began 2-3 days. She's been taking her cephalexin as prescribed. She started chemotherapy on 04/03/16. She denies fevers, flank pain, vaginal symptoms, hematuria.   Review of Systems  Constitutional: Negative for fever.  Gastrointestinal: Negative for abdominal pain.  Genitourinary: Positive for dysuria and frequency. Negative for decreased urine volume, flank pain, hematuria and vaginal discharge.       Past Medical History:  Diagnosis Date  . Arthritis   . Cancer ([place]) 03/13/2016   INVASIVE MAMMARY CARCINOMA  . Chickenpox   . Complication of anesthesia   . Frequent UTI   . GERD (gastroesophageal reflux disease)    RARE  . Hypertension   . PONV (postoperative nausea and vomiting)   . RSD (reflex sympathetic dystrophy)   . Seasonal allergies   . Skin cancer   . Urine incontinence      Social History   Social History  . Marital status: Single    Spouse name: N/A  . Number of children: N/A  . Years of education: N/A   Occupational History  . Not on file.   Social History Main Topics  . Smoking status: Former Smoker    Packs/day: 1.00    Years: 20.00    Types: Cigarettes    Quit date: 04/14/1994  . Smokeless tobacco: Never Used  . Alcohol use No  . Drug use: No  . Sexual activity: Not on file   Other Topics Concern  . Not on file   Social History Narrative   Single.   2 children, 1 grandchild.   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   Enjoys making jewelry, puzzle books, sewing, traveling.       Past Surgical History:  Procedure Laterality Date  . ABDOMINAL HYSTERECTOMY  2005   total/ Dr  Davis Gourd  . BLADDER TUMOR EXCISION  2006  . BREAST BIOPSY Left 03/13/2016   INVASIVE MAMMARY CARCINOMA  . PORTACATH PLACEMENT N/A 04/01/2016   Procedure: INSERTION PORT-A-CATH;  Surgeon: [name], MD;  Location: [facility] ORS;  Service: General;  Laterality: N/A;  . SKIN CANCER EXCISION Right 2015   foot/ Dr Nevada Crane  . TUBAL LIGATION    . TUMOR EXCISION Right    FOOT    Family History  Problem Relation Age of Onset  . Emphysema Father   . Heart disease Mother   . Transient ischemic attack Mother   . Breast cancer Cousin     paternal    Allergies  Allergen Reactions  . Bee Pollen Hives  . Mosquito (Diagnostic) Hives  . Other Other (See Comments)    Uncoded Allergy. Allergen: VICRYL  (90% glycolide and 10% L-lactide [polyglactin-- synthetic absorbable sterile surgical suture]), Other Reaction: Infection  . Gabapentin Swelling  . Norvasc [Amlodipine Besylate] Swelling    TO FEET    Current Outpatient Prescriptions on File Prior to Visit  Medication Sig Dispense Refill  . acetaminophen (TYLENOL) 500 MG tablet Take 500 mg by mouth every 6 (six) hours as needed (for pain.).    Marland Kitchen cephALEXin (KEFLEX) 250 MG capsule Take 1 capsule by mouth every Monday, Wednesday, and Saturday for  UTI prevention. 15 capsule 5  . hydrochlorothiazide (HYDRODIURIL) 25 MG tablet TAKE 1 TABLET BY MOUTH ONCE A DAY 30 tablet 1  . lidocaine-prilocaine (EMLA) cream Apply 1 application topically as needed. Apply cream to port 1-2 hours prior to chemotherapy appointment. Cover with plastic wrap. 30 g 2  . ondansetron (ZOFRAN) 8 MG tablet Take 1 tablet (8 mg total) by mouth 2 (two) times daily as needed for refractory nausea / vomiting. Start on day 3 after chemo. 30 tablet 1  . prochlorperazine (COMPAZINE) 10 MG tablet Take 1 tablet (10 mg total) by mouth every 6 (six) hours as needed (Nausea or vomiting). 30 tablet 1   No current facility-administered medications on file prior to visit.     BP (!) 146/86    Pulse (!) 115   Temp 97.7 F (36.5 C) (Oral)   Ht 5\' 6"  (1.676 m)   Wt 165 lb 1.9 oz (74.9 kg)   SpO2 95%   BMI 26.65 kg/m    Objective:   Physical Exam  Constitutional: She appears well-nourished.  Neck: Neck supple.  Cardiovascular: Normal rate and regular rhythm.   Pulmonary/Chest: Effort normal and breath sounds normal.  Abdominal: Soft. Bowel sounds are normal. There is no tenderness. There is no CVA tenderness.  Skin: Skin is warm and dry.          Assessment & Plan:

## 2016-04-18 NOTE — Progress Notes (Signed)
Pre visit review using our clinic review tool, if applicable. No additional management support is needed unless otherwise documented below in the visit note. 

## 2016-04-18 NOTE — Patient Instructions (Addendum)
Start Macrobid (nitrofurantoin) tablets for urinary tract infection. Take 1 tablet by mouth twice daily for seven days.  Stop taking your Cephalexin for now until I contact you.  I sent refills for your hydrochlorothiazide (blood pressure).  I highly recommend you see a Urologist for these recurrent infections.  Ensure you are staying hydrated and rest.  It was a pleasure to see you today!

## 2016-04-18 NOTE — Assessment & Plan Note (Signed)
UA today consistent with UTI. UA: 3+ leuks, trace blood. Negative nitrites. Culture sent. Will have her stop cephalexin as I'm afraid she may have developed resistance. Start Macrobid seven day course. Last urine culture with resistance to Cipro, Septra. Long discussion today regarding recommendation for Urology evaluation, especially given chemotherapy. She declines again but will think about it. Fluids, rest, follow up PRN.

## 2016-04-21 ENCOUNTER — Other Ambulatory Visit: Payer: Self-pay | Admitting: Primary Care

## 2016-04-21 DIAGNOSIS — R319 Hematuria, unspecified: Principal | ICD-10-CM

## 2016-04-21 DIAGNOSIS — N390 Urinary tract infection, site not specified: Secondary | ICD-10-CM

## 2016-04-21 LAB — URINE CULTURE

## 2016-04-21 MED ORDER — SULFAMETHOXAZOLE-TRIMETHOPRIM 800-160 MG PO TABS: ORAL_TABLET | ORAL | 0 refills | Status: DC

## 2016-04-22 ENCOUNTER — Telehealth: Payer: Self-pay | Admitting: Primary Care

## 2016-04-22 DIAGNOSIS — N390 Urinary tract infection, site not specified: Secondary | ICD-10-CM

## 2016-04-22 NOTE — Telephone Encounter (Signed)
Noted  Referral placed.

## 2016-04-22 NOTE — Telephone Encounter (Signed)
Pt is requesting a referral to Dr Ernestine Conrad for recurrent uti's . Pt states she has talked with you regarding this problem. The office number is [phone]  cb number for pt is [phone] Thanks

## 2016-04-23 ENCOUNTER — Other Ambulatory Visit: Payer: Self-pay | Admitting: Oncology

## 2016-04-23 NOTE — Progress Notes (Signed)
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  INTERVAL HISTORY: Patient returns to clinic today for further evaluation and consideration of cycle 2 of 4 of neoadjuvant Taxotere and Cytoxan.  She tolerated her first treatment well with minimal side effects. She has mild constipation which is well controlled with OTC treatments. She also has some incontinence and is seeing urology later this month. She otherwise feels well and is asymptomatic. She has no neurologic complaints. She denies any recent fevers or illnesses. She has a good appetite and denies weight loss. She has no chest pain or shortness of breath. She denies any nausea, vomiting, or diarrhea. Patient offers no specific complaints today.  REVIEW OF SYSTEMS:   Review of Systems  Constitutional: Negative.  Negative for fever, malaise/fatigue and weight loss.  Respiratory: Negative.  Negative for cough and shortness of breath.   Cardiovascular: Negative.  Negative for chest pain and leg swelling.  Gastrointestinal: Positive for constipation. Negative for abdominal pain.  Genitourinary: Positive for urgency.  Musculoskeletal: Negative.   Neurological: Negative.  Negative for weakness.  Psychiatric/Behavioral: The patient is nervous/anxious.     As per HPI. Otherwise, a complete review of systems is negative.  PAST MEDICAL HISTORY: Past Medical History:  Diagnosis Date  . Arthritis   . Cancer ([place]) 03/13/2016   INVASIVE MAMMARY CARCINOMA  . Chickenpox   . Complication of anesthesia   . Frequent UTI   . GERD  (gastroesophageal reflux disease)    RARE  . Hypertension   . PONV (postoperative nausea and vomiting)   . RSD (reflex sympathetic dystrophy)   . Seasonal allergies   . Skin cancer   . Urine incontinence     PAST SURGICAL HISTORY: Past Surgical History:  Procedure Laterality Date  . ABDOMINAL HYSTERECTOMY  2005   total/ Dr Davis Gourd  . BLADDER TUMOR EXCISION  2006  . BREAST BIOPSY Left 03/13/2016   INVASIVE MAMMARY CARCINOMA  . PORTACATH PLACEMENT N/A 04/01/2016   Procedure: INSERTION PORT-A-CATH;  Surgeon: [name], MD;  Location: [facility] ORS;  Service: General;  Laterality: N/A;  . SKIN CANCER EXCISION Right 2015   foot/ Dr [place] Crane  . TUBAL LIGATION    . TUMOR EXCISION Right    FOOT    FAMILY HISTORY: Family History  Problem Relation Age of Onset  . Emphysema Father   . Heart disease Mother   . Transient ischemic attack Mother   . Breast cancer Cousin     paternal    ADVANCED DIRECTIVES (Y/N):  N  HEALTH MAINTENANCE: Social History  Substance Use Topics  . Smoking status: Former Smoker    Packs/day: 1.00    Years: 20.00    Types: Cigarettes    Quit date: 04/14/1994  . Smokeless tobacco: Never Used  . Alcohol use No     Colonoscopy:  PAP:  Bone density:  Lipid panel:  Allergies  Allergen Reactions  . Bee Pollen Hives  . Mosquito (Diagnostic) Hives  . Other Other (See Comments)    Uncoded Allergy. Allergen: VICRYL  (90% glycolide and 10% L-lactide [polyglactin-- synthetic absorbable sterile surgical suture]), Other Reaction: Infection  .  Gabapentin Swelling  . Norvasc [Amlodipine Besylate] Swelling    TO FEET    Current Outpatient Prescriptions  Medication Sig Dispense Refill  . acetaminophen (TYLENOL) 500 MG tablet Take 500 mg by mouth every 6 (six) hours as needed (for pain.).    Marland Kitchen hydrochlorothiazide (HYDRODIURIL) 25 MG tablet Take 1 tablet (25 mg total) by mouth daily. 90 tablet 1  . lidocaine-prilocaine (EMLA) cream Apply 1 application  topically as needed. Apply cream to port 1-2 hours prior to chemotherapy appointment. Cover with plastic wrap. 30 g 2  . ondansetron (ZOFRAN) 8 MG tablet Take 1 tablet (8 mg total) by mouth 2 (two) times daily as needed for refractory nausea / vomiting. Start on day 3 after chemo. 30 tablet 1  . prochlorperazine (COMPAZINE) 10 MG tablet Take 1 tablet (10 mg total) by mouth every 6 (six) hours as needed (Nausea or vomiting). 30 tablet 1  . sulfamethoxazole-trimethoprim (BACTRIM DS,SEPTRA DS) 800-160 MG tablet Take 1 tablet by mouth twice daily for urinary tract infection. 10 tablet 0   No current facility-administered medications for this visit.     OBJECTIVE: Vitals:   04/24/16 0929  BP: (!) 146/90  Pulse: (!) 109  Resp: 18  Temp: 97.1 F (36.2 C)     Body mass index is 26.55 kg/m.    ECOG FS:0 - Asymptomatic  General: Well-developed, well-nourished, no acute distress. Eyes: Pink conjunctiva, anicteric sclera. Breasts: Palpable left breast nodule. Exam deferred today. Lungs: Clear to auscultation bilaterally. Heart: Regular rate and rhythm. No rubs, murmurs, or gallops. Abdomen: Soft, nontender, nondistended. No organomegaly noted, normoactive bowel sounds. Musculoskeletal: No edema, cyanosis, or clubbing. Neuro: Alert, answering all questions appropriately. Cranial nerves grossly intact. Skin: No rashes or petechiae noted. Psych: Normal affect.   LAB RESULTS:  Lab Results  Component Value Date   NA 127 (L) 04/24/2016   K 3.6 04/24/2016   CL 93 (L) 04/24/2016   CO2 26 04/24/2016   GLUCOSE 141 (H) 04/24/2016   BUN 19 04/24/2016   CREATININE 1.19 (H) 04/24/2016   CALCIUM 9.3 04/24/2016   PROT 7.4 04/24/2016   ALBUMIN 3.9 04/24/2016   AST 31 04/24/2016   ALT 36 04/24/2016   ALKPHOS 64 04/24/2016   BILITOT 0.4 04/24/2016   GFRNONAA 44 (L) 04/24/2016   GFRAA 51 (L) 04/24/2016    Lab Results  Component Value Date   WBC 10.7 04/24/2016   NEUTROABS 8.0 (H) 04/24/2016     HGB 12.2 04/24/2016   HCT 34.6 (L) 04/24/2016   MCV 88.2 04/24/2016   PLT 322 04/24/2016     STUDIES: Dg Chest Port 1 View  Result Date: 04/01/2016 CLINICAL DATA:  Port-A-Cath insertion EXAM: PORTABLE CHEST 1 VIEW COMPARISON:  None. FINDINGS: Port-A-Cath tip is at the cavoatrial junction. No pneumothorax. There is mild left base atelectasis. Lungs elsewhere clear. Heart size and pulmonary vascularity are normal. No adenopathy. There is atherosclerotic calcification in the aorta. No adenopathy. No bone lesions. IMPRESSION: Port-A-Cath tip at cavoatrial junction. No pneumothorax. Left base atelectasis. No edema or consolidation. There is aortic atherosclerosis. Electronically Signed   By: Lowella Grip III M.D.   On: 04/01/2016 10:35   Dg C-arm 1-60 Min-no Report  Result Date: 04/01/2016 There is no Radiologist interpretation  for this exam.   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  PLAN:    1. Clinical stage IIa ER/PR positive, HER-2 negative invasive carcinoma of  the upper outer quadrant of the left breast: Given the stage of in size of patient's breast cancer, have recommended neoadjuvant chemotherapy prior to pursuing lumpectomy. FISH was negative for HER-2 therefore she does not require Herceptin. Also, given her EF of 41% cannot use Adriamycin. Proceed with cycle 2 of 4 of neoadjuvant Taxotere and Cytoxan. Will refer her back to surgery at the conclusion of her chemotherapy. Given the ER/PR status of patient's tumor, she will benefit from an aromatase inhibitor at the conclusion of all her treatments. She will also require adjuvant XRT if she undergoes lumpectomy. Return to clinic in 3 weeks for consideration of cycle 2.  2. Hyponatremia: Possibly secondary to mild dehydration, monitor. 3. Constipation: Continue OTC treatments as needed. 4. Incontinence: Patient has appointment with urology  on May 12, 2016.   Patient expressed understanding and was in agreement with this plan. She also understands that She can call clinic at any time with any questions, concerns, or complaints.   Malignant neoplasm of upper-outer quadrant of left female breast [facility])   Staging form: Breast, AJCC 7th Edition   - Clinical stage from 03/19/2016: Stage IIA (T2, N0, M0) - Signed by Lloyd Huger, MD on 04/06/2016  Lloyd Huger, MD   04/26/2016 8:34 AM

## 2016-04-24 ENCOUNTER — Inpatient Hospital Stay: Payer: PPO

## 2016-04-24 ENCOUNTER — Inpatient Hospital Stay (HOSPITAL_BASED_OUTPATIENT_CLINIC_OR_DEPARTMENT_OTHER): Payer: PPO | Admitting: Oncology

## 2016-04-24 ENCOUNTER — Inpatient Hospital Stay: Payer: PPO | Attending: Oncology

## 2016-04-24 VITALS — BP 146/90 | HR 109 | Temp 97.1°F | Resp 18 | Wt 164.5 lb

## 2016-04-24 DIAGNOSIS — Z170 Estrogen receptor positive status [ER+]: Secondary | ICD-10-CM | POA: Diagnosis not present

## 2016-04-24 DIAGNOSIS — R32 Unspecified urinary incontinence: Secondary | ICD-10-CM | POA: Diagnosis not present

## 2016-04-24 DIAGNOSIS — Z79899 Other long term (current) drug therapy: Secondary | ICD-10-CM | POA: Insufficient documentation

## 2016-04-24 DIAGNOSIS — Z803 Family history of malignant neoplasm of breast: Secondary | ICD-10-CM | POA: Diagnosis not present

## 2016-04-24 DIAGNOSIS — K5900 Constipation, unspecified: Secondary | ICD-10-CM | POA: Insufficient documentation

## 2016-04-24 DIAGNOSIS — C50412 Malignant neoplasm of upper-outer quadrant of left female breast: Secondary | ICD-10-CM

## 2016-04-24 DIAGNOSIS — Z7689 Persons encountering health services in other specified circumstances: Secondary | ICD-10-CM

## 2016-04-24 DIAGNOSIS — I700 Atherosclerosis of aorta: Secondary | ICD-10-CM

## 2016-04-24 DIAGNOSIS — R419 Unspecified symptoms and signs involving cognitive functions and awareness: Secondary | ICD-10-CM | POA: Insufficient documentation

## 2016-04-24 DIAGNOSIS — Z90710 Acquired absence of both cervix and uterus: Secondary | ICD-10-CM | POA: Diagnosis not present

## 2016-04-24 DIAGNOSIS — M129 Arthropathy, unspecified: Secondary | ICD-10-CM

## 2016-04-24 DIAGNOSIS — K219 Gastro-esophageal reflux disease without esophagitis: Secondary | ICD-10-CM | POA: Insufficient documentation

## 2016-04-24 DIAGNOSIS — Z87440 Personal history of urinary (tract) infections: Secondary | ICD-10-CM | POA: Diagnosis not present

## 2016-04-24 DIAGNOSIS — Z5111 Encounter for antineoplastic chemotherapy: Secondary | ICD-10-CM | POA: Diagnosis not present

## 2016-04-24 DIAGNOSIS — Z87891 Personal history of nicotine dependence: Secondary | ICD-10-CM

## 2016-04-24 DIAGNOSIS — E871 Hypo-osmolality and hyponatremia: Secondary | ICD-10-CM | POA: Diagnosis not present

## 2016-04-24 DIAGNOSIS — I10 Essential (primary) hypertension: Secondary | ICD-10-CM

## 2016-04-24 DIAGNOSIS — G9050 Complex regional pain syndrome I, unspecified: Secondary | ICD-10-CM

## 2016-04-24 DIAGNOSIS — Z85828 Personal history of other malignant neoplasm of skin: Secondary | ICD-10-CM | POA: Diagnosis not present

## 2016-04-24 LAB — CBC WITH DIFFERENTIAL/PLATELET
Basophils Absolute: 0.1 10*3/uL (ref 0–0.1)
Basophils Relative: 1 %
EOS ABS: 0.1 10*3/uL (ref 0–0.7)
Eosinophils Relative: 1 %
HCT: 34.6 % — ABNORMAL LOW (ref 35.0–47.0)
HEMOGLOBIN: 12.2 g/dL (ref 12.0–16.0)
LYMPHS ABS: 1.8 10*3/uL (ref 1.0–3.6)
Lymphocytes Relative: 17 %
MCH: 31.1 pg (ref 26.0–34.0)
MCHC: 35.2 g/dL (ref 32.0–36.0)
MCV: 88.2 fL (ref 80.0–100.0)
MONO ABS: 0.7 10*3/uL (ref 0.2–0.9)
MONOS PCT: 7 %
Neutro Abs: 8 10*3/uL — ABNORMAL HIGH (ref 1.4–6.5)
Neutrophils Relative %: 74 %
Platelets: 322 10*3/uL (ref 150–440)
RBC: 3.92 MIL/uL (ref 3.80–5.20)
RDW: 12.7 % (ref 11.5–14.5)
WBC: 10.7 10*3/uL (ref 3.6–11.0)

## 2016-04-24 LAB — COMPREHENSIVE METABOLIC PANEL
ALK PHOS: 64 U/L (ref 38–126)
ALT: 36 U/L (ref 14–54)
ANION GAP: 8 (ref 5–15)
AST: 31 U/L (ref 15–41)
Albumin: 3.9 g/dL (ref 3.5–5.0)
BILIRUBIN TOTAL: 0.4 mg/dL (ref 0.3–1.2)
BUN: 19 mg/dL (ref 6–20)
CALCIUM: 9.3 mg/dL (ref 8.9–10.3)
CO2: 26 mmol/L (ref 22–32)
Chloride: 93 mmol/L — ABNORMAL LOW (ref 101–111)
Creatinine, Ser: 1.19 mg/dL — ABNORMAL HIGH (ref 0.44–1.00)
GFR calc non Af Amer: 44 mL/min — ABNORMAL LOW (ref >60)
GFR, EST AFRICAN AMERICAN: 51 mL/min — AB (ref >60)
Glucose, Bld: 141 mg/dL — ABNORMAL HIGH (ref 65–99)
POTASSIUM: 3.6 mmol/L (ref 3.5–5.1)
SODIUM: 127 mmol/L — AB (ref 135–145)
TOTAL PROTEIN: 7.4 g/dL (ref 6.5–8.1)

## 2016-04-24 MED ORDER — DEXAMETHASONE SODIUM PHOSPHATE 10 MG/ML IJ SOLN
10.0000 mg | Freq: Once | INTRAMUSCULAR | Status: AC
  Administered 2016-04-24: 10 mg via INTRAVENOUS
  Filled 2016-04-24: qty 1

## 2016-04-24 MED ORDER — SODIUM CHLORIDE 0.9 % IV SOLN: 10.0000 mg | Freq: Once | INTRAVENOUS | Status: DC

## 2016-04-24 MED ORDER — SODIUM CHLORIDE 0.9 % IJ SOLN
10.0000 mL | Freq: Once | INTRAMUSCULAR | Status: AC
  Administered 2016-04-24: 10 mL via INTRAVENOUS
  Filled 2016-04-24: qty 10

## 2016-04-24 MED ORDER — HEPARIN SOD (PORK) LOCK FLUSH 100 UNIT/ML IV SOLN
500.0000 [IU] | Freq: Once | INTRAVENOUS | Status: AC
  Administered 2016-04-24: 500 [IU] via INTRAVENOUS
  Filled 2016-04-24: qty 5

## 2016-04-24 MED ORDER — DOCETAXEL CHEMO INJECTION 160 MG/16ML
75.0000 mg/m2 | Freq: Once | INTRAVENOUS | Status: AC
  Administered 2016-04-24: 140 mg via INTRAVENOUS
  Filled 2016-04-24: qty 14

## 2016-04-24 MED ORDER — SODIUM CHLORIDE 0.9 % IV SOLN
1100.0000 mg | Freq: Once | INTRAVENOUS | Status: AC
  Administered 2016-04-24: 1100 mg via INTRAVENOUS
  Filled 2016-04-24: qty 50

## 2016-04-24 MED ORDER — SODIUM CHLORIDE 0.9 % IV SOLN
Freq: Once | INTRAVENOUS | Status: AC
  Administered 2016-04-24: 10:00:00 via INTRAVENOUS
  Filled 2016-04-24: qty 1000

## 2016-04-24 MED ORDER — PEGFILGRASTIM 6 MG/0.6ML ~~LOC~~ PSKT
6.0000 mg | PREFILLED_SYRINGE | Freq: Once | SUBCUTANEOUS | Status: AC
  Administered 2016-04-24: 6 mg via SUBCUTANEOUS
  Filled 2016-04-24: qty 0.6

## 2016-04-24 MED ORDER — PALONOSETRON HCL INJECTION 0.25 MG/5ML
0.2500 mg | Freq: Once | INTRAVENOUS | Status: AC
  Administered 2016-04-24: 0.25 mg via INTRAVENOUS
  Filled 2016-04-24: qty 5

## 2016-04-24 NOTE — Progress Notes (Signed)
Complains of constipation and urinary incontinence. Has started stool softener BID which has helped. Pt has appt with urologist on 1/29 for incontinence.

## 2016-04-28 ENCOUNTER — Telehealth: Payer: Self-pay | Admitting: *Deleted

## 2016-04-28 NOTE — Telephone Encounter (Signed)
Notified Catherine Tate that patient has appointment with urologist on 05/11/2016.  Per Catherine Tate patient may go back on her preventative medication until she see urologist.  Spoken and notified patient of [name]'s comments. Patient verbalized understanding.

## 2016-04-28 NOTE — Telephone Encounter (Signed)
Did she get an appointment scheduled with the Urologist? If so then when?

## 2016-04-28 NOTE — Telephone Encounter (Signed)
Patient left a voicemail stating that she is finished with the antibiotic given for her UTI and wants to know if she should go back on her preventative medication?

## 2016-05-07 ENCOUNTER — Encounter: Payer: Self-pay | Admitting: Primary Care

## 2016-05-07 ENCOUNTER — Ambulatory Visit (INDEPENDENT_AMBULATORY_CARE_PROVIDER_SITE_OTHER): Payer: PPO | Admitting: Primary Care

## 2016-05-07 VITALS — BP 152/96 | HR 71 | Temp 98.1°F | Ht 66.0 in | Wt 162.4 lb

## 2016-05-07 DIAGNOSIS — C50412 Malignant neoplasm of upper-outer quadrant of left female breast: Secondary | ICD-10-CM

## 2016-05-07 DIAGNOSIS — Z170 Estrogen receptor positive status [ER+]: Secondary | ICD-10-CM

## 2016-05-07 DIAGNOSIS — N390 Urinary tract infection, site not specified: Secondary | ICD-10-CM | POA: Diagnosis not present

## 2016-05-07 DIAGNOSIS — I10 Essential (primary) hypertension: Secondary | ICD-10-CM | POA: Diagnosis not present

## 2016-05-07 LAB — POC URINALSYSI DIPSTICK (AUTOMATED)
BILIRUBIN UA: NEGATIVE
Glucose, UA: NEGATIVE
Ketones, UA: NEGATIVE
NITRITE UA: NEGATIVE
PH UA: 6
Spec Grav, UA: 1.015
UROBILINOGEN UA: NEGATIVE

## 2016-05-07 MED ORDER — LOSARTAN POTASSIUM 50 MG PO TABS: 50.0000 mg | ORAL_TABLET | Freq: Every day | ORAL | 0 refills | Status: DC

## 2016-05-07 NOTE — Assessment & Plan Note (Signed)
Overall doing okay, next chemo date is next week. Using Zofran PRN. Appetite overall good.

## 2016-05-07 NOTE — Progress Notes (Signed)
Subjective:    Patient ID: Catherine Tate, female    DOB: [birth year], 76 y.o.   MRN: [id number]  HPI  Catherine Tate is a 76 year old female who presents today for follow up.  1) Recurrent UTI's: Long history of, previously followed with Urology. Since establishing without our group she's had >3 UTI's and refused to see Urology. Her urine cultures are showing resistance to multiple medications. She was on Cipro as a preventative treatment but has since had several resistant cultures, so now she's on cephalexin. During her last visit she agreed to see a Dealer and is scheduled with [facility] 05/12/16. She is feeling improved from her recent UTI but has experienced some dysuria.   2) Essential Hypertension: Currently managed on HCTZ 25 mg. Her BP in the clinic today is 152/94. She's not checking her BP at home. She denies chest pain, dizziness, visual changes.  3) Breat Cancer: Found on routine screening mammogram in November 2017. She is currently following at [facility] with Dr. Grayland Ormond. She is undergoing chemotherapy and will also undergo lumpectomy to the left breast. Overall she's doing okay, she's experienced some weakness and nausea, and is using her nausea medication sparingly. She is due for another treatment next Thursday.   Review of Systems  Constitutional: Negative for fatigue.  Eyes: Negative for visual disturbance.  Genitourinary: Positive for dysuria. Negative for frequency, hematuria and vaginal discharge.  Neurological: Negative for weakness.       Past Medical History:  Diagnosis Date  . Arthritis   . Cancer ([place]) 03/13/2016   INVASIVE MAMMARY CARCINOMA  . Chickenpox   . Complication of anesthesia   . Frequent UTI   . GERD (gastroesophageal reflux disease)    RARE  . Hypertension   . PONV (postoperative nausea and vomiting)   . RSD (reflex sympathetic dystrophy)   . Seasonal allergies   . Skin cancer   . Urine incontinence      Social History    Social History  . Marital status: Single    Spouse name: N/A  . Number of children: N/A  . Years of education: N/A   Occupational History  . Not on file.   Social History Main Topics  . Smoking status: Former Smoker    Packs/day: 1.00    Years: 20.00    Types: Cigarettes    Quit date: 04/14/1994  . Smokeless tobacco: Never Used  . Alcohol use No  . Drug use: No  . Sexual activity: Not on file   Other Topics Concern  . Not on file   Social History Narrative   Single.   2 children, 1 grandchild.   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   Enjoys making jewelry, puzzle books, sewing, traveling.       Past Surgical History:  Procedure Laterality Date  . ABDOMINAL HYSTERECTOMY  2005   total/ Dr Davis Gourd  . BLADDER TUMOR EXCISION  2006  . BREAST BIOPSY Left 03/13/2016   INVASIVE MAMMARY CARCINOMA  . PORTACATH PLACEMENT N/A 04/01/2016   Procedure: INSERTION PORT-A-CATH;  Surgeon: [name], MD;  Location: [facility] ORS;  Service: General;  Laterality: N/A;  . SKIN CANCER EXCISION Right 2015   foot/ Dr Nevada Crane  . TUBAL LIGATION    . TUMOR EXCISION Right    FOOT    Family History  Problem Relation Age of Onset  . Emphysema Father   . Heart disease Mother   . Transient ischemic attack Mother   .  Breast cancer Cousin     paternal    Allergies  Allergen Reactions  . Bee Pollen Hives  . Mosquito (Diagnostic) Hives  . Other Other (See Comments)    Uncoded Allergy. Allergen: VICRYL  (90% glycolide and 10% L-lactide [polyglactin-- synthetic absorbable sterile surgical suture]), Other Reaction: Infection  . Gabapentin Swelling  . Norvasc [Amlodipine Besylate] Swelling    TO FEET  . Lisinopril Cough    Current Outpatient Prescriptions on File Prior to Visit  Medication Sig Dispense Refill  . acetaminophen (TYLENOL) 500 MG tablet Take 500 mg by mouth every 6 (six) hours as needed (for pain.).    Marland Kitchen hydrochlorothiazide (HYDRODIURIL) 25 MG tablet Take 1 tablet  (25 mg total) by mouth daily. 90 tablet 1  . lidocaine-prilocaine (EMLA) cream Apply 1 application topically as needed. Apply cream to port 1-2 hours prior to chemotherapy appointment. Cover with plastic wrap. 30 g 2  . ondansetron (ZOFRAN) 8 MG tablet Take 1 tablet (8 mg total) by mouth 2 (two) times daily as needed for refractory nausea / vomiting. Start on day 3 after chemo. 30 tablet 1  . prochlorperazine (COMPAZINE) 10 MG tablet Take 1 tablet (10 mg total) by mouth every 6 (six) hours as needed (Nausea or vomiting). 30 tablet 1   No current facility-administered medications on file prior to visit.     BP (!) 152/96   Pulse 71   Temp 98.1 F (36.7 C) (Oral)   Ht 5\' 6"  (1.676 m)   Wt 162 lb 6.4 oz (73.7 kg)   SpO2 94%   BMI 26.21 kg/m    Objective:   Physical Exam  Constitutional: She appears well-nourished. She does not appear ill.  Neck: Neck supple.  Cardiovascular: Normal rate and regular rhythm.   Pulmonary/Chest: Effort normal and breath sounds normal.  Abdominal: There is no CVA tenderness.  Skin: Skin is warm and dry.          Assessment & Plan:

## 2016-05-07 NOTE — Progress Notes (Signed)
Pre visit review using our clinic review tool, if applicable. No additional management support is needed unless otherwise documented below in the visit note. 

## 2016-05-07 NOTE — Patient Instructions (Signed)
Start Losartan 50 mg tablets for high blood pressure. Take 1 tablet by mouth daily.  Continue taking Hydrochlorothiazide 25 mg tablets for high blood pressure.  Check your blood pressure daily, around the same time of day, for the next 2 weeks.  Ensure that you have rested for 30 minutes prior to checking your blood pressure. Record your readings and I'll call you in 3 weeks.  Follow up with Urology as scheduled. I'll be in touch regarding your most recent urine test.  It was a pleasure to see you today!

## 2016-05-07 NOTE — Addendum Note (Signed)
Addended by: Jacqualin Combes on: 05/07/2016 10:00 AM   Modules accepted: Orders

## 2016-05-07 NOTE — Assessment & Plan Note (Signed)
No improvement on HCTZ alone. Add Losartan 50 mg. Will have her monitor BP, will check on readings in 3 weeks. BMP in 3 weeks.

## 2016-05-07 NOTE — Assessment & Plan Note (Signed)
Symptoms of dysuria today. Urine questionable, will send culture first. Scheduled with Urology in 1 week.

## 2016-05-11 LAB — URINE CULTURE

## 2016-05-12 ENCOUNTER — Other Ambulatory Visit: Payer: Self-pay | Admitting: Primary Care

## 2016-05-12 ENCOUNTER — Ambulatory Visit (INDEPENDENT_AMBULATORY_CARE_PROVIDER_SITE_OTHER): Payer: PPO | Admitting: Urology

## 2016-05-12 ENCOUNTER — Encounter: Payer: Self-pay | Admitting: Urology

## 2016-05-12 VITALS — BP 125/79 | HR 109 | Ht 66.0 in | Wt 163.5 lb

## 2016-05-12 DIAGNOSIS — N390 Urinary tract infection, site not specified: Secondary | ICD-10-CM

## 2016-05-12 DIAGNOSIS — R350 Frequency of micturition: Secondary | ICD-10-CM

## 2016-05-12 DIAGNOSIS — D494 Neoplasm of unspecified behavior of bladder: Secondary | ICD-10-CM

## 2016-05-12 DIAGNOSIS — N952 Postmenopausal atrophic vaginitis: Secondary | ICD-10-CM | POA: Diagnosis not present

## 2016-05-12 DIAGNOSIS — K5909 Other constipation: Secondary | ICD-10-CM

## 2016-05-12 DIAGNOSIS — N3946 Mixed incontinence: Secondary | ICD-10-CM

## 2016-05-12 DIAGNOSIS — R351 Nocturia: Secondary | ICD-10-CM | POA: Diagnosis not present

## 2016-05-12 LAB — BLADDER SCAN AMB NON-IMAGING: Scan Result: 0

## 2016-05-12 MED ORDER — NITROFURANTOIN MONOHYD MACRO 100 MG PO CAPS: 100.0000 mg | ORAL_CAPSULE | Freq: Two times a day (BID) | ORAL | 0 refills | Status: DC

## 2016-05-12 NOTE — Patient Instructions (Addendum)
                                             Urinary Tract Infection Prevention Patient Education Stay Hydrated: Urinary tract infections (UTIs) are less likely to occur in someone who is drinking enough water to promote regular urination, so it is very important to stay hydrated in order to help flush out bacteria from the urinary tract. Respond to "Nature's Call": It is always a good idea to urinate as soon as you feel the need. While "holding it in" does not directly cause an infection, it can cause overdistension that can damage the lining of the bladder, making it more vulnerable to bacteria. Remove Tampons Before Going: Remember to always take out tampons before urinating, and change tampons often.  Practice Proper Bathroom Hygiene: To keep bacteria near the urethral opening to a minimum, it is important to practice proper wiping techniques (i.e. front to back wiping) to help prevent rectal bacteria from entering the uretro-genital area. It can also be helpful to take showers and avoid soaking in the bathtub.  Take a Vitamin C Supplement: About 1,000 milligrams of vitamin C taken daily can help inhibit the growth of some bacteria by acidifying the urine. Maintain Control with Cranberries: Cranberries contain hippuronic acid, which is a natural antiseptic that may help prevent the adherence of bacteria to the bladder lining. Drinking 100% pure cranberry juice or taking over the counter cranberry supplements twice daily may help to prevent an infection. However, it is important to note that cranberry juices/supplements are not helpful once a urinary tract infection (UTI) is present. Strengthen Your Core: Often, a lazy bladder (unable to empty urine properly) occurs due to lower back problem, so consider doing exercises to help strengthen your back, pelvic floor, and stomach muscles.  Pay Attention to Your Urine: Your urine can change color for a variety of reasons, including from the medications you  take, so pay close attention to it to monitor your overall health. One key thing to note is that if your urine is typically a darker yellow, your body is dehydrated, so you need to step up your water intake.   Also start vaginal probiotics and apply coconut/olive oil for discomfort

## 2016-05-12 NOTE — Progress Notes (Addendum)
05/12/2016 9:57 AM   Charl[place] Ropes Bonney [place]oussel 05-21-1940 [id number]  Referring provider: [name], NP [address] [place], [place] [address]  Chief Complaint  Patient presents with  . New Patient (Initial Visit)    Recurrent UTI    HPI: Patient is a 76 -year-old Caucasian female who is referred to [place] by, [name], NP, for recurrent urinary tract infections.  Patient states that she has had four to five urinary tract infections over the last year.  She states it has gotten worse since starting her chemotherapy for breast cancer in 03/2016.  Her symptoms with a urinary tract infection consist of dysuria.  She denies gross hematuria, suprapubic pain, back pain, abdominal pain or flank pain.   She has not had any recent fevers, chills, nausea or vomiting.   She does not have a history of nephrolithiasis or GU trauma. She states that she had a "bladder tumor" removed in 2008 by Dr. Davis Gourd (gynecologist).  She has also seen Dr. Bernardo Heater.  She was told the "tumor" was not a cancer.    Reviewing her records,  she has had four documented positive urine cultures for E. Coli and Morganella morganii with variable resistance patterns.    She is not sexually active.  She is post menopausal.   She admits to constipation, but it is under control with stool softners.  She does engage in good perineal hygiene. She does not take tub baths.   She has incontinence and is using incontinence pads x 3-4 daily.    She is not having pain with bladder filling.    She has not had any recent imaging, but she did have a CT scan in 2007 and an irregular bladder wall contour was seen.    She is drinking 4 to 5 bottles of water daily.  She would like to drink more, but she has nausea and taste issues with the chemotherapy.   She does not drink cranberry juice or take the tablets.  She is not taking a probiotic.    She will be starting nitrofurantoin today for an E.Coli infection.  She was started  on Keflex 250 mg three times a week for suppressive antibiotics.  Her baseline urinary symptoms consists of frequency, nocturia x 3-4, incontinence and intermittency.  Her PVR today is 0 mL.    PMH: Past Medical History:  Diagnosis Date  . Arthritis   . Cancer ([place]) 03/13/2016   INVASIVE MAMMARY CARCINOMA  . Chickenpox   . Complication of anesthesia   . Frequent UTI   . GERD (gastroesophageal reflux disease)    RARE  . Hypertension   . PONV (postoperative nausea and vomiting)   . RSD (reflex sympathetic dystrophy)   . Seasonal allergies   . Skin cancer   . Urine incontinence     Surgical History: Past Surgical History:  Procedure Laterality Date  . ABDOMINAL HYSTERECTOMY  2005   total/ Dr Davis Gourd  . BLADDER TUMOR EXCISION  2006  . BREAST BIOPSY Left 03/13/2016   INVASIVE MAMMARY CARCINOMA  . PORTACATH PLACEMENT N/A 04/01/2016   Procedure: INSERTION PORT-A-CATH;  Surgeon: [name], MD;  Location: [facility] ORS;  Service: General;  Laterality: N/A;  . SKIN CANCER EXCISION Right 2015   foot/ Dr Nevada Crane  . TUBAL LIGATION    . TUMOR EXCISION Right    FOOT    Home Medications:  Allergies as of 05/12/2016      Reactions   Bee  Pollen Hives   Mosquito (diagnostic) Hives   Other Other (See Comments)   Uncoded Allergy. Allergen: VICRYL  (90% glycolide and 10% L-lactide [polyglactin-- synthetic absorbable sterile surgical suture]), Other Reaction: Infection   Gabapentin Swelling   Norvasc [amlodipine Besylate] Swelling   TO FEET   Lisinopril Cough      Medication List       Accurate as of 05/12/16  9:57 AM. Always use your most recent med list.          acetaminophen 500 MG tablet Commonly known as:  TYLENOL Take 500 mg by mouth every 6 (six) hours as needed (for pain.).   hydrochlorothiazide 25 MG tablet Commonly known as:  HYDRODIURIL Take 1 tablet (25 mg total) by mouth daily.   lidocaine-prilocaine cream Commonly known as:  EMLA Apply 1 application  topically as needed. Apply cream to port 1-2 hours prior to chemotherapy appointment. Cover with plastic wrap.   losartan 50 MG tablet Commonly known as:  COZAAR Take 1 tablet (50 mg total) by mouth daily.   nitrofurantoin (macrocrystal-monohydrate) 100 MG capsule Commonly known as:  MACROBID Take 1 capsule (100 mg total) by mouth 2 (two) times daily.   ondansetron 8 MG tablet Commonly known as:  ZOFRAN Take 1 tablet (8 mg total) by mouth 2 (two) times daily as needed for refractory nausea / vomiting. Start on day 3 after chemo.   prochlorperazine 10 MG tablet Commonly known as:  COMPAZINE Take 1 tablet (10 mg total) by mouth every 6 (six) hours as needed (Nausea or vomiting).       Allergies:  Allergies  Allergen Reactions  . Bee Pollen Hives  . Mosquito (Diagnostic) Hives  . Other Other (See Comments)    Uncoded Allergy. Allergen: VICRYL  (90% glycolide and 10% L-lactide [polyglactin-- synthetic absorbable sterile surgical suture]), Other Reaction: Infection  . Gabapentin Swelling  . Norvasc [Amlodipine Besylate] Swelling    TO FEET  . Lisinopril Cough    Family History: Family History  Problem Relation Age of Onset  . Emphysema Father   . Heart disease Mother   . Transient ischemic attack Mother   . Breast cancer Cousin     paternal    Social History:  reports that she quit smoking about 22 years ago. Her smoking use included Cigarettes. She has a 20.00 pack-year smoking history. She has never used smokeless tobacco. She reports that she does not drink alcohol or use drugs.  ROS: UROLOGY Frequent Urination?: Yes Hard to postpone urination?: No Burning/pain with urination?: Yes Get up at night to urinate?: Yes Leakage of urine?: Yes Urine stream starts and stops?: Yes Trouble starting stream?: No Do you have to strain to urinate?: No Blood in urine?: No Urinary tract infection?: Yes Sexually transmitted disease?: No Injury to kidneys or bladder?:  No Painful intercourse?: No Weak stream?: No Currently pregnant?: No Vaginal bleeding?: No Last menstrual period?: n  Gastrointestinal Nausea?: No Vomiting?: No Indigestion/heartburn?: No Diarrhea?: No Constipation?: No  Constitutional Fever: No Night sweats?: No Weight loss?: No Fatigue?: No  Skin Skin rash/lesions?: No Itching?: No  Eyes Blurred vision?: No Double vision?: No  Ears/Nose/Throat Sore throat?: No Sinus problems?: No  Hematologic/Lymphatic Swollen glands?: No Easy bruising?: No  Cardiovascular Leg swelling?: No Chest pain?: No  Respiratory Cough?: No Shortness of breath?: No  Endocrine Excessive thirst?: No  Musculoskeletal Back pain?: No Joint pain?: No  Neurological Headaches?: No Dizziness?: No  Psychologic Depression?: No Anxiety?: No  Physical Exam:  BP 125/79 (BP Location: Left Arm, Patient Position: Sitting, Cuff Size: Normal)   Pulse (!) 109   Ht 5\' 6"  (1.676 m)   Wt 163 lb 8 oz (74.2 kg)   BMI 26.39 kg/m   Constitutional: Well nourished. Alert and oriented, No acute distress. HEENT: [place] AT, moist mucus membranes. Trachea midline, no masses. Cardiovascular: No clubbing, cyanosis, or edema. Respiratory: Normal respiratory effort, no increased work of breathing. GI: Abdomen is soft, non tender, non distended, no abdominal masses. Liver and spleen not palpable.  No hernias appreciated.  Stool sample for occult testing is not indicated.   GU: No CVA tenderness.  No bladder fullness or masses.  Normal external genitalia, normal pubic hair distribution, no lesions.  Normal urethral meatus, no lesions, no prolapse, no discharge.   No urethral masses, tenderness and/or tenderness. No bladder fullness, tenderness or masses. Normal vagina mucosa, good estrogen effect, no discharge, no lesions, good pelvic support, Grade II cystocele is noted.  No rectocele noted.  Cervix, uterus and adnexa are surgically absent.  Anus and perineum  are without rashes or lesions.    Skin: No rashes, bruises or suspicious lesions. Lymph: No cervical or inguinal adenopathy. Neurologic: Grossly intact, no focal deficits, moving all 4 extremities. Psychiatric: Normal mood and affect.  Laboratory Data: Lab Results  Component Value Date   WBC 10.7 04/24/2016   HGB 12.2 04/24/2016   HCT 34.6 (L) 04/24/2016   MCV 88.2 04/24/2016   PLT 322 04/24/2016    Lab Results  Component Value Date   CREATININE 1.19 (H) 04/24/2016     Lab Results  Component Value Date   HGBA1C 5.7 10/29/2015       Component Value Date/Time   CHOL 221 (H) 10/29/2015 0750   HDL 44.10 10/29/2015 0750   CHOLHDL 5 10/29/2015 0750   VLDL 24.4 10/29/2015 0750   LDLCALC 153 (H) 10/29/2015 0750    Lab Results  Component Value Date   AST 31 04/24/2016   Lab Results  Component Value Date   ALT 36 04/24/2016   Pertinent Imaging: PRIOR REPORT IMPORTED FROM THE [employer] WORKFLOW SYSTEM   REASON FOR EXAM:    Hematuria and Pelvic Pain  COMMENTS:   PROCEDURE:     CT  - CT ABDOMEN / PELVIS  W/WO  - Apr 01 2006  2:52PM   RESULT:   REASON FOR CONSULTATION:     Hematuria and pelvic pain.   TECHNIQUE:  Axial images were obtained from the hemidiaphragm to the pubic  symphysis pre and post intravenous injection of contrast material.   FINDINGS:   On the unenhanced images, no renal or renal calculi are noted.  No hydronephrosis is identified.  Post intravenous injection of contrast,  both kidneys excrete the contrast.  No masses solid or cystic are noted.   On  delayed images there appears some irregularity of the posterior wall of  the  urinary bladder.  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  No  ascites  is present in the abdomen or pelvis. No adenopathy in the pelvis or  retroperitoneum.  The adrenal glands are intact.  The liver and spleen  appear intact.   IMPRESSION:   1.     No renal or ureteral calculi.  2.     On delayed  imaging there appears some irregularity of the posterior  wall of the urinary bladder.  Infection versus tumor would be of  consideration.  Direct visualization would be recommended.  Thank you for the opportunity to contribute to the care of your patient.      Assessment & Plan:    1. Recurrent UTI's  - Patient is instructed to increase her water intake until the urine is pale yellow or clear.  I have advised her to take probiotics (yogurt, oral pills or vaginal suppositories), take cranberry pills or drink the juice.  She is to take Vitamin C 1,000 mg daily to acidify the urine.  She should also avoid soaking in tubs and wipe front to back after urinating.  She may benefit from core strengthening exercises.  We can refer her to PT if she desires.    - Because of her history of recurrent UTIs, I have asked the patient to contact our office if she should experience symptoms of urinary tract infection so that we can CATH her for an urine specimen for urinalysis and culture. This is to prevent a skin contaminant from showing up in the urine culture.  If she should have her symptoms after hours or cannot get to our office, she should notify her other providers that she needs a catheterized specimen for UA and culture.   - I reviewed the symptoms of a urinary tract infection, such as a worsening of urinary urgency and frequency, dysuria, which is painful urination and not the pain of urine hitting sensitive perineal skin, hematuria, foul-smelling urine, suprapubic pain or mental status changes. Fevers, chills, nausea and or vomiting can also be signs of a possible UTI.  Positive urinalyses and positive urine cultures that are not associated with urinary symptoms should not be treated with antibiotics.    - I explained to the patient that being exposed to unnecessary antibiotics can put her at risk for increasing resistance of the bacteria to antibiotics, C. difficile and the side effects of the  antibiotics.    2. Vaginal atrophy  - patient not a candidate for vaginal estrogen cream as she has been diagnosed with breast cancer  - advised patient to use vaginal probiotics and olive/coconut oil for vaginal discomfort  - offered referral for vaginal physical therapy - will refer once she completes chemotherapy  3. Constipation   - patient is advised to continue Dulcolax OTC to achieve daily bowel movements  4. Nocturia  - I explained to the patient that nocturia is often multi-factorial and difficult to treat.  Sleeping disorders, heart conditions, peripheral vascular disease, diabetes, an enlarged prostate for men, an urethral stricture causing bladder outlet obstruction and/or certain medications can contribute to nocturia.  - I have suggested that the patient avoid caffeine after noon and alcohol in the evening.  He or she may also benefit from fluid restrictions after 6:00 in the evening and voiding just prior to bedtime.  - I have explained that research studies have showed that over 84% of patients with sleep apnea reported frequent nighttime urination.   With sleep apnea, oxygen decreases, carbon dioxide increases, the blood become more acidic, the heart rate drops and blood vessels in the lung constrict.  The body is then alerted that something is very wrong. The sleeper must wake enough to reopen the airway. By this time, the heart is racing and experiences a false signal of fluid overload. The heart excretes a hormone-like protein that tells the body to get rid of sodium and water, resulting in nocturia.  -  I also informed the patient that a recent study noted that decreasing sodium intake to 2.3 grams daily, if they  don't have issues with hyponatremia, can also reduce the number of nightly voids  - The patient may benefit from a discussion with his or her primary care physician to see if he or she has risk factors for sleep apnea or other sleep disturbances and obtaining a sleep  study.  5. Mixed incontinence  - with have a trial of Myrbetriq 25 mg daily; I have advised the patient of the side effects of Myrbetriq, such as: elevation in BP, urinary retention and/or HA.                        6. Bladder tumor  - will request records from Dr. Dene Gentry office   Return in about 3 weeks (around 06/02/2016) for PVR and OAB questionnaire.  These notes generated with voice recognition software. I apologize for typographical errors.  Zara Council, [facility] [address], [place], [place] [address] [phone]

## 2016-05-14 NOTE — Progress Notes (Signed)
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  INTERVAL HISTORY: Patient returns to clinic today for further evaluation and consideration of cycle 3 of 4 of neoadjuvant Taxotere and Cytoxan. She reports a mild rash on her arms that does not itch.  She also has some incontinence, started taking Myrbetriq on Monday, and is being followed by urology.  Burning urination that she had last week has resolved with macrobid, being followed by her PCP.  She has a "so-so" appetite, and reports a 6-pound weight loss over 3 months. She reports occasional insomnia, difficulty getting back to sleep once awakened. She otherwise feels well and is asymptomatic. She has no neurologic complaints. She denies any recent fevers, chills, or illnesses. She has no chest pain or shortness of breath. She denies any nausea, vomiting, constipation, or diarrhea. Patient offers no further specific complaints today.  REVIEW OF SYSTEMS:   Review of Systems  Constitutional: Negative.  Negative for fever, malaise/fatigue and weight loss.  HENT: Negative for sinus pain.   Respiratory: Negative.  Negative for cough and shortness of breath.   Cardiovascular: Negative.  Negative for chest pain and leg swelling.  Gastrointestinal: Negative.  Negative for abdominal pain and constipation.  Genitourinary: Positive for urgency. Negative for hematuria.  Musculoskeletal: Negative.   Skin: Positive for rash. Negative for itching.  Neurological: Negative.  Negative for weakness and headaches.  Psychiatric/Behavioral: The patient is  nervous/anxious and has insomnia.     As per HPI. Otherwise, a complete review of systems is negative.  PAST MEDICAL HISTORY: Past Medical History:  Diagnosis Date  . Arthritis   . Cancer ([place]) 03/13/2016   INVASIVE MAMMARY CARCINOMA  . Chickenpox   . Complication of anesthesia   . Frequent UTI   . GERD (gastroesophageal reflux disease)    RARE  . Hypertension   . PONV (postoperative nausea and vomiting)   . RSD (reflex sympathetic dystrophy)   . Seasonal allergies   . Skin cancer   . Urine incontinence     PAST SURGICAL HISTORY: Past Surgical History:  Procedure Laterality Date  . ABDOMINAL HYSTERECTOMY  2005   total/ Dr Davis Gourd  . BLADDER TUMOR EXCISION  2006  . BREAST BIOPSY Left 03/13/2016   INVASIVE MAMMARY CARCINOMA  . PORTACATH PLACEMENT N/A 04/01/2016   Procedure: INSERTION PORT-A-CATH;  Surgeon: [name], MD;  Location: [facility] ORS;  Service: General;  Laterality: N/A;  . SKIN CANCER EXCISION Right 2015   foot/ Dr [place] Crane  . TUBAL LIGATION    . TUMOR EXCISION Right    FOOT    FAMILY HISTORY: Family History  Problem Relation Age of Onset  . Emphysema Father   . Heart disease Mother   . Transient ischemic attack Mother   . Breast cancer Cousin     paternal    ADVANCED DIRECTIVES (Y/N):  N  HEALTH MAINTENANCE: Social History  Substance Use Topics  . Smoking status: Former Smoker    Packs/day: 1.00    Years: 20.00    Types: Cigarettes    Quit date: 04/14/1994  . Smokeless tobacco: Never Used  . Alcohol use No  Colonoscopy:  PAP:  Bone density:  Lipid panel:  Allergies  Allergen Reactions  . Bee Pollen Hives  . Mosquito (Diagnostic) Hives  . Other Other (See Comments)    Uncoded Allergy. Allergen: VICRYL  (90% glycolide and 10% L-lactide [polyglactin-- synthetic absorbable sterile surgical suture]), Other Reaction: Infection  . Gabapentin Swelling  . Norvasc [Amlodipine Besylate] Swelling    TO FEET  . Lisinopril Cough     Current Outpatient Prescriptions  Medication Sig Dispense Refill  . acetaminophen (TYLENOL) 500 MG tablet Take 500 mg by mouth every 6 (six) hours as needed (for pain.).    Marland Kitchen hydrochlorothiazide (HYDRODIURIL) 25 MG tablet Take 1 tablet (25 mg total) by mouth daily. 90 tablet 1  . lidocaine-prilocaine (EMLA) cream Apply 1 application topically as needed. Apply cream to port 1-2 hours prior to chemotherapy appointment. Cover with plastic wrap. 30 g 2  . losartan (COZAAR) 50 MG tablet Take 1 tablet (50 mg total) by mouth daily. 90 tablet 0  . mirabegron ER (MYRBETRIQ) 25 MG TB24 tablet Take 25 mg by mouth daily.    . nitrofurantoin, macrocrystal-monohydrate, (MACROBID) 100 MG capsule Take 1 capsule (100 mg total) by mouth 2 (two) times daily. 14 capsule 0  . ondansetron (ZOFRAN) 8 MG tablet Take 1 tablet (8 mg total) by mouth 2 (two) times daily as needed for refractory nausea / vomiting. Start on day 3 after chemo. 30 tablet 1  . prochlorperazine (COMPAZINE) 10 MG tablet Take 1 tablet (10 mg total) by mouth every 6 (six) hours as needed (Nausea or vomiting). 30 tablet 1   No current facility-administered medications for this visit.     OBJECTIVE: Vitals:   05/15/16 0910  BP: (!) 141/76  Pulse: 76  Temp: 97.5 F (36.4 C)     Body mass index is 26.12 kg/m.    ECOG FS:0 - Asymptomatic  General: Well-developed, well-nourished, no acute distress. Eyes: Pink conjunctiva, anicteric sclera. Breasts: Palpable left breast nodule. Exam deferred today. Lungs: Clear to auscultation bilaterally. Heart: Regular rate and rhythm. No rubs, murmurs, or gallops. Abdomen: Soft, nontender, nondistended. No organomegaly noted, normoactive bowel sounds. Musculoskeletal: No edema, cyanosis, or clubbing. Neuro: Alert, answering all questions appropriately. Cranial nerves grossly intact. Skin: Diffuse small red papules, approximately 1/2 cm diameter, on bilateral arms. Psych: Normal affect.   LAB  RESULTS:  Lab Results  Component Value Date   NA 130 (L) 05/15/2016   K 3.6 05/15/2016   CL 97 (L) 05/15/2016   CO2 26 05/15/2016   GLUCOSE 127 (H) 05/15/2016   BUN 15 05/15/2016   CREATININE 0.95 05/15/2016   CALCIUM 9.0 05/15/2016   PROT 6.8 05/15/2016   ALBUMIN 3.9 05/15/2016   AST 27 05/15/2016   ALT 25 05/15/2016   ALKPHOS 57 05/15/2016   BILITOT 0.6 05/15/2016   GFRNONAA 58 (L) 05/15/2016   GFRAA >60 05/15/2016    Lab Results  Component Value Date   WBC 4.6 05/15/2016   NEUTROABS 2.7 05/15/2016   HGB 11.8 (L) 05/15/2016   HCT 33.7 (L) 05/15/2016   MCV 89.7 05/15/2016   PLT 260 05/15/2016     STUDIES: No results found.  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  PLAN:    1. 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 FISH was negative for HER-2 therefore she does not require Herceptin. Also, given her EF of 41% cannot use Adriamycin. Proceed with cycle 3 of 4 of neoadjuvant Taxotere and Cytoxan. Will refer her back to surgery at the conclusion of her chemotherapy. Given the ER/PR status of patient's tumor, she will benefit from an aromatase inhibitor at the conclusion of all her treatments. She will also require adjuvant XRT if she undergoes lumpectomy. Return to clinic in 3 weeks for consideration of cycle 4.  2. Hyponatremia: Improving. Possibly secondary to mild dehydration, monitor. 3. Constipation: Continue OTC treatments as needed. 4. Incontinence: Patient started taking Myrbetriq on May 12, 2016, being followed by urology. 5. Rash: Unclear etiology. Continue to use OTC treatments. Will monitor. 6. Insomnia/Anxiety: Discussed 4-7-8 breathing technique at bedtime: breathe in  to count of 4, hold breath for count of 7, exhale for count of 8; do 3-5 times for letting go of overactive thoughts.  Provided breath ratio chart to patient for relaxing, balancing, and energizing breathing techniques.   Patient expressed understanding and was in agreement with this plan. She also understands that She can call clinic at any time with any questions, concerns, or complaints.   Cancer Staging Malignant neoplasm of upper-outer quadrant of left female breast [facility]) Staging form: Breast, AJCC 7th Edition - Clinical stage from 03/19/2016: Stage IIA (T2, N0, M0) - Signed by Lloyd Huger, MD on 04/06/2016  Lucendia Herrlich, NP  05/18/16 8:04 AM  Patient was seen and evaluated independently and I agree with the assessment and plan as dictated above.  Lloyd Huger, MD 05/18/16 8:06 AM

## 2016-05-15 ENCOUNTER — Inpatient Hospital Stay: Payer: PPO | Attending: Oncology

## 2016-05-15 ENCOUNTER — Inpatient Hospital Stay (HOSPITAL_BASED_OUTPATIENT_CLINIC_OR_DEPARTMENT_OTHER): Payer: PPO | Admitting: Oncology

## 2016-05-15 ENCOUNTER — Inpatient Hospital Stay: Payer: PPO

## 2016-05-15 VITALS — BP 141/76 | HR 76 | Temp 97.5°F | Ht 66.0 in | Wt 161.8 lb

## 2016-05-15 DIAGNOSIS — M129 Arthropathy, unspecified: Secondary | ICD-10-CM | POA: Diagnosis not present

## 2016-05-15 DIAGNOSIS — R32 Unspecified urinary incontinence: Secondary | ICD-10-CM | POA: Insufficient documentation

## 2016-05-15 DIAGNOSIS — Z5111 Encounter for antineoplastic chemotherapy: Secondary | ICD-10-CM | POA: Insufficient documentation

## 2016-05-15 DIAGNOSIS — C50412 Malignant neoplasm of upper-outer quadrant of left female breast: Secondary | ICD-10-CM

## 2016-05-15 DIAGNOSIS — G9050 Complex regional pain syndrome I, unspecified: Secondary | ICD-10-CM | POA: Diagnosis not present

## 2016-05-15 DIAGNOSIS — Z170 Estrogen receptor positive status [ER+]: Secondary | ICD-10-CM

## 2016-05-15 DIAGNOSIS — R21 Rash and other nonspecific skin eruption: Secondary | ICD-10-CM | POA: Insufficient documentation

## 2016-05-15 DIAGNOSIS — Z85828 Personal history of other malignant neoplasm of skin: Secondary | ICD-10-CM

## 2016-05-15 DIAGNOSIS — R630 Anorexia: Secondary | ICD-10-CM | POA: Diagnosis not present

## 2016-05-15 DIAGNOSIS — K5900 Constipation, unspecified: Secondary | ICD-10-CM

## 2016-05-15 DIAGNOSIS — I10 Essential (primary) hypertension: Secondary | ICD-10-CM | POA: Insufficient documentation

## 2016-05-15 DIAGNOSIS — Z87440 Personal history of urinary (tract) infections: Secondary | ICD-10-CM

## 2016-05-15 DIAGNOSIS — E871 Hypo-osmolality and hyponatremia: Secondary | ICD-10-CM

## 2016-05-15 DIAGNOSIS — Z79899 Other long term (current) drug therapy: Secondary | ICD-10-CM | POA: Insufficient documentation

## 2016-05-15 DIAGNOSIS — R300 Dysuria: Secondary | ICD-10-CM

## 2016-05-15 DIAGNOSIS — Z7689 Persons encountering health services in other specified circumstances: Secondary | ICD-10-CM | POA: Diagnosis not present

## 2016-05-15 DIAGNOSIS — Z87891 Personal history of nicotine dependence: Secondary | ICD-10-CM | POA: Diagnosis not present

## 2016-05-15 DIAGNOSIS — G4700 Insomnia, unspecified: Secondary | ICD-10-CM | POA: Diagnosis not present

## 2016-05-15 DIAGNOSIS — R634 Abnormal weight loss: Secondary | ICD-10-CM | POA: Insufficient documentation

## 2016-05-15 DIAGNOSIS — K219 Gastro-esophageal reflux disease without esophagitis: Secondary | ICD-10-CM | POA: Insufficient documentation

## 2016-05-15 LAB — CBC WITH DIFFERENTIAL/PLATELET
BASOS ABS: 0 10*3/uL (ref 0–0.1)
Basophils Relative: 0 %
Eosinophils Absolute: 0 10*3/uL (ref 0–0.7)
Eosinophils Relative: 0 %
HEMATOCRIT: 33.7 % — AB (ref 35.0–47.0)
Hemoglobin: 11.8 g/dL — ABNORMAL LOW (ref 12.0–16.0)
LYMPHS PCT: 29 %
Lymphs Abs: 1.3 10*3/uL (ref 1.0–3.6)
MCH: 31.3 pg (ref 26.0–34.0)
MCHC: 34.9 g/dL (ref 32.0–36.0)
MCV: 89.7 fL (ref 80.0–100.0)
Monocytes Absolute: 0.5 10*3/uL (ref 0.2–0.9)
Monocytes Relative: 12 %
NEUTROS PCT: 59 %
Neutro Abs: 2.7 10*3/uL (ref 1.4–6.5)
PLATELETS: 260 10*3/uL (ref 150–440)
RBC: 3.75 MIL/uL — AB (ref 3.80–5.20)
RDW: 15.2 % — ABNORMAL HIGH (ref 11.5–14.5)
WBC: 4.6 10*3/uL (ref 3.6–11.0)

## 2016-05-15 LAB — COMPREHENSIVE METABOLIC PANEL
ALT: 25 U/L (ref 14–54)
AST: 27 U/L (ref 15–41)
Albumin: 3.9 g/dL (ref 3.5–5.0)
Alkaline Phosphatase: 57 U/L (ref 38–126)
Anion gap: 7 (ref 5–15)
BILIRUBIN TOTAL: 0.6 mg/dL (ref 0.3–1.2)
BUN: 15 mg/dL (ref 6–20)
CHLORIDE: 97 mmol/L — AB (ref 101–111)
CO2: 26 mmol/L (ref 22–32)
CREATININE: 0.95 mg/dL (ref 0.44–1.00)
Calcium: 9 mg/dL (ref 8.9–10.3)
GFR calc Af Amer: >60 mL/min (ref >60)
GFR, EST NON AFRICAN AMERICAN: 58 mL/min — AB (ref >60)
Glucose, Bld: 127 mg/dL — ABNORMAL HIGH (ref 65–99)
Potassium: 3.6 mmol/L (ref 3.5–5.1)
Sodium: 130 mmol/L — ABNORMAL LOW (ref 135–145)
Total Protein: 6.8 g/dL (ref 6.5–8.1)

## 2016-05-15 MED ORDER — DEXAMETHASONE SODIUM PHOSPHATE 10 MG/ML IJ SOLN
10.0000 mg | Freq: Once | INTRAMUSCULAR | Status: AC
  Administered 2016-05-15: 10 mg via INTRAVENOUS
  Filled 2016-05-15: qty 1

## 2016-05-15 MED ORDER — SODIUM CHLORIDE 0.9 % IV SOLN
1100.0000 mg | Freq: Once | INTRAVENOUS | Status: AC
  Administered 2016-05-15: 1100 mg via INTRAVENOUS
  Filled 2016-05-15: qty 50

## 2016-05-15 MED ORDER — PALONOSETRON HCL INJECTION 0.25 MG/5ML
0.2500 mg | Freq: Once | INTRAVENOUS | Status: AC
  Administered 2016-05-15: 0.25 mg via INTRAVENOUS
  Filled 2016-05-15: qty 5

## 2016-05-15 MED ORDER — PEGFILGRASTIM 6 MG/0.6ML ~~LOC~~ PSKT
6.0000 mg | PREFILLED_SYRINGE | Freq: Once | SUBCUTANEOUS | Status: AC
  Administered 2016-05-15: 6 mg via SUBCUTANEOUS
  Filled 2016-05-15: qty 0.6

## 2016-05-15 MED ORDER — HEPARIN SOD (PORK) LOCK FLUSH 100 UNIT/ML IV SOLN
500.0000 [IU] | Freq: Once | INTRAVENOUS | Status: AC | PRN
  Administered 2016-05-15: 500 [IU]
  Filled 2016-05-15: qty 5

## 2016-05-15 MED ORDER — SODIUM CHLORIDE 0.9 % IV SOLN
Freq: Once | INTRAVENOUS | Status: AC
  Administered 2016-05-15: 10:00:00 via INTRAVENOUS
  Filled 2016-05-15: qty 1000

## 2016-05-15 MED ORDER — DOCETAXEL CHEMO INJECTION 160 MG/16ML
75.0000 mg/m2 | Freq: Once | INTRAVENOUS | Status: AC
  Administered 2016-05-15: 140 mg via INTRAVENOUS
  Filled 2016-05-15: qty 14

## 2016-05-15 NOTE — Progress Notes (Signed)
Patient here for pre treament check she has a light red rash on her body since her last chemo. She deiies itching.

## 2016-05-26 ENCOUNTER — Telehealth: Payer: Self-pay | Admitting: Primary Care

## 2016-05-26 NOTE — Telephone Encounter (Addendum)
-----   Message from [name], NP sent at 05/07/2016  8:17 AM EST ----- Regarding: BP and BMP Please check on patient's blood pressure since starting Lisinopril 10 mg.

## 2016-05-26 NOTE — Telephone Encounter (Signed)
Message left for patient to return my call.  

## 2016-05-28 NOTE — Telephone Encounter (Signed)
Spoken to patient. She stated that it was checked when she had chemo--147/86, 135/77, and 132/81

## 2016-05-28 NOTE — Telephone Encounter (Signed)
Noted. Stable BP.

## 2016-06-01 NOTE — Progress Notes (Signed)
06/02/2016 9:20 AM   Catherine Tate 1940-11-06 [id number]  Referring provider: [name], NP [place], [place] [address]  Chief Complaint  Patient presents with  . Over Active Bladder    3wk follow up    HPI: 76 yo WF who then started today for symptom recheck and PVR after initiating Myrbetriq 25 mg daily for urge incontinence.  Background history Patient is a 67 -year-old Caucasian female who is referred to [place] by, [name], NP, for recurrent urinary tract infections.   Patient states that she has had four to five urinary tract infections over the last year.  She states it has gotten worse since starting her chemotherapy for breast cancer in 03/2016.  Her symptoms with a urinary tract infection consist of dysuria.  She denies gross hematuria, suprapubic pain, back pain, abdominal pain or flank pain.   She has not had any recent fevers, chills, nausea or vomiting.   She does not have a history of nephrolithiasis or GU trauma. She states that she had a "bladder tumor" removed in 2008 by Dr. Davis Gourd (gynecologist).  She has also seen Dr. Bernardo Heater.  She was told the "tumor" was not a cancer.   Reviewing her records,  she has had four documented positive urine cultures for E. Coli and Morganella morganii with variable resistance patterns.  She is not sexually active.  She is post menopausal.  She admits to constipation, but it is under control with stool softners.  She does engage in good perineal hygiene. She does not take tub baths.   She has incontinence and is using incontinence pads x 3-4 daily.  She is not having pain with bladder filling.  She has not had any recent imaging, but she did have a CT scan in 2007 and an irregular bladder wall contour was seen.  She is drinking 4 to 5 bottles of water daily.  She would like to drink more, but she has nausea and taste issues with the chemotherapy.   She does not drink cranberry juice or take the tablets.  She is  not taking a probiotic.  She will be starting nitrofurantoin today for an E.Coli infection.  She was started on Keflex 250 mg three times a week for suppressive antibiotics.  Her baseline urinary symptoms consists of frequency, nocturia x 3-4, incontinence and intermittency.  Her PVR today is 0 mL.    Today, patient complains of urgency x 3, engaging in toilet mapping, incontinence x 8 and nocturia x 3.   She is taking cranberry tablets, but she has not started a vaginal probiotic.  She states the folks she spoke with at [facility] had no idea what she was talking about.  She was given vaginal lubricants instead that burned when she applied it to her vagina.   She denies gross hematuria, dysuria and suprapubic pain.  She has not had any fevers, chills, nausea or vomiting.    She has not had any documented UTI's since her visit with [place] three weeks ago.     PMH: Past Medical History:  Diagnosis Date  . Arthritis   . Cancer ([place]) 03/13/2016   INVASIVE MAMMARY CARCINOMA  . Chickenpox   . Complication of anesthesia   . Frequent UTI   . GERD (gastroesophageal reflux disease)    RARE  . Hypertension   . PONV (postoperative nausea and vomiting)   . RSD (reflex sympathetic dystrophy)   . Seasonal allergies   .  Skin cancer   . Urine incontinence     Surgical History: Past Surgical History:  Procedure Laterality Date  . ABDOMINAL HYSTERECTOMY  2005   total/ Dr Davis Gourd  . BLADDER TUMOR EXCISION  2006  . BREAST BIOPSY Left 03/13/2016   INVASIVE MAMMARY CARCINOMA  . PORTACATH PLACEMENT N/A 04/01/2016   Procedure: INSERTION PORT-A-CATH;  Surgeon: [name], MD;  Location: [facility] ORS;  Service: General;  Laterality: N/A;  . SKIN CANCER EXCISION Right 2015   foot/ Dr [place] Crane  . TUBAL LIGATION    . TUMOR EXCISION Right    FOOT    Home Medications:  Allergies as of 06/02/2016      Reactions   Bee Pollen Hives   Mosquito (diagnostic) Hives   Other Other (See Comments)   Uncoded Allergy.  Allergen: VICRYL  (90% glycolide and 10% L-lactide [polyglactin-- synthetic absorbable sterile surgical suture]), Other Reaction: Infection   Gabapentin Swelling   Norvasc [amlodipine Besylate] Swelling   TO FEET   Lisinopril Cough      Medication List       Accurate as of 06/02/16  9:20 AM. Always use your most recent med list.          acetaminophen 500 MG tablet Commonly known as:  TYLENOL Take 500 mg by mouth every 6 (six) hours as needed (for pain.).   hydrochlorothiazide 25 MG tablet Commonly known as:  HYDRODIURIL Take 1 tablet (25 mg total) by mouth daily.   lidocaine-prilocaine cream Commonly known as:  EMLA Apply 1 application topically as needed. Apply cream to port 1-2 hours prior to chemotherapy appointment. Cover with plastic wrap.   losartan 50 MG tablet Commonly known as:  COZAAR Take 1 tablet (50 mg total) by mouth daily.   mirabegron ER 25 MG Tb24 tablet Commonly known as:  MYRBETRIQ Take 1 tablet (25 mg total) by mouth daily.   nitrofurantoin (macrocrystal-monohydrate) 100 MG capsule Commonly known as:  MACROBID Take 1 capsule (100 mg total) by mouth 2 (two) times daily.   ondansetron 8 MG tablet Commonly known as:  ZOFRAN Take 1 tablet (8 mg total) by mouth 2 (two) times daily as needed for refractory nausea / vomiting. Start on day 3 after chemo.   prochlorperazine 10 MG tablet Commonly known as:  COMPAZINE Take 1 tablet (10 mg total) by mouth every 6 (six) hours as needed (Nausea or vomiting).       Allergies:  Allergies  Allergen Reactions  . Bee Pollen Hives  . Mosquito (Diagnostic) Hives  . Other Other (See Comments)    Uncoded Allergy. Allergen: VICRYL  (90% glycolide and 10% L-lactide [polyglactin-- synthetic absorbable sterile surgical suture]), Other Reaction: Infection  . Gabapentin Swelling  . Norvasc [Amlodipine Besylate] Swelling    TO FEET  . Lisinopril Cough    Family History: Family History  Problem Relation Age of  Onset  . Emphysema Father   . Heart disease Mother   . Transient ischemic attack Mother   . Breast cancer Cousin     paternal    Social History:  reports that she quit smoking about 22 years ago. Her smoking use included Cigarettes. She has a 20.00 pack-year smoking history. She has never used smokeless tobacco. She reports that she does not drink alcohol or use drugs.  ROS: UROLOGY Frequent Urination?: Yes Hard to postpone urination?: No Burning/pain with urination?: No Get up at night to urinate?: No Leakage of urine?: No Urine stream starts and stops?: No Trouble  starting stream?: No Do you have to strain to urinate?: No Blood in urine?: No Urinary tract infection?: Yes Sexually transmitted disease?: No Injury to kidneys or bladder?: No Painful intercourse?: No Weak stream?: No Currently pregnant?: No Vaginal bleeding?: No Last menstrual period?: n  Gastrointestinal Nausea?: No Vomiting?: No Indigestion/heartburn?: No Diarrhea?: No Constipation?: No  Constitutional Fever: No Night sweats?: No Weight loss?: No Fatigue?: No  Skin Skin rash/lesions?: No Itching?: No  Eyes Blurred vision?: No Double vision?: No  Ears/Nose/Throat Sore throat?: No Sinus problems?: No  Hematologic/Lymphatic Swollen glands?: No Easy bruising?: No  Cardiovascular Leg swelling?: No Chest pain?: No  Respiratory Cough?: No Shortness of breath?: No  Endocrine Excessive thirst?: No  Musculoskeletal Back pain?: No Joint pain?: No  Neurological Headaches?: No Dizziness?: No  Psychologic Depression?: No Anxiety?: No  Physical Exam: BP 130/71   Pulse (!) 120   Ht 5\' 7"  (1.702 m)   Wt 160 lb (72.6 kg)   SpO2 98%   BMI 25.06 kg/m   Constitutional: Well nourished. Alert and oriented, No acute distress. HEENT: [place] AT, moist mucus membranes. Trachea midline, no masses. Cardiovascular: No clubbing, cyanosis, or edema. Respiratory: Normal respiratory effort, no  increased work of breathing. Skin: No rashes, bruises or suspicious lesions. Lymph: No cervical or inguinal adenopathy. Neurologic: Grossly intact, no focal deficits, moving all 4 extremities. Psychiatric: Normal mood and affect.  Laboratory Data: Lab Results  Component Value Date   WBC 4.6 05/15/2016   HGB 11.8 (L) 05/15/2016   HCT 33.7 (L) 05/15/2016   MCV 89.7 05/15/2016   PLT 260 05/15/2016    Lab Results  Component Value Date   CREATININE 0.95 05/15/2016     Lab Results  Component Value Date   HGBA1C 5.7 10/29/2015       Component Value Date/Time   CHOL 221 (H) 10/29/2015 0750   HDL 44.10 10/29/2015 0750   CHOLHDL 5 10/29/2015 0750   VLDL 24.4 10/29/2015 0750   LDLCALC 153 (H) 10/29/2015 0750    Lab Results  Component Value Date   AST 27 05/15/2016   Lab Results  Component Value Date   ALT 25 05/15/2016   Pertinent Imaging: PRIOR REPORT IMPORTED FROM THE SYNGO WORKFLOW SYSTEM   REASON FOR EXAM:    Hematuria and Pelvic Pain  COMMENTS:   PROCEDURE:     CT  - CT ABDOMEN / PELVIS  W/WO  - Apr 01 2006  2:52PM   RESULT:   REASON FOR CONSULTATION:     Hematuria and pelvic pain.   TECHNIQUE:  Axial images were obtained from the hemidiaphragm to the pubic  symphysis pre and post intravenous injection of contrast material.   FINDINGS:   On the unenhanced images, no renal or renal calculi are noted.  No hydronephrosis is identified.  Post intravenous injection of contrast,  both kidneys excrete the contrast.  No masses solid or cystic are noted.   On  delayed images there appears some irregularity of the posterior wall of  the  urinary bladder.  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  No  ascites  is present in the abdomen or pelvis. No adenopathy in the pelvis or  retroperitoneum.  The adrenal glands are intact.  The liver and spleen  appear intact.   IMPRESSION:   1.     No renal or ureteral calculi.  2.     On delayed imaging  there appears some irregularity of the posterior  wall of  the urinary bladder.  Infection versus tumor would be of  consideration.  Direct visualization would be recommended.   Thank you for the opportunity to contribute to the care of your patient.    Results for ARRIKA, SEDLER (MRN [id number]) as of 06/02/2016 08:57  Ref. Range 06/02/2016 08:42  Scan Result Unknown 91ml    Assessment & Plan:    1. Recurrent UTI's  - reviewed UTI prevention strategies with the patient  - she is taking cranberry tablets  - encouraged Vitamin C 1000 mg daily  - discontinue the suppressive antibiotics at this time  - Reminded patient that she needs to contact our office if she should experience symptoms of urinary tract infection so that we can CATH her for an urine specimen for urinalysis and culture. This is to prevent a skin contaminant from showing up in the urine culture.  If she should have her symptoms after hours or cannot get to our office, she should notify her other providers that she needs a catheterized specimen for UA and culture.    2. Vaginal atrophy  - patient not a candidate for vaginal estrogen cream as she has been diagnosed with breast cancer  - advised patient to use vaginal probiotics and olive/coconut oil for vaginal discomfort  - offered referral for vaginal physical therapy - will refer once she completes chemotherapy  3. Constipation   - patient is advised to continue Dulcolax OTC to achieve daily bowel movements  4. Nocturia  - Myrbetriq 25 mg daily has slowed her nocturia down from 4 times nightly to 2 times nightly  5. Mixed incontinence  - Myrbetriq 25 mg daily has provided benefit and she would like to continue the medication  - RTC in 3 months for PVR and OAB questionnaire.           6. Bladder tumor  - patient has brought records from Dr. 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  These notes generated with voice recognition software. I apologize for typographical errors.  Zara Council, [facility] [address] [place],  [address] [phone]

## 2016-06-02 ENCOUNTER — Ambulatory Visit: Payer: PPO | Admitting: Urology

## 2016-06-02 ENCOUNTER — Encounter: Payer: Self-pay | Admitting: Urology

## 2016-06-02 VITALS — BP 130/71 | HR 120 | Ht 67.0 in | Wt 160.0 lb

## 2016-06-02 DIAGNOSIS — K5909 Other constipation: Secondary | ICD-10-CM

## 2016-06-02 DIAGNOSIS — D494 Neoplasm of unspecified behavior of bladder: Secondary | ICD-10-CM | POA: Diagnosis not present

## 2016-06-02 DIAGNOSIS — R3981 Functional urinary incontinence: Secondary | ICD-10-CM | POA: Diagnosis not present

## 2016-06-02 DIAGNOSIS — R351 Nocturia: Secondary | ICD-10-CM

## 2016-06-02 DIAGNOSIS — N952 Postmenopausal atrophic vaginitis: Secondary | ICD-10-CM

## 2016-06-02 DIAGNOSIS — N3946 Mixed incontinence: Secondary | ICD-10-CM

## 2016-06-02 LAB — BLADDER SCAN AMB NON-IMAGING

## 2016-06-02 MED ORDER — MIRABEGRON ER 25 MG PO TB24: 25.0000 mg | ORAL_TABLET | Freq: Every day | ORAL | 12 refills | Status: DC

## 2016-06-04 NOTE — Progress Notes (Signed)
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  INTERVAL HISTORY: Patient returns to clinic today for further evaluation and consideration of cycle 4 of 4 of neoadjuvant Taxotere and Cytoxan. She currently feels well and is asymptomatic. She has no neurologic complaints. She denies any recent fevers, chills, or illnesses. She has no chest pain or shortness of breath. She denies any nausea, vomiting, constipation, or diarrhea. She has no further urinary complaints. Patient offers no specific complaints today.  REVIEW OF SYSTEMS:   Review of Systems  Constitutional: Negative.  Negative for fever, malaise/fatigue and weight loss.  HENT: Negative for sinus pain.   Respiratory: Negative.  Negative for cough and shortness of breath.   Cardiovascular: Negative.  Negative for chest pain and leg swelling.  Gastrointestinal: Negative.  Negative for abdominal pain and constipation.  Genitourinary: Negative for hematuria and urgency.  Musculoskeletal: Negative.   Skin: Negative for itching and rash.  Neurological: Negative.  Negative for weakness and headaches.  Psychiatric/Behavioral: The patient is nervous/anxious and has insomnia.     As per HPI. Otherwise, a complete review of systems is negative.  PAST MEDICAL HISTORY: Past Medical History:  Diagnosis Date  . Arthritis   . Cancer ([place]) 03/13/2016   INVASIVE MAMMARY CARCINOMA  . Chickenpox   . Complication of anesthesia   . Frequent UTI   . GERD (gastroesophageal reflux disease)    RARE  . Hypertension   . PONV (postoperative  nausea and vomiting)   . RSD (reflex sympathetic dystrophy)   . Seasonal allergies   . Skin cancer   . Urine incontinence     PAST SURGICAL HISTORY: Past Surgical History:  Procedure Laterality Date  . ABDOMINAL HYSTERECTOMY  2005   total/ Dr Davis Gourd  . BLADDER TUMOR EXCISION  2006  . BREAST BIOPSY Left 03/13/2016   INVASIVE MAMMARY CARCINOMA  . PORTACATH PLACEMENT N/A 04/01/2016   Procedure: INSERTION PORT-A-CATH;  Surgeon: [name], MD;  Location: [facility] ORS;  Service: General;  Laterality: N/A;  . SKIN CANCER EXCISION Right 2015   foot/ Dr [place] Crane  . TUBAL LIGATION    . TUMOR EXCISION Right    FOOT    FAMILY HISTORY: Family History  Problem Relation Age of Onset  . Emphysema Father   . Heart disease Mother   . Transient ischemic attack Mother   . Breast cancer Cousin     paternal    ADVANCED DIRECTIVES (Y/N):  N  HEALTH MAINTENANCE: Social History  Substance Use Topics  . Smoking status: Former Smoker    Packs/day: 1.00    Years: 20.00    Types: Cigarettes    Quit date: 04/14/1994  . Smokeless tobacco: Never Used  . Alcohol use No     Colonoscopy:  PAP:  Bone density:  Lipid panel:  Allergies  Allergen Reactions  . Bee Pollen Hives  . Mosquito (Diagnostic) Hives  . Other Other (See Comments)    Uncoded Allergy. Allergen: VICRYL  (90% glycolide and 10% L-lactide [polyglactin-- synthetic absorbable sterile surgical suture]), Other Reaction: Infection  . Gabapentin Swelling  . Norvasc [Amlodipine Besylate] Swelling    TO  FEET  . Lisinopril Cough    Current Outpatient Prescriptions  Medication Sig Dispense Refill  . acetaminophen (TYLENOL) 500 MG tablet Take 500 mg by mouth every 6 (six) hours as needed (for pain.).    Marland Kitchen hydrochlorothiazide (HYDRODIURIL) 25 MG tablet Take 1 tablet (25 mg total) by mouth daily. 90 tablet 1  . lidocaine-prilocaine (EMLA) cream Apply 1 application topically as needed. Apply cream to port 1-2 hours prior to  chemotherapy appointment. Cover with plastic wrap. 30 g 2  . losartan (COZAAR) 50 MG tablet Take 1 tablet (50 mg total) by mouth daily. 90 tablet 0  . mirabegron ER (MYRBETRIQ) 25 MG TB24 tablet Take 1 tablet (25 mg total) by mouth daily. 30 tablet 12  . nitrofurantoin, macrocrystal-monohydrate, (MACROBID) 100 MG capsule Take 1 capsule (100 mg total) by mouth 2 (two) times daily. 14 capsule 0  . ondansetron (ZOFRAN) 8 MG tablet Take 1 tablet (8 mg total) by mouth 2 (two) times daily as needed for refractory nausea / vomiting. Start on day 3 after chemo. 30 tablet 1  . prochlorperazine (COMPAZINE) 10 MG tablet Take 1 tablet (10 mg total) by mouth every 6 (six) hours as needed (Nausea or vomiting). 30 tablet 1   No current facility-administered medications for this visit.     OBJECTIVE: Vitals:   06/05/16 0930  BP: 140/80  Pulse: (!) 112  Temp: 98.7 F (37.1 C)     Body mass index is 24.86 kg/m.    ECOG FS:0 - Asymptomatic  General: Well-developed, well-nourished, no acute distress. Eyes: Pink conjunctiva, anicteric sclera. Breasts: Palpable left breast nodule. Exam deferred today. Lungs: Clear to auscultation bilaterally. Heart: Regular rate and rhythm. No rubs, murmurs, or gallops. Abdomen: Soft, nontender, nondistended. No organomegaly noted, normoactive bowel sounds. Musculoskeletal: No edema, cyanosis, or clubbing. Neuro: Alert, answering all questions appropriately. Cranial nerves grossly intact. Skin: Diffuse small red papules, approximately 1/2 cm diameter, on bilateral arms. Psych: Normal affect.   LAB RESULTS:  Lab Results  Component Value Date   NA 135 06/05/2016   K 3.2 (L) 06/05/2016   CL 104 06/05/2016   CO2 25 06/05/2016   GLUCOSE 136 (H) 06/05/2016   BUN 23 (H) 06/05/2016   CREATININE 1.09 (H) 06/05/2016   CALCIUM 8.9 06/05/2016   PROT 6.7 06/05/2016   ALBUMIN 3.8 06/05/2016   AST 26 06/05/2016   ALT 21 06/05/2016   ALKPHOS 57 06/05/2016   BILITOT 0.7  06/05/2016   GFRNONAA 49 (L) 06/05/2016   GFRAA 57 (L) 06/05/2016    Lab Results  Component Value Date   WBC 5.7 06/05/2016   NEUTROABS 3.2 06/05/2016   HGB 10.5 (L) 06/05/2016   HCT 29.8 (L) 06/05/2016   MCV 92.2 06/05/2016   PLT 247 06/05/2016     STUDIES: No results found.  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  PLAN:    1. 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 FISH was negative for HER-2 therefore she does not require Herceptin. Also, given her EF of 41% cannot use Adriamycin. Proceed with cycle 4 of 4 of neoadjuvant Taxotere and Cytoxan. Patient has been referred back to surgery for consideration of lumpectomy or mastectomy. She will also require adjuvant XRT if she undergoes lumpectomy.  Given the ER/PR status of patient's tumor, she will benefit from an aromatase inhibitor at the conclusion of all her treatments. Return to clinic in 1-2 weeks post-operatively for further evaluation and additional treatment planning.  2. Hyponatremia: Resolved. 3. Constipation: Continue OTC treatments as needed.  Approximately 30 minutes was spent in discussion of which greater than 30 minutes was consultation.  Patient expressed understanding and was in agreement with this plan. She also understands that She can call clinic at any time with any questions, concerns, or complaints.   Cancer Staging Malignant neoplasm of upper-outer quadrant of left female breast [facility]) Staging form: Breast, AJCC 7th Edition - Clinical stage from 03/19/2016: Stage IIA (T2, N0, M0) - Signed by Lloyd Huger, MD on 04/06/2016    Lloyd Huger, MD 06/08/16 9:38 AM

## 2016-06-05 ENCOUNTER — Inpatient Hospital Stay: Payer: PPO

## 2016-06-05 ENCOUNTER — Encounter: Payer: Self-pay | Admitting: Oncology

## 2016-06-05 ENCOUNTER — Inpatient Hospital Stay (HOSPITAL_BASED_OUTPATIENT_CLINIC_OR_DEPARTMENT_OTHER): Payer: PPO | Admitting: Oncology

## 2016-06-05 VITALS — BP 140/80 | HR 112 | Temp 98.7°F | Wt 158.7 lb

## 2016-06-05 DIAGNOSIS — Z170 Estrogen receptor positive status [ER+]: Principal | ICD-10-CM

## 2016-06-05 DIAGNOSIS — R630 Anorexia: Secondary | ICD-10-CM

## 2016-06-05 DIAGNOSIS — I10 Essential (primary) hypertension: Secondary | ICD-10-CM | POA: Diagnosis not present

## 2016-06-05 DIAGNOSIS — M129 Arthropathy, unspecified: Secondary | ICD-10-CM

## 2016-06-05 DIAGNOSIS — R32 Unspecified urinary incontinence: Secondary | ICD-10-CM | POA: Diagnosis not present

## 2016-06-05 DIAGNOSIS — G9050 Complex regional pain syndrome I, unspecified: Secondary | ICD-10-CM

## 2016-06-05 DIAGNOSIS — G4700 Insomnia, unspecified: Secondary | ICD-10-CM

## 2016-06-05 DIAGNOSIS — Z87891 Personal history of nicotine dependence: Secondary | ICD-10-CM

## 2016-06-05 DIAGNOSIS — C50412 Malignant neoplasm of upper-outer quadrant of left female breast: Secondary | ICD-10-CM

## 2016-06-05 DIAGNOSIS — R300 Dysuria: Secondary | ICD-10-CM | POA: Diagnosis not present

## 2016-06-05 DIAGNOSIS — Z85828 Personal history of other malignant neoplasm of skin: Secondary | ICD-10-CM

## 2016-06-05 DIAGNOSIS — K5900 Constipation, unspecified: Secondary | ICD-10-CM | POA: Diagnosis not present

## 2016-06-05 DIAGNOSIS — Z79899 Other long term (current) drug therapy: Secondary | ICD-10-CM

## 2016-06-05 DIAGNOSIS — K219 Gastro-esophageal reflux disease without esophagitis: Secondary | ICD-10-CM

## 2016-06-05 DIAGNOSIS — Z7689 Persons encountering health services in other specified circumstances: Secondary | ICD-10-CM

## 2016-06-05 DIAGNOSIS — R634 Abnormal weight loss: Secondary | ICD-10-CM

## 2016-06-05 DIAGNOSIS — Z5111 Encounter for antineoplastic chemotherapy: Secondary | ICD-10-CM | POA: Diagnosis not present

## 2016-06-05 DIAGNOSIS — Z87440 Personal history of urinary (tract) infections: Secondary | ICD-10-CM

## 2016-06-05 LAB — COMPREHENSIVE METABOLIC PANEL
ALK PHOS: 57 U/L (ref 38–126)
ALT: 21 U/L (ref 14–54)
AST: 26 U/L (ref 15–41)
Albumin: 3.8 g/dL (ref 3.5–5.0)
Anion gap: 6 (ref 5–15)
BUN: 23 mg/dL — AB (ref 6–20)
CALCIUM: 8.9 mg/dL (ref 8.9–10.3)
CO2: 25 mmol/L (ref 22–32)
Chloride: 104 mmol/L (ref 101–111)
Creatinine, Ser: 1.09 mg/dL — ABNORMAL HIGH (ref 0.44–1.00)
GFR, EST AFRICAN AMERICAN: 57 mL/min — AB (ref >60)
GFR, EST NON AFRICAN AMERICAN: 49 mL/min — AB (ref >60)
Glucose, Bld: 136 mg/dL — ABNORMAL HIGH (ref 65–99)
Potassium: 3.2 mmol/L — ABNORMAL LOW (ref 3.5–5.1)
Sodium: 135 mmol/L (ref 135–145)
Total Bilirubin: 0.7 mg/dL (ref 0.3–1.2)
Total Protein: 6.7 g/dL (ref 6.5–8.1)

## 2016-06-05 LAB — CBC WITH DIFFERENTIAL/PLATELET
Basophils Absolute: 0 10*3/uL (ref 0–0.1)
Basophils Relative: 0 %
Eosinophils Absolute: 0 10*3/uL (ref 0–0.7)
Eosinophils Relative: 0 %
HCT: 29.8 % — ABNORMAL LOW (ref 35.0–47.0)
HEMOGLOBIN: 10.5 g/dL — AB (ref 12.0–16.0)
LYMPHS ABS: 1.8 10*3/uL (ref 1.0–3.6)
LYMPHS PCT: 33 %
MCH: 32.3 pg (ref 26.0–34.0)
MCHC: 35.1 g/dL (ref 32.0–36.0)
MCV: 92.2 fL (ref 80.0–100.0)
Monocytes Absolute: 0.5 10*3/uL (ref 0.2–0.9)
Monocytes Relative: 9 %
NEUTROS ABS: 3.2 10*3/uL (ref 1.4–6.5)
NEUTROS PCT: 58 %
Platelets: 247 10*3/uL (ref 150–440)
RBC: 3.24 MIL/uL — AB (ref 3.80–5.20)
RDW: 17 % — ABNORMAL HIGH (ref 11.5–14.5)
WBC: 5.7 10*3/uL (ref 3.6–11.0)

## 2016-06-05 MED ORDER — SODIUM CHLORIDE 0.9% FLUSH
10.0000 mL | Freq: Once | INTRAVENOUS | Status: AC
  Administered 2016-06-05: 10 mL via INTRAVENOUS
  Filled 2016-06-05: qty 10

## 2016-06-05 MED ORDER — SODIUM CHLORIDE 0.9 % IV SOLN
1100.0000 mg | Freq: Once | INTRAVENOUS | Status: AC
  Administered 2016-06-05: 1100 mg via INTRAVENOUS
  Filled 2016-06-05: qty 5

## 2016-06-05 MED ORDER — PEGFILGRASTIM 6 MG/0.6ML ~~LOC~~ PSKT
6.0000 mg | PREFILLED_SYRINGE | Freq: Once | SUBCUTANEOUS | Status: AC
  Administered 2016-06-05: 6 mg via SUBCUTANEOUS
  Filled 2016-06-05: qty 0.6

## 2016-06-05 MED ORDER — SODIUM CHLORIDE 0.9 % IV SOLN
Freq: Once | INTRAVENOUS | Status: AC
  Administered 2016-06-05: 10:00:00 via INTRAVENOUS
  Filled 2016-06-05: qty 1000

## 2016-06-05 MED ORDER — DOCETAXEL CHEMO INJECTION 160 MG/16ML: 75.0000 mg/m2 | Freq: Once | INTRAVENOUS | Status: DC

## 2016-06-05 MED ORDER — SODIUM CHLORIDE 0.9 % IV SOLN: 10.0000 mg | Freq: Once | INTRAVENOUS | Status: DC

## 2016-06-05 MED ORDER — HEPARIN SOD (PORK) LOCK FLUSH 100 UNIT/ML IV SOLN
500.0000 [IU] | Freq: Once | INTRAVENOUS | Status: AC
  Administered 2016-06-05: 500 [IU] via INTRAVENOUS
  Filled 2016-06-05: qty 5

## 2016-06-05 MED ORDER — PALONOSETRON HCL INJECTION 0.25 MG/5ML
0.2500 mg | Freq: Once | INTRAVENOUS | Status: AC
  Administered 2016-06-05: 0.25 mg via INTRAVENOUS
  Filled 2016-06-05: qty 5

## 2016-06-05 MED ORDER — DEXAMETHASONE SODIUM PHOSPHATE 10 MG/ML IJ SOLN
10.0000 mg | Freq: Once | INTRAMUSCULAR | Status: AC
  Administered 2016-06-05: 10 mg via INTRAVENOUS
  Filled 2016-06-05: qty 1

## 2016-06-05 MED ORDER — SODIUM CHLORIDE 0.9 % IV SOLN
75.0000 mg/m2 | Freq: Once | INTRAVENOUS | Status: AC
  Administered 2016-06-05: 140 mg via INTRAVENOUS
  Filled 2016-06-05: qty 14

## 2016-06-09 ENCOUNTER — Telehealth: Payer: Self-pay | Admitting: Urology

## 2016-06-09 ENCOUNTER — Ambulatory Visit (INDEPENDENT_AMBULATORY_CARE_PROVIDER_SITE_OTHER): Payer: PPO | Admitting: Urology

## 2016-06-09 ENCOUNTER — Encounter: Payer: Self-pay | Admitting: Urology

## 2016-06-09 ENCOUNTER — Other Ambulatory Visit: Payer: Self-pay | Admitting: Urology

## 2016-06-09 VITALS — BP 141/76 | Ht 67.0 in | Wt 158.0 lb

## 2016-06-09 DIAGNOSIS — N359 Urethral stricture, unspecified: Secondary | ICD-10-CM

## 2016-06-09 DIAGNOSIS — N3946 Mixed incontinence: Secondary | ICD-10-CM | POA: Diagnosis not present

## 2016-06-09 DIAGNOSIS — IMO0002 Reserved for concepts with insufficient information to code with codable children: Secondary | ICD-10-CM

## 2016-06-09 DIAGNOSIS — R300 Dysuria: Secondary | ICD-10-CM | POA: Diagnosis not present

## 2016-06-09 MED ORDER — OXYBUTYNIN CHLORIDE 5 MG PO TABS: 5.0000 mg | ORAL_TABLET | Freq: Three times a day (TID) | ORAL | 0 refills | Status: DC

## 2016-06-09 MED ORDER — OXYBUTYNIN CHLORIDE 5 MG PO TABS: 5.0000 mg | ORAL_TABLET | Freq: Three times a day (TID) | ORAL | Status: DC

## 2016-06-09 MED ORDER — TRIAMCINOLONE ACETONIDE 0.025 % EX OINT: 1.0000 "application " | TOPICAL_OINTMENT | Freq: Two times a day (BID) | CUTANEOUS | 0 refills | Status: DC

## 2016-06-09 NOTE — Telephone Encounter (Signed)
Spoke with pt in reference to possible UTI. Made pt aware will need a nurse visit for cath specimen. Pt voiced understanding. Pt was added to nurse schedule for today.

## 2016-06-09 NOTE — Progress Notes (Signed)
Patient present today for a cath urine, patient is complaining of urinary frequency and dysuria with urination. She also states she has chills but this could be due to her chemo.   Bladder Scan Patient can void: 50 ml Performed By: Fonnie Jarvis, CMA  In and Out Catheterization  Patient is present today for a I & O catheterization due to possible UTI. Patient was cleaned and prepped in a sterile fashion with betadine and Lidocaine 2% jelly was instilled into the urethra.  A 14FR cath was inserted complications were noted as: unable to advance catheter past urethral meatus. Zara Council, PAC was asked to assist and a 8 FR catheter was attempted with out sucess.  Preformed by: Fonnie Jarvis, CMA

## 2016-06-09 NOTE — Telephone Encounter (Signed)
Pt called and thinks she has a UTI.  She said Larene Beach told her to call if she started burning again.  Pt would like something called in for her.  Pt's number is [phone].

## 2016-06-09 NOTE — Progress Notes (Signed)
06/09/2016 7:02 PM   Catherine Tate 09/19/1940 [id number]  Referring provider: [name], NP [place], [place] [address]  No chief complaint on file.   HPI: 76 yo WF who presents today complaining of frequency, dysuria and chills for one to two days.  She is undergoing chemotherapy for breast cancer and received her last treatment 06/05/2016.    We attempted to straight cath the patient, but we were unable to advance the straight cath past the meatus.  Attempted to dilate in the office, but we were unsuccessful.  Patient's PVR was 50 mL.  She was able to provide a specimen for culture, but there was not enough volume for UA.    She also states her insurance will not cover her Myrbetriq and is requesting a different medications.  Her PVR was 0 mL.     PMH: Past Medical History:  Diagnosis Date  . Arthritis   . Cancer ([place]) 03/13/2016   INVASIVE MAMMARY CARCINOMA  . Chickenpox   . Complication of anesthesia   . Frequent UTI   . GERD (gastroesophageal reflux disease)    RARE  . Hypertension   . PONV (postoperative nausea and vomiting)   . RSD (reflex sympathetic dystrophy)   . Seasonal allergies   . Skin cancer   . Urine incontinence     Surgical History: Past Surgical History:  Procedure Laterality Date  . ABDOMINAL HYSTERECTOMY  2005   total/ Dr Davis Gourd  . BLADDER TUMOR EXCISION  2006  . BREAST BIOPSY Left 03/13/2016   INVASIVE MAMMARY CARCINOMA  . PORTACATH PLACEMENT N/A 04/01/2016   Procedure: INSERTION PORT-A-CATH;  Surgeon: [name], MD;  Location: [facility] ORS;  Service: General;  Laterality: N/A;  . SKIN CANCER EXCISION Right 2015   foot/ Dr Nevada Crane  . TUBAL LIGATION    . TUMOR EXCISION Right    FOOT    Home Medications:  Allergies as of 06/09/2016      Reactions   Bee Pollen Hives   Mosquito (diagnostic) Hives   Other Other (See Comments)   Uncoded Allergy. Allergen: VICRYL  (90% glycolide and 10% L-lactide  [polyglactin-- synthetic absorbable sterile surgical suture]), Other Reaction: Infection   Gabapentin Swelling   Norvasc [amlodipine Besylate] Swelling   TO FEET   Lisinopril Cough      Medication List       Accurate as of 06/09/16 11:59 PM. Always use your most recent med list.          acetaminophen 500 MG tablet Commonly known as:  TYLENOL Take 500 mg by mouth every 6 (six) hours as needed (for pain.).   hydrochlorothiazide 25 MG tablet Commonly known as:  HYDRODIURIL Take 1 tablet (25 mg total) by mouth daily.   lidocaine-prilocaine cream Commonly known as:  EMLA Apply 1 application topically as needed. Apply cream to port 1-2 hours prior to chemotherapy appointment. Cover with plastic wrap.   losartan 50 MG tablet Commonly known as:  COZAAR Take 1 tablet (50 mg total) by mouth daily.   mirabegron ER 25 MG Tb24 tablet Commonly known as:  MYRBETRIQ Take 1 tablet (25 mg total) by mouth daily.   nitrofurantoin (macrocrystal-monohydrate) 100 MG capsule Commonly known as:  MACROBID Take 1 capsule (100 mg total) by mouth 2 (two) times daily.   ondansetron 8 MG tablet Commonly known as:  ZOFRAN Take 1 tablet (8 mg total) by mouth 2 (two) times daily as needed for refractory nausea /  vomiting. Start on day 3 after chemo.   oxybutynin 5 MG tablet Commonly known as:  DITROPAN Take 1 tablet (5 mg total) by mouth 3 (three) times daily.   prochlorperazine 10 MG tablet Commonly known as:  COMPAZINE Take 1 tablet (10 mg total) by mouth every 6 (six) hours as needed (Nausea or vomiting).   triamcinolone 0.025 % ointment Commonly known as:  KENALOG Apply 1 application topically 2 (two) times daily.       Allergies:  Allergies  Allergen Reactions  . Bee Pollen Hives  . Mosquito (Diagnostic) Hives  . Other Other (See Comments)    Uncoded Allergy. Allergen: VICRYL  (90% glycolide and 10% L-lactide [polyglactin-- synthetic absorbable sterile surgical suture]), Other  Reaction: Infection  . Gabapentin Swelling  . Norvasc [Amlodipine Besylate] Swelling    TO FEET  . Lisinopril Cough    Family History: Family History  Problem Relation Age of Onset  . Emphysema Father   . Heart disease Mother   . Transient ischemic attack Mother   . Breast cancer Cousin     paternal    Social History:  reports that she quit smoking about 22 years ago. Her smoking use included Cigarettes. She has a 20.00 pack-year smoking history. She has never used smokeless tobacco. She reports that she does not drink alcohol or use drugs.  ROS: UROLOGY Frequent Urination?: Yes Hard to postpone urination?: No Burning/pain with urination?: Yes Get up at night to urinate?: No Leakage of urine?: No Urine stream starts and stops?: No Trouble starting stream?: No Do you have to strain to urinate?: No Blood in urine?: No Urinary tract infection?: Yes Sexually transmitted disease?: No Injury to kidneys or bladder?: No Painful intercourse?: No Weak stream?: No Currently pregnant?: No Vaginal bleeding?: No Last menstrual period?: n  Gastrointestinal Nausea?: No Vomiting?: No Indigestion/heartburn?: No Diarrhea?: No Constipation?: No  Constitutional Fever: No Night sweats?: No Weight loss?: No Fatigue?: No  Skin Skin rash/lesions?: No Itching?: No  Eyes Blurred vision?: No Double vision?: No  Ears/Nose/Throat Sore throat?: No Sinus problems?: No  Hematologic/Lymphatic Swollen glands?: No Easy bruising?: No  Cardiovascular Leg swelling?: No Chest pain?: No  Respiratory Cough?: No Shortness of breath?: No  Endocrine Excessive thirst?: No  Musculoskeletal Back pain?: No Joint pain?: No  Neurological Headaches?: No Dizziness?: No  Psychologic Depression?: No Anxiety?: No  Physical Exam: BP (!) 141/76   Ht 5\' 7"  (1.702 m)   Wt 158 lb (71.7 kg)   BMI 24.75 kg/m   Constitutional: Well nourished. Alert and oriented, No acute  distress. HEENT: [place] AT, moist mucus membranes. Trachea midline, no masses. Cardiovascular: No clubbing, cyanosis, or edema. Respiratory: Normal respiratory effort, no increased work of breathing. GI: Abdomen is soft, non tender, non distended, no abdominal masses. Liver and spleen not palpable.  No hernias appreciated.  Stool sample for occult testing is not indicated.   GU: No CVA tenderness.  No bladder fullness or masses.  Atrophic external genitalia, normal pubic hair distribution, no lesions.  Normal urethral meatus, no lesions, no prolapse, no discharge.   No urethral masses, tenderness and/or tenderness. No bladder fullness, tenderness or masses. Pale vagina mucosa, poor estrogen effect, no discharge and no lesions.  Skin: No rashes, bruises or suspicious lesions. Lymph: No cervical or inguinal adenopathy. Neurologic: Grossly intact, no focal deficits, moving all 4 extremities. Psychiatric: Normal mood and affect.  Laboratory Data: Lab Results  Component Value Date   WBC 5.7 06/05/2016   HGB  10.5 (L) 06/05/2016   HCT 29.8 (L) 06/05/2016   MCV 92.2 06/05/2016   PLT 247 06/05/2016    Lab Results  Component Value Date   CREATININE 1.09 (H) 06/05/2016     Lab Results  Component Value Date   HGBA1C 5.7 10/29/2015       Component Value Date/Time   CHOL 221 (H) 10/29/2015 0750   HDL 44.10 10/29/2015 0750   CHOLHDL 5 10/29/2015 0750   VLDL 24.4 10/29/2015 0750   LDLCALC 153 (H) 10/29/2015 0750    Lab Results  Component Value Date   AST 26 06/05/2016   Lab Results  Component Value Date   ALT 21 06/05/2016    Pertinent Imaging: Results for JANARIAH, MARA (MRN [id number]) as of 06/21/2016 18:54  Ref. Range 06/02/2016 08:42  Scan Result Unknown 76ml    Assessment & Plan:    1. Dysuria  - CULTURE, URINE COMPREHENSIVE  2. Mixed incontinence  - script sent in for oxybutynin 5 mg tid  - keep appointment on 08/28/2016  3. Urethral stenosis  - patient is not a  candidate for vaginal estrogen therapy at this time due to her breast cancer  - she may use olive and/or coconut oil  - she is to contact the office if she cannot urinate or seek treatment in the ED     Return for keep appointment on 08/28/2016.  These notes generated with voice recognition software. I apologize for typographical errors.  Zara Council, [facility] [address] [place], [place] [address] [phone]

## 2016-06-14 ENCOUNTER — Other Ambulatory Visit: Payer: Self-pay | Admitting: Urology

## 2016-06-14 MED ORDER — AMOXICILLIN-POT CLAVULANATE 875-125 MG PO TABS: 1.0000 | ORAL_TABLET | Freq: Two times a day (BID) | ORAL | 0 refills | Status: DC

## 2016-06-15 LAB — CULTURE, URINE COMPREHENSIVE

## 2016-06-16 ENCOUNTER — Telehealth: Payer: Self-pay

## 2016-06-16 NOTE — Telephone Encounter (Signed)
-----   Message from Nori Riis, PA-C sent at 06/14/2016  8:00 PM EST ----- Patient has a +UCx. They need to start Augmentin 875/125, one tablet twice daily for seven days.  They also need to take a probiotic with the antibiotic course.  I have e scribed their prescription to [facility].

## 2016-06-16 NOTE — Telephone Encounter (Signed)
Spoke with pt in reference to +ucx. Made aware abx were sent to pharmacy. Pt voiced understanding.  

## 2016-06-17 ENCOUNTER — Encounter: Payer: Self-pay | Admitting: General Surgery

## 2016-06-17 ENCOUNTER — Ambulatory Visit (INDEPENDENT_AMBULATORY_CARE_PROVIDER_SITE_OTHER): Payer: PPO | Admitting: General Surgery

## 2016-06-17 ENCOUNTER — Inpatient Hospital Stay: Payer: Self-pay

## 2016-06-17 VITALS — BP 118/68 | HR 119 | Resp 14 | Ht 68.0 in | Wt 159.0 lb

## 2016-06-17 DIAGNOSIS — Z170 Estrogen receptor positive status [ER+]: Principal | ICD-10-CM

## 2016-06-17 DIAGNOSIS — C50412 Malignant neoplasm of upper-outer quadrant of left female breast: Secondary | ICD-10-CM

## 2016-06-17 NOTE — Progress Notes (Signed)
Patient ID: Catherine Tate, female   DOB: [birth year], 76 y.o.   MRN: [id number]  Chief Complaint  Patient presents with  . Follow-up    discuss surgery    HPI Catherine Tate is a 76 y.o. female.  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 She reports tolerating her treatments fairly well.    HPI  Past Medical History:  Diagnosis Date  . Arthritis   . Cancer ([place]) 03/13/2016   INVASIVE MAMMARY CARCINOMA  . Chickenpox   . Complication of anesthesia   . Frequent UTI   . GERD (gastroesophageal reflux disease)    RARE  . Hypertension   . PONV (postoperative nausea and vomiting)   . RSD (reflex sympathetic dystrophy)   . Seasonal allergies   . Skin cancer   . Urine incontinence     Past Surgical History:  Procedure Laterality Date  . ABDOMINAL HYSTERECTOMY  2005   total/ Dr Davis Gourd  . BLADDER TUMOR EXCISION  2006  . BREAST BIOPSY Left 03/13/2016   INVASIVE MAMMARY CARCINOMA  . PORTACATH PLACEMENT N/A 04/01/2016   Procedure: INSERTION PORT-A-CATH;  Surgeon: [name], MD;  Location: [facility] ORS;  Service: General;  Laterality: N/A;  . SKIN CANCER EXCISION Right 2015   foot/ Dr [place] Crane  . TUBAL LIGATION    . TUMOR EXCISION Right    FOOT    Family History  Problem Relation Age of Onset  . Emphysema Father   . Heart disease Mother   . Transient ischemic attack Mother   . Breast cancer Cousin     paternal    Social History Social History  Substance Use Topics  . Smoking status: Former Smoker    Packs/day: 1.00    Years: 20.00    Types: Cigarettes    Quit date: 04/14/1994  . Smokeless tobacco: Never Used  . Alcohol use No    Allergies  Allergen Reactions  . Bee Pollen Hives  . Mosquito (Diagnostic) Hives  . Other Other (See Comments)    Uncoded Allergy. Allergen: VICRYL  (90% glycolide and 10% L-lactide [polyglactin-- synthetic absorbable sterile surgical  suture]), Other Reaction: Infection  . Gabapentin Swelling  . Norvasc [Amlodipine Besylate] Swelling    TO FEET  . Lisinopril Cough    Current Outpatient Prescriptions  Medication Sig Dispense Refill  . acetaminophen (TYLENOL) 500 MG tablet Take 500 mg by mouth every 6 (six) hours as needed (for pain.).    Marland Kitchen amoxicillin-clavulanate (AUGMENTIN) 875-125 MG tablet Take 1 tablet by mouth every 12 (twelve) hours. 14 tablet 0  . hydrochlorothiazide (HYDRODIURIL) 25 MG tablet Take 1 tablet (25 mg total) by mouth daily. 90 tablet 1  . lidocaine-prilocaine (EMLA) cream Apply 1 application topically as needed. Apply cream to port 1-2 hours prior to chemotherapy appointment. Cover with plastic wrap. 30 g 2  . losartan (COZAAR) 50 MG tablet Take 1 tablet (50 mg total) by mouth daily. 90 tablet 0  . mirabegron ER (MYRBETRIQ) 25 MG TB24 tablet Take 1 tablet (25 mg total) by mouth daily. 30 tablet 12  . nitrofurantoin, macrocrystal-monohydrate, (MACROBID) 100 MG capsule Take 1 capsule (100 mg total) by mouth 2 (two) times daily. 14 capsule 0  . ondansetron (ZOFRAN) 8 MG tablet Take 1 tablet (8 mg total) by mouth 2 (two) times daily as needed for refractory nausea / vomiting. Start on day 3 after chemo. 30 tablet 1  .  oxybutynin (DITROPAN) 5 MG tablet Take 1 tablet (5 mg total) by mouth 3 (three) times daily. 90 tablet 0  . prochlorperazine (COMPAZINE) 10 MG tablet Take 1 tablet (10 mg total) by mouth every 6 (six) hours as needed (Nausea or vomiting). 30 tablet 1  . triamcinolone (KENALOG) 0.025 % ointment Apply 1 application topically 2 (two) times daily. 30 g 0   No current facility-administered medications for this visit.     Review of Systems Review of Systems  Constitutional: Negative.   Respiratory: Negative.   Cardiovascular: Negative.     Blood pressure 118/68, pulse (!) 119, resp. rate 14, height '5\' 8"'  (1.727 m), weight 159 lb (72.1 kg).  Physical Exam Physical Exam  Constitutional: She  is oriented to person, place, and time. She appears well-developed and well-nourished.  Eyes: Conjunctivae are normal. No scleral icterus.  Neck: Neck supple.  Cardiovascular: Normal rate, regular rhythm and normal heart sounds.   Pulmonary/Chest: Effort normal and breath sounds normal.  Left breast thickening in the upper outer quadrant.    Lymphadenopathy:    She has no cervical adenopathy.  Neurological: She is alert and oriented to person, place, and time.  Skin: Skin is warm and dry.    Data Reviewed 03/13/2016 ultrasound described a 1.7 x 2.4 x 1.7 cm mass in the upper-outer quadrant of the left breast. Also identified was a small lesion at the 3:00 position, aspirated without evidence of malignancy on FNA sampling) and a right contralateral cyst with a benignmicrocalcifications.  Core biopsy of the left upper outer quadrant breast mass showed an ER 90%, PR 90%, HER-2/neu negative tumor.  Repeat ultrasound today to assess response to her neoadjuvant treatment was completed. In the 1:00 position of the left breast 8 cm from the nipple a multilobulated mass with acoustic shadowing measured 1.7 x 1.9 x 2.26 cm. Essentially no change with neoadjuvant treatment. A single visualized lymph node in the lower aspect the axilla measured 0.7 cm and had a similar cortical appearance to that noted at the time of her November 2017 exam. The previously placed biopsy clip is evident just adjacent to the main mass, slightly medial to it at the 7 cm mark. BI-RADS-6.   06/09/2016 urine culture showed 50,000 colony forming units of Escherichia coli and Pseudomonas. Both sensitive to. Piperacillin. .  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    Plan    We reviewed options for management. The patient would like to proceed with breast conservation, but she herself brought up the left mastectomy was required for adequate margins this was be  acceptable.  The indication for sentinel node biopsy was reviewed.  Role of postoperative radiation therapy briefly touched upon.   The patient is scheduled for surgery at [facility] on 07/14/16. She will arrive at the Radiology desk that day at 7:45 am. She will pre admit by phone. The patient is aware of date and instructions.   This information has been scribed by Karie Fetch RN, BSN,BC.   [name] 06/18/2016, 1:44 PM

## 2016-06-17 NOTE — Patient Instructions (Addendum)
The patient is aware to call back for any questions or concerns.  The patient is scheduled for surgery at [facility] on 07/14/16. She will arrive at the Radiology desk that day at 7:45 am. She will pre admit by phone. The patient is aware of date and instructions.

## 2016-06-20 ENCOUNTER — Telehealth: Payer: Self-pay | Admitting: *Deleted

## 2016-06-20 NOTE — Telephone Encounter (Signed)
Called to report that her surgery is going to be April 2,2018

## 2016-06-23 NOTE — Telephone Encounter (Signed)
Pt will follow up 1 week after surgery with [facility]. Message sent to scheduling to schedule appt and notify pt. Thanks.

## 2016-06-24 ENCOUNTER — Telehealth: Payer: Self-pay | Admitting: *Deleted

## 2016-06-24 NOTE — Telephone Encounter (Signed)
Patient called because she has surgery on 07/14/16 and she received a call from the pre service center and pre admit but not her pharmacy. She stated that the instructions we gave her said that she would receive a call from her pharmacy. I talked with Caryl-Lyn and she said it is no big deal if she doesn't recieve a call from them that they will just go over medications.

## 2016-07-07 ENCOUNTER — Encounter
Admission: RE | Admit: 2016-07-07 | Discharge: 2016-07-07 | Disposition: A | Discharge status: Home / Self Care | Payer: PPO | Source: Ambulatory Visit | Attending: General Surgery | Admitting: General Surgery

## 2016-07-07 DIAGNOSIS — Z01818 Encounter for other preprocedural examination: Secondary | ICD-10-CM | POA: Diagnosis not present

## 2016-07-07 DIAGNOSIS — Z170 Estrogen receptor positive status [ER+]: Secondary | ICD-10-CM | POA: Diagnosis not present

## 2016-07-07 DIAGNOSIS — C50412 Malignant neoplasm of upper-outer quadrant of left female breast: Secondary | ICD-10-CM | POA: Insufficient documentation

## 2016-07-07 LAB — HEMOGLOBIN: Hemoglobin: 10.7 g/dL — ABNORMAL LOW (ref 12.0–16.0)

## 2016-07-07 LAB — POTASSIUM: Potassium: 4.4 mmol/L (ref 3.5–5.1)

## 2016-07-07 NOTE — Patient Instructions (Signed)
  Your procedure is scheduled on: 07-14-16 (Monday) Report to [place] (2ND DESK ON RIGHT) @ 7:45 AM  Remember: Instructions that are not followed completely may result in serious medical risk, up to and including death, or upon the discretion of your surgeon and anesthesiologist your surgery may need to be rescheduled.    _x___ 1. Do not eat food or drink liquids after midnight. No gum chewing or  hard candies.     __x__ 2. No Alcohol for 24 hours before or after surgery.   __x__3. No Smoking for 24 prior to surgery.   ____  4. Bring all medications with you on the day of surgery if instructed.    __x__ 5. Notify your doctor if there is any change in your medical condition     (cold, fever, infections).     Do not wear jewelry, make-up, hairpins, clips or nail polish.  Do not wear lotions, powders, or perfumes. You may wear deodorant.  Do not shave 48 hours prior to surgery. Men may shave face and neck.  Do not bring valuables to the hospital.    [facility] is not responsible for any belongings or valuables.               Contacts, dentures or bridgework may not be worn into surgery.  Leave your suitcase in the car. After surgery it may be brought to your room.  For patients admitted to the hospital, discharge time is determined by your treatment team.   Patients discharged the day of surgery will not be allowed to drive home.  You will need someone to drive you home and stay with you the night of your procedure.    Please read over the following fact sheets that you were given:     _x___ Take anti-hypertensive (unless it includes a diuretic), cardiac, seizure, asthma, anti-reflux and psychiatric medicines. These include:  1. LOSARTAN  2.  3.  4.  5.  6.  ____Fleets enema or Magnesium Citrate as directed.   _x___ Use CHG Soap or sage wipes as directed on instruction sheet   ____ Use inhalers on the day of surgery and bring to hospital day of  surgery  ____ Stop Metformin and Janumet 2 days prior to surgery.    ____ Take 1/2 of usual insulin dose the night before surgery and none on the morning     surgery.   ____ Follow recommendations from Cardiologist, Pulmonologist or PCP regarding stopping Aspirin, Coumadin, Pllavix ,Eliquis, Effient, or Pradaxa, and Pletal.  ____Stop Anti-inflammatories such as Advil, Aleve, Ibuprofen, Motrin, Naproxen, Naprosyn, Goodies powders or aspirin products. OK to take Tyleno   ____ Stop supplements until after surgery.     ____ Bring C-Pap to the hospital.

## 2016-07-09 ENCOUNTER — Ambulatory Visit (INDEPENDENT_AMBULATORY_CARE_PROVIDER_SITE_OTHER): Payer: PPO | Admitting: Family Medicine

## 2016-07-09 VITALS — BP 142/70 | HR 117 | Temp 99.4°F | Resp 18 | Wt 161.0 lb

## 2016-07-09 DIAGNOSIS — J069 Acute upper respiratory infection, unspecified: Secondary | ICD-10-CM

## 2016-07-09 MED ORDER — BENZONATATE 100 MG PO CAPS: 100.0000 mg | ORAL_CAPSULE | Freq: Two times a day (BID) | ORAL | 0 refills | Status: DC | PRN

## 2016-07-09 MED ORDER — AMOXICILLIN 875 MG PO TABS: 875.0000 mg | ORAL_TABLET | Freq: Two times a day (BID) | ORAL | 0 refills | Status: DC

## 2016-07-09 NOTE — Progress Notes (Signed)
Pre visit review using our clinic review tool, if applicable. No additional management support is needed unless otherwise documented below in the visit note. 

## 2016-07-09 NOTE — Progress Notes (Signed)
Subjective:    Patient ID: Catherine Tate, female    DOB: [birth year], 76 y.o.   MRN: [id number]  HPI This is a 76 yo female who presents today with runny nose and sore throat for several days, nasal drainage watery, white. Cough started yesterday, dry. Gargled with salt water last night, some improvement. Occasional SOB, no wheezing. Took acetaminophen and Claritin with improvement. Subjective fever. No aches.  Has surgery scheduled for Monday for either lumpectomy/mastectomy. Was instructed to be seen and get antibiotic. Has completed chemo and will need radiation after surgery.   Past Medical History:  Diagnosis Date  . Arthritis   . Cancer ([place]) 03/13/2016   INVASIVE MAMMARY CARCINOMA  . Chickenpox   . Complication of anesthesia   . Frequent UTI   . GERD (gastroesophageal reflux disease)    RARE  . Hypertension   . PONV (postoperative nausea and vomiting)   . RSD (reflex sympathetic dystrophy)   . Seasonal allergies   . Skin cancer   . Urine incontinence    Past Surgical History:  Procedure Laterality Date  . ABDOMINAL HYSTERECTOMY  2005   total/ Dr Davis Gourd  . BLADDER TUMOR EXCISION  2006  . BREAST BIOPSY Left 03/13/2016   INVASIVE MAMMARY CARCINOMA  . PORTACATH PLACEMENT N/A 04/01/2016   Procedure: INSERTION PORT-A-CATH;  Surgeon: [name], MD;  Location: [facility] ORS;  Service: General;  Laterality: N/A;  . SKIN CANCER EXCISION Right 2015   foot/ Dr [place] Crane  . TUBAL LIGATION    . TUMOR EXCISION Right    FOOT   Family History  Problem Relation Age of Onset  . Emphysema Father   . Heart disease Mother   . Transient ischemic attack Mother   . Breast cancer Cousin     paternal   Social History  Substance Use Topics  . Smoking status: Former Smoker    Packs/day: 1.00    Years: 20.00    Types: Cigarettes    Quit date: 04/14/1994  . Smokeless tobacco: Never Used  . Alcohol use No      Review of Systems Per HPI    Objective:   Physical Exam    Constitutional: She is oriented to person, place, and time. She appears well-developed and well-nourished. No distress.  HENT:  Head: Normocephalic and atraumatic.  Right Ear: Tympanic membrane, external ear and ear canal normal.  Left Ear: Tympanic membrane, external ear and ear canal normal.  Nose: Mucosal edema and rhinorrhea present.  Mouth/Throat: Uvula is midline and mucous membranes are normal. She has dentures. Posterior oropharyngeal erythema present. No oropharyngeal exudate or posterior oropharyngeal edema.  Post nasal drainage.   Cardiovascular: Normal rate, regular rhythm and normal heart sounds.   HR recheck 88.   Pulmonary/Chest: Effort normal and breath sounds normal.  Neurological: She is alert and oriented to person, place, and time.  Skin: Skin is warm and dry. She is not diaphoretic.  Psychiatric: She has a normal mood and affect. Her behavior is normal. Judgment and thought content normal.  Vitals reviewed.     BP (!) 142/70 (BP Location: Right Arm, Patient Position: Sitting, Cuff Size: Normal)   Pulse (!) 117   Temp 99.4 F (37.4 C) (Oral)   Resp 18   Wt 161 lb (73 kg)   SpO2 95%   BMI 24.48 kg/m  Wt Readings from Last 3 Encounters:  07/09/16 161 lb (73 kg)  06/17/16 159 lb (72.1 kg)  06/09/16 158 lb (71.7 kg)  BP Readings from Last 3 Encounters:  07/09/16 (!) 142/70  07/07/16 136/70  06/17/16 118/68       Assessment & Plan:  1. Upper respiratory tract infection, unspecified type - will cover for bacterial infection since she is scheduled for surgery 07/14/16 - Provided written and verbal information regarding diagnosis and treatment. - RTC/ER precautions reviewed - benzonatate (TESSALON) 100 MG capsule; Take 1 capsule (100 mg total) by mouth 2 (two) times daily as needed for cough.  Dispense: 20 capsule; Refill: 0 - amoxicillin (AMOXIL) 875 MG tablet; Take 1 tablet (875 mg total) by mouth 2 (two) times daily.  Dispense: 14 tablet; Refill:  0   Clarene Reamer, FNP-BC  [facility]  07/10/2016 9:10 PM

## 2016-07-09 NOTE — Patient Instructions (Signed)
I have sent an antibiotic and cough tablet to your pharmacy Please take your temperature daily Continue tylenol, warm salt water gargles and Claritin as needed Call the office if you get worse Call 911 if you develop any difficulty breathing   Upper Respiratory Infection, Adult Most upper respiratory infections (URIs) are a viral infection of the air passages leading to the lungs. A URI affects the nose, throat, and upper air passages. The most common type of URI is nasopharyngitis and is typically referred to as "the common cold." URIs run their course and usually go away on their own. Most of the time, a URI does not require medical attention, but sometimes a bacterial infection in the upper airways can follow a viral infection. This is called a secondary infection. Sinus and middle ear infections are common types of secondary upper respiratory infections. Bacterial pneumonia can also complicate a URI. A URI can worsen asthma and chronic obstructive pulmonary disease (COPD). Sometimes, these complications can require emergency medical care and may be life threatening. What are the causes? Almost all URIs are caused by viruses. A virus is a type of germ and can spread from one person to another. What increases the risk? You may be at risk for a URI if:  You smoke.  You have chronic heart or lung disease.  You have a weakened defense (immune) system.  You are very young or very old.  You have nasal allergies or asthma.  You work in crowded or poorly ventilated areas.  You work in health care facilities or schools. What are the signs or symptoms? Symptoms typically develop 2-3 days after you come in contact with a cold virus. Most viral URIs last 7-10 days. However, viral URIs from the influenza virus (flu virus) can last 14-18 days and are typically more severe. Symptoms may include:  Runny or stuffy (congested) nose.  Sneezing.  Cough.  Sore  throat.  Headache.  Fatigue.  Fever.  Loss of appetite.  Pain in your forehead, behind your eyes, and over your cheekbones (sinus pain).  Muscle aches. How is this diagnosed? Your health care provider may diagnose a URI by:  Physical exam.  Tests to check that your symptoms are not due to another condition such as:  Strep throat.  Sinusitis.  Pneumonia.  Asthma. How is this treated? A URI goes away on its own with time. It cannot be cured with medicines, but medicines may be prescribed or recommended to relieve symptoms. Medicines may help:  Reduce your fever.  Reduce your cough.  Relieve nasal congestion. Follow these instructions at home:  Take medicines only as directed by your health care provider.  Gargle warm saltwater or take cough drops to comfort your throat as directed by your health care provider.  Use a warm mist humidifier or inhale steam from a shower to increase air moisture. This may make it easier to breathe.  Drink enough fluid to keep your urine clear or pale yellow.  Eat soups and other clear broths and maintain good nutrition.  Rest as needed.  Return to work when your temperature has returned to normal or as your health care provider advises. You may need to stay home longer to avoid infecting others. You can also use a face mask and careful hand washing to prevent spread of the virus.  Increase the usage of your inhaler if you have asthma.  Do not use any tobacco products, including cigarettes, chewing tobacco, or electronic cigarettes. If you need help  quitting, ask your health care provider. How is this prevented? The best way to protect yourself from getting a cold is to practice good hygiene.  Avoid oral or hand contact with people with cold symptoms.  Wash your hands often if contact occurs. There is no clear evidence that vitamin C, vitamin E, echinacea, or exercise reduces the chance of developing a cold. However, it is always  recommended to get plenty of rest, exercise, and practice good nutrition. Contact a health care provider if:  You are getting worse rather than better.  Your symptoms are not controlled by medicine.  You have chills.  You have worsening shortness of breath.  You have brown or red mucus.  You have yellow or brown nasal discharge.  You have pain in your face, especially when you bend forward.  You have a fever.  You have swollen neck glands.  You have pain while swallowing.  You have white areas in the back of your throat. Get help right away if:  You have severe or persistent:  Headache.  Ear pain.  Sinus pain.  Chest pain.  You have chronic lung disease and any of the following:  Wheezing.  Prolonged cough.  Coughing up blood.  A change in your usual mucus.  You have a stiff neck.  You have changes in your:  Vision.  Hearing.  Thinking.  Mood. This information is not intended to replace advice given to you by your health care provider. Make sure you discuss any questions you have with your health care provider. Document Released: 09/24/2000 Document Revised: 12/02/2015 Document Reviewed: 07/06/2013 Elsevier Interactive Patient Education  2017 [employer].

## 2016-07-10 ENCOUNTER — Encounter: Payer: Self-pay | Admitting: Family Medicine

## 2016-07-13 MED ORDER — FAMOTIDINE 20 MG PO TABS
20.0000 mg | ORAL_TABLET | Freq: Once | ORAL | Status: AC
  Administered 2016-07-14: 20 mg via ORAL

## 2016-07-13 MED ORDER — PIPERACILLIN-TAZOBACTAM 3.375 G IVPB 30 MIN
3.3750 g | Freq: Once | INTRAVENOUS | Status: DC
  Filled 2016-07-13: qty 50

## 2016-07-13 MED ORDER — DEXTROSE 5 % IV SOLN
3.3750 g | Freq: Once | INTRAVENOUS | Status: DC
  Filled 2016-07-13: qty 3.38

## 2016-07-14 ENCOUNTER — Ambulatory Visit
Admission: RE | Admit: 2016-07-14 | Discharge: 2016-07-14 | Disposition: A | Discharge status: Home / Self Care | Payer: PPO | Source: Ambulatory Visit | Attending: General Surgery | Admitting: General Surgery

## 2016-07-14 ENCOUNTER — Ambulatory Visit: Payer: PPO | Admitting: Certified Registered"

## 2016-07-14 ENCOUNTER — Encounter
Admission: RE | Admit: 2016-07-14 | Discharge: 2016-07-14 | Disposition: A | Discharge status: Home / Self Care | Payer: PPO | Source: Ambulatory Visit | Attending: General Surgery | Admitting: General Surgery

## 2016-07-14 ENCOUNTER — Encounter
Admission: RE | Disposition: A | Discharge status: Home / Self Care | Payer: Self-pay | Source: Ambulatory Visit | Attending: General Surgery

## 2016-07-14 ENCOUNTER — Ambulatory Visit: Payer: PPO

## 2016-07-14 ENCOUNTER — Encounter: Payer: Self-pay | Admitting: *Deleted

## 2016-07-14 DIAGNOSIS — M1990 Unspecified osteoarthritis, unspecified site: Secondary | ICD-10-CM | POA: Insufficient documentation

## 2016-07-14 DIAGNOSIS — J449 Chronic obstructive pulmonary disease, unspecified: Secondary | ICD-10-CM | POA: Insufficient documentation

## 2016-07-14 DIAGNOSIS — Z85828 Personal history of other malignant neoplasm of skin: Secondary | ICD-10-CM | POA: Insufficient documentation

## 2016-07-14 DIAGNOSIS — K219 Gastro-esophageal reflux disease without esophagitis: Secondary | ICD-10-CM | POA: Diagnosis not present

## 2016-07-14 DIAGNOSIS — C50912 Malignant neoplasm of unspecified site of left female breast: Secondary | ICD-10-CM | POA: Diagnosis not present

## 2016-07-14 DIAGNOSIS — I10 Essential (primary) hypertension: Secondary | ICD-10-CM | POA: Insufficient documentation

## 2016-07-14 DIAGNOSIS — C50412 Malignant neoplasm of upper-outer quadrant of left female breast: Secondary | ICD-10-CM | POA: Diagnosis not present

## 2016-07-14 DIAGNOSIS — Z87891 Personal history of nicotine dependence: Secondary | ICD-10-CM | POA: Diagnosis not present

## 2016-07-14 DIAGNOSIS — Z170 Estrogen receptor positive status [ER+]: Secondary | ICD-10-CM

## 2016-07-14 DIAGNOSIS — N6320 Unspecified lump in the left breast, unspecified quadrant: Secondary | ICD-10-CM

## 2016-07-14 DIAGNOSIS — Z9221 Personal history of antineoplastic chemotherapy: Secondary | ICD-10-CM | POA: Diagnosis not present

## 2016-07-14 HISTORY — PX: BREAST LUMPECTOMY WITH SENTINEL LYMPH NODE BIOPSY: SHX5597

## 2016-07-14 SURGERY — BREAST LUMPECTOMY WITH SENTINEL LYMPH NODE BX
Anesthesia: General | Laterality: Left | Wound class: Clean

## 2016-07-14 MED ORDER — BUPIVACAINE-EPINEPHRINE (PF) 0.5% -1:200000 IJ SOLN
INTRAMUSCULAR | Status: AC
  Filled 2016-07-14: qty 30

## 2016-07-14 MED ORDER — BUPIVACAINE-EPINEPHRINE (PF) 0.5% -1:200000 IJ SOLN
INTRAMUSCULAR | Status: DC | PRN
  Administered 2016-07-14: 30 mL

## 2016-07-14 MED ORDER — ACETAMINOPHEN 10 MG/ML IV SOLN
INTRAVENOUS | Status: AC
  Filled 2016-07-14: qty 100

## 2016-07-14 MED ORDER — PROPOFOL 10 MG/ML IV BOLUS
INTRAVENOUS | Status: AC
  Filled 2016-07-14: qty 20

## 2016-07-14 MED ORDER — PROMETHAZINE HCL 25 MG/ML IJ SOLN: 6.2500 mg | INTRAMUSCULAR | Status: DC | PRN

## 2016-07-14 MED ORDER — SCOPOLAMINE 1 MG/3DAYS TD PT72
MEDICATED_PATCH | TRANSDERMAL | Status: AC
  Filled 2016-07-14: qty 1

## 2016-07-14 MED ORDER — TECHNETIUM TC 99M SULFUR COLLOID FILTERED
1.0000 | Freq: Once | INTRAVENOUS | Status: AC | PRN
  Administered 2016-07-14: 0.717 via INTRADERMAL

## 2016-07-14 MED ORDER — IPRATROPIUM-ALBUTEROL 0.5-2.5 (3) MG/3ML IN SOLN: 3.0000 mL | RESPIRATORY_TRACT | Status: DC

## 2016-07-14 MED ORDER — PHENYLEPHRINE HCL 10 MG/ML IJ SOLN
INTRAMUSCULAR | Status: DC | PRN
  Administered 2016-07-14 (×3): 100 ug via INTRAVENOUS

## 2016-07-14 MED ORDER — DEXAMETHASONE SODIUM PHOSPHATE 10 MG/ML IJ SOLN
INTRAMUSCULAR | Status: AC
  Filled 2016-07-14: qty 1

## 2016-07-14 MED ORDER — METHYLENE BLUE 0.5 % INJ SOLN
INTRAVENOUS | Status: AC
  Filled 2016-07-14: qty 10

## 2016-07-14 MED ORDER — IPRATROPIUM-ALBUTEROL 0.5-2.5 (3) MG/3ML IN SOLN
RESPIRATORY_TRACT | Status: AC
  Administered 2016-07-14: 3 mL
  Filled 2016-07-14: qty 3

## 2016-07-14 MED ORDER — ONDANSETRON HCL 4 MG/2ML IJ SOLN
INTRAMUSCULAR | Status: AC
  Filled 2016-07-14: qty 2

## 2016-07-14 MED ORDER — OXYCODONE HCL 5 MG/5ML PO SOLN: 5.0000 mg | Freq: Once | ORAL | Status: DC | PRN

## 2016-07-14 MED ORDER — ONDANSETRON HCL 4 MG/2ML IJ SOLN
INTRAMUSCULAR | Status: DC | PRN
  Administered 2016-07-14: 4 mg via INTRAVENOUS

## 2016-07-14 MED ORDER — KETOROLAC TROMETHAMINE 30 MG/ML IJ SOLN
INTRAMUSCULAR | Status: DC | PRN
  Administered 2016-07-14: 15 mg via INTRAVENOUS

## 2016-07-14 MED ORDER — NAPROXEN 500 MG PO TABS
500.0000 mg | ORAL_TABLET | Freq: Two times a day (BID) | ORAL | Status: DC
  Filled 2016-07-14 (×2): qty 1

## 2016-07-14 MED ORDER — METHYLENE BLUE 0.5 % INJ SOLN
INTRAVENOUS | Status: DC | PRN
  Administered 2016-07-14: 5 mL

## 2016-07-14 MED ORDER — SODIUM CHLORIDE 0.9 % IV SOLN
INTRAVENOUS | Status: DC | PRN
  Administered 2016-07-14: 10 ug/min via INTRAVENOUS

## 2016-07-14 MED ORDER — DEXAMETHASONE SODIUM PHOSPHATE 10 MG/ML IJ SOLN
INTRAMUSCULAR | Status: DC | PRN
  Administered 2016-07-14: 4 mg via INTRAVENOUS

## 2016-07-14 MED ORDER — OXYCODONE HCL 5 MG PO TABS
5.0000 mg | ORAL_TABLET | Freq: Once | ORAL | Status: DC | PRN
Start: 2016-07-14 — End: 2016-07-14

## 2016-07-14 MED ORDER — FENTANYL CITRATE (PF) 100 MCG/2ML IJ SOLN
INTRAMUSCULAR | Status: AC
  Filled 2016-07-14: qty 2

## 2016-07-14 MED ORDER — ACETAMINOPHEN 10 MG/ML IV SOLN
INTRAVENOUS | Status: DC | PRN
  Administered 2016-07-14: 1000 mg via INTRAVENOUS

## 2016-07-14 MED ORDER — HYDROCODONE-ACETAMINOPHEN 5-325 MG PO TABS: 1.0000 | ORAL_TABLET | ORAL | 0 refills | Status: DC | PRN

## 2016-07-14 MED ORDER — FAMOTIDINE 20 MG PO TABS
ORAL_TABLET | ORAL | Status: DC
Start: 2016-07-14 — End: 2016-07-14
  Filled 2016-07-14: qty 1

## 2016-07-14 MED ORDER — MEPERIDINE HCL 50 MG/ML IJ SOLN
6.2500 mg | INTRAMUSCULAR | Status: DC | PRN
Start: 2016-07-14 — End: 2016-07-14

## 2016-07-14 MED ORDER — PROPOFOL 10 MG/ML IV BOLUS
INTRAVENOUS | Status: DC | PRN
  Administered 2016-07-14: 180 mg via INTRAVENOUS
  Administered 2016-07-14: 20 mg via INTRAVENOUS

## 2016-07-14 MED ORDER — PIPERACILLIN-TAZOBACTAM 3.375 G IVPB
INTRAVENOUS | Status: AC
  Administered 2016-07-14: 3.375 g
  Filled 2016-07-14: qty 50

## 2016-07-14 MED ORDER — FENTANYL CITRATE (PF) 100 MCG/2ML IJ SOLN
25.0000 ug | INTRAMUSCULAR | Status: DC | PRN
  Administered 2016-07-14: 25 ug via INTRAVENOUS

## 2016-07-14 MED ORDER — SCOPOLAMINE 1 MG/3DAYS TD PT72
1.0000 | MEDICATED_PATCH | TRANSDERMAL | Status: DC
  Administered 2016-07-14: 1.5 mg via TRANSDERMAL

## 2016-07-14 MED ORDER — LACTATED RINGERS IV SOLN
INTRAVENOUS | Status: DC
  Administered 2016-07-14: 10:00:00 via INTRAVENOUS

## 2016-07-14 MED ORDER — FENTANYL CITRATE (PF) 100 MCG/2ML IJ SOLN
INTRAMUSCULAR | Status: DC | PRN
  Administered 2016-07-14 (×2): 50 ug via INTRAVENOUS

## 2016-07-14 SURGICAL SUPPLY — 62 items
APPLIER CLIP 11 MED OPEN (CLIP)
APPLIER CLIP 13 LRG OPEN (CLIP)
BANDAGE ELASTIC 6 LF NS (GAUZE/BANDAGES/DRESSINGS) IMPLANT
BLADE SURG 15 STRL SS SAFETY (BLADE) ×2 IMPLANT
BNDG GAUZE 4.5X4.1 6PLY STRL (MISCELLANEOUS) IMPLANT
BRA SURGICAL MEDIUM (MISCELLANEOUS) ×2 IMPLANT
BULB RESERV EVAC DRAIN JP 100C (MISCELLANEOUS) IMPLANT
CANISTER SUCT 1200ML W/VALVE (MISCELLANEOUS) ×2 IMPLANT
CHLORAPREP W/TINT 26ML (MISCELLANEOUS) ×2 IMPLANT
CLIP APPLIE 11 MED OPEN (CLIP) IMPLANT
CLIP APPLIE 13 LRG OPEN (CLIP) IMPLANT
CNTNR SPEC 2.5X3XGRAD LEK (MISCELLANEOUS) ×1
CONT SPEC 4OZ STER OR WHT (MISCELLANEOUS) ×1
CONTAINER SPEC 2.5X3XGRAD LEK (MISCELLANEOUS) ×1 IMPLANT
COVER PROBE FLX POLY STRL (MISCELLANEOUS) ×2 IMPLANT
DEVICE DUBIN SPECIMEN MAMMOGRA (MISCELLANEOUS) ×2 IMPLANT
DRAIN CHANNEL JP 15F RND 16 (MISCELLANEOUS) IMPLANT
DRAPE LAPAROTOMY TRNSV 106X77 (MISCELLANEOUS) ×2 IMPLANT
DRSG TELFA 3X8 NADH (GAUZE/BANDAGES/DRESSINGS) ×2 IMPLANT
ELECT CAUTERY BLADE TIP 2.5 (TIP) ×2
ELECT REM PT RETURN 9FT ADLT (ELECTROSURGICAL) ×2
ELECTRODE CAUTERY BLDE TIP 2.5 (TIP) ×1 IMPLANT
ELECTRODE REM PT RTRN 9FT ADLT (ELECTROSURGICAL) ×1 IMPLANT
GAUZE FLUFF 18X24 1PLY STRL (GAUZE/BANDAGES/DRESSINGS) ×2 IMPLANT
GAUZE SPONGE 4X4 12PLY STRL (GAUZE/BANDAGES/DRESSINGS) IMPLANT
GLOVE BIO SURGEON STRL SZ7.5 (GLOVE) ×8 IMPLANT
GLOVE INDICATOR 8.0 STRL GRN (GLOVE) ×4 IMPLANT
GOWN STRL REUS W/ TWL LRG LVL3 (GOWN DISPOSABLE) ×2 IMPLANT
GOWN STRL REUS W/TWL LRG LVL3 (GOWN DISPOSABLE) ×2
HARMONIC SCALPEL FOCUS (MISCELLANEOUS) IMPLANT
KIT RM TURNOVER STRD PROC AR (KITS) ×2 IMPLANT
LABEL OR SOLS (LABEL) ×2 IMPLANT
MARGIN MAP 10MM (MISCELLANEOUS) ×2 IMPLANT
NDL SAFETY 22GX1.5 (NEEDLE) ×2 IMPLANT
NEEDLE HYPO 25X1 1.5 SAFETY (NEEDLE) ×2 IMPLANT
PACK BASIN MINOR ARMC (MISCELLANEOUS) ×2 IMPLANT
PIN SAFETY STRL (MISCELLANEOUS) IMPLANT
SHEARS FOC LG CVD HARMONIC 17C (MISCELLANEOUS) IMPLANT
SLEVE PROBE SENORX GAMMA FIND (MISCELLANEOUS) ×2 IMPLANT
SPONGE LAP 18X18 5 PK (GAUZE/BANDAGES/DRESSINGS) IMPLANT
STRIP CLOSURE SKIN 1/2X4 (GAUZE/BANDAGES/DRESSINGS) ×2 IMPLANT
SUT ETHILON 3-0 FS-10 30 BLK (SUTURE) ×2
SUT SILK 0 (SUTURE)
SUT SILK 0 30XBRD TIE 6 (SUTURE) IMPLANT
SUT SILK 2 0 (SUTURE) ×1
SUT SILK 2-0 18XBRD TIE 12 (SUTURE) ×1 IMPLANT
SUT SILK 3 0 (SUTURE)
SUT SILK 3-0 18XBRD TIE 12 (SUTURE) IMPLANT
SUT VIC AB 2-0 CT1 27 (SUTURE) ×3
SUT VIC AB 2-0 CT1 TAPERPNT 27 (SUTURE) ×3 IMPLANT
SUT VIC AB 2-0 CT2 27 (SUTURE) IMPLANT
SUT VIC AB 3-0 SH 27 (SUTURE) ×1
SUT VIC AB 3-0 SH 27X BRD (SUTURE) ×1 IMPLANT
SUT VIC AB 4-0 FS2 27 (SUTURE) ×2 IMPLANT
SUT VICRYL+ 3-0 144IN (SUTURE) ×2 IMPLANT
SUTURE EHLN 3-0 FS-10 30 BLK (SUTURE) ×1 IMPLANT
SWABSTK COMLB BENZOIN TINCTURE (MISCELLANEOUS) ×2 IMPLANT
SYR BULB IRRIG 60ML STRL (SYRINGE) ×2 IMPLANT
SYR CONTROL 10ML (SYRINGE) ×2 IMPLANT
SYRINGE 10CC LL (SYRINGE) ×2 IMPLANT
TAPE TRANSPORE STRL 2 31045 (GAUZE/BANDAGES/DRESSINGS) IMPLANT
WATER STERILE IRR 1000ML POUR (IV SOLUTION) ×2 IMPLANT

## 2016-07-14 NOTE — OR Nursing (Signed)
Pt advises she wishes to take anaprox when she gets home.  Order d/c'd and pharmacy notified of same.  Dr. Bary Castilla in to see patient prior to d/c

## 2016-07-14 NOTE — Op Note (Addendum)
Preoperative diagnosis: Left breast cancer status post neoadjuvant chemotherapy.  Postoperative diagnosis: Same.  Operative procedure wide local excision with ultrasound guidance, mastoplasty, sentinel node biopsy.  Operating surgeon Ollen Bowl, M.D.  Anesthesia Gen. by LMA, Marcaine 0.5% with 1-200,000 of epinephrine, 30 mL.  Estimated blood loss: Less than 10 mL.  Clinical note: This 76 year old woman had had an abnormal mammogram and underwent core biopsy showing evidence of invasive cancer. She had stage II disease and received neoadjuvant chemotherapy. She is completely with therapy with no demonstrable change in tumor size based on serial ultrasound exam. Plans were for breast conservation with the possibility of mastectomy if needed for margins.  The patient received an injection of technetium sulfur colloid prior to the procedure.  The patient had had a urinary tract infection in February and antibiotic prophylaxis was chosen based on the results of that study.  Operative note: With the patient under adequate general anesthesia the area of the nipple was prepped with alcohol and 5 mL of 0.5% methylene blue was instilled. The breast chest and axilla was then prepped with ChloraPrep and draped. Ultrasound was used to identify the mass in the upper-outer quadrant of the left breast. This was an ill-defined hypoechoic mass in the 2:00 position near the axillary tail. The margins of the mass were marked by ultrasound and then a curvilinear incision in the upper-outer quadrant proctoscopy 7 cm in length was completed. The skin was incised sharply and the mass was excised down to and including the pectoralis fascia. The lesion was orientated and specimen radiograph confirmed a mass in the center of the second specimen with the previously placed clip.  Attention was turned to the axilla and the node seeker used to identify the axillary nodes. The largest measuring approximate centimeter in  diameter and was hot and blue. Touch preps on this were negative for macro metastatic disease. 2 smaller, 4 mm hot nodes were identified. A non-sentinel node measuring about 3 mm in diameter was sent for routine histology.  There was a moderate defect in the upper-outer quadrant of the breast after resecting the 5 cm block of tissue. The breast and pectoralis fascia was mobilized to the medial aspect of the breast and inferior below the nipple. The lateral aspect of the parenchyma was mobilized to the mid axillary line. The fascia was then approximated with interrupted 2-0 Vicryl figure-of-eight sutures. The superficial breast tissue was then approximated perpendicular to the fascial closure with interrupted 2-0 Vicryl sutures. This provided an adequate buffer in the effect the patient was a candidate for accelerated partial breast radiation.  There was a slight ridge in the skin and the skin was mobilized to smooth this area and allow simple closure with a running 4-0 Vicryl subcuticular suture. The mastoplasty resulted in a good preservation of the breast contour. Benzoin, Steri-Strips, and a Telfa dressing was applied. Fluff gauze and a surgical bra was placed.  The patient tolerated the procedure well and was taken the recovery room in stable condition.

## 2016-07-14 NOTE — Anesthesia Post-op Follow-up Note (Cosign Needed)
Anesthesia QCDR form completed.        

## 2016-07-14 NOTE — H&P (Signed)
Patient for left excision, possible mastectomy w/ SLN biopsy.  Developed sinus infection last week and started on Amoxacillin.  No cough at that time.  Feeling well.  Pronounced cough this AM after admission to [facility]. NO wheezing, rales, rhonchi. Slight prolongation of expiratory phase.  Will proceed w/ surgery.

## 2016-07-14 NOTE — Anesthesia Procedure Notes (Signed)
Procedure Name: LMA Insertion Performed by: Lance Muss Pre-anesthesia Checklist: Patient identified, Patient being monitored, Timeout performed, Emergency Drugs available and Suction available Patient Re-evaluated:Patient Re-evaluated prior to inductionOxygen Delivery Method: Circle system utilized Preoxygenation: Pre-oxygenation with 100% oxygen Intubation Type: IV induction Ventilation: Mask ventilation without difficulty LMA: LMA inserted LMA Size: 4.5 Tube type: Oral Number of attempts: 1 Placement Confirmation: positive ETCO2 and breath sounds checked- equal and bilateral Tube secured with: Tape Dental Injury: Teeth and Oropharynx as per pre-operative assessment  Comments: First attempt with 3.5 LMA with significant leak, LMA 4.5 inserted with +BBS, +ETCO2.

## 2016-07-14 NOTE — Anesthesia Preprocedure Evaluation (Signed)
Anesthesia Evaluation  Patient identified by MRN, date of birth, ID band Patient awake    Reviewed: Allergy & Precautions, NPO status , Patient's Chart, lab work & pertinent test results  History of Anesthesia Complications (+) PONV and history of anesthetic complications  Airway Mallampati: II  TM Distance: >3 FB Neck ROM: Full    Dental  (+) Edentulous Upper, Edentulous Lower   Pulmonary neg sleep apnea, neg COPD, former smoker,    breath sounds clear to auscultation- rhonchi (-) wheezing      Cardiovascular Exercise Tolerance: Good hypertension, Pt. on medications (-) CAD and (-) Past MI  Rhythm:Regular Rate:Normal - Systolic murmurs and - Diastolic murmurs    Neuro/Psych negative psych ROS   GI/Hepatic Neg liver ROS, GERD  ,  Endo/Other  negative endocrine ROSneg diabetes  Renal/GU negative Renal ROS     Musculoskeletal  (+) Arthritis ,   Abdominal (+) - obese,   Peds  Hematology negative hematology ROS (+)   Anesthesia Other Findings Past Medical History: No date: Arthritis 03/13/2016: Cancer ([place])     Comment: INVASIVE MAMMARY CARCINOMA No date: Chickenpox No date: Complication of anesthesia No date: Frequent UTI No date: GERD (gastroesophageal reflux disease)     Comment: RARE No date: Hypertension No date: PONV (postoperative nausea and vomiting) No date: RSD (reflex sympathetic dystrophy) No date: Seasonal allergies No date: Skin cancer No date: Urine incontinence   Reproductive/Obstetrics                             Anesthesia Physical Anesthesia Plan  ASA: III  Anesthesia Plan: General   Post-op Pain Management:    Induction: Intravenous  Airway Management Planned: LMA  Additional Equipment:   Intra-op Plan:   Post-operative Plan:   Informed Consent: I have reviewed the patients History and Physical, chart, labs and discussed the procedure including the  risks, benefits and alternatives for the proposed anesthesia with the patient or authorized representative who has indicated his/her understanding and acceptance.   Dental advisory given  Plan Discussed with: CRNA and Anesthesiologist  Anesthesia Plan Comments:         Anesthesia Quick Evaluation

## 2016-07-14 NOTE — Transfer of Care (Signed)
Immediate Anesthesia Transfer of Care Note  Patient: Catherine Tate  Procedure(s) Performed: Procedure(s): BREAST LUMPECTOMY WITH SENTINEL LYMPH NODE BX (Left)  Patient Location: PACU  Anesthesia Type:General  Level of Consciousness: sedated and responds to stimulation  Airway & Oxygen Therapy: Patient Spontanous Breathing and Patient connected to face mask oxygen  Post-op Assessment: Report given to RN and Post -op Vital signs reviewed and stable  Post vital signs: Reviewed and stable  Last Vitals:  Vitals:   07/14/16 0844 07/14/16 1119  BP: (!) 161/81 124/75  Pulse: (!) 101 88  Resp: 18 13  Temp: 36.7 C     Last Pain:  Vitals:   07/14/16 0844  TempSrc: Oral         Complications: No apparent anesthesia complications

## 2016-07-14 NOTE — Discharge Instructions (Signed)

## 2016-07-15 ENCOUNTER — Encounter: Payer: Self-pay | Admitting: General Surgery

## 2016-07-15 NOTE — Anesthesia Postprocedure Evaluation (Signed)
Anesthesia Post Note  Patient: Catherine Tate  Procedure(s) Performed: Procedure(s) (LRB): BREAST LUMPECTOMY WITH SENTINEL LYMPH NODE BX (Left)  Patient location during evaluation: PACU Anesthesia Type: General Level of consciousness: awake and alert and oriented Pain management: pain level controlled Vital Signs Assessment: post-procedure vital signs reviewed and stable Respiratory status: spontaneous breathing, nonlabored ventilation and respiratory function stable Cardiovascular status: blood pressure returned to baseline and stable Postop Assessment: no signs of nausea or vomiting Anesthetic complications: no     Last Vitals:  Vitals:   07/14/16 1238 07/14/16 1256  BP: 136/69 (!) 158/71  Pulse: (!) 109 (!) 111  Resp: 18 18  Temp: 36.3 C 36.4 C    Last Pain:  Vitals:   07/14/16 1256  TempSrc: Temporal  PainSc: 3                  Bobby Barton

## 2016-07-16 ENCOUNTER — Telehealth: Payer: Self-pay | Admitting: General Surgery

## 2016-07-16 LAB — SURGICAL PATHOLOGY

## 2016-07-16 NOTE — Telephone Encounter (Signed)
Patient was resting when I called. Informed her son that the margins were all clear and the lymph nodes were all negative.  He reports she is doing well. Follow up as scheduled on April 9th.

## 2016-07-20 NOTE — Progress Notes (Deleted)
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  INTERVAL HISTORY: Patient returns to clinic today for further evaluation and consideration of cycle 4 of 4 of neoadjuvant Taxotere and Cytoxan. She currently feels well and is asymptomatic. She has no neurologic complaints. She denies any recent fevers, chills, or illnesses. She has no chest pain or shortness of breath. She denies any nausea, vomiting, constipation, or diarrhea. She has no further urinary complaints. Patient offers no specific complaints today.  REVIEW OF SYSTEMS:   Review of Systems  Constitutional: Negative.  Negative for fever, malaise/fatigue and weight loss.  HENT: Negative for sinus pain.   Respiratory: Negative.  Negative for cough and shortness of breath.   Cardiovascular: Negative.  Negative for chest pain and leg swelling.  Gastrointestinal: Negative.  Negative for abdominal pain and constipation.  Genitourinary: Negative for hematuria and urgency.  Musculoskeletal: Negative.   Skin: Negative for itching and rash.  Neurological: Negative.  Negative for weakness and headaches.  Psychiatric/Behavioral: The patient is nervous/anxious and has insomnia.     As per HPI. Otherwise, a complete review of systems is negative.  PAST MEDICAL HISTORY: Past Medical History:  Diagnosis Date  . Arthritis   . Cancer ([place]) 03/13/2016   INVASIVE MAMMARY CARCINOMA  . Chickenpox   . Complication of anesthesia   . Frequent UTI   . GERD (gastroesophageal reflux disease)    RARE  . Hypertension   . PONV (postoperative  nausea and vomiting)   . RSD (reflex sympathetic dystrophy)   . Seasonal allergies   . Skin cancer   . Urine incontinence     PAST SURGICAL HISTORY: Past Surgical History:  Procedure Laterality Date  . ABDOMINAL HYSTERECTOMY  2005   total/ Dr Davis Gourd  . BLADDER TUMOR EXCISION  2006  . BREAST BIOPSY Left 03/13/2016   INVASIVE MAMMARY CARCINOMA  . BREAST LUMPECTOMY WITH SENTINEL LYMPH NODE BIOPSY Left 07/14/2016   Procedure: BREAST LUMPECTOMY WITH SENTINEL LYMPH NODE BX;  Surgeon: [name], MD;  Location: [facility] ORS;  Service: General;  Laterality: Left;  . PORTACATH PLACEMENT N/A 04/01/2016   Procedure: INSERTION PORT-A-CATH;  Surgeon: [name], MD;  Location: [facility] ORS;  Service: General;  Laterality: N/A;  . SKIN CANCER EXCISION Right 2015   foot/ Dr Nevada Crane  . TUBAL LIGATION    . TUMOR EXCISION Right    FOOT    FAMILY HISTORY: Family History  Problem Relation Age of Onset  . Emphysema Father   . Heart disease Mother   . Transient ischemic attack Mother   . Breast cancer Cousin     paternal    ADVANCED DIRECTIVES (Y/N):  N  HEALTH MAINTENANCE: Social History  Substance Use Topics  . Smoking status: Former Smoker    Packs/day: 1.00    Years: 20.00    Types: Cigarettes    Quit date: 04/14/1994  . Smokeless tobacco: Never Used  . Alcohol use No     Colonoscopy:  PAP:  Bone density:  Lipid panel:  Allergies  Allergen Reactions  . Bee Pollen Hives  . Mosquito (Diagnostic) Hives  . Other Other (See  Comments)    Uncoded Allergy. Allergen: VICRYL  (90% glycolide and 10% L-lactide [polyglactin-- synthetic absorbable sterile surgical suture]), Other Reaction: Infection  . Gabapentin Swelling  . Norvasc [Amlodipine Besylate] Swelling    TO FEET  . Lisinopril Cough    Current Outpatient Prescriptions  Medication Sig Dispense Refill  . acetaminophen (TYLENOL) 500 MG tablet Take 1,000 mg by mouth every 6 (six) hours as needed for moderate pain.       Marland Kitchen amoxicillin (AMOXIL) 875 MG tablet Take 1 tablet (875 mg total) by mouth 2 (two) times daily. 14 tablet 0  . Ascorbic Acid (VITAMIN C) 1000 MG tablet Take 1,000 mg by mouth daily.    . benzonatate (TESSALON) 100 MG capsule Take 1 capsule (100 mg total) by mouth 2 (two) times daily as needed for cough. 20 capsule 0  . CRANBERRY PO Take 1 capsule by mouth 2 (two) times daily.    . hydrochlorothiazide (HYDRODIURIL) 25 MG tablet Take 1 tablet (25 mg total) by mouth daily. 90 tablet 1  . HYDROcodone-acetaminophen (NORCO) 5-325 MG tablet Take 1-2 tablets by mouth every 4 (four) hours as needed for moderate pain. 30 tablet 0  . lidocaine-prilocaine (EMLA) cream Apply 1 application topically as needed. Apply cream to port 1-2 hours prior to chemotherapy appointment. Cover with plastic wrap. (Patient not taking: Reported on 07/09/2016) 30 g 2  . loratadine (CLARITIN) 10 MG tablet Take 10 mg by mouth daily as needed for allergies.    Marland Kitchen losartan (COZAAR) 50 MG tablet Take 1 tablet (50 mg total) by mouth daily. (Patient taking differently: Take 50 mg by mouth every morning. ) 90 tablet 0  . Probiotic Product (PROBIOTIC PO) Take 1 tablet by mouth daily.    Marland Kitchen triamcinolone (KENALOG) 0.025 % ointment Apply 1 application topically 2 (two) times daily. 30 g 0   No current facility-administered medications for this visit.     OBJECTIVE: There were no vitals filed for this visit.   There is no height or weight on file to calculate BMI.    ECOG FS:0 - Asymptomatic  General: Well-developed, well-nourished, no acute distress. Eyes: Pink conjunctiva, anicteric sclera. Breasts: Palpable left breast nodule. Exam deferred today. Lungs: Clear to auscultation bilaterally. Heart: Regular rate and rhythm. No rubs, murmurs, or gallops. Abdomen: Soft, nontender, nondistended. No organomegaly noted, normoactive bowel sounds. Musculoskeletal: No edema, cyanosis, or clubbing. Neuro: Alert, answering all questions  appropriately. Cranial nerves grossly intact. Skin: Diffuse small red papules, approximately 1/2 cm diameter, on bilateral arms. Psych: Normal affect.   LAB RESULTS:  Lab Results  Component Value Date   NA 135 06/05/2016   K 4.4 07/07/2016   CL 104 06/05/2016   CO2 25 06/05/2016   GLUCOSE 136 (H) 06/05/2016   BUN 23 (H) 06/05/2016   CREATININE 1.09 (H) 06/05/2016   CALCIUM 8.9 06/05/2016   PROT 6.7 06/05/2016   ALBUMIN 3.8 06/05/2016   AST 26 06/05/2016   ALT 21 06/05/2016   ALKPHOS 57 06/05/2016   BILITOT 0.7 06/05/2016   GFRNONAA 49 (L) 06/05/2016   GFRAA 57 (L) 06/05/2016    Lab Results  Component Value Date   WBC 5.7 06/05/2016   NEUTROABS 3.2 06/05/2016   HGB 10.7 (L) 07/07/2016   HCT 29.8 (L) 06/05/2016   MCV 92.2 06/05/2016   PLT 247 06/05/2016     STUDIES: Nm Sentinel Node Injection  Result Date: 07/14/2016 CLINICAL DATA:  Left breast cancer. EXAM: NUCLEAR MEDICINE BREAST LYMPHOSCINTIGRAPHY TECHNIQUE: Intradermal injection  of radiopharmaceutical was performed at the 12 o'clock, 3 o'clock, 6 o'clock, and 9 o'clock positions around the left nipple. The patient was then sent to the operating room where the sentinel node(s) were identified and removed by the surgeon. RADIOPHARMACEUTICALS:  Total of 1 mCi Millipore-filtered Technetium-45msulfur colloid, injected in four aliquots of 0.25 mCi each. IMPRESSION: Uncomplicated intradermal injection of a total of 1 mCi Technetium-980mulfur colloid for purposes of sentinel node identification. Electronically Signed   By: GlAletta Edouard.D.   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  PLAN:    1. 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 FISH was negative for HER-2 therefore she does not require Herceptin. Also, given her EF of 41% cannot use Adriamycin. Proceed with cycle 4 of 4 of neoadjuvant Taxotere and Cytoxan. Patient has been referred back to surgery for consideration of lumpectomy or mastectomy. She will also require adjuvant XRT if she undergoes lumpectomy.  Given the ER/PR status of patient's tumor, she will benefit from an aromatase inhibitor at the conclusion of all her treatments. Return to clinic in 1-2 weeks post-operatively for further evaluation and additional treatment planning.  2. Hyponatremia: Resolved. 3. Constipation: Continue OTC treatments as needed.  Approximately 30 minutes was spent in discussion of which greater than 30 minutes was consultation.  Patient expressed understanding and was in agreement with this plan. She also understands that She can call clinic at any time with any questions, concerns, or complaints.   Cancer Staging Malignant neoplasm of upper-outer quadrant of left female breast ([facility] LLCStaging form: Breast, AJCC 7th Edition - Clinical stage from 03/19/2016: Stage IIA (T2, N0, M0) - Signed by TiLloyd HugerMD on 04/06/2016    TiLloyd HugerMD 07/20/16 10:17 PM

## 2016-07-21 ENCOUNTER — Other Ambulatory Visit: Payer: Self-pay | Admitting: Primary Care

## 2016-07-21 ENCOUNTER — Encounter: Payer: Self-pay | Admitting: General Surgery

## 2016-07-21 ENCOUNTER — Ambulatory Visit (INDEPENDENT_AMBULATORY_CARE_PROVIDER_SITE_OTHER): Payer: PPO | Admitting: General Surgery

## 2016-07-21 ENCOUNTER — Inpatient Hospital Stay: Payer: Self-pay

## 2016-07-21 ENCOUNTER — Ambulatory Visit
Admission: RE | Admit: 2016-07-21 | Discharge: 2016-07-21 | Disposition: A | Discharge status: Home / Self Care | Payer: PPO | Source: Ambulatory Visit | Attending: Radiation Oncology | Admitting: Radiation Oncology

## 2016-07-21 ENCOUNTER — Inpatient Hospital Stay: Payer: PPO | Attending: Oncology | Admitting: Oncology

## 2016-07-21 ENCOUNTER — Inpatient Hospital Stay: Payer: PPO | Admitting: Oncology

## 2016-07-21 ENCOUNTER — Ambulatory Visit: Payer: PPO | Admitting: General Surgery

## 2016-07-21 VITALS — BP 150/85 | HR 92 | Temp 96.9°F | Resp 18 | Wt 161.7 lb

## 2016-07-21 VITALS — BP 132/68 | HR 76 | Resp 12 | Ht 67.0 in | Wt 166.0 lb

## 2016-07-21 DIAGNOSIS — Z87891 Personal history of nicotine dependence: Secondary | ICD-10-CM | POA: Diagnosis not present

## 2016-07-21 DIAGNOSIS — R32 Unspecified urinary incontinence: Secondary | ICD-10-CM | POA: Diagnosis not present

## 2016-07-21 DIAGNOSIS — M129 Arthropathy, unspecified: Secondary | ICD-10-CM | POA: Diagnosis not present

## 2016-07-21 DIAGNOSIS — K5900 Constipation, unspecified: Secondary | ICD-10-CM | POA: Diagnosis not present

## 2016-07-21 DIAGNOSIS — Z803 Family history of malignant neoplasm of breast: Secondary | ICD-10-CM | POA: Insufficient documentation

## 2016-07-21 DIAGNOSIS — Z170 Estrogen receptor positive status [ER+]: Secondary | ICD-10-CM

## 2016-07-21 DIAGNOSIS — C50412 Malignant neoplasm of upper-outer quadrant of left female breast: Secondary | ICD-10-CM

## 2016-07-21 DIAGNOSIS — Z87440 Personal history of urinary (tract) infections: Secondary | ICD-10-CM | POA: Insufficient documentation

## 2016-07-21 DIAGNOSIS — I10 Essential (primary) hypertension: Secondary | ICD-10-CM

## 2016-07-21 DIAGNOSIS — Z85828 Personal history of other malignant neoplasm of skin: Secondary | ICD-10-CM | POA: Insufficient documentation

## 2016-07-21 DIAGNOSIS — Z79899 Other long term (current) drug therapy: Secondary | ICD-10-CM | POA: Diagnosis not present

## 2016-07-21 DIAGNOSIS — K219 Gastro-esophageal reflux disease without esophagitis: Secondary | ICD-10-CM | POA: Insufficient documentation

## 2016-07-21 DIAGNOSIS — G9050 Complex regional pain syndrome I, unspecified: Secondary | ICD-10-CM

## 2016-07-21 DIAGNOSIS — Z87442 Personal history of urinary calculi: Secondary | ICD-10-CM

## 2016-07-21 DIAGNOSIS — F17210 Nicotine dependence, cigarettes, uncomplicated: Secondary | ICD-10-CM | POA: Insufficient documentation

## 2016-07-21 DIAGNOSIS — Z510 Encounter for antineoplastic radiation therapy: Secondary | ICD-10-CM | POA: Insufficient documentation

## 2016-07-21 HISTORY — DX: Estrogen receptor positive status (ER+): C50.412

## 2016-07-21 NOTE — Progress Notes (Signed)
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  INTERVAL HISTORY: Patient returns to clinic today for further evaluation and discussion of her final pathology results. She had a lumpectomy on July 14, 2016 and tolerated the procedure well. She continues to have mild tenderness at her surgical site, but otherwise feels well. She has no neurologic complaints. She denies any recent fevers, chills, or illnesses. She has no chest pain or shortness of breath. She denies any nausea, vomiting, constipation, or diarrhea. She has no urinary complaints. Patient offers no further specific complaints today.  REVIEW OF SYSTEMS:   Review of Systems  Constitutional: Negative.  Negative for fever, malaise/fatigue and weight loss.  HENT: Negative for sinus pain.   Respiratory: Negative.  Negative for cough and shortness of breath.   Cardiovascular: Negative.  Negative for chest pain and leg swelling.  Gastrointestinal: Negative.  Negative for abdominal pain and constipation.  Genitourinary: Negative for hematuria and urgency.  Musculoskeletal: Negative.   Skin: Negative for itching and rash.  Neurological: Negative.  Negative for weakness and headaches.  Psychiatric/Behavioral: Negative.  The patient is not nervous/anxious and does not have insomnia.     As per HPI. Otherwise, a complete review of systems is negative.  PAST MEDICAL HISTORY: Past Medical History:  Diagnosis Date  . Arthritis   . Cancer ([place]) 03/13/2016   INVASIVE MAMMARY CARCINOMA  . Chickenpox   . Complication of anesthesia     . Frequent UTI   . GERD (gastroesophageal reflux disease)    RARE  . Hypertension   . PONV (postoperative nausea and vomiting)   . RSD (reflex sympathetic dystrophy)   . Seasonal allergies   . Skin cancer   . Urine incontinence     PAST SURGICAL HISTORY: Past Surgical History:  Procedure Laterality Date  . ABDOMINAL HYSTERECTOMY  2005   total/ Dr Davis Gourd  . BLADDER TUMOR EXCISION  2006  . BREAST BIOPSY Left 03/13/2016   INVASIVE MAMMARY CARCINOMA  . BREAST LUMPECTOMY WITH SENTINEL LYMPH NODE BIOPSY Left 07/14/2016   Procedure: BREAST LUMPECTOMY WITH SENTINEL LYMPH NODE BX;  Surgeon: [name], MD;  Location: [facility] ORS;  Service: General;  Laterality: Left;  . PORTACATH PLACEMENT N/A 04/01/2016   Procedure: INSERTION PORT-A-CATH;  Surgeon: [name], MD;  Location: [facility] ORS;  Service: General;  Laterality: N/A;  . SKIN CANCER EXCISION Right 2015   foot/ Dr Nevada Crane  . TUBAL LIGATION    . TUMOR EXCISION Right    FOOT    FAMILY HISTORY: Family History  Problem Relation Age of Onset  . Emphysema Father   . Heart disease Mother   . Transient ischemic attack Mother   . Breast cancer Cousin     paternal    ADVANCED DIRECTIVES (Y/N):  N  HEALTH MAINTENANCE: Social History  Substance Use Topics  . Smoking status: Former Smoker    Packs/day: 1.00    Years: 20.00    Types: Cigarettes    Quit date: 04/14/1994  . Smokeless tobacco: Never Used  . Alcohol use No     Colonoscopy:  PAP:  Bone density:  Lipid  panel:  Allergies  Allergen Reactions  . Bee Pollen Hives  . Mosquito (Diagnostic) Hives  . Other Other (See Comments)    Uncoded Allergy. Allergen: VICRYL  (90% glycolide and 10% L-lactide [polyglactin-- synthetic absorbable sterile surgical suture]), Other Reaction: Infection  . Gabapentin Swelling  . Norvasc [Amlodipine Besylate] Swelling    TO FEET  . Lisinopril Cough    Current Outpatient Prescriptions  Medication Sig Dispense Refill  .  acetaminophen (TYLENOL) 500 MG tablet Take 1,000 mg by mouth every 6 (six) hours as needed for moderate pain.     . Ascorbic Acid (VITAMIN C) 1000 MG tablet Take 1,000 mg by mouth daily.    Marland Kitchen CRANBERRY PO Take 1 capsule by mouth 2 (two) times daily.    . hydrochlorothiazide (HYDRODIURIL) 25 MG tablet Take 1 tablet (25 mg total) by mouth daily. 90 tablet 1  . loratadine (CLARITIN) 10 MG tablet Take 10 mg by mouth daily as needed for allergies.    Marland Kitchen losartan (COZAAR) 50 MG tablet TAKE 1 TABLET BY MOUTH ONCE A DAY 90 tablet 1  . Probiotic Product (PROBIOTIC PO) Take 1 tablet by mouth daily.    Marland Kitchen lidocaine-prilocaine (EMLA) cream Apply 1 application topically as needed. Apply cream to port 1-2 hours prior to chemotherapy appointment. Cover with plastic wrap. (Patient not taking: Reported on 07/21/2016) 30 g 2  . triamcinolone (KENALOG) 0.025 % ointment Apply 1 application topically 2 (two) times daily. (Patient not taking: Reported on 07/21/2016) 30 g 0   No current facility-administered medications for this visit.     OBJECTIVE: Vitals:   07/21/16 1326  BP: (!) 150/85  Pulse: 92  Resp: 18  Temp: (!) 96.9 F (36.1 C)     Body mass index is 25.33 kg/m.    ECOG FS:0 - Asymptomatic  General: Well-developed, well-nourished, no acute distress. Eyes: Pink conjunctiva, anicteric sclera. Breasts: Well healing surgical scar in left breast. Lungs: Clear to auscultation bilaterally. Heart: Regular rate and rhythm. No rubs, murmurs, or gallops. Abdomen: Soft, nontender, nondistended. No organomegaly noted, normoactive bowel sounds. Musculoskeletal: No edema, cyanosis, or clubbing. Neuro: Alert, answering all questions appropriately. Cranial nerves grossly intact. Skin: No rash or petechiae noted. Psych: Normal affect.   LAB RESULTS:  Lab Results  Component Value Date   NA 135 06/05/2016   K 4.4 07/07/2016   CL 104 06/05/2016   CO2 25 06/05/2016   GLUCOSE 136 (H) 06/05/2016   BUN 23 (H)  06/05/2016   CREATININE 1.09 (H) 06/05/2016   CALCIUM 8.9 06/05/2016   PROT 6.7 06/05/2016   ALBUMIN 3.8 06/05/2016   AST 26 06/05/2016   ALT 21 06/05/2016   ALKPHOS 57 06/05/2016   BILITOT 0.7 06/05/2016   GFRNONAA 49 (L) 06/05/2016   GFRAA 57 (L) 06/05/2016    Lab Results  Component Value Date   WBC 5.7 06/05/2016   NEUTROABS 3.2 06/05/2016   HGB 10.7 (L) 07/07/2016   HCT 29.8 (L) 06/05/2016   MCV 92.2 06/05/2016   PLT 247 06/05/2016     STUDIES: Nm Sentinel Node Injection  Result Date: 07/14/2016 CLINICAL DATA:  Left breast cancer. EXAM: NUCLEAR MEDICINE BREAST LYMPHOSCINTIGRAPHY TECHNIQUE: Intradermal injection of radiopharmaceutical was performed at the 12 o'clock, 3 o'clock, 6 o'clock, and 9 o'clock positions around the left nipple. The patient was then sent to the operating room where the sentinel node(s) were identified and removed by the surgeon. RADIOPHARMACEUTICALS:  Total of 1 mCi Millipore-filtered Technetium-31msulfur colloid, injected in four  aliquots of 0.25 mCi each. IMPRESSION: Uncomplicated intradermal injection of a total of 1 mCi Technetium-44msulfur colloid for purposes of sentinel node identification. Electronically Signed   By: GAletta EdouardM.D.   On: 07/14/2016 09:28   [place]Breast Complete Uni Left Inc Axilla  Result Date: 07/21/2016 Ultrasound examination of the surgical site was undertaken to determine if the patient would be a candidate for accelerated partial breast radiation. At the wide excision site there is a 1.5 x 1.97 x 2.93 cm cavity approximately 1.6 cm below the skin. There is a very small fluid collection in the left axilla measuring 1 cm. Adequate spacing for MammoSite placement if indicated.    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  PLAN:    1. Pathologic stage IIa ER/PR positive, HER-2 negative invasive carcinoma of the upper outer quadrant of  the left breast: Patient completed 4 cycles of Taxotere and Cytoxan on June 05, 2016. She then underwent a lumpectomy on July 14, 2016. She had an appointment with radiation oncology earlier today and now will proceed with adjuvant XRT. Given the ER/PR status of patient's tumor, she will benefit from an aromatase inhibitor at the conclusion of all her treatments. Return to clinic in mid-June towards the end of her XRT for further evaluation and initiation of an aromatase inhibitor.  2. Hyponatremia: Resolved. 3. Constipation: Continue OTC treatments as needed. 4. Decreased EF%: Patient had a pretreatment EF of 41%. Continue monitoring evaluation per cardiology.   Patient expressed understanding and was in agreement with this plan. She also understands that She can call clinic at any time with any questions, concerns, or complaints.   Cancer Staging Malignant neoplasm of upper-outer quadrant of left female breast ([facility] Staging form: Breast, AJCC 7th Edition - Clinical stage from 03/19/2016: Stage IIA (T2, N0, M0) - Signed by TLloyd Huger MD on 04/06/2016 - Pathologic stage from 07/27/2016: Stage IIA (T2, N0, cM0) - Signed by TLloyd Huger MD on 07/27/2016    TLloyd Huger MD 07/27/16 9:20 PM

## 2016-07-21 NOTE — Progress Notes (Signed)
Patient ID: Catherine Tate, female   DOB: 1940-05-30, 76 y.o.   MRN: [id number]  Chief Complaint  Patient presents with  . Routine Post Op    HPI Catherine Tate is a 76 y.o. female here today for her post op left breast lumpectomy done on 07/14/2016. Patient states she is sore, taking Aleve for pain. HPI  Past Medical History:  Diagnosis Date  . Arthritis   . Cancer ([place] Tate) 03/13/2016   INVASIVE MAMMARY CARCINOMA  . Chickenpox   . Complication of anesthesia   . Frequent UTI   . GERD (gastroesophageal reflux disease)    RARE  . Hypertension   . PONV (postoperative nausea and vomiting)   . RSD (reflex sympathetic dystrophy)   . Seasonal allergies   . Skin cancer   . Urine incontinence     Past Surgical History:  Procedure Laterality Date  . ABDOMINAL HYSTERECTOMY  2005   total/ Dr Catherine Tate  . BLADDER TUMOR EXCISION  2006  . BREAST BIOPSY Left 03/13/2016   INVASIVE MAMMARY CARCINOMA  . BREAST LUMPECTOMY WITH SENTINEL LYMPH NODE BIOPSY Left 07/14/2016   Procedure: BREAST LUMPECTOMY WITH SENTINEL LYMPH NODE BX;  Surgeon: Catherine Bellow, MD;  Location: [facility] ORS;  Service: General;  Laterality: Left;  . PORTACATH PLACEMENT N/A 04/01/2016   Procedure: INSERTION PORT-A-CATH;  Surgeon: Catherine Bellow, MD;  Location: [facility] ORS;  Service: General;  Laterality: N/A;  . SKIN CANCER EXCISION Right 2015   foot/ Dr Catherine Tate  . TUBAL LIGATION    . TUMOR EXCISION Right    FOOT    Family History  Problem Relation Age of Onset  . Emphysema Father   . Heart disease Mother   . Transient ischemic attack Mother   . Breast cancer Cousin     paternal    Social History Social History  Substance Use Topics  . Smoking status: Former Smoker    Packs/day: 1.00    Years: 20.00    Types: Cigarettes    Quit date: 04/14/1994  . Smokeless tobacco: Never Used  . Alcohol use No    Allergies  Allergen Reactions  . Bee Pollen Hives  . Mosquito (Diagnostic) Hives  . Other Other (See Comments)     Uncoded Allergy. Allergen: Catherine Tate  (90% glycolide and 10% L-lactide [polyglactin-- synthetic absorbable sterile surgical suture]), Other Reaction: Infection  . Gabapentin Swelling  . Norvasc [Amlodipine Besylate] Swelling    TO FEET  . Lisinopril Cough    Current Outpatient Prescriptions  Medication Sig Dispense Refill  . acetaminophen (TYLENOL) 500 MG tablet Take 1,000 mg by mouth every 6 (six) hours as needed for moderate pain.     . Ascorbic Acid (VITAMIN C) 1000 MG tablet Take 1,000 mg by mouth daily.    . benzonatate (TESSALON) 100 MG capsule Take 1 capsule (100 mg total) by mouth 2 (two) times daily as needed for cough. 20 capsule 0  . CRANBERRY PO Take 1 capsule by mouth 2 (two) times daily.    . hydrochlorothiazide (HYDRODIURIL) 25 MG tablet Take 1 tablet (25 mg total) by mouth daily. 90 tablet 1  . lidocaine-prilocaine (EMLA) cream Apply 1 application topically as needed. Apply cream to port 1-2 hours prior to chemotherapy appointment. Cover with plastic wrap. 30 g 2  . loratadine (CLARITIN) 10 MG tablet Take 10 mg by mouth daily as needed for allergies.    Marland Kitchen losartan (COZAAR) 50 MG tablet Take 1 tablet (50 mg total) by mouth daily. (  Patient taking differently: Take 50 mg by mouth every morning. ) 90 tablet 0  . Probiotic Product (PROBIOTIC PO) Take 1 tablet by mouth daily.    Marland Kitchen triamcinolone (KENALOG) 0.025 % ointment Apply 1 application topically 2 (two) times daily. 30 g 0   No current facility-administered medications for this visit.     Review of Systems Review of Systems  Constitutional: Negative.   Respiratory: Negative.   Cardiovascular: Negative.     Blood pressure 132/68, pulse 76, resp. rate 12, height 5\' 7"  (1.702 m), weight 166 lb (75.3 kg).  Physical Exam Physical Exam  Constitutional: She is oriented to person, place, and time. She appears well-developed and well-nourished.  Eyes: Conjunctivae are normal. No scleral icterus.  Neck: Neck supple.   Pulmonary/Chest: Left breast exhibits tenderness.    Well healed incision-post left breast  Neurological: She is alert and oriented to person, place, and time.  Skin: Skin is warm and dry.    Data Reviewed 07/14/2016 pathology: T2, N0 s/p neo adjuvant therapy. Margins clear. Nodes (3 sentinel, 3 non-sentinel) negative. (No evidence of fibrous scarring in the lymph glands to suggest treated disease.)  Ultrasound examination of the surgical site was undertaken to determine if the patient would be a candidate for accelerated partial breast radiation. At the wide excision site there is a 1.5 x 1.97 x 2.93 cm cavity approximately 1.6 cm below the skin. There is a very small fluid collection in the left axilla measuring 1 cm. Adequate spacing for MammoSite placement if indicated.  Assessment    Doing well status post partial mastectomy and sentinel node biopsy for a T2, N0 carcinoma of the left breast.    Plan    The patient had a follow-up appointment scheduled with Dr. Grayland Tate for today. Arrangements have been made for the patient to be evaluated by Dr. Baruch Tate as well at this time regarding radiation treatment.  The patient has been encouraged to wear a bra until the breast no longer feels heavy.  Follow-up in 2 weeks, earlier based on radiation oncology assessment.      HPI, Physical Exam, Assessment and Plan have been scribed under the direction and in the presence of Catherine Ard, MD.  Catherine Tate, CMA I have completed the exam and reviewed the above documentation for accuracy and completeness.  I agree with the above.  Haematologist has been used and any errors in dictation or transcription are unintentional.  Catherine Tate, M.D., F.A.C.S.    Catherine Tate 07/21/2016, 12:08 PM

## 2016-07-21 NOTE — Progress Notes (Signed)
Here for follow up

## 2016-07-21 NOTE — Consult Note (Signed)
NEW PATIENT EVALUATION  Name: Catherine Tate  MRN: [id number]  Date:   07/21/2016     DOB: [birth year]   This 76 y.o. female patient presents to the clinic for initial evaluation of stage II a (T2 N0 M0) invasive mammary carcinoma ER/PR positive HER-2/neu negative of the upper outer quadrant of the left breast status post neoadjuvant chemotherapy than wide local excision and sentinel node biopsy.  REFERRING PHYSICIAN: [name], NP  CHIEF COMPLAINT: No chief complaint on file.   DIAGNOSIS: The encounter diagnosis was Malignant neoplasm of upper-outer quadrant of left breast in female, estrogen receptor positive ([place]).   PREVIOUS INVESTIGATIONS:  Pathology reports reviewed Mammogram and ultrasound reviewed Clinical notes reviewed  HPI: Patient is a 76 year old female who presented with self discovered pain and discomfort in her left breast prompting a visit to her family doctor. This prompt mammogram and ultrasound confirming a dominant mass at the 1:30 position 8 cm from the nipple in the upper outer quadrant of the right breast measuring 2 x 2.1 cm. This was also confirmed on ultrasound. Lymph nodes were unremarkable at the time. Biopsy was performed showing ER/PR positive HER-2/neu negative invasive mammary carcinoma Overall grade 2. She underwent neoadjuvant chemotherapy with Cytoxan and Taxotere for 4 cycles. She tolerated that well. 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 6 lymph nodes were removed 3 of them Sentinel all negative for metastatic disease. Margins were clear at 5 mm. She has done well postoperatively although is still somewhat somewhat sensitive in that area and according to ultrasound has a seroma present. She is now referred to radiation oncology for opinion.  PLANNED TREATMENT REGIMEN: Whole breast radiation   PAST MEDICAL HISTORY:  has a past medical history of Arthritis; Cancer ([place]) (03/13/2016);  Chickenpox; Complication of anesthesia; Frequent UTI; GERD (gastroesophageal reflux disease); Hypertension; PONV (postoperative nausea and vomiting); RSD (reflex sympathetic dystrophy); Seasonal allergies; Skin cancer; and Urine incontinence.    PAST SURGICAL HISTORY:  Past Surgical History:  Procedure Laterality Date  . ABDOMINAL HYSTERECTOMY  2005   total/ Dr Davis Gourd  . BLADDER TUMOR EXCISION  2006  . BREAST BIOPSY Left 03/13/2016   INVASIVE MAMMARY CARCINOMA  . BREAST LUMPECTOMY WITH SENTINEL LYMPH NODE BIOPSY Left 07/14/2016   Procedure: BREAST LUMPECTOMY WITH SENTINEL LYMPH NODE BX;  Surgeon: [name], MD;  Location: [facility] ORS;  Service: General;  Laterality: Left;  . PORTACATH PLACEMENT N/A 04/01/2016   Procedure: INSERTION PORT-A-CATH;  Surgeon: [name], MD;  Location: [facility] ORS;  Service: General;  Laterality: N/A;  . SKIN CANCER EXCISION Right 2015   foot/ Dr [place] Crane  . TUBAL LIGATION    . TUMOR EXCISION Right    FOOT    FAMILY HISTORY: family history includes Breast cancer in her cousin; Emphysema in her father; Heart disease in her mother; Transient ischemic attack in her mother.  SOCIAL HISTORY:  reports that she quit smoking about 22 years ago. Her smoking use included Cigarettes. She has a 20.00 pack-year smoking history. She has never used smokeless tobacco. She reports that she does not drink alcohol or use drugs.  ALLERGIES: Bee pollen; Mosquito (diagnostic); Other; Gabapentin; Norvasc [amlodipine besylate]; and Lisinopril  MEDICATIONS:  Current Outpatient Prescriptions  Medication Sig Dispense Refill  . acetaminophen (TYLENOL) 500 MG tablet Take 1,000 mg by mouth every 6 (six) hours as needed for moderate pain.     . Ascorbic Acid (VITAMIN C) 1000  MG tablet Take 1,000 mg by mouth daily.    Marland Kitchen CRANBERRY PO Take 1 capsule by mouth 2 (two) times daily.    . hydrochlorothiazide (HYDRODIURIL) 25 MG tablet Take 1 tablet (25 mg total) by mouth daily. 90  tablet 1  . lidocaine-prilocaine (EMLA) cream Apply 1 application topically as needed. Apply cream to port 1-2 hours prior to chemotherapy appointment. Cover with plastic wrap. (Patient not taking: Reported on 07/21/2016) 30 g 2  . loratadine (CLARITIN) 10 MG tablet Take 10 mg by mouth daily as needed for allergies.    Marland Kitchen losartan (COZAAR) 50 MG tablet TAKE 1 TABLET BY MOUTH ONCE A DAY 90 tablet 1  . Probiotic Product (PROBIOTIC PO) Take 1 tablet by mouth daily.    Marland Kitchen triamcinolone (KENALOG) 0.025 % ointment Apply 1 application topically 2 (two) times daily. (Patient not taking: Reported on 07/21/2016) 30 g 0   No current facility-administered medications for this encounter.     ECOG PERFORMANCE STATUS:  0 - Asymptomatic  REVIEW OF SYSTEMS:  Patient denies any weight loss, fatigue, weakness, fever, chills or night sweats. Patient denies any loss of vision, blurred vision. Patient denies any ringing  of the ears or hearing loss. No irregular heartbeat. Patient denies heart murmur or history of fainting. Patient denies any chest pain or pain radiating to her upper extremities. Patient denies any shortness of breath, difficulty breathing at night, cough or hemoptysis. Patient denies any swelling in the lower legs. Patient denies any nausea vomiting, vomiting of blood, or coffee ground material in the vomitus. Patient denies any stomach pain. Patient states has had normal bowel movements no significant constipation or diarrhea. Patient denies any dysuria, hematuria or significant nocturia. Patient denies any problems walking, swelling in the joints or loss of balance. Patient denies any skin changes, loss of hair or loss of weight. Patient denies any excessive worrying or anxiety or significant depression. Patient denies any problems with insomnia. Patient denies excessive thirst, polyuria, polydipsia. Patient denies any swollen glands, patient denies easy bruising or easy bleeding. Patient denies any recent  infections, allergies or URI. Patient "s visual fields have not changed significantly in recent time.    PHYSICAL EXAM: There were no vitals taken for this visit. Patient is status post wide local excision of left breast with incision is healing well. She is somewhat sensitive on examination although no dominant mass or nodularity is noted in either breast in 2 positions examined. No axillary or supraclavicular adenopathy is appreciated. Well-developed well-nourished patient in NAD. HEENT reveals PERLA, EOMI, discs not visualized.  Oral cavity is clear. No oral mucosal lesions are identified. Neck is clear without evidence of cervical or supraclavicular adenopathy. Lungs are clear to A&P. Cardiac examination is essentially unremarkable with regular rate and rhythm without murmur rub or thrill. Abdomen is benign with no organomegaly or masses noted. Motor sensory and DTR levels are equal and symmetric in the upper and lower extremities. Cranial nerves II through XII are grossly intact. Proprioception is intact. No peripheral adenopathy or edema is identified. No motor or sensory levels are noted. Crude visual fields are within normal range.  LABORATORY DATA: Pathology reports reviewed   RADIOLOGY RESULTS:Mammogram and ultrasound reviewed  IMPRESSION: Stage IIa invasive mammary carcinoma of the left breast status post neoadjuvant chemotherapy followed by wide local excision and sentinel node biopsy in 76 year old female.   PLAN: At this time I would recommend whole breast radiation. Patient has rather large breast which would make hypofractionated course  of treatment difficult. Would plan on delivering 5040 cGy in 28 fractions to her left breast boosting her scar another 1400 cGy using electron beam. Risks and benefits of treatment including skin reaction fatigue alteration of blood counts possible inclusion of some superficial lung all were discussed in detail with the patient. I have personally set up  and ordered CT simulation in about 2 weeks to allow further healing and some of the reduction of the seroma. I discussed the case with the surgeon. Accelerated partial breast radiation is a contraindication to patients who have received neoadjuvant chemotherapy.  I would like to take this opportunity to thank you for allowing me to participate in the care of your patient.Armstead Peaks., MD

## 2016-07-21 NOTE — Patient Instructions (Addendum)
Use a sports bra and a heating pad to encourage healing. Continue using Aleve and Tylenol as needed.   Will follow up in 2 weeks.

## 2016-08-04 ENCOUNTER — Ambulatory Visit
Admission: RE | Admit: 2016-08-04 | Discharge: 2016-08-04 | Disposition: A | Discharge status: Home / Self Care | Payer: PPO | Source: Ambulatory Visit | Attending: Radiation Oncology | Admitting: Radiation Oncology

## 2016-08-04 ENCOUNTER — Ambulatory Visit: Payer: PPO

## 2016-08-04 ENCOUNTER — Ambulatory Visit (INDEPENDENT_AMBULATORY_CARE_PROVIDER_SITE_OTHER): Payer: PPO | Admitting: General Surgery

## 2016-08-04 ENCOUNTER — Encounter: Payer: Self-pay | Admitting: General Surgery

## 2016-08-04 VITALS — BP 140/82 | HR 82 | Resp 14 | Ht 67.0 in | Wt 161.0 lb

## 2016-08-04 DIAGNOSIS — K219 Gastro-esophageal reflux disease without esophagitis: Secondary | ICD-10-CM | POA: Diagnosis not present

## 2016-08-04 DIAGNOSIS — C50412 Malignant neoplasm of upper-outer quadrant of left female breast: Secondary | ICD-10-CM

## 2016-08-04 DIAGNOSIS — Z85828 Personal history of other malignant neoplasm of skin: Secondary | ICD-10-CM | POA: Diagnosis not present

## 2016-08-04 DIAGNOSIS — Z87440 Personal history of urinary (tract) infections: Secondary | ICD-10-CM | POA: Diagnosis not present

## 2016-08-04 DIAGNOSIS — Z170 Estrogen receptor positive status [ER+]: Secondary | ICD-10-CM

## 2016-08-04 DIAGNOSIS — R32 Unspecified urinary incontinence: Secondary | ICD-10-CM | POA: Diagnosis not present

## 2016-08-04 DIAGNOSIS — Z79899 Other long term (current) drug therapy: Secondary | ICD-10-CM | POA: Diagnosis not present

## 2016-08-04 DIAGNOSIS — Z803 Family history of malignant neoplasm of breast: Secondary | ICD-10-CM | POA: Diagnosis not present

## 2016-08-04 DIAGNOSIS — F17210 Nicotine dependence, cigarettes, uncomplicated: Secondary | ICD-10-CM | POA: Diagnosis not present

## 2016-08-04 DIAGNOSIS — Z510 Encounter for antineoplastic radiation therapy: Secondary | ICD-10-CM | POA: Diagnosis not present

## 2016-08-04 DIAGNOSIS — G9050 Complex regional pain syndrome I, unspecified: Secondary | ICD-10-CM | POA: Diagnosis not present

## 2016-08-04 DIAGNOSIS — M129 Arthropathy, unspecified: Secondary | ICD-10-CM | POA: Diagnosis not present

## 2016-08-04 NOTE — Patient Instructions (Addendum)
Continue to use heat and wear a bra at night. Follow up in 2 months. May use Aleve one, twice daily or Ibuprofen/Motrin three, three times a day as needed.

## 2016-08-04 NOTE — Progress Notes (Signed)
Patient ID: Catherine Tate, female   DOB: 1941-02-03, 76 y.o.   MRN: [id number]  Chief Complaint  Patient presents with  . Routine Post Op    HPI Catherine Tate is a 76 y.o. female here for follow up from her left breast lumpectomy done on 07/14/16. She reports that she is still very sore and tender. She reports that the bra is rubbing the areas and making it tender. She has been making use of over the counter pain medications. She has been padding her bra and wearing it at night. She reports some swelling in her feet at night, her PCP is adjusting her blood pressure medications.   Scheduled for simulation today with radiation oncology.  HPI  Past Medical History:  Diagnosis Date  . Arthritis   . Cancer ([place]) 03/13/2016   INVASIVE MAMMARY CARCINOMA  . Chickenpox   . Complication of anesthesia   . Frequent UTI   . GERD (gastroesophageal reflux disease)    RARE  . Hypertension   . PONV (postoperative nausea and vomiting)   . RSD (reflex sympathetic dystrophy)   . Seasonal allergies   . Skin cancer   . Urine incontinence     Past Surgical History:  Procedure Laterality Date  . ABDOMINAL HYSTERECTOMY  2005   total/ Dr Davis Gourd  . BLADDER TUMOR EXCISION  2006  . BREAST BIOPSY Left 03/13/2016   INVASIVE MAMMARY CARCINOMA  . BREAST LUMPECTOMY WITH SENTINEL LYMPH NODE BIOPSY Left 07/14/2016   Procedure: BREAST LUMPECTOMY WITH SENTINEL LYMPH NODE BX;  Surgeon: [name], MD;  Location: [facility] ORS;  Service: General;  Laterality: Left;  . PORTACATH PLACEMENT N/A 04/01/2016   Procedure: INSERTION PORT-A-CATH;  Surgeon: [name], MD;  Location: [facility] ORS;  Service: General;  Laterality: N/A;  . SKIN CANCER EXCISION Right 2015   foot/ Dr [place] Cr[place]  . TUBAL LIGATION    . TUMOR EXCISION Right    FOOT    Family History  Problem Relation Age of Onset  . Emphysema Father   . Heart disease Mother   . Transient ischemic attack Mother   . Breast cancer Cousin     paternal     Social History Social History  Substance Use Topics  . Smoking status: Former Smoker    Packs/day: 1.00    Years: 20.00    Types: Cigarettes    Quit date: 04/14/1994  . Smokeless tobacco: Never Used  . Alcohol use No    Allergies  Allergen Reactions  . Bee Pollen Hives  . Mosquito (Diagnostic) Hives  . Other Other (See Comments)    Uncoded Allergy. Allergen: VICRYL  (90% glycolide and 10% L-lactide [polyglactin-- synthetic absorbable sterile surgical suture]), Other Reaction: Infection  . Gabapentin Swelling  . Norvasc [Amlodipine Besylate] Swelling    TO FEET  . Lisinopril Cough    Current Outpatient Prescriptions  Medication Sig Dispense Refill  . acetaminophen (TYLENOL) 500 MG tablet Take 1,000 mg by mouth every 6 (six) hours as needed for moderate pain.     . Ascorbic Acid (VITAMIN C) 1000 MG tablet Take 1,000 mg by mouth daily.    Marland Kitchen CRANBERRY PO Take 1 capsule by mouth 2 (two) times daily.    . hydrochlorothiazide (HYDRODIURIL) 25 MG tablet Take 1 tablet (25 mg total) by mouth daily. 90 tablet 1  . losartan (COZAAR) 50 MG tablet TAKE 1 TABLET BY MOUTH ONCE A DAY 90 tablet 1  . Probiotic Product (PROBIOTIC PO) Take 1  tablet by mouth daily.    Marland Kitchen triamcinolone (KENALOG) 0.025 % ointment Apply 1 application topically 2 (two) times daily. 30 g 0   No current facility-administered medications for this visit.     Review of Systems Review of Systems  Constitutional: Negative.   Respiratory: Negative.   Cardiovascular: Negative.     Blood pressure 140/82, pulse 82, resp. rate 14, height 5\' 7"  (1.702 m), weight 161 lb (73 kg).  Physical Exam Physical Exam  Constitutional: She is oriented to person, place, and time. She appears well-developed and well-nourished.  Pulmonary/Chest:    Neurological: She is alert and oriented to person, place, and time.  Skin: Skin is warm and dry.    Data Reviewed Radiation oncology notes reviewed.  Assessment    Doing well  status post wide excision and sentinel node biopsy.    Plan    The patient will continue to use Aleve twice a day and Tylenol as needed for comfort. She'll continue to use her bra day and night until the soreness resolves.  She was not a candidate for accelerated partial breast radiation based on her history of neoadjuvant chemotherapy.    HPI, Physical Exam, Assessment and Plan have been scribed under the direction and in the presence of [name], MD  Concepcion Living, LPN   I have completed the exam and reviewed the above documentation for accuracy and completeness.  I agree with the above.  Haematologist has been used and any errors in dictation or transcription are unintentional.  Hervey Ard, M.D., F.A.C.S. Jacklynn Lewis W 08/04/2016, 10:13 AM

## 2016-08-05 DIAGNOSIS — C50412 Malignant neoplasm of upper-outer quadrant of left female breast: Secondary | ICD-10-CM | POA: Diagnosis not present

## 2016-08-05 DIAGNOSIS — Z510 Encounter for antineoplastic radiation therapy: Secondary | ICD-10-CM | POA: Diagnosis not present

## 2016-08-05 DIAGNOSIS — Z170 Estrogen receptor positive status [ER+]: Secondary | ICD-10-CM | POA: Diagnosis not present

## 2016-08-08 ENCOUNTER — Other Ambulatory Visit: Payer: Self-pay | Admitting: *Deleted

## 2016-08-08 DIAGNOSIS — Z170 Estrogen receptor positive status [ER+]: Principal | ICD-10-CM

## 2016-08-08 DIAGNOSIS — C50412 Malignant neoplasm of upper-outer quadrant of left female breast: Secondary | ICD-10-CM

## 2016-08-11 ENCOUNTER — Ambulatory Visit: Payer: PPO

## 2016-08-11 ENCOUNTER — Ambulatory Visit
Admission: RE | Admit: 2016-08-11 | Discharge: 2016-08-11 | Disposition: A | Discharge status: Home / Self Care | Payer: PPO | Source: Ambulatory Visit | Attending: Radiation Oncology | Admitting: Radiation Oncology

## 2016-08-11 DIAGNOSIS — Z510 Encounter for antineoplastic radiation therapy: Secondary | ICD-10-CM | POA: Diagnosis not present

## 2016-08-11 DIAGNOSIS — C50412 Malignant neoplasm of upper-outer quadrant of left female breast: Secondary | ICD-10-CM | POA: Diagnosis not present

## 2016-08-11 DIAGNOSIS — Z170 Estrogen receptor positive status [ER+]: Secondary | ICD-10-CM | POA: Diagnosis not present

## 2016-08-12 ENCOUNTER — Ambulatory Visit
Admission: RE | Admit: 2016-08-12 | Discharge: 2016-08-12 | Disposition: A | Discharge status: Home / Self Care | Payer: PPO | Source: Ambulatory Visit | Attending: Radiation Oncology | Admitting: Radiation Oncology

## 2016-08-12 DIAGNOSIS — Z510 Encounter for antineoplastic radiation therapy: Secondary | ICD-10-CM | POA: Diagnosis not present

## 2016-08-13 ENCOUNTER — Ambulatory Visit
Admission: RE | Admit: 2016-08-13 | Discharge: 2016-08-13 | Disposition: A | Discharge status: Home / Self Care | Payer: PPO | Source: Ambulatory Visit | Attending: Radiation Oncology | Admitting: Radiation Oncology

## 2016-08-13 ENCOUNTER — Ambulatory Visit: Payer: PPO

## 2016-08-13 DIAGNOSIS — Z510 Encounter for antineoplastic radiation therapy: Secondary | ICD-10-CM | POA: Diagnosis not present

## 2016-08-14 ENCOUNTER — Ambulatory Visit
Admission: RE | Admit: 2016-08-14 | Discharge: 2016-08-14 | Disposition: A | Discharge status: Home / Self Care | Payer: PPO | Source: Ambulatory Visit | Attending: Radiation Oncology | Admitting: Radiation Oncology

## 2016-08-14 DIAGNOSIS — Z510 Encounter for antineoplastic radiation therapy: Secondary | ICD-10-CM | POA: Diagnosis not present

## 2016-08-15 ENCOUNTER — Ambulatory Visit
Admission: RE | Admit: 2016-08-15 | Discharge: 2016-08-15 | Disposition: A | Discharge status: Home / Self Care | Payer: PPO | Source: Ambulatory Visit | Attending: Radiation Oncology | Admitting: Radiation Oncology

## 2016-08-15 DIAGNOSIS — Z510 Encounter for antineoplastic radiation therapy: Secondary | ICD-10-CM | POA: Diagnosis not present

## 2016-08-18 ENCOUNTER — Ambulatory Visit
Admission: RE | Admit: 2016-08-18 | Discharge: 2016-08-18 | Disposition: A | Discharge status: Home / Self Care | Payer: PPO | Source: Ambulatory Visit | Attending: Radiation Oncology | Admitting: Radiation Oncology

## 2016-08-18 ENCOUNTER — Inpatient Hospital Stay: Payer: PPO | Attending: Oncology

## 2016-08-18 DIAGNOSIS — C50412 Malignant neoplasm of upper-outer quadrant of left female breast: Secondary | ICD-10-CM | POA: Diagnosis not present

## 2016-08-18 DIAGNOSIS — Z170 Estrogen receptor positive status [ER+]: Secondary | ICD-10-CM | POA: Insufficient documentation

## 2016-08-18 DIAGNOSIS — Z95828 Presence of other vascular implants and grafts: Secondary | ICD-10-CM

## 2016-08-18 DIAGNOSIS — Z510 Encounter for antineoplastic radiation therapy: Secondary | ICD-10-CM | POA: Diagnosis not present

## 2016-08-18 LAB — COMPREHENSIVE METABOLIC PANEL
ALK PHOS: 76 U/L (ref 38–126)
ALT: 23 U/L (ref 14–54)
AST: 24 U/L (ref 15–41)
Albumin: 4.1 g/dL (ref 3.5–5.0)
Anion gap: 6 (ref 5–15)
BUN: 29 mg/dL — ABNORMAL HIGH (ref 6–20)
CALCIUM: 9.2 mg/dL (ref 8.9–10.3)
CO2: 25 mmol/L (ref 22–32)
CREATININE: 0.84 mg/dL (ref 0.44–1.00)
Chloride: 105 mmol/L (ref 101–111)
GFR calc non Af Amer: >60 mL/min (ref >60)
GLUCOSE: 118 mg/dL — AB (ref 65–99)
Potassium: 4.6 mmol/L (ref 3.5–5.1)
SODIUM: 136 mmol/L (ref 135–145)
Total Bilirubin: 0.5 mg/dL (ref 0.3–1.2)
Total Protein: 7.7 g/dL (ref 6.5–8.1)

## 2016-08-18 LAB — CBC WITH DIFFERENTIAL/PLATELET
Basophils Absolute: 0 10*3/uL (ref 0–0.1)
Basophils Relative: 0 %
EOS ABS: 0.1 10*3/uL (ref 0–0.7)
Eosinophils Relative: 2 %
HEMATOCRIT: 33.8 % — AB (ref 35.0–47.0)
HEMOGLOBIN: 11.8 g/dL — AB (ref 12.0–16.0)
LYMPHS ABS: 2 10*3/uL (ref 1.0–3.6)
LYMPHS PCT: 31 %
MCH: 32.6 pg (ref 26.0–34.0)
MCHC: 35 g/dL (ref 32.0–36.0)
MCV: 93.1 fL (ref 80.0–100.0)
Monocytes Absolute: 0.4 10*3/uL (ref 0.2–0.9)
Monocytes Relative: 7 %
NEUTROS ABS: 3.8 10*3/uL (ref 1.4–6.5)
NEUTROS PCT: 60 %
Platelets: 222 10*3/uL (ref 150–440)
RBC: 3.63 MIL/uL — AB (ref 3.80–5.20)
RDW: 12.5 % (ref 11.5–14.5)
WBC: 6.4 10*3/uL (ref 3.6–11.0)

## 2016-08-18 MED ORDER — SODIUM CHLORIDE 0.9% FLUSH
10.0000 mL | INTRAVENOUS | Status: DC | PRN
  Administered 2016-08-18: 10 mL via INTRAVENOUS
  Filled 2016-08-18: qty 10

## 2016-08-18 MED ORDER — HEPARIN SOD (PORK) LOCK FLUSH 100 UNIT/ML IV SOLN
500.0000 [IU] | Freq: Once | INTRAVENOUS | Status: AC
  Administered 2016-08-18: 500 [IU] via INTRAVENOUS

## 2016-08-19 ENCOUNTER — Ambulatory Visit
Admission: RE | Admit: 2016-08-19 | Discharge: 2016-08-19 | Disposition: A | Discharge status: Home / Self Care | Payer: PPO | Source: Ambulatory Visit | Attending: Radiation Oncology | Admitting: Radiation Oncology

## 2016-08-19 DIAGNOSIS — Z510 Encounter for antineoplastic radiation therapy: Secondary | ICD-10-CM | POA: Diagnosis not present

## 2016-08-20 ENCOUNTER — Ambulatory Visit
Admission: RE | Admit: 2016-08-20 | Discharge: 2016-08-20 | Disposition: A | Discharge status: Home / Self Care | Payer: PPO | Source: Ambulatory Visit | Attending: Radiation Oncology | Admitting: Radiation Oncology

## 2016-08-20 DIAGNOSIS — Z510 Encounter for antineoplastic radiation therapy: Secondary | ICD-10-CM | POA: Diagnosis not present

## 2016-08-21 ENCOUNTER — Ambulatory Visit
Admission: RE | Admit: 2016-08-21 | Discharge: 2016-08-21 | Disposition: A | Discharge status: Home / Self Care | Payer: PPO | Source: Ambulatory Visit | Attending: Radiation Oncology | Admitting: Radiation Oncology

## 2016-08-21 ENCOUNTER — Inpatient Hospital Stay: Payer: PPO

## 2016-08-21 DIAGNOSIS — Z510 Encounter for antineoplastic radiation therapy: Secondary | ICD-10-CM | POA: Diagnosis not present

## 2016-08-22 ENCOUNTER — Ambulatory Visit
Admission: RE | Admit: 2016-08-22 | Discharge: 2016-08-22 | Disposition: A | Discharge status: Home / Self Care | Payer: PPO | Source: Ambulatory Visit | Attending: Radiation Oncology | Admitting: Radiation Oncology

## 2016-08-22 DIAGNOSIS — Z510 Encounter for antineoplastic radiation therapy: Secondary | ICD-10-CM | POA: Diagnosis not present

## 2016-08-25 ENCOUNTER — Ambulatory Visit
Admission: RE | Admit: 2016-08-25 | Discharge: 2016-08-25 | Disposition: A | Discharge status: Home / Self Care | Payer: PPO | Source: Ambulatory Visit | Attending: Radiation Oncology | Admitting: Radiation Oncology

## 2016-08-25 DIAGNOSIS — Z510 Encounter for antineoplastic radiation therapy: Secondary | ICD-10-CM | POA: Diagnosis not present

## 2016-08-25 DIAGNOSIS — Z170 Estrogen receptor positive status [ER+]: Secondary | ICD-10-CM | POA: Diagnosis not present

## 2016-08-25 DIAGNOSIS — C50412 Malignant neoplasm of upper-outer quadrant of left female breast: Secondary | ICD-10-CM | POA: Diagnosis not present

## 2016-08-25 NOTE — Progress Notes (Signed)
08/28/2016 4:44 PM   Catherine Tate 18-Dec-1940 [id number]  Referring provider: [name], NP [address] [place] [place], [place] [address]  Chief Complaint  Patient presents with  . Urinary Incontinence    Mixed  3 month follow up     HPI: 76 yo WF who presents today for a 3 month follow up for mixed incontinence and meatal stenosis.  Mixed urinary incontinence She is not taking the oxybutynin at this time.  She is wanting to wait until she completed chemotherapy, surgery and radiation.  The patient has been experiencing urgency x 0, frequency x 0-3, not restricting fluids to avoid visits to the restroom, is engaging in toilet mapping, incontinence x 8 or more and nocturia x 4-7.  Her PVR is 12 mL.  She states that she has started having dysuria a couple days ago.  She denies gross hematuria and suprapubic pain.  She has not had fevers, chills, nausea or vomiting.  She is soaking through her pads at night.   Her UA today is positive for greater than 30 WBCs and many bacteria.  Meatal stenosis Attempts at cathing and dilation failed at her visit three months ago.  She is urinating without difficulty.   PMH: Past Medical History:  Diagnosis Date  . Arthritis   . Cancer ([place]) 03/13/2016   INVASIVE MAMMARY CARCINOMA  . Chickenpox   . Complication of anesthesia   . Frequent UTI   . GERD (gastroesophageal reflux disease)    RARE  . Hypertension   . PONV (postoperative nausea and vomiting)   . RSD (reflex sympathetic dystrophy)   . Seasonal allergies   . Skin cancer   . Urine incontinence     Surgical History: Past Surgical History:  Procedure Laterality Date  . ABDOMINAL HYSTERECTOMY  2005   total/ Dr Davis Gourd  . BLADDER TUMOR EXCISION  2006  . BREAST BIOPSY Left 03/13/2016   INVASIVE MAMMARY CARCINOMA  . BREAST LUMPECTOMY WITH SENTINEL LYMPH NODE BIOPSY Left 07/14/2016   Procedure: BREAST LUMPECTOMY WITH SENTINEL LYMPH NODE BX;  Surgeon: [name], MD;   Location: [facility] ORS;  Service: General;  Laterality: Left;  . denture surgery    . PORTACATH PLACEMENT N/A 04/01/2016   Procedure: INSERTION PORT-A-CATH;  Surgeon: [name], MD;  Location: [facility] ORS;  Service: General;  Laterality: N/A;  . SKIN CANCER EXCISION Right 2015   foot/ Dr [place] Crane  . TUBAL LIGATION    . TUMOR EXCISION Right    FOOT    Home Medications:  Allergies as of 08/28/2016      Reactions   Bee Pollen Hives   Mosquito (diagnostic) Hives   Other Other (See Comments)   Uncoded Allergy. Allergen: VICRYL  (90% glycolide and 10% L-lactide [polyglactin-- synthetic absorbable sterile surgical suture]), Other Reaction: Infection   Gabapentin Swelling   Norvasc [amlodipine Besylate] Swelling   TO FEET   Lisinopril Cough      Medication List       Accurate as of 08/28/16  4:44 PM. Always use your most recent med list.          acetaminophen 500 MG tablet Commonly known as:  TYLENOL Take 1,000 mg by mouth every 6 (six) hours as needed for moderate pain.   amoxicillin-clavulanate 875-125 MG tablet Commonly known as:  AUGMENTIN Take 1 tablet by mouth every 12 (twelve) hours.   CRANBERRY PO Take 1 capsule by mouth 2 (two) times daily.   hydrochlorothiazide  25 MG tablet Commonly known as:  HYDRODIURIL Take 1 tablet (25 mg total) by mouth daily.   ibuprofen 200 MG tablet Commonly known as:  ADVIL,MOTRIN Take 200 mg by mouth every 6 (six) hours as needed.   losartan 50 MG tablet Commonly known as:  COZAAR TAKE 1 TABLET BY MOUTH ONCE A DAY   PROBIOTIC PO Take 1 tablet by mouth daily.   triamcinolone 0.025 % ointment Commonly known as:  KENALOG Apply 1 application topically 2 (two) times daily.   vitamin C 1000 MG tablet Take 1,000 mg by mouth daily.       Allergies:  Allergies  Allergen Reactions  . Bee Pollen Hives  . Mosquito (Diagnostic) Hives  . Other Other (See Comments)    Uncoded Allergy. Allergen: VICRYL  (90% glycolide and 10%  L-lactide [polyglactin-- synthetic absorbable sterile surgical suture]), Other Reaction: Infection  . Gabapentin Swelling  . Norvasc [Amlodipine Besylate] Swelling    TO FEET  . Lisinopril Cough    Family History: Family History  Problem Relation Age of Onset  . Emphysema Father   . Heart disease Mother   . Transient ischemic attack Mother   . Breast cancer Cousin        paternal  . Kidney cancer Neg Hx   . Bladder Cancer Neg Hx     Social History:  reports that she quit smoking about 22 years ago. Her smoking use included Cigarettes. She has a 20.00 pack-year smoking history. She has never used smokeless tobacco. She reports that she does not drink alcohol or use drugs.  ROS: UROLOGY Frequent Urination?: Yes Hard to postpone urination?: No Burning/pain with urination?: Yes Get up at night to urinate?: No Leakage of urine?: Yes Urine stream starts and stops?: No Trouble starting stream?: No Do you have to strain to urinate?: No Blood in urine?: No Urinary tract infection?: Yes Sexually transmitted disease?: No Injury to kidneys or bladder?: No Painful intercourse?: No Weak stream?: No Currently pregnant?: No Vaginal bleeding?: No Last menstrual period?: n  Gastrointestinal Nausea?: No Vomiting?: No Indigestion/heartburn?: No Diarrhea?: No Constipation?: No  Constitutional Fever: No Night sweats?: No Weight loss?: No Fatigue?: No  Skin Skin rash/lesions?: No Itching?: No  Eyes Blurred vision?: No Double vision?: No  Ears/Nose/Throat Sore throat?: No Sinus problems?: Yes  Hematologic/Lymphatic Swollen glands?: Yes Easy bruising?: No  Cardiovascular Leg swelling?: No Chest pain?: No  Respiratory Cough?: No Shortness of breath?: No  Endocrine Excessive thirst?: No  Musculoskeletal Back pain?: Yes Joint pain?: No  Neurological Headaches?: No Dizziness?: No  Psychologic Depression?: No Anxiety?: No  Physical Exam: BP (!) 151/83    Pulse (!) 102   Ht 5\' 7"  (1.702 m)   Wt 162 lb (73.5 kg)   BMI 25.37 kg/m   Constitutional: Well nourished. Alert and oriented, No acute distress. HEENT: [place] AT, moist mucus membranes. Trachea midline, no masses. Cardiovascular: No clubbing, cyanosis, or edema. Respiratory: Normal respiratory effort, no increased work of breathing. GI: Abdomen is soft, non tender, non distended, no abdominal masses. Liver and spleen not palpable.  No hernias appreciated.  Stool sample for occult testing is not indicated.   GU: No CVA tenderness.  No bladder fullness or masses.   Skin: No rashes, bruises or suspicious lesions. Lymph: No cervical or inguinal adenopathy. Neurologic: Grossly intact, no focal deficits, moving all 4 extremities. Psychiatric: Normal mood and affect.  Laboratory Data: Lab Results  Component Value Date   WBC 6.4 08/18/2016  HGB 11.8 (L) 08/18/2016   HCT 33.8 (L) 08/18/2016   MCV 93.1 08/18/2016   PLT 222 08/18/2016    Lab Results  Component Value Date   CREATININE 0.84 08/18/2016     Lab Results  Component Value Date   HGBA1C 5.7 10/29/2015       Component Value Date/Time   CHOL 221 (H) 10/29/2015 0750   HDL 44.10 10/29/2015 0750   CHOLHDL 5 10/29/2015 0750   VLDL 24.4 10/29/2015 0750   LDLCALC 153 (H) 10/29/2015 0750    Lab Results  Component Value Date   AST 24 08/18/2016   Lab Results  Component Value Date   ALT 23 08/18/2016    Pertinent Imaging: Results for Catherine, Tate (MRN [id number]) as of 08/28/2016 09:12  Ref. Range 08/28/2016 09:02  Scan Result Unknown 12     Assessment & Plan:     1. Mixed incontinence  - script sent in for oxybutynin 5 mg tid - patient does not want to start the medication until she completes treatment for breast cancer  2. Meatal stenosis  - patient is not a candidate for vaginal estrogen therapy at this time due to her breast cancer  - she may use olive and/or coconut oil  - she is to contact the office  if she cannot urinate or seek treatment in the ED   3. Dysuria  - UA suspicious for infection, will send for culture - start Augmentin empirically - will adjust if necessary once sensitivities are available     Return for pending urine culture.  These notes generated with voice recognition software. I apologize for typographical errors.  Zara Council, [facility] [address] [place], [place] [address] [phone]

## 2016-08-26 ENCOUNTER — Ambulatory Visit
Admission: RE | Admit: 2016-08-26 | Discharge: 2016-08-26 | Disposition: A | Discharge status: Home / Self Care | Payer: PPO | Source: Ambulatory Visit | Attending: Radiation Oncology | Admitting: Radiation Oncology

## 2016-08-26 DIAGNOSIS — Z510 Encounter for antineoplastic radiation therapy: Secondary | ICD-10-CM | POA: Diagnosis not present

## 2016-08-27 ENCOUNTER — Ambulatory Visit
Admission: RE | Admit: 2016-08-27 | Discharge: 2016-08-27 | Disposition: A | Discharge status: Home / Self Care | Payer: PPO | Source: Ambulatory Visit | Attending: Radiation Oncology | Admitting: Radiation Oncology

## 2016-08-27 DIAGNOSIS — Z510 Encounter for antineoplastic radiation therapy: Secondary | ICD-10-CM | POA: Diagnosis not present

## 2016-08-28 ENCOUNTER — Ambulatory Visit (INDEPENDENT_AMBULATORY_CARE_PROVIDER_SITE_OTHER): Payer: PPO | Admitting: Urology

## 2016-08-28 ENCOUNTER — Encounter: Payer: Self-pay | Admitting: Urology

## 2016-08-28 ENCOUNTER — Ambulatory Visit
Admission: RE | Admit: 2016-08-28 | Discharge: 2016-08-28 | Disposition: A | Discharge status: Home / Self Care | Payer: PPO | Source: Ambulatory Visit | Attending: Radiation Oncology | Admitting: Radiation Oncology

## 2016-08-28 VITALS — BP 151/83 | HR 102 | Ht 67.0 in | Wt 162.0 lb

## 2016-08-28 DIAGNOSIS — N359 Urethral stricture, unspecified: Secondary | ICD-10-CM

## 2016-08-28 DIAGNOSIS — R300 Dysuria: Secondary | ICD-10-CM

## 2016-08-28 DIAGNOSIS — N3946 Mixed incontinence: Secondary | ICD-10-CM | POA: Diagnosis not present

## 2016-08-28 DIAGNOSIS — Z510 Encounter for antineoplastic radiation therapy: Secondary | ICD-10-CM | POA: Diagnosis not present

## 2016-08-28 DIAGNOSIS — IMO0002 Reserved for concepts with insufficient information to code with codable children: Secondary | ICD-10-CM

## 2016-08-28 LAB — URINALYSIS, COMPLETE
BILIRUBIN UA: NEGATIVE
Glucose, UA: NEGATIVE
Ketones, UA: NEGATIVE
Nitrite, UA: NEGATIVE
PH UA: 5 (ref 5.0–7.5)
PROTEIN UA: NEGATIVE
Specific Gravity, UA: 1.015 (ref 1.005–1.030)
Urobilinogen, Ur: 0.2 mg/dL (ref 0.2–1.0)

## 2016-08-28 LAB — MICROSCOPIC EXAMINATION
RBC, UA: NONE SEEN /hpf (ref 0–?)
WBC, UA: >30 /hpf — ABNORMAL HIGH (ref 0–?)

## 2016-08-28 LAB — BLADDER SCAN AMB NON-IMAGING: Scan Result: 12

## 2016-08-28 MED ORDER — AMOXICILLIN-POT CLAVULANATE 875-125 MG PO TABS: 1.0000 | ORAL_TABLET | Freq: Two times a day (BID) | ORAL | 0 refills | Status: DC

## 2016-08-29 ENCOUNTER — Ambulatory Visit
Admission: RE | Admit: 2016-08-29 | Discharge: 2016-08-29 | Disposition: A | Discharge status: Home / Self Care | Payer: PPO | Source: Ambulatory Visit | Attending: Radiation Oncology | Admitting: Radiation Oncology

## 2016-08-29 DIAGNOSIS — Z510 Encounter for antineoplastic radiation therapy: Secondary | ICD-10-CM | POA: Diagnosis not present

## 2016-08-31 ENCOUNTER — Ambulatory Visit: Payer: PPO

## 2016-08-31 LAB — CULTURE, URINE COMPREHENSIVE

## 2016-09-01 ENCOUNTER — Ambulatory Visit
Admission: RE | Admit: 2016-09-01 | Discharge: 2016-09-01 | Disposition: A | Discharge status: Home / Self Care | Payer: PPO | Source: Ambulatory Visit | Attending: Radiation Oncology | Admitting: Radiation Oncology

## 2016-09-01 ENCOUNTER — Ambulatory Visit: Payer: PPO

## 2016-09-01 DIAGNOSIS — Z510 Encounter for antineoplastic radiation therapy: Secondary | ICD-10-CM | POA: Diagnosis not present

## 2016-09-01 DIAGNOSIS — C50412 Malignant neoplasm of upper-outer quadrant of left female breast: Secondary | ICD-10-CM | POA: Diagnosis not present

## 2016-09-01 DIAGNOSIS — Z170 Estrogen receptor positive status [ER+]: Secondary | ICD-10-CM | POA: Diagnosis not present

## 2016-09-02 ENCOUNTER — Ambulatory Visit
Admission: RE | Admit: 2016-09-02 | Discharge: 2016-09-02 | Disposition: A | Discharge status: Home / Self Care | Payer: PPO | Source: Ambulatory Visit | Attending: Radiation Oncology | Admitting: Radiation Oncology

## 2016-09-02 DIAGNOSIS — Z510 Encounter for antineoplastic radiation therapy: Secondary | ICD-10-CM | POA: Diagnosis not present

## 2016-09-03 ENCOUNTER — Ambulatory Visit
Admission: RE | Admit: 2016-09-03 | Discharge: 2016-09-03 | Disposition: A | Discharge status: Home / Self Care | Payer: PPO | Source: Ambulatory Visit | Attending: Radiation Oncology | Admitting: Radiation Oncology

## 2016-09-03 DIAGNOSIS — Z510 Encounter for antineoplastic radiation therapy: Secondary | ICD-10-CM | POA: Diagnosis not present

## 2016-09-04 ENCOUNTER — Ambulatory Visit
Admission: RE | Admit: 2016-09-04 | Discharge: 2016-09-04 | Disposition: A | Discharge status: Home / Self Care | Payer: PPO | Source: Ambulatory Visit | Attending: Radiation Oncology | Admitting: Radiation Oncology

## 2016-09-04 ENCOUNTER — Inpatient Hospital Stay: Payer: PPO

## 2016-09-04 DIAGNOSIS — C50412 Malignant neoplasm of upper-outer quadrant of left female breast: Secondary | ICD-10-CM | POA: Diagnosis not present

## 2016-09-04 DIAGNOSIS — Z170 Estrogen receptor positive status [ER+]: Principal | ICD-10-CM

## 2016-09-04 DIAGNOSIS — Z510 Encounter for antineoplastic radiation therapy: Secondary | ICD-10-CM | POA: Diagnosis not present

## 2016-09-04 LAB — CBC
HCT: 35.6 % (ref 35.0–47.0)
Hemoglobin: 12.4 g/dL (ref 12.0–16.0)
MCH: 32.1 pg (ref 26.0–34.0)
MCHC: 34.7 g/dL (ref 32.0–36.0)
MCV: 92.5 fL (ref 80.0–100.0)
Platelets: 233 10*3/uL (ref 150–440)
RBC: 3.85 MIL/uL (ref 3.80–5.20)
RDW: 12.7 % (ref 11.5–14.5)
WBC: 5.4 10*3/uL (ref 3.6–11.0)

## 2016-09-05 ENCOUNTER — Ambulatory Visit
Admission: RE | Admit: 2016-09-05 | Discharge: 2016-09-05 | Disposition: A | Discharge status: Home / Self Care | Payer: PPO | Source: Ambulatory Visit | Attending: Radiation Oncology | Admitting: Radiation Oncology

## 2016-09-05 DIAGNOSIS — Z510 Encounter for antineoplastic radiation therapy: Secondary | ICD-10-CM | POA: Diagnosis not present

## 2016-09-09 ENCOUNTER — Ambulatory Visit
Admission: RE | Admit: 2016-09-09 | Discharge: 2016-09-09 | Disposition: A | Discharge status: Home / Self Care | Payer: PPO | Source: Ambulatory Visit | Attending: Radiation Oncology | Admitting: Radiation Oncology

## 2016-09-09 DIAGNOSIS — Z170 Estrogen receptor positive status [ER+]: Secondary | ICD-10-CM | POA: Diagnosis not present

## 2016-09-09 DIAGNOSIS — Z510 Encounter for antineoplastic radiation therapy: Secondary | ICD-10-CM | POA: Diagnosis not present

## 2016-09-09 DIAGNOSIS — C50412 Malignant neoplasm of upper-outer quadrant of left female breast: Secondary | ICD-10-CM | POA: Diagnosis not present

## 2016-09-10 ENCOUNTER — Ambulatory Visit
Admission: RE | Admit: 2016-09-10 | Discharge: 2016-09-10 | Disposition: A | Discharge status: Home / Self Care | Payer: PPO | Source: Ambulatory Visit | Attending: Radiation Oncology | Admitting: Radiation Oncology

## 2016-09-10 DIAGNOSIS — C50412 Malignant neoplasm of upper-outer quadrant of left female breast: Secondary | ICD-10-CM | POA: Diagnosis not present

## 2016-09-10 DIAGNOSIS — Z170 Estrogen receptor positive status [ER+]: Secondary | ICD-10-CM | POA: Diagnosis not present

## 2016-09-10 DIAGNOSIS — Z510 Encounter for antineoplastic radiation therapy: Secondary | ICD-10-CM | POA: Diagnosis not present

## 2016-09-11 ENCOUNTER — Ambulatory Visit
Admission: RE | Admit: 2016-09-11 | Discharge: 2016-09-11 | Disposition: A | Discharge status: Home / Self Care | Payer: PPO | Source: Ambulatory Visit | Attending: Radiation Oncology | Admitting: Radiation Oncology

## 2016-09-11 DIAGNOSIS — Z510 Encounter for antineoplastic radiation therapy: Secondary | ICD-10-CM | POA: Diagnosis not present

## 2016-09-12 ENCOUNTER — Ambulatory Visit
Admission: RE | Admit: 2016-09-12 | Discharge: 2016-09-12 | Disposition: A | Discharge status: Home / Self Care | Payer: PPO | Source: Ambulatory Visit | Attending: Radiation Oncology | Admitting: Radiation Oncology

## 2016-09-12 DIAGNOSIS — Z510 Encounter for antineoplastic radiation therapy: Secondary | ICD-10-CM | POA: Diagnosis not present

## 2016-09-15 ENCOUNTER — Ambulatory Visit
Admission: RE | Admit: 2016-09-15 | Discharge: 2016-09-15 | Disposition: A | Discharge status: Home / Self Care | Payer: PPO | Source: Ambulatory Visit | Attending: Radiation Oncology | Admitting: Radiation Oncology

## 2016-09-15 DIAGNOSIS — Z170 Estrogen receptor positive status [ER+]: Secondary | ICD-10-CM | POA: Diagnosis not present

## 2016-09-15 DIAGNOSIS — C50412 Malignant neoplasm of upper-outer quadrant of left female breast: Secondary | ICD-10-CM | POA: Diagnosis not present

## 2016-09-15 DIAGNOSIS — Z510 Encounter for antineoplastic radiation therapy: Secondary | ICD-10-CM | POA: Diagnosis not present

## 2016-09-16 ENCOUNTER — Ambulatory Visit
Admission: RE | Admit: 2016-09-16 | Discharge: 2016-09-16 | Disposition: A | Discharge status: Home / Self Care | Payer: PPO | Source: Ambulatory Visit | Attending: Radiation Oncology | Admitting: Radiation Oncology

## 2016-09-16 DIAGNOSIS — Z510 Encounter for antineoplastic radiation therapy: Secondary | ICD-10-CM | POA: Diagnosis not present

## 2016-09-16 DIAGNOSIS — C50412 Malignant neoplasm of upper-outer quadrant of left female breast: Secondary | ICD-10-CM | POA: Diagnosis not present

## 2016-09-16 DIAGNOSIS — Z170 Estrogen receptor positive status [ER+]: Secondary | ICD-10-CM | POA: Diagnosis not present

## 2016-09-17 ENCOUNTER — Ambulatory Visit
Admission: RE | Admit: 2016-09-17 | Discharge: 2016-09-17 | Disposition: A | Discharge status: Home / Self Care | Payer: PPO | Source: Ambulatory Visit | Attending: Radiation Oncology | Admitting: Radiation Oncology

## 2016-09-17 DIAGNOSIS — Z510 Encounter for antineoplastic radiation therapy: Secondary | ICD-10-CM | POA: Diagnosis not present

## 2016-09-18 ENCOUNTER — Ambulatory Visit
Admission: RE | Admit: 2016-09-18 | Discharge: 2016-09-18 | Disposition: A | Discharge status: Home / Self Care | Payer: PPO | Source: Ambulatory Visit | Attending: Radiation Oncology | Admitting: Radiation Oncology

## 2016-09-18 ENCOUNTER — Inpatient Hospital Stay: Payer: PPO | Attending: Radiation Oncology

## 2016-09-18 DIAGNOSIS — C50412 Malignant neoplasm of upper-outer quadrant of left female breast: Secondary | ICD-10-CM | POA: Insufficient documentation

## 2016-09-18 DIAGNOSIS — Z87891 Personal history of nicotine dependence: Secondary | ICD-10-CM | POA: Insufficient documentation

## 2016-09-18 DIAGNOSIS — G9050 Complex regional pain syndrome I, unspecified: Secondary | ICD-10-CM | POA: Insufficient documentation

## 2016-09-18 DIAGNOSIS — I10 Essential (primary) hypertension: Secondary | ICD-10-CM | POA: Diagnosis not present

## 2016-09-18 DIAGNOSIS — K219 Gastro-esophageal reflux disease without esophagitis: Secondary | ICD-10-CM | POA: Diagnosis not present

## 2016-09-18 DIAGNOSIS — Z85828 Personal history of other malignant neoplasm of skin: Secondary | ICD-10-CM | POA: Insufficient documentation

## 2016-09-18 DIAGNOSIS — Z923 Personal history of irradiation: Secondary | ICD-10-CM | POA: Insufficient documentation

## 2016-09-18 DIAGNOSIS — K5900 Constipation, unspecified: Secondary | ICD-10-CM | POA: Insufficient documentation

## 2016-09-18 DIAGNOSIS — M129 Arthropathy, unspecified: Secondary | ICD-10-CM | POA: Diagnosis not present

## 2016-09-18 DIAGNOSIS — Z170 Estrogen receptor positive status [ER+]: Secondary | ICD-10-CM | POA: Diagnosis not present

## 2016-09-18 DIAGNOSIS — Z79899 Other long term (current) drug therapy: Secondary | ICD-10-CM | POA: Insufficient documentation

## 2016-09-18 DIAGNOSIS — E871 Hypo-osmolality and hyponatremia: Secondary | ICD-10-CM | POA: Insufficient documentation

## 2016-09-18 DIAGNOSIS — Z510 Encounter for antineoplastic radiation therapy: Secondary | ICD-10-CM | POA: Diagnosis not present

## 2016-09-18 LAB — CBC
HEMATOCRIT: 35.7 % (ref 35.0–47.0)
Hemoglobin: 12.3 g/dL (ref 12.0–16.0)
MCH: 31.6 pg (ref 26.0–34.0)
MCHC: 34.5 g/dL (ref 32.0–36.0)
MCV: 91.8 fL (ref 80.0–100.0)
PLATELETS: 213 10*3/uL (ref 150–440)
RBC: 3.89 MIL/uL (ref 3.80–5.20)
RDW: 12.6 % (ref 11.5–14.5)
WBC: 5.8 10*3/uL (ref 3.6–11.0)

## 2016-09-19 ENCOUNTER — Ambulatory Visit
Admission: RE | Admit: 2016-09-19 | Discharge: 2016-09-19 | Disposition: A | Discharge status: Home / Self Care | Payer: PPO | Source: Ambulatory Visit | Attending: Radiation Oncology | Admitting: Radiation Oncology

## 2016-09-19 DIAGNOSIS — Z510 Encounter for antineoplastic radiation therapy: Secondary | ICD-10-CM | POA: Diagnosis not present

## 2016-09-22 ENCOUNTER — Ambulatory Visit
Admission: RE | Admit: 2016-09-22 | Discharge: 2016-09-22 | Disposition: A | Discharge status: Home / Self Care | Payer: PPO | Source: Ambulatory Visit | Attending: Radiation Oncology | Admitting: Radiation Oncology

## 2016-09-22 DIAGNOSIS — Z510 Encounter for antineoplastic radiation therapy: Secondary | ICD-10-CM | POA: Diagnosis not present

## 2016-09-23 ENCOUNTER — Ambulatory Visit
Admission: RE | Admit: 2016-09-23 | Discharge: 2016-09-23 | Disposition: A | Discharge status: Home / Self Care | Payer: PPO | Source: Ambulatory Visit | Attending: Radiation Oncology | Admitting: Radiation Oncology

## 2016-09-23 DIAGNOSIS — Z510 Encounter for antineoplastic radiation therapy: Secondary | ICD-10-CM | POA: Diagnosis not present

## 2016-09-23 DIAGNOSIS — Z170 Estrogen receptor positive status [ER+]: Secondary | ICD-10-CM | POA: Diagnosis not present

## 2016-09-23 DIAGNOSIS — C50412 Malignant neoplasm of upper-outer quadrant of left female breast: Secondary | ICD-10-CM | POA: Diagnosis not present

## 2016-09-24 ENCOUNTER — Ambulatory Visit
Admission: RE | Admit: 2016-09-24 | Discharge: 2016-09-24 | Disposition: A | Discharge status: Home / Self Care | Payer: PPO | Source: Ambulatory Visit | Attending: Radiation Oncology | Admitting: Radiation Oncology

## 2016-09-24 DIAGNOSIS — Z510 Encounter for antineoplastic radiation therapy: Secondary | ICD-10-CM | POA: Diagnosis not present

## 2016-09-25 ENCOUNTER — Ambulatory Visit
Admission: RE | Admit: 2016-09-25 | Discharge: 2016-09-25 | Disposition: A | Discharge status: Home / Self Care | Payer: PPO | Source: Ambulatory Visit | Attending: Radiation Oncology | Admitting: Radiation Oncology

## 2016-09-25 DIAGNOSIS — Z510 Encounter for antineoplastic radiation therapy: Secondary | ICD-10-CM | POA: Diagnosis not present

## 2016-09-26 ENCOUNTER — Ambulatory Visit
Admission: RE | Admit: 2016-09-26 | Discharge: 2016-09-26 | Disposition: A | Discharge status: Home / Self Care | Payer: PPO | Source: Ambulatory Visit | Attending: Radiation Oncology | Admitting: Radiation Oncology

## 2016-09-26 DIAGNOSIS — Z510 Encounter for antineoplastic radiation therapy: Secondary | ICD-10-CM | POA: Diagnosis not present

## 2016-09-27 NOTE — Progress Notes (Signed)
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  INTERVAL HISTORY: Patient returns to clinic today for further evaluation and initiation of an aromatase inhibitor. She is tolerating her XRT well only with some mild skin discomfort.  She otherwise feels well and is asymptomatic. She has no neurologic complaints. She denies any recent fevers, chills, or illnesses. She has no chest pain or shortness of breath. She denies any nausea, vomiting, constipation, or diarrhea. She has no urinary complaints. Patient offers no further specific complaints today.  REVIEW OF SYSTEMS:   Review of Systems  Constitutional: Negative.  Negative for fever, malaise/fatigue and weight loss.  HENT: Negative for sinus pain.   Respiratory: Negative.  Negative for cough and shortness of breath.   Cardiovascular: Negative.  Negative for chest pain and leg swelling.  Gastrointestinal: Negative.  Negative for abdominal pain and constipation.  Genitourinary: Negative for hematuria and urgency.  Musculoskeletal: Negative.   Skin: Negative for itching and rash.  Neurological: Negative.  Negative for weakness and headaches.  Psychiatric/Behavioral: Negative.  The patient is not nervous/anxious and does not have insomnia.     As per HPI. Otherwise, a complete review of systems is negative.  PAST MEDICAL HISTORY: Past Medical History:  Diagnosis Date  . Arthritis   . Cancer ([place]) 03/13/2016   INVASIVE MAMMARY CARCINOMA  . Chickenpox   . Complication of anesthesia   . Frequent UTI   . GERD (gastroesophageal  reflux disease)    RARE  . Hypertension   . PONV (postoperative nausea and vomiting)   . RSD (reflex sympathetic dystrophy)   . Seasonal allergies   . Skin cancer   . Urine incontinence     PAST SURGICAL HISTORY: Past Surgical History:  Procedure Laterality Date  . ABDOMINAL HYSTERECTOMY  2005   total/ Dr Davis Gourd  . BLADDER TUMOR EXCISION  2006  . BREAST BIOPSY Left 03/13/2016   INVASIVE MAMMARY CARCINOMA  . BREAST LUMPECTOMY WITH SENTINEL LYMPH NODE BIOPSY Left 07/14/2016   Procedure: BREAST LUMPECTOMY WITH SENTINEL LYMPH NODE BX;  Surgeon: [name], MD;  Location: [facility] ORS;  Service: General;  Laterality: Left;  . denture surgery    . PORTACATH PLACEMENT N/A 04/01/2016   Procedure: INSERTION PORT-A-CATH;  Surgeon: [name], MD;  Location: [facility] ORS;  Service: General;  Laterality: N/A;  . SKIN CANCER EXCISION Right 2015   foot/ Dr [place] Crane  . TUBAL LIGATION    . TUMOR EXCISION Right    FOOT    FAMILY HISTORY: Family History  Problem Relation Age of Onset  . Emphysema Father   . Heart disease Mother   . Transient ischemic attack Mother   . Breast cancer Cousin        paternal  . Kidney cancer Neg Hx   . Bladder Cancer Neg Hx     ADVANCED DIRECTIVES (Y/N):  N  HEALTH MAINTENANCE: Social History  Substance Use Topics  . Smoking status: Former Smoker    Packs/day: 1.00    Years: 20.00    Types: Cigarettes    Quit date: 04/14/1994  . Smokeless tobacco: Never Used  . Alcohol  use No     Colonoscopy:  PAP:  Bone density:  Lipid panel:  Allergies  Allergen Reactions  . Bee Pollen Hives  . Mosquito (Diagnostic) Hives  . Other Other (See Comments)    Uncoded Allergy. Allergen: VICRYL  (90% glycolide and 10% L-lactide [polyglactin-- synthetic absorbable sterile surgical suture]), Other Reaction: Infection  . Gabapentin Swelling  . Norvasc [Amlodipine Besylate] Swelling    TO FEET  . Lisinopril Cough    Current Outpatient Prescriptions    Medication Sig Dispense Refill  . acetaminophen (TYLENOL) 500 MG tablet Take 1,000 mg by mouth every 6 (six) hours as needed for moderate pain.     . Ascorbic Acid (VITAMIN C) 1000 MG tablet Take 1,000 mg by mouth daily.    Marland Kitchen CRANBERRY PO Take 1 capsule by mouth 2 (two) times daily.    . hydrochlorothiazide (HYDRODIURIL) 25 MG tablet Take 1 tablet (25 mg total) by mouth daily. 90 tablet 1  . ibuprofen (ADVIL,MOTRIN) 200 MG tablet Take 200 mg by mouth every 6 (six) hours as needed.    Marland Kitchen losartan (COZAAR) 50 MG tablet TAKE 1 TABLET BY MOUTH ONCE A DAY 90 tablet 1  . Probiotic Product (PROBIOTIC PO) Take 1 tablet by mouth daily.    Marland Kitchen letrozole (FEMARA) 2.5 MG tablet Take 1 tablet (2.5 mg total) by mouth daily. 30 tablet 11   No current facility-administered medications for this visit.     OBJECTIVE: Vitals:   09/29/16 1338  BP: 130/77  Pulse: 97  Temp: 97.6 F (36.4 C)     Body mass index is 25.91 kg/m.    ECOG FS:0 - Asymptomatic  General: Well-developed, well-nourished, no acute distress. Eyes: Pink conjunctiva, anicteric sclera. Breasts: Well healing surgical scar in left breast. Exam deferred today. Lungs: Clear to auscultation bilaterally. Heart: Regular rate and rhythm. No rubs, murmurs, or gallops. Abdomen: Soft, nontender, nondistended. No organomegaly noted, normoactive bowel sounds. Musculoskeletal: No edema, cyanosis, or clubbing. Neuro: Alert, answering all questions appropriately. Cranial nerves grossly intact. Skin: No rash or petechiae noted. Psych: Normal affect.   LAB RESULTS:  Lab Results  Component Value Date   NA 134 (L) 09/29/2016   K 4.2 09/29/2016   CL 101 09/29/2016   CO2 24 09/29/2016   GLUCOSE 112 (H) 09/29/2016   BUN 27 (H) 09/29/2016   CREATININE 0.93 09/29/2016   CALCIUM 8.5 (L) 09/29/2016   PROT 7.2 09/29/2016   ALBUMIN 4.0 09/29/2016   AST 27 09/29/2016   ALT 31 09/29/2016   ALKPHOS 58 09/29/2016   BILITOT 0.7 09/29/2016   GFRNONAA 59  (L) 09/29/2016   GFRAA >60 09/29/2016    Lab Results  Component Value Date   WBC 5.2 09/29/2016   NEUTROABS 3.1 09/29/2016   HGB 11.4 (L) 09/29/2016   HCT 32.8 (L) 09/29/2016   MCV 90.9 09/29/2016   PLT 225 09/29/2016     STUDIES: No results found.  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  PLAN:    1. 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 She then underwent a lumpectomy on July 14, 2016. She will complete adjuvant XRT later this week. Patient was given a prescription for letrozole which she will take for a total of 5 years completing in June 2023. Return to clinic  in 3 months for further evaluation. 2. Hyponatremia: Mild. 3. Constipation: Continue OTC treatments as needed. 4. Decreased EF%: Patient had a pretreatment EF of 41%. Continue monitoring evaluation per cardiology. 5. Postmenopausal: Will get baseline bone mineral density in the next 1-2 weeks for further evaluation.  Approximately 30 minutes was spent in discussion of which greater than 50% was consultation.  Patient expressed understanding and was in agreement with this plan. She also understands that She can call clinic at any time with any questions, concerns, or complaints.   Cancer Staging Malignant neoplasm of upper-outer quadrant of left female breast [facility]) Staging form: Breast, AJCC 7th Edition - Clinical stage from 03/19/2016: Stage IIA (T2, N0, M0) - Signed by Lloyd Huger, MD on 04/06/2016 - Pathologic stage from 07/27/2016: Stage IIA (T2, N0, cM0) - Signed by Lloyd Huger, MD on 07/27/2016    Lloyd Huger, MD 09/30/16 11:02 AM

## 2016-09-29 ENCOUNTER — Inpatient Hospital Stay (HOSPITAL_BASED_OUTPATIENT_CLINIC_OR_DEPARTMENT_OTHER): Payer: PPO | Admitting: Oncology

## 2016-09-29 ENCOUNTER — Ambulatory Visit
Admission: RE | Admit: 2016-09-29 | Discharge: 2016-09-29 | Disposition: A | Discharge status: Home / Self Care | Payer: PPO | Source: Ambulatory Visit | Attending: Radiation Oncology | Admitting: Radiation Oncology

## 2016-09-29 ENCOUNTER — Inpatient Hospital Stay: Payer: PPO

## 2016-09-29 VITALS — BP 130/77 | HR 97 | Temp 97.6°F | Wt 165.4 lb

## 2016-09-29 DIAGNOSIS — Z923 Personal history of irradiation: Secondary | ICD-10-CM | POA: Diagnosis not present

## 2016-09-29 DIAGNOSIS — C50412 Malignant neoplasm of upper-outer quadrant of left female breast: Secondary | ICD-10-CM

## 2016-09-29 DIAGNOSIS — M129 Arthropathy, unspecified: Secondary | ICD-10-CM

## 2016-09-29 DIAGNOSIS — Z85828 Personal history of other malignant neoplasm of skin: Secondary | ICD-10-CM

## 2016-09-29 DIAGNOSIS — Z87891 Personal history of nicotine dependence: Secondary | ICD-10-CM | POA: Diagnosis not present

## 2016-09-29 DIAGNOSIS — E871 Hypo-osmolality and hyponatremia: Secondary | ICD-10-CM

## 2016-09-29 DIAGNOSIS — Z95828 Presence of other vascular implants and grafts: Secondary | ICD-10-CM

## 2016-09-29 DIAGNOSIS — Z170 Estrogen receptor positive status [ER+]: Secondary | ICD-10-CM

## 2016-09-29 DIAGNOSIS — K219 Gastro-esophageal reflux disease without esophagitis: Secondary | ICD-10-CM

## 2016-09-29 DIAGNOSIS — K5900 Constipation, unspecified: Secondary | ICD-10-CM | POA: Diagnosis not present

## 2016-09-29 DIAGNOSIS — I10 Essential (primary) hypertension: Secondary | ICD-10-CM

## 2016-09-29 DIAGNOSIS — G9050 Complex regional pain syndrome I, unspecified: Secondary | ICD-10-CM

## 2016-09-29 DIAGNOSIS — Z79899 Other long term (current) drug therapy: Secondary | ICD-10-CM

## 2016-09-29 DIAGNOSIS — Z510 Encounter for antineoplastic radiation therapy: Secondary | ICD-10-CM | POA: Diagnosis not present

## 2016-09-29 LAB — COMPREHENSIVE METABOLIC PANEL
ALT: 31 U/L (ref 14–54)
ANION GAP: 9 (ref 5–15)
AST: 27 U/L (ref 15–41)
Albumin: 4 g/dL (ref 3.5–5.0)
Alkaline Phosphatase: 58 U/L (ref 38–126)
BUN: 27 mg/dL — ABNORMAL HIGH (ref 6–20)
CALCIUM: 8.5 mg/dL — AB (ref 8.9–10.3)
CHLORIDE: 101 mmol/L (ref 101–111)
CO2: 24 mmol/L (ref 22–32)
Creatinine, Ser: 0.93 mg/dL (ref 0.44–1.00)
GFR calc non Af Amer: 59 mL/min — ABNORMAL LOW (ref >60)
Glucose, Bld: 112 mg/dL — ABNORMAL HIGH (ref 65–99)
Potassium: 4.2 mmol/L (ref 3.5–5.1)
SODIUM: 134 mmol/L — AB (ref 135–145)
Total Bilirubin: 0.7 mg/dL (ref 0.3–1.2)
Total Protein: 7.2 g/dL (ref 6.5–8.1)

## 2016-09-29 LAB — CBC WITH DIFFERENTIAL/PLATELET
Basophils Absolute: 0.1 10*3/uL (ref 0–0.1)
Basophils Relative: 1 %
EOS ABS: 0.2 10*3/uL (ref 0–0.7)
EOS PCT: 3 %
HCT: 32.8 % — ABNORMAL LOW (ref 35.0–47.0)
Hemoglobin: 11.4 g/dL — ABNORMAL LOW (ref 12.0–16.0)
LYMPHS ABS: 1.5 10*3/uL (ref 1.0–3.6)
Lymphocytes Relative: 29 %
MCH: 31.6 pg (ref 26.0–34.0)
MCHC: 34.8 g/dL (ref 32.0–36.0)
MCV: 90.9 fL (ref 80.0–100.0)
MONO ABS: 0.4 10*3/uL (ref 0.2–0.9)
MONOS PCT: 8 %
Neutro Abs: 3.1 10*3/uL (ref 1.4–6.5)
Neutrophils Relative %: 59 %
Platelets: 225 10*3/uL (ref 150–440)
RBC: 3.61 MIL/uL — ABNORMAL LOW (ref 3.80–5.20)
RDW: 12.9 % (ref 11.5–14.5)
WBC: 5.2 10*3/uL (ref 3.6–11.0)

## 2016-09-29 MED ORDER — LETROZOLE 2.5 MG PO TABS: 2.5000 mg | ORAL_TABLET | Freq: Every day | ORAL | 11 refills | Status: DC

## 2016-09-29 MED ORDER — HEPARIN SOD (PORK) LOCK FLUSH 100 UNIT/ML IV SOLN
500.0000 [IU] | Freq: Once | INTRAVENOUS | Status: AC
  Administered 2016-09-29: 500 [IU] via INTRAVENOUS

## 2016-09-29 MED ORDER — SODIUM CHLORIDE 0.9% FLUSH
10.0000 mL | INTRAVENOUS | Status: DC | PRN
  Administered 2016-09-29: 10 mL via INTRAVENOUS
  Filled 2016-09-29: qty 10

## 2016-09-29 NOTE — Progress Notes (Signed)
Patient here today for follow up.  Patient states no new concerns today  

## 2016-09-30 ENCOUNTER — Ambulatory Visit
Admission: RE | Admit: 2016-09-30 | Discharge: 2016-09-30 | Disposition: A | Discharge status: Home / Self Care | Payer: PPO | Source: Ambulatory Visit | Attending: Radiation Oncology | Admitting: Radiation Oncology

## 2016-09-30 DIAGNOSIS — Z170 Estrogen receptor positive status [ER+]: Secondary | ICD-10-CM | POA: Diagnosis not present

## 2016-09-30 DIAGNOSIS — Z510 Encounter for antineoplastic radiation therapy: Secondary | ICD-10-CM | POA: Diagnosis not present

## 2016-09-30 DIAGNOSIS — C50412 Malignant neoplasm of upper-outer quadrant of left female breast: Secondary | ICD-10-CM | POA: Diagnosis not present

## 2016-10-01 ENCOUNTER — Ambulatory Visit
Admission: RE | Admit: 2016-10-01 | Discharge: 2016-10-01 | Disposition: A | Discharge status: Home / Self Care | Payer: PPO | Source: Ambulatory Visit | Attending: Radiation Oncology | Admitting: Radiation Oncology

## 2016-10-01 DIAGNOSIS — Z510 Encounter for antineoplastic radiation therapy: Secondary | ICD-10-CM | POA: Diagnosis not present

## 2016-10-07 ENCOUNTER — Ambulatory Visit (INDEPENDENT_AMBULATORY_CARE_PROVIDER_SITE_OTHER): Payer: PPO | Admitting: General Surgery

## 2016-10-07 ENCOUNTER — Encounter: Payer: Self-pay | Admitting: General Surgery

## 2016-10-07 VITALS — BP 132/78 | HR 104 | Resp 12 | Ht 67.0 in | Wt 165.0 lb

## 2016-10-07 DIAGNOSIS — Z170 Estrogen receptor positive status [ER+]: Secondary | ICD-10-CM

## 2016-10-07 DIAGNOSIS — C50412 Malignant neoplasm of upper-outer quadrant of left female breast: Secondary | ICD-10-CM

## 2016-10-07 NOTE — Progress Notes (Signed)
Patient ID: Catherine Tate, female   DOB: 1940-06-17, 76 y.o.   MRN: [id number]  Chief Complaint  Patient presents with  . Follow-up    HPI Catherine Tate is a 76 y.o. female.  Here today for postoperative visit, left breast lumpectomy 07-14-16. Occasional shooting pains left breast. Tolerating letrozole with mild hot flashes. She completed radiation last week. Bone density scheduled for August.  HPI  Past Medical History:  Diagnosis Date  . Arthritis   . Cancer ([place]) 03/13/2016   INVASIVE MAMMARY CARCINOMA  . Chickenpox   . Complication of anesthesia   . Frequent UTI   . GERD (gastroesophageal reflux disease)    RARE  . Hypertension   . PONV (postoperative nausea and vomiting)   . RSD (reflex sympathetic dystrophy)   . Seasonal allergies   . Skin cancer   . Urine incontinence     Past Surgical History:  Procedure Laterality Date  . ABDOMINAL HYSTERECTOMY  2005   total/ Dr Davis Gourd  . BLADDER TUMOR EXCISION  2006  . BREAST BIOPSY Left 03/13/2016   INVASIVE MAMMARY CARCINOMA  . BREAST LUMPECTOMY WITH SENTINEL LYMPH NODE BIOPSY Left 07/14/2016   Procedure: BREAST LUMPECTOMY WITH SENTINEL LYMPH NODE BX;  Surgeon: [name], MD;  Location: [facility] ORS;  Service: General;  Laterality: Left;  . denture surgery    . PORTACATH PLACEMENT N/A 04/01/2016   Procedure: INSERTION PORT-A-CATH;  Surgeon: [name], MD;  Location: [facility] ORS;  Service: General;  Laterality: N/A;  . SKIN CANCER EXCISION Right 2015   foot/ Dr Nevada Crane  . TUBAL LIGATION    . TUMOR EXCISION Right    FOOT    Family History  Problem Relation Age of Onset  . Emphysema Father   . Heart disease Mother   . Transient ischemic attack Mother   . Breast cancer Cousin        paternal  . Kidney cancer Neg Hx   . Bladder Cancer Neg Hx     Social History Social History  Substance Use Topics  . Smoking status: Former Smoker    Packs/day: 1.00    Years: 20.00    Types: Cigarettes    Quit date:  04/14/1994  . Smokeless tobacco: Never Used  . Alcohol use No    Allergies  Allergen Reactions  . Bee Pollen Hives  . Mosquito (Diagnostic) Hives  . Other Other (See Comments)    Uncoded Allergy. Allergen: VICRYL  (90% glycolide and 10% L-lactide [polyglactin-- synthetic absorbable sterile surgical suture]), Other Reaction: Infection  . Gabapentin Swelling  . Norvasc [Amlodipine Besylate] Swelling    TO FEET  . Lisinopril Cough    Current Outpatient Prescriptions  Medication Sig Dispense Refill  . acetaminophen (TYLENOL) 500 MG tablet Take 1,000 mg by mouth every 6 (six) hours as needed for moderate pain.     . Ascorbic Acid (VITAMIN C) 1000 MG tablet Take 1,000 mg by mouth daily.    Marland Kitchen CRANBERRY PO Take 1 capsule by mouth 2 (two) times daily.    . hydrochlorothiazide (HYDRODIURIL) 25 MG tablet Take 1 tablet (25 mg total) by mouth daily. 90 tablet 1  . ibuprofen (ADVIL,MOTRIN) 200 MG tablet Take 200 mg by mouth every 6 (six) hours as needed.    Marland Kitchen letrozole (FEMARA) 2.5 MG tablet Take 1 tablet (2.5 mg total) by mouth daily. 30 tablet 11  . losartan (COZAAR) 50 MG tablet TAKE 1 TABLET BY MOUTH ONCE A DAY 90 tablet 1  .  Probiotic Product (PROBIOTIC PO) Take 1 tablet by mouth daily.     No current facility-administered medications for this visit.     Review of Systems Review of Systems  Constitutional: Negative.   Respiratory: Negative.   Cardiovascular: Negative.     Blood pressure 132/78, pulse (!) 104, resp. rate 12, height 5\' 7"  (1.702 m), weight 165 lb (74.8 kg).  Physical Exam Physical Exam  Constitutional: She is oriented to person, place, and time. She appears well-developed and well-nourished.  HENT:  Mouth/Throat: Oropharynx is clear and moist.  Eyes: Conjunctivae are normal. No scleral icterus.  Neck: Neck supple.  Cardiovascular: Normal rate, regular rhythm and normal heart sounds.   Pulmonary/Chest: Effort normal and breath sounds normal. Right breast exhibits no  inverted nipple, no mass, no nipple discharge, no skin change and no tenderness. Left breast exhibits skin change. Left breast exhibits no inverted nipple, no mass, no nipple discharge and no tenderness.    Left breast lumpectomy incision well healed with radiation changes. Mild edema at left nipple.  Musculoskeletal:       Arms: Lymphadenopathy:    She has no cervical adenopathy.    She has no axillary adenopathy.  Neurological: She is alert and oriented to person, place, and time.  Skin: Skin is warm and dry.  Psychiatric: Her behavior is normal.    Data Reviewed Medical oncology note of 09/29/2016 reviewed.  Letrozole to be started after completion of whole breast radiation.  Assessment    Doing well status post whole breast radiation with boost.      Plan    The patient is doing quite well. Encouraged to make use of calcium supplements.  Will touch with Dr. Grayland Ormond regarding port removal.     Recommend adding calcium 1200 mg with vitamin D daily.  The patient has been asked to return to the office in 10 months with a bilateral diagnostic mammogram.  HPI, Physical Exam, Assessment and Plan have been scribed under the direction and in the presence of [name], MD.  Karie Fetch, RN   [name] 10/07/2016, 10:10 PM

## 2016-10-07 NOTE — Patient Instructions (Addendum)
The patient is aware to call back for any questions or concerns.  Recommend adding calcium 1200 mg with vitamin D daily.

## 2016-10-16 ENCOUNTER — Other Ambulatory Visit: Payer: Self-pay | Admitting: Primary Care

## 2016-10-16 DIAGNOSIS — I10 Essential (primary) hypertension: Secondary | ICD-10-CM

## 2016-10-22 ENCOUNTER — Ambulatory Visit (INDEPENDENT_AMBULATORY_CARE_PROVIDER_SITE_OTHER): Payer: PPO | Admitting: General Surgery

## 2016-10-22 ENCOUNTER — Encounter: Payer: Self-pay | Admitting: General Surgery

## 2016-10-22 VITALS — BP 140/78 | HR 109 | Resp 14 | Ht 67.0 in | Wt 158.0 lb

## 2016-10-22 DIAGNOSIS — C50412 Malignant neoplasm of upper-outer quadrant of left female breast: Secondary | ICD-10-CM

## 2016-10-22 DIAGNOSIS — Z170 Estrogen receptor positive status [ER+]: Secondary | ICD-10-CM | POA: Diagnosis not present

## 2016-10-22 NOTE — Patient Instructions (Addendum)
May shower May use ice pack as needed for comfort May remove dressing in 2-3 days. Steri strip will gradually come off in 2-3 weeks.

## 2016-10-22 NOTE — Progress Notes (Signed)
Patient ID: Catherine Tate, female   DOB: 1940/10/28, 76 y.o.   MRN: [id number]  Chief Complaint  Patient presents with  . Procedure    HPI Catherine Tate is a 76 y.o. female here today for a port removal.  HPI  Past Medical History:  Diagnosis Date  . Arthritis   . Cancer ([place]) 03/13/2016   INVASIVE MAMMARY CARCINOMA  . Chickenpox   . Complication of anesthesia   . Frequent UTI   . GERD (gastroesophageal reflux disease)    RARE  . Hypertension   . PONV (postoperative nausea and vomiting)   . RSD (reflex sympathetic dystrophy)   . Seasonal allergies   . Skin cancer   . Urine incontinence     Past Surgical History:  Procedure Laterality Date  . ABDOMINAL HYSTERECTOMY  2005   total/ Dr Catherine Tate  . BLADDER TUMOR EXCISION  2006  . BREAST BIOPSY Left 03/13/2016   INVASIVE MAMMARY CARCINOMA  . BREAST LUMPECTOMY WITH SENTINEL LYMPH NODE BIOPSY Left 07/14/2016   Procedure: BREAST LUMPECTOMY WITH SENTINEL LYMPH NODE BX;  Surgeon: Catherine Bellow, MD;  Location: [facility] ORS;  Service: General;  Laterality: Left;  . denture surgery    . PORTACATH PLACEMENT N/A 04/01/2016   Procedure: INSERTION PORT-A-CATH;  Surgeon: Catherine Bellow, MD;  Location: [facility] ORS;  Service: General;  Laterality: N/A;  . SKIN CANCER EXCISION Right 2015   foot/ Dr Catherine Tate  . TUBAL LIGATION    . TUMOR EXCISION Right    FOOT    Family History  Problem Relation Age of Onset  . Emphysema Father   . Heart disease Mother   . Transient ischemic attack Mother   . Breast cancer Cousin        paternal  . Kidney cancer Neg Hx   . Bladder Cancer Neg Hx     Social History Social History  Substance Use Topics  . Smoking status: Former Smoker    Packs/day: 1.00    Years: 20.00    Types: Cigarettes    Quit date: 04/14/1994  . Smokeless tobacco: Never Used  . Alcohol use No    Allergies  Allergen Reactions  . Bee Pollen Hives  . Mosquito (Diagnostic) Hives  . Other Other (See Comments)    Uncoded  Allergy. Allergen: VICRYL  (90% glycolide and 10% L-lactide [polyglactin-- synthetic absorbable sterile surgical suture]), Other Reaction: Infection  . Gabapentin Swelling  . Norvasc [Amlodipine Besylate] Swelling    TO FEET  . Lisinopril Cough    Current Outpatient Prescriptions  Medication Sig Dispense Refill  . acetaminophen (TYLENOL) 500 MG tablet Take 1,000 mg by mouth every 6 (six) hours as needed for moderate pain.     . Ascorbic Acid (VITAMIN C) 1000 MG tablet Take 1,000 mg by mouth daily.    Marland Kitchen CRANBERRY PO Take 1 capsule by mouth 2 (two) times daily.    . hydrochlorothiazide (HYDRODIURIL) 25 MG tablet Take 1 tablet (25 mg total) by mouth daily. 90 tablet 1  . ibuprofen (ADVIL,MOTRIN) 200 MG tablet Take 200 mg by mouth every 6 (six) hours as needed.    Marland Kitchen letrozole (FEMARA) 2.5 MG tablet Take 1 tablet (2.5 mg total) by mouth daily. 30 tablet 11  . losartan (COZAAR) 50 MG tablet TAKE 1 TABLET BY MOUTH ONCE A DAY 90 tablet 1  . Probiotic Product (PROBIOTIC PO) Take 1 tablet by mouth daily.     No current facility-administered medications for this visit.  Review of Systems Review of Systems  Constitutional: Negative.   Respiratory: Negative.     Blood pressure 140/78, pulse (!) 109, resp. rate 14, height 5\' 7"  (1.702 m), weight 158 lb (71.7 kg).  Physical Exam Physical Exam  Data Reviewed The procedure was reviewed and the patient was amenable to proceed. The skin was cleansed with alcohol followed by 10 mL of 0.5% Xylocaine with 0.25% Marcaine with 1-200,000 of epinephrine. The original incision was opened and the previously placed transfixion sutures removed. The port was mobilized and the Intact catheter removed without bleeding. The wound was closed in layers with a running 3-0 Vicryl to the adipose layer followed by running 3-0 Vicryl subcuticular suture for the skin. Benzoin, Steri-Strips followed by Telfa and Tegaderm dressing applied. Post procedure wound care reviewed  with the nurse.  Assessment    Doing well status post adjuvant chemotherapy.    Plan    Patient has completed adjuvant chemotherapy and tolerated port removal out L effect.    Follow up as scheduled April 2019.  HPI, Physical Exam, Assessment and Plan have been scribed under the direction and in the presence of Hervey Ard, MD.  [name], CMA  I have completed the exam and reviewed the above documentation for accuracy and completeness.  I agree with the above.  Haematologist has been used and any errors in dictation or transcription are unintentional.  Hervey Ard, M.D., F.A.C.S.   Catherine Tate 10/22/2016, 9:34 PM

## 2016-11-06 ENCOUNTER — Encounter: Payer: Self-pay | Admitting: Radiation Oncology

## 2016-11-06 ENCOUNTER — Ambulatory Visit
Admission: RE | Admit: 2016-11-06 | Discharge: 2016-11-06 | Disposition: A | Discharge status: Home / Self Care | Payer: PPO | Source: Ambulatory Visit | Attending: Radiation Oncology | Admitting: Radiation Oncology

## 2016-11-06 VITALS — BP 152/94 | HR 106 | Temp 98.2°F | Resp 20 | Wt 166.8 lb

## 2016-11-06 DIAGNOSIS — Z79811 Long term (current) use of aromatase inhibitors: Secondary | ICD-10-CM | POA: Diagnosis not present

## 2016-11-06 DIAGNOSIS — C50412 Malignant neoplasm of upper-outer quadrant of left female breast: Secondary | ICD-10-CM | POA: Diagnosis not present

## 2016-11-06 DIAGNOSIS — Z923 Personal history of irradiation: Secondary | ICD-10-CM | POA: Diagnosis not present

## 2016-11-06 DIAGNOSIS — Z170 Estrogen receptor positive status [ER+]: Secondary | ICD-10-CM | POA: Diagnosis not present

## 2016-11-06 NOTE — Progress Notes (Signed)
Radiation Oncology Follow up Note  Name: Catherine Tate   Date:   11/06/2016 MRN:  [id number] DOB: [birth year]    This 76 y.o. 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 She specifically denies breast tenderness cough or bone pain.. She is currently on Femara tolerating that well without side effect.  COMPLICATIONS OF TREATMENT: none  FOLLOW UP COMPLIANCE: keeps appointments   PHYSICAL EXAM:  BP (!) 152/94   Pulse (!) 106   Temp 98.2 F (36.8 C)   Resp 20   Wt 166 lb 12.5 oz (75.7 kg)   BMI 26.12 kg/m  Lungs are clear to A&P cardiac examination essentially unremarkable with regular rate and rhythm. No dominant mass or nodularity is noted in either breast in 2 positions examined. Incision is well-healed. No axillary or supraclavicular adenopathy is appreciated. Cosmetic result is excellent. Well-developed well-nourished patient in NAD. HEENT reveals PERLA, EOMI, discs not visualized.  Oral cavity is clear. No oral mucosal lesions are identified. Neck is clear without evidence of cervical or supraclavicular adenopathy. Lungs are clear to A&P. Cardiac examination is essentially unremarkable with regular rate and rhythm without murmur rub or thrill. Abdomen is benign with no organomegaly or masses noted. Motor sensory and DTR levels are equal and symmetric in the upper and lower extremities. Cranial nerves II through XII are grossly intact. Proprioception is intact. No peripheral adenopathy or edema is identified.  No motor or sensory levels are noted. Crude visual fields are within normal range.  RADIOLOGY RESULTS: No current films for review  PLAN: Present time patient is recovering nicely from her radiation therapy treatments. I'm please were overall progress. I've asked to see her back in 4-5 months for follow-up. She continues on Femara without side effect. Patient knows to call with any concerns.  I would like to take this opportunity to thank you for allowing me to participate in the care of your patient.Armstead Peaks., MD

## 2016-11-10 ENCOUNTER — Inpatient Hospital Stay: Payer: PPO

## 2016-11-13 ENCOUNTER — Ambulatory Visit
Admission: RE | Admit: 2016-11-13 | Discharge: 2016-11-13 | Disposition: A | Discharge status: Home / Self Care | Payer: PPO | Source: Ambulatory Visit | Attending: Oncology | Admitting: Oncology

## 2016-11-13 DIAGNOSIS — C50412 Malignant neoplasm of upper-outer quadrant of left female breast: Secondary | ICD-10-CM | POA: Diagnosis not present

## 2016-11-13 DIAGNOSIS — M85852 Other specified disorders of bone density and structure, left thigh: Secondary | ICD-10-CM | POA: Diagnosis not present

## 2016-11-13 DIAGNOSIS — M8588 Other specified disorders of bone density and structure, other site: Secondary | ICD-10-CM | POA: Diagnosis not present

## 2016-11-13 DIAGNOSIS — Z170 Estrogen receptor positive status [ER+]: Secondary | ICD-10-CM | POA: Diagnosis not present

## 2016-11-13 DIAGNOSIS — Z780 Asymptomatic menopausal state: Secondary | ICD-10-CM | POA: Insufficient documentation

## 2016-11-19 ENCOUNTER — Telehealth: Payer: Self-pay

## 2016-11-19 NOTE — Telephone Encounter (Signed)
Advised son that patient needs to take a calcium and vitamin d supplement. He agreed and voiced understanding.

## 2016-11-19 NOTE — Telephone Encounter (Signed)
-----   Message from Lloyd Huger, MD sent at 11/18/2016  1:19 PM EDT ----- She is osteopenic and needs to take calcium and Vit D supplement if she is not already doing so.   ----- Message ----- From: Luretha Murphy, CMA Sent: 11/14/2016   3:47 PM To: Lloyd Huger, MD    ----- Message ----- From: Manus Rudd, RN Sent: 11/14/2016  11:07 AM To: Lloyd Huger, MD, Johney Maine, RN, #  Patient would like a results call for her Bone Density she is staying with her son and is using his phone. [phone]

## 2016-12-19 NOTE — Progress Notes (Signed)
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  INTERVAL HISTORY: Patient returns to clinic today for further evaluation and to assess her toleration of letrozole. She currently feels well and is asymptomatic. She has occasional nausea when she takes her medication without food. She has no neurologic complaints. She denies any recent fevers, chills, or illnesses. She has no chest pain or shortness of breath. She denies any vomiting, constipation, or diarrhea. She has no urinary complaints. Patient offers no further specific complaints today.  REVIEW OF SYSTEMS:   Review of Systems  Constitutional: Negative.  Negative for fever, malaise/fatigue and weight loss.  HENT: Negative for sinus pain.   Respiratory: Negative.  Negative for cough and shortness of breath.   Cardiovascular: Negative.  Negative for chest pain and leg swelling.  Gastrointestinal: Positive for nausea. Negative for abdominal pain and constipation.  Genitourinary: Negative for hematuria and urgency.  Musculoskeletal: Negative.   Skin: Negative.  Negative for rash.  Neurological: Negative.  Negative for weakness and headaches.  Psychiatric/Behavioral: Negative.  The patient is not nervous/anxious and does not have insomnia.     As per HPI. Otherwise, a complete review of systems is negative.  PAST MEDICAL HISTORY: Past Medical History:  Diagnosis Date  . Arthritis   . Cancer ([place]) 03/13/2016   INVASIVE MAMMARY CARCINOMA  . Chickenpox   . Complication of anesthesia   . Frequent UTI   . GERD  (gastroesophageal reflux disease)    RARE  . Hypertension   . PONV (postoperative nausea and vomiting)   . RSD (reflex sympathetic dystrophy)   . Seasonal allergies   . Skin cancer   . Urine incontinence     PAST SURGICAL HISTORY: Past Surgical History:  Procedure Laterality Date  . ABDOMINAL HYSTERECTOMY  2005   total/ Dr Davis Gourd  . BLADDER TUMOR EXCISION  2006  . BREAST BIOPSY Left 03/13/2016   INVASIVE MAMMARY CARCINOMA  . BREAST LUMPECTOMY WITH SENTINEL LYMPH NODE BIOPSY Left 07/14/2016   Procedure: BREAST LUMPECTOMY WITH SENTINEL LYMPH NODE BX;  Surgeon: [name], MD;  Location: [facility] ORS;  Service: General;  Laterality: Left;  . denture surgery    . PORTACATH PLACEMENT N/A 04/01/2016   Procedure: INSERTION PORT-A-CATH;  Surgeon: [name], MD;  Location: [facility] ORS;  Service: General;  Laterality: N/A;  . SKIN CANCER EXCISION Right 2015   foot/ Dr [place] Crane  . TUBAL LIGATION    . TUMOR EXCISION Right    FOOT    FAMILY HISTORY: Family History  Problem Relation Age of Onset  . Emphysema Father   . Heart disease Mother   . Transient ischemic attack Mother   . Breast cancer Cousin        paternal  . Kidney cancer Neg Hx   . Bladder Cancer Neg Hx     ADVANCED DIRECTIVES (Y/N):  N  HEALTH MAINTENANCE: Social History  Substance Use Topics  . Smoking status: Former Smoker    Packs/day: 1.00    Years: 20.00    Types: Cigarettes    Quit date: 04/14/1994  . Smokeless tobacco: Never Used  . Alcohol use  No     Colonoscopy:  PAP:  Bone density:  Lipid panel:  Allergies  Allergen Reactions  . Bee Pollen Hives  . Mosquito (Diagnostic) Hives  . Other Other (See Comments)    Uncoded Allergy. Allergen: VICRYL  (90% glycolide and 10% L-lactide [polyglactin-- synthetic absorbable sterile surgical suture]), Other Reaction: Infection  . Gabapentin Swelling  . Norvasc [Amlodipine Besylate] Swelling    TO FEET  . Lisinopril Cough    Current Outpatient  Prescriptions  Medication Sig Dispense Refill  . acetaminophen (TYLENOL) 500 MG tablet Take 1,000 mg by mouth every 6 (six) hours as needed for moderate pain.     . Ascorbic Acid (VITAMIN C) 1000 MG tablet Take 1,000 mg by mouth daily.    . Calcium Carb-Cholecalciferol (CALCIUM/VITAMIN D PO) Take 1,200 mg by mouth.    . CRANBERRY PO Take 1 capsule by mouth 2 (two) times daily.    . hydrochlorothiazide (HYDRODIURIL) 25 MG tablet Take 1 tablet (25 mg total) by mouth daily. 90 tablet 1  . ibuprofen (ADVIL,MOTRIN) 200 MG tablet Take 200 mg by mouth every 6 (six) hours as needed.    Marland Kitchen letrozole (FEMARA) 2.5 MG tablet Take 1 tablet (2.5 mg total) by mouth daily. 90 tablet 3  . losartan (COZAAR) 50 MG tablet TAKE 1 TABLET BY MOUTH ONCE A DAY 90 tablet 1  . Probiotic Product (PROBIOTIC PO) Take 1 tablet by mouth daily.     No current facility-administered medications for this visit.     OBJECTIVE: Vitals:   12/22/16 1130  BP: (!) 143/87  Pulse: (!) 105  Resp: 18  Temp: 97.6 F (36.4 C)     Body mass index is 25.7 kg/m.    ECOG FS:0 - Asymptomatic  General: Well-developed, well-nourished, no acute distress. Eyes: Pink conjunctiva, anicteric sclera. Breasts: Well healing surgical scar in left breast. Exam deferred today. Lungs: Clear to auscultation bilaterally. Heart: Regular rate and rhythm. No rubs, murmurs, or gallops. Abdomen: Soft, nontender, nondistended. No organomegaly noted, normoactive bowel sounds. Musculoskeletal: No edema, cyanosis, or clubbing. Neuro: Alert, answering all questions appropriately. Cranial nerves grossly intact. Skin: No rash or petechiae noted. Psych: Normal affect.   LAB RESULTS:  Lab Results  Component Value Date   NA 137 12/22/2016   K 4.4 12/22/2016   CL 101 12/22/2016   CO2 28 12/22/2016   GLUCOSE 135 (H) 12/22/2016   BUN 21 (H) 12/22/2016   CREATININE 1.06 (H) 12/22/2016   CALCIUM 9.8 12/22/2016   PROT 7.5 12/22/2016   ALBUMIN 4.1  12/22/2016   AST 29 12/22/2016   ALT 37 12/22/2016   ALKPHOS 57 12/22/2016   BILITOT 0.7 12/22/2016   GFRNONAA 50 (L) 12/22/2016   GFRAA 58 (L) 12/22/2016    Lab Results  Component Value Date   WBC 5.0 12/22/2016   NEUTROABS 3.4 12/22/2016   HGB 12.4 12/22/2016   HCT 35.2 12/22/2016   MCV 92.8 12/22/2016   PLT 231 12/22/2016     STUDIES: No results found.  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  PLAN:    1. 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 She then underwent a lumpectomy on July 14, 2016 and completed adjuvant XRT. Continue letrozole for total 5 years completing in June 2023. Return to clinic  in 6 months for further evaluation. Patient will require repeat mammogram in November 2018. 2. Hyponatremia: Resolved. 3. Constipation: Continue OTC treatments as needed. 4. Decreased EF%: Patient had a pretreatment EF of 41%. Continue monitoring evaluation per cardiology. 5. Osteopenia: Bone mineral density on November 13, 2016 revealed a T score of -2.3. Continue calcium and vitamin D. Repeat in one year. If patient has any further decrease in her density, will consider Fosamax.   Patient expressed understanding and was in agreement with this plan. She also understands that She can call clinic at any time with any questions, concerns, or complaints.   Cancer Staging Malignant neoplasm of upper-outer quadrant of left female breast [facility]) Staging form: Breast, AJCC 7th Edition - Clinical stage from 03/19/2016: Stage IIA (T2, N0, M0) - Signed by Lloyd Huger, MD on 04/06/2016 - Pathologic stage from 07/27/2016: Stage IIA (T2, N0, cM0) - Signed by Lloyd Huger, MD on 07/27/2016    Lloyd Huger, MD 12/22/16 12:41 PM

## 2016-12-22 ENCOUNTER — Inpatient Hospital Stay: Payer: PPO | Attending: Oncology

## 2016-12-22 ENCOUNTER — Inpatient Hospital Stay (HOSPITAL_BASED_OUTPATIENT_CLINIC_OR_DEPARTMENT_OTHER): Payer: PPO | Admitting: Oncology

## 2016-12-22 ENCOUNTER — Other Ambulatory Visit: Payer: PPO

## 2016-12-22 ENCOUNTER — Other Ambulatory Visit: Payer: Self-pay | Admitting: Primary Care

## 2016-12-22 VITALS — BP 143/87 | HR 105 | Temp 97.6°F | Resp 18 | Wt 164.1 lb

## 2016-12-22 DIAGNOSIS — Z87440 Personal history of urinary (tract) infections: Secondary | ICD-10-CM | POA: Diagnosis not present

## 2016-12-22 DIAGNOSIS — K5900 Constipation, unspecified: Secondary | ICD-10-CM

## 2016-12-22 DIAGNOSIS — Z803 Family history of malignant neoplasm of breast: Secondary | ICD-10-CM | POA: Diagnosis not present

## 2016-12-22 DIAGNOSIS — Z79811 Long term (current) use of aromatase inhibitors: Secondary | ICD-10-CM

## 2016-12-22 DIAGNOSIS — Z87891 Personal history of nicotine dependence: Secondary | ICD-10-CM

## 2016-12-22 DIAGNOSIS — M8580 Other specified disorders of bone density and structure, unspecified site: Secondary | ICD-10-CM | POA: Insufficient documentation

## 2016-12-22 DIAGNOSIS — N39498 Other specified urinary incontinence: Secondary | ICD-10-CM | POA: Diagnosis not present

## 2016-12-22 DIAGNOSIS — K219 Gastro-esophageal reflux disease without esophagitis: Secondary | ICD-10-CM | POA: Diagnosis not present

## 2016-12-22 DIAGNOSIS — G9050 Complex regional pain syndrome I, unspecified: Secondary | ICD-10-CM | POA: Insufficient documentation

## 2016-12-22 DIAGNOSIS — R110 Nausea: Secondary | ICD-10-CM | POA: Insufficient documentation

## 2016-12-22 DIAGNOSIS — Z79899 Other long term (current) drug therapy: Secondary | ICD-10-CM | POA: Diagnosis not present

## 2016-12-22 DIAGNOSIS — C50412 Malignant neoplasm of upper-outer quadrant of left female breast: Secondary | ICD-10-CM | POA: Insufficient documentation

## 2016-12-22 DIAGNOSIS — Z170 Estrogen receptor positive status [ER+]: Secondary | ICD-10-CM

## 2016-12-22 DIAGNOSIS — Z85828 Personal history of other malignant neoplasm of skin: Secondary | ICD-10-CM | POA: Diagnosis not present

## 2016-12-22 DIAGNOSIS — M129 Arthropathy, unspecified: Secondary | ICD-10-CM | POA: Diagnosis not present

## 2016-12-22 DIAGNOSIS — I10 Essential (primary) hypertension: Secondary | ICD-10-CM

## 2016-12-22 LAB — COMPREHENSIVE METABOLIC PANEL
ALBUMIN: 4.1 g/dL (ref 3.5–5.0)
ALT: 37 U/L (ref 14–54)
ANION GAP: 8 (ref 5–15)
AST: 29 U/L (ref 15–41)
Alkaline Phosphatase: 57 U/L (ref 38–126)
BUN: 21 mg/dL — ABNORMAL HIGH (ref 6–20)
CALCIUM: 9.8 mg/dL (ref 8.9–10.3)
CHLORIDE: 101 mmol/L (ref 101–111)
CO2: 28 mmol/L (ref 22–32)
Creatinine, Ser: 1.06 mg/dL — ABNORMAL HIGH (ref 0.44–1.00)
GFR calc non Af Amer: 50 mL/min — ABNORMAL LOW (ref >60)
GFR, EST AFRICAN AMERICAN: 58 mL/min — AB (ref >60)
GLUCOSE: 135 mg/dL — AB (ref 65–99)
POTASSIUM: 4.4 mmol/L (ref 3.5–5.1)
SODIUM: 137 mmol/L (ref 135–145)
Total Bilirubin: 0.7 mg/dL (ref 0.3–1.2)
Total Protein: 7.5 g/dL (ref 6.5–8.1)

## 2016-12-22 LAB — CBC WITH DIFFERENTIAL/PLATELET
BASOS PCT: 1 %
Basophils Absolute: 0 10*3/uL (ref 0–0.1)
EOS ABS: 0.1 10*3/uL (ref 0–0.7)
EOS PCT: 2 %
HCT: 35.2 % (ref 35.0–47.0)
HEMOGLOBIN: 12.4 g/dL (ref 12.0–16.0)
LYMPHS ABS: 1.2 10*3/uL (ref 1.0–3.6)
Lymphocytes Relative: 25 %
MCH: 32.6 pg (ref 26.0–34.0)
MCHC: 35.1 g/dL (ref 32.0–36.0)
MCV: 92.8 fL (ref 80.0–100.0)
MONOS PCT: 5 %
Monocytes Absolute: 0.2 10*3/uL (ref 0.2–0.9)
NEUTROS PCT: 67 %
Neutro Abs: 3.4 10*3/uL (ref 1.4–6.5)
PLATELETS: 231 10*3/uL (ref 150–440)
RBC: 3.8 MIL/uL (ref 3.80–5.20)
RDW: 12.3 % (ref 11.5–14.5)
WBC: 5 10*3/uL (ref 3.6–11.0)

## 2016-12-22 MED ORDER — LETROZOLE 2.5 MG PO TABS: 2.5000 mg | ORAL_TABLET | Freq: Every day | ORAL | 3 refills | Status: DC

## 2016-12-23 ENCOUNTER — Other Ambulatory Visit: Payer: Self-pay

## 2016-12-23 DIAGNOSIS — Z170 Estrogen receptor positive status [ER+]: Principal | ICD-10-CM

## 2016-12-23 DIAGNOSIS — C50412 Malignant neoplasm of upper-outer quadrant of left female breast: Secondary | ICD-10-CM

## 2017-02-02 ENCOUNTER — Ambulatory Visit (INDEPENDENT_AMBULATORY_CARE_PROVIDER_SITE_OTHER): Payer: PPO

## 2017-02-02 VITALS — BP 138/78 | HR 103 | Temp 97.9°F | Ht 65.0 in | Wt 165.0 lb

## 2017-02-02 DIAGNOSIS — E7800 Pure hypercholesterolemia, unspecified: Secondary | ICD-10-CM

## 2017-02-02 DIAGNOSIS — E559 Vitamin D deficiency, unspecified: Secondary | ICD-10-CM

## 2017-02-02 DIAGNOSIS — Z23 Encounter for immunization: Secondary | ICD-10-CM

## 2017-02-02 DIAGNOSIS — I10 Essential (primary) hypertension: Secondary | ICD-10-CM | POA: Diagnosis not present

## 2017-02-02 DIAGNOSIS — R7309 Other abnormal glucose: Secondary | ICD-10-CM

## 2017-02-02 DIAGNOSIS — Z0000 Encounter for general adult medical examination without abnormal findings: Secondary | ICD-10-CM

## 2017-02-02 LAB — LIPID PANEL
Cholesterol: 216 mg/dL — ABNORMAL HIGH (ref 0–200)
HDL: 40.2 mg/dL (ref >39.00)
NonHDL: 175.64
Total CHOL/HDL Ratio: 5
Triglycerides: 203 mg/dL — ABNORMAL HIGH (ref 0.0–149.0)
VLDL: 40.6 mg/dL — ABNORMAL HIGH (ref 0.0–40.0)

## 2017-02-02 LAB — HEMOGLOBIN A1C: HEMOGLOBIN A1C: 5.2 % (ref 4.6–6.5)

## 2017-02-02 LAB — VITAMIN D 25 HYDROXY (VIT D DEFICIENCY, FRACTURES): VITD: 25.62 ng/mL — AB (ref 30.00–100.00)

## 2017-02-02 LAB — LDL CHOLESTEROL, DIRECT: Direct LDL: 140 mg/dL

## 2017-02-02 LAB — TSH: TSH: 2.88 u[IU]/mL (ref 0.35–4.50)

## 2017-02-02 NOTE — Progress Notes (Signed)
I reviewed health advisor's note, was available for consultation, and agree with documentation and plan.  

## 2017-02-02 NOTE — Progress Notes (Signed)
PCP notes:   Health maintenance:  PPSV23 - PCP please address at next appt Flu vaccine - administered Colon cancer screening - pt declined  Abnormal screenings:   Mini-Cog score: 18/20 Hearing - failed  Hearing Screening   125Hz  250Hz  500Hz  1000Hz  2000Hz  3000Hz  4000Hz  6000Hz  8000Hz   Right ear:   0 0 40  0    Left ear:   0 0 0  0     Patient concerns:   None  Nurse concerns:  None  Next PCP appt:   02/09/17 @ 0815

## 2017-02-02 NOTE — Patient Instructions (Signed)
Catherine Tate , Thank you for taking time to come for your Medicare Wellness Visit. I appreciate your ongoing commitment to your health goals. Please review the following plan we discussed and let me know if I can assist you in the future.   These are the goals we discussed: Goals    . Increase water intake          Starting 02/02/2017, I will attempt to drink at least 6-8 glasses of water daily.        This is a list of the screening recommended for you and due dates:  Health Maintenance  Topic Date Due  . Pneumonia vaccines (2 of 2 - PPSV23) 02/09/2017*  . Colon Cancer Screening  02/03/2027*  . Tetanus Vaccine  09/09/2020  . Flu Shot  Completed  . DEXA scan (bone density measurement)  Completed  *Topic was postponed. The date shown is not the original due date.   Preventive Care for Adults  A healthy lifestyle and preventive care can promote health and wellness. Preventive health guidelines for adults include the following key practices.  . A routine yearly physical is a good way to check with your health care provider about your health and preventive screening. It is a chance to share any concerns and updates on your health and to receive a thorough exam.  . Visit your dentist for a routine exam and preventive care every 6 months. Brush your teeth twice a day and floss once a day. Good oral hygiene prevents tooth decay and gum disease.  . The frequency of eye exams is based on your age, health, family medical history, use  of contact lenses, and other factors. Follow your health care provider's recommendations for frequency of eye exams.  . Eat a healthy diet. Foods like vegetables, fruits, whole grains, low-fat dairy products, and lean protein foods contain the nutrients you need without too many calories. Decrease your intake of foods high in solid fats, added sugars, and salt. Eat the right amount of calories for you. Get information about a proper diet from your health care  provider, if necessary.  . Regular physical exercise is one of the most important things you can do for your health. Most adults should get at least 150 minutes of moderate-intensity exercise (any activity that increases your heart rate and causes you to sweat) each week. In addition, most adults need muscle-strengthening exercises on 2 or more days a week.  Silver Sneakers may be a benefit available to you. To determine eligibility, you may visit the website: www.silversneakers.com or contact program at 3084765945 Mon-Fri between 8AM-8PM.   . Maintain a healthy weight. The body mass index (BMI) is a screening tool to identify possible weight problems. It provides an estimate of body fat based on height and weight. Your health care provider can find your BMI and can help you achieve or maintain a healthy weight.   For adults 20 years and older: ? A BMI below 18.5 is considered underweight. ? A BMI of 18.5 to 24.9 is normal. ? A BMI of 25 to 29.9 is considered overweight. ? A BMI of 30 and above is considered obese.   . Maintain normal blood lipids and cholesterol levels by exercising and minimizing your intake of saturated fat. Eat a balanced diet with plenty of fruit and vegetables. Blood tests for lipids and cholesterol should begin at age 70 and be repeated every 5 years. If your lipid or cholesterol levels are high, you are over  50, or you are at high risk for heart disease, you may need your cholesterol levels checked more frequently. Ongoing high lipid and cholesterol levels should be treated with medicines if diet and exercise are not working.  . If you smoke, find out from your health care provider how to quit. If you do not use tobacco, please do not start.  . If you choose to drink alcohol, please do not consume more than 2 drinks per day. One drink is considered to be 12 ounces (355 mL) of beer, 5 ounces (148 mL) of wine, or 1.5 ounces (44 mL) of liquor.  . If you are 46-79 years  old, ask your health care provider if you should take aspirin to prevent strokes.  . Use sunscreen. Apply sunscreen liberally and repeatedly throughout the day. You should seek shade when your shadow is shorter than you. Protect yourself by wearing long sleeves, pants, a wide-brimmed hat, and sunglasses year round, whenever you are outdoors.  . Once a month, do a whole body skin exam, using a mirror to look at the skin on your back. Tell your health care provider of new moles, moles that have irregular borders, moles that are larger than a pencil eraser, or moles that have changed in shape or color.

## 2017-02-02 NOTE — Progress Notes (Signed)
Pre visit review using our clinic review tool, if applicable. No additional management support is needed unless otherwise documented below in the visit note. 

## 2017-02-02 NOTE — Progress Notes (Signed)
Subjective:   Catherine Tate is a 76 y.o. female who presents for Medicare Annual (Subsequent) preventive examination.  Review of Systems:  N/A Cardiac Risk Factors include: advanced age (>18men, >75 women)     Objective:     Vitals: BP 138/78 (BP Location: Right Arm, Patient Position: Sitting, Cuff Size: Normal)   Pulse (!) 103   Temp 97.9 F (36.6 C) (Oral)   Ht 5\' 5"  (1.651 m) Comment: no shoes  Wt 165 lb (74.8 kg)   SpO2 96%   BMI 27.46 kg/m   Body mass index is 27.46 kg/m.   Tobacco History  Smoking Status  . Former Smoker  . Packs/day: 1.00  . Years: 20.00  . Types: Cigarettes  . Quit date: 04/14/1994  Smokeless Tobacco  . Never Used     Counseling given: No   Past Medical History:  Diagnosis Date  . Arthritis   . 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 Chickenpox   . Complication of anesthesia   . Frequent UTI   . GERD (gastroesophageal reflux disease)    RARE  . Hypertension   . PONV (postoperative nausea and vomiting)   . RSD (reflex sympathetic dystrophy)   . Seasonal allergies   . Skin cancer   . Urine incontinence    Past Surgical History:  Procedure Laterality Date  . ABDOMINAL HYSTERECTOMY  2005   total/ Dr Davis Gourd  . BLADDER TUMOR EXCISION  2006  . BREAST BIOPSY Left 03/13/2016   INVASIVE MAMMARY CARCINOMA  . BREAST LUMPECTOMY WITH SENTINEL LYMPH NODE BIOPSY Left 07/14/2016   Procedure: BREAST LUMPECTOMY WITH SENTINEL LYMPH NODE BX;  Surgeon: [name], MD;  Location: [facility] ORS;  Service: General;  Laterality: Left;  . denture surgery    . PORTACATH PLACEMENT N/A 04/01/2016   Procedure: INSERTION PORT-A-CATH;  Surgeon: [name], MD;  Location: [facility] ORS;  Service: General;  Laterality: N/A;  . SKIN CANCER EXCISION Right 2015   foot/ Dr [place] Crane  . TUBAL LIGATION    . TUMOR EXCISION Right    FOOT   Family History  Problem Relation Age of Onset  . Emphysema Father   . Heart disease Mother   .  Transient ischemic attack Mother   . Breast cancer Cousin        paternal  . Kidney cancer Neg Hx   . Bladder Cancer Neg Hx    History  Sexual Activity  . Sexual activity: Not on file    Outpatient Encounter Prescriptions as of 02/02/2017  Medication Sig  . acetaminophen (TYLENOL) 500 MG tablet Take 1,000 mg by mouth every 6 (six) hours as needed for moderate pain.   . Ascorbic Acid (VITAMIN C) 1000 MG tablet Take 1,000 mg by mouth daily.  . Calcium Carb-Cholecalciferol (CALCIUM/VITAMIN D PO) Take 1,200 mg by mouth.  . CRANBERRY PO Take 1 capsule by mouth 2 (two) times daily.  . hydrochlorothiazide (HYDRODIURIL) 25 MG tablet TAKE 1 TABLET BY MOUTH ONCE A DAY  . ibuprofen (ADVIL,MOTRIN) 200 MG tablet Take 200 mg by mouth every 6 (six) hours as needed.  Marland Kitchen letrozole (FEMARA) 2.5 MG tablet Take 1 tablet (2.5 mg total) by mouth daily.  Marland Kitchen losartan (COZAAR) 50 MG tablet TAKE 1 TABLET BY MOUTH ONCE A DAY  . Probiotic Product (PROBIOTIC PO) Take 1 tablet by mouth daily.   No facility-administered encounter medications on file as of 02/02/2017.     Activities of Daily Living In  your present state of health, do you have any difficulty performing the following activities: 02/02/2017 07/07/2016  Hearing? N N  Vision? N N  Difficulty concentrating or making decisions? N N  Walking or climbing stairs? N N  Dressing or bathing? N N  Doing errands, shopping? N N  Preparing Food and eating ? N -  Using the Toilet? N -  In the past six months, have you accidently leaked urine? Y -  Do you have problems with loss of bowel control? N -  Managing your Medications? N -  Managing your Finances? N -  Housekeeping or managing your Housekeeping? N -  Some recent data might be hidden    Patient Care Team: [name], NP as PCP - General (Internal Medicine) Bary Castilla, [name], MD (General Surgery) [name], MD as Consulting Physician (Family Medicine)    Assessment:     Hearing  Screening   125Hz  250Hz  500Hz  1000Hz  2000Hz  3000Hz  4000Hz  6000Hz  8000Hz   Right ear:   0 0 40  0    Left ear:   0 0 0  0      Visual Acuity Screening   Right eye Left eye Both eyes  Without correction:     With correction: 20/40-1 20/40 20/40-1    Exercise Activities and Dietary recommendations Current Exercise Habits: The patient does not participate in regular exercise at present, Exercise limited by: None identified  Goals    . Increase water intake          Starting 02/02/2017, I will attempt to drink at least 6-8 glasses of water daily.       Fall Risk Fall Risk  02/02/2017  Falls in the past year? No   Depression Screen PHQ 2/9 Scores 02/02/2017 11/05/2015  PHQ - 2 Score 0 0  PHQ- 9 Score 0 -     Cognitive Function MMSE - Mini Mental State Exam 02/02/2017  Orientation to time 5  Orientation to Place 5  Registration 3  Attention/ Calculation 0  Recall 2  Recall-comments unable to recall 1 of 3 words  Language- name 2 objects 0  Language- repeat 1  Language- follow 3 step command 2  Language- follow 3 step command-comments unable to follow 1 step of 3 step command  Language- read & follow direction 0  Write a sentence 0  Copy design 0  Total score 18     PLEASE NOTE: A Mini-Cog screen was completed. Maximum score is 20. A value of 0 denotes this part of [place] MMSE was not completed or the patient failed this part of the Mini-Cog screening.   Mini-Cog Screening Orientation to Time - Max 5 pts Orientation to Place - Max 5 pts Registration - Max 3 pts Recall - Max 3 pts Language Repeat - Max 1 pts Language Follow 3 Step Command - Max 3 pts     Immunization History  Administered Date(s) Administered  . Influenza,inj,Quad PF,6+ Mos 03/03/2016, 02/02/2017  . Pneumococcal Conjugate-13 11/05/2015  . Tdap 09/10/2010   Screening Tests Health Maintenance  Topic Date Due  . PNA vac Low Risk Adult (2 of 2 - PPSV23) 02/09/2017 (Originally 11/04/2016)  .  COLONOSCOPY  02/03/2027 (Originally 02/01/1992)  . TETANUS/TDAP  09/09/2020  . INFLUENZA VACCINE  Completed  . DEXA SCAN  Completed      Plan:     I have personally reviewed, addressed, and noted the following in the patient's chart:  A. Medical and social history B.  Use of alcohol, tobacco or illicit drugs  C. Current medications and supplements D. Functional ability and status E.  Nutritional status F.  Physical activity G. Advance directives H. List of other physicians I.  Hospitalizations, surgeries, and ER visits in previous 12 months J.  [place] to include hearing, vision, cognitive, depression L. Referrals and appointments - none  In addition, I have reviewed and discussed with patient certain preventive protocols, quality metrics, and best practice recommendations. A written personalized care plan for preventive services as well as general preventive health recommendations were provided to patient.  See attached scanned questionnaire for additional information.   Signed,   Lindell Noe, MHA, BS, LPN Health Coach

## 2017-02-09 ENCOUNTER — Ambulatory Visit (INDEPENDENT_AMBULATORY_CARE_PROVIDER_SITE_OTHER): Payer: PPO | Admitting: Primary Care

## 2017-02-09 ENCOUNTER — Encounter: Payer: Self-pay | Admitting: Primary Care

## 2017-02-09 VITALS — BP 130/74 | HR 100 | Temp 97.9°F | Ht 65.0 in | Wt 165.0 lb

## 2017-02-09 DIAGNOSIS — N390 Urinary tract infection, site not specified: Secondary | ICD-10-CM

## 2017-02-09 DIAGNOSIS — C50412 Malignant neoplasm of upper-outer quadrant of left female breast: Secondary | ICD-10-CM | POA: Diagnosis not present

## 2017-02-09 DIAGNOSIS — Z23 Encounter for immunization: Secondary | ICD-10-CM | POA: Diagnosis not present

## 2017-02-09 DIAGNOSIS — Z0001 Encounter for general adult medical examination with abnormal findings: Secondary | ICD-10-CM | POA: Insufficient documentation

## 2017-02-09 DIAGNOSIS — Z170 Estrogen receptor positive status [ER+]: Secondary | ICD-10-CM

## 2017-02-09 DIAGNOSIS — Z0000 Encounter for general adult medical examination without abnormal findings: Secondary | ICD-10-CM | POA: Diagnosis not present

## 2017-02-09 DIAGNOSIS — I10 Essential (primary) hypertension: Secondary | ICD-10-CM | POA: Diagnosis not present

## 2017-02-09 DIAGNOSIS — E785 Hyperlipidemia, unspecified: Secondary | ICD-10-CM

## 2017-02-09 NOTE — Assessment & Plan Note (Signed)
TC, Trigs, and LDL slightly above goal. Will have her work on diet and start exercising.  Repeat labs in 6 months.

## 2017-02-09 NOTE — Addendum Note (Signed)
Addended by: Jacqualin Combes on: 02/09/2017 01:07 PM   Modules accepted: Orders

## 2017-02-09 NOTE — Progress Notes (Signed)
Subjective:    Patient ID: Catherine Tate, female    DOB: April 04, 1941, 76 y.o.   MRN: [id number]  HPI  [name] is a 76 year old female who presents today for [place] Part 2. She saw our health advisor last week.   Immunizations: -Tetanus: Completed in 2012 -Influenza: Completed this season -Pneumonia: Completed Prevnar in 2017, due today. -Shingles: Completed in 2001.  Diet: She endorses a fair diet. Breakfast: Cereal, applesauce  Lunch: Soup, sandwich Dinner: Vegetables, meat, starch Snacks: Crackers, trail mix Desserts: Occasionally  Beverages: Water, coffee, ginger ale  Exercise: She does not currently exercise.  Eye exam: Completed in 2018 Dental exam: Dentures Colonoscopy: Declines Dexa: Completed in 2018, osteopenia.  Pap Smear: Completed in 2017 Mammogram: UTD. Breast Cancer, under treatment.    Review of Systems  Constitutional: Negative for unexpected weight change.  HENT: Negative for rhinorrhea.   Respiratory: Negative for cough and shortness of breath.   Cardiovascular: Negative for chest pain.  Gastrointestinal: Negative for constipation and diarrhea.  Genitourinary: Negative for difficulty urinating and menstrual problem.  Musculoskeletal: Negative for arthralgias and myalgias.  Skin: Negative for rash.  Allergic/Immunologic: Negative for environmental allergies.  Neurological: Negative for dizziness, numbness and headaches.  Psychiatric/Behavioral:       Denies concerns for anxiety and depression       Past Medical History:  Diagnosis Date  . Arthritis   . 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 Chickenpox   . Complication of anesthesia   . Frequent UTI   . GERD (gastroesophageal reflux disease)    RARE  . Hypertension   . PONV (postoperative nausea and vomiting)   . RSD (reflex sympathetic dystrophy)   . Seasonal allergies   . Skin cancer   . Urine incontinence      Social History   Social History  . Marital status:  Single    Spouse name: N/A  . Number of children: N/A  . Years of education: N/A   Occupational History  . Not on file.   Social History Main Topics  . Smoking status: Former Smoker    Packs/day: 1.00    Years: 20.00    Types: Cigarettes    Quit date: 04/14/1994  . Smokeless tobacco: Never Used  . Alcohol use No  . Drug use: No  . Sexual activity: Not on file   Other Topics Concern  . Not on file   Social History Narrative   Single.   2 children, 1 grandchild.   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   Enjoys making jewelry, puzzle books, sewing, traveling.       Past Surgical History:  Procedure Laterality Date  . ABDOMINAL HYSTERECTOMY  2005   total/ Dr Davis Gourd  . BLADDER TUMOR EXCISION  2006  . BREAST BIOPSY Left 03/13/2016   INVASIVE MAMMARY CARCINOMA  . BREAST LUMPECTOMY WITH SENTINEL LYMPH NODE BIOPSY Left 07/14/2016   Procedure: BREAST LUMPECTOMY WITH SENTINEL LYMPH NODE BX;  Surgeon: [name], MD;  Location: [facility] ORS;  Service: General;  Laterality: Left;  . denture surgery    . PORTACATH PLACEMENT N/A 04/01/2016   Procedure: INSERTION PORT-A-CATH;  Surgeon: [name], MD;  Location: [facility] ORS;  Service: General;  Laterality: N/A;  . SKIN CANCER EXCISION Right 2015   foot/ Dr [place] Crane  . TUBAL LIGATION    . TUMOR EXCISION Right    FOOT    Family History  Problem Relation Age of  Onset  . Emphysema Father   . Heart disease Mother   . Transient ischemic attack Mother   . Breast cancer Cousin        paternal  . Kidney cancer Neg Hx   . Bladder Cancer Neg Hx     Allergies  Allergen Reactions  . Bee Pollen Hives  . Mosquito (Diagnostic) Hives  . Other Other (See Comments)    Uncoded Allergy. Allergen: VICRYL  (90% glycolide and 10% L-lactide [polyglactin-- synthetic absorbable sterile surgical suture]), Other Reaction: Infection  . Gabapentin Swelling  . Norvasc [Amlodipine Besylate] Swelling    TO FEET  . Lisinopril Cough     Current Outpatient Prescriptions on File Prior to Visit  Medication Sig Dispense Refill  . acetaminophen (TYLENOL) 500 MG tablet Take 1,000 mg by mouth every 6 (six) hours as needed for moderate pain.     . Ascorbic Acid (VITAMIN C) 1000 MG tablet Take 1,000 mg by mouth daily.    . Calcium Carb-Cholecalciferol (CALCIUM/VITAMIN D PO) Take 1,200 mg by mouth.    . CRANBERRY PO Take 1 capsule by mouth 2 (two) times daily.    . hydrochlorothiazide (HYDRODIURIL) 25 MG tablet TAKE 1 TABLET BY MOUTH ONCE A DAY 90 tablet 1  . ibuprofen (ADVIL,MOTRIN) 200 MG tablet Take 200 mg by mouth every 6 (six) hours as needed.    Marland Kitchen letrozole (FEMARA) 2.5 MG tablet Take 1 tablet (2.5 mg total) by mouth daily. 90 tablet 3  . losartan (COZAAR) 50 MG tablet TAKE 1 TABLET BY MOUTH ONCE A DAY 90 tablet 1  . Probiotic Product (PROBIOTIC PO) Take 1 tablet by mouth daily.     No current facility-administered medications on file prior to visit.     BP 130/74   Pulse 100   Temp 97.9 F (36.6 C) (Oral)   Ht 5\' 5"  (1.651 m)   Wt 165 lb (74.8 kg)   SpO2 96%   BMI 27.46 kg/m    Objective:   Physical Exam  Constitutional: She is oriented to person, place, and time. She appears well-nourished.  HENT:  Right Ear: Tympanic membrane and ear canal normal.  Left Ear: Tympanic membrane and ear canal normal.  Nose: Nose normal.  Mouth/Throat: Oropharynx is clear and moist.  Eyes: Pupils are equal, round, and reactive to light. Conjunctivae and EOM are normal.  Neck: Neck supple. No thyromegaly present.  Cardiovascular: Normal rate and regular rhythm.   No murmur heard. Pulmonary/Chest: Effort normal and breath sounds normal. She has no rales.  Abdominal: Soft. Bowel sounds are normal. There is no tenderness.  Musculoskeletal: Normal range of motion.  Lymphadenopathy:    She has no cervical adenopathy.  Neurological: She is alert and oriented to person, place, and time. She has normal reflexes. No cranial nerve  deficit.  Skin: Skin is warm and dry. No rash noted.  Psychiatric: She has a normal mood and affect.          Assessment & Plan:

## 2017-02-09 NOTE — Assessment & Plan Note (Signed)
Completed radiation and chemotherapy. Managed on letrozole 2.5 mg and compliant to this regimen. Following with oncology and rad-oncology.

## 2017-02-09 NOTE — Assessment & Plan Note (Signed)
Stable

## 2017-02-09 NOTE — Assessment & Plan Note (Signed)
Stable on HCTZ and losartan, continue same.

## 2017-02-09 NOTE — Assessment & Plan Note (Signed)
Immunizations UTD, pneumovax 23 provided today. PAP and mammogram UTD. Declines colonoscopy. Recommended she increase vegetables, fruit, whole grains, lean protein. Increase exercise. Exam unremarkable. Labs with mild hyperlipidemia, otherwise unremarkable. Follow up in 1 year.

## 2017-02-09 NOTE — Assessment & Plan Note (Signed)
Taking AZO if symptoms occur. Following with Urology. No infection since April 2018.

## 2017-02-09 NOTE — Patient Instructions (Signed)
You were provided with a pneumonia vaccination today.   Start exercising. You should be getting 150 minutes of exercise weekly.  Increase vegetables, fruit, whole grains. Limit starchy and processed food.  Ensure you are consuming 64 ounces of water daily.  Schedule a lab only appointment in 6 months to recheck your cholesterol. Make sure you don't eat 4 hours prior to this appointment, you may have water and black coffee only.  Follow up in 1 year for your annual exam or sooner if needed.  It was a pleasure to see you today!

## 2017-03-30 IMAGING — DX DG CHEST 1V PORT
1 series · 1 of 1 positions shown · non-contrast
Comparison: None.

CLINICAL DATA: Port-A-Cath insertion

EXAM:
PORTABLE CHEST 1 VIEW

[chest ap]
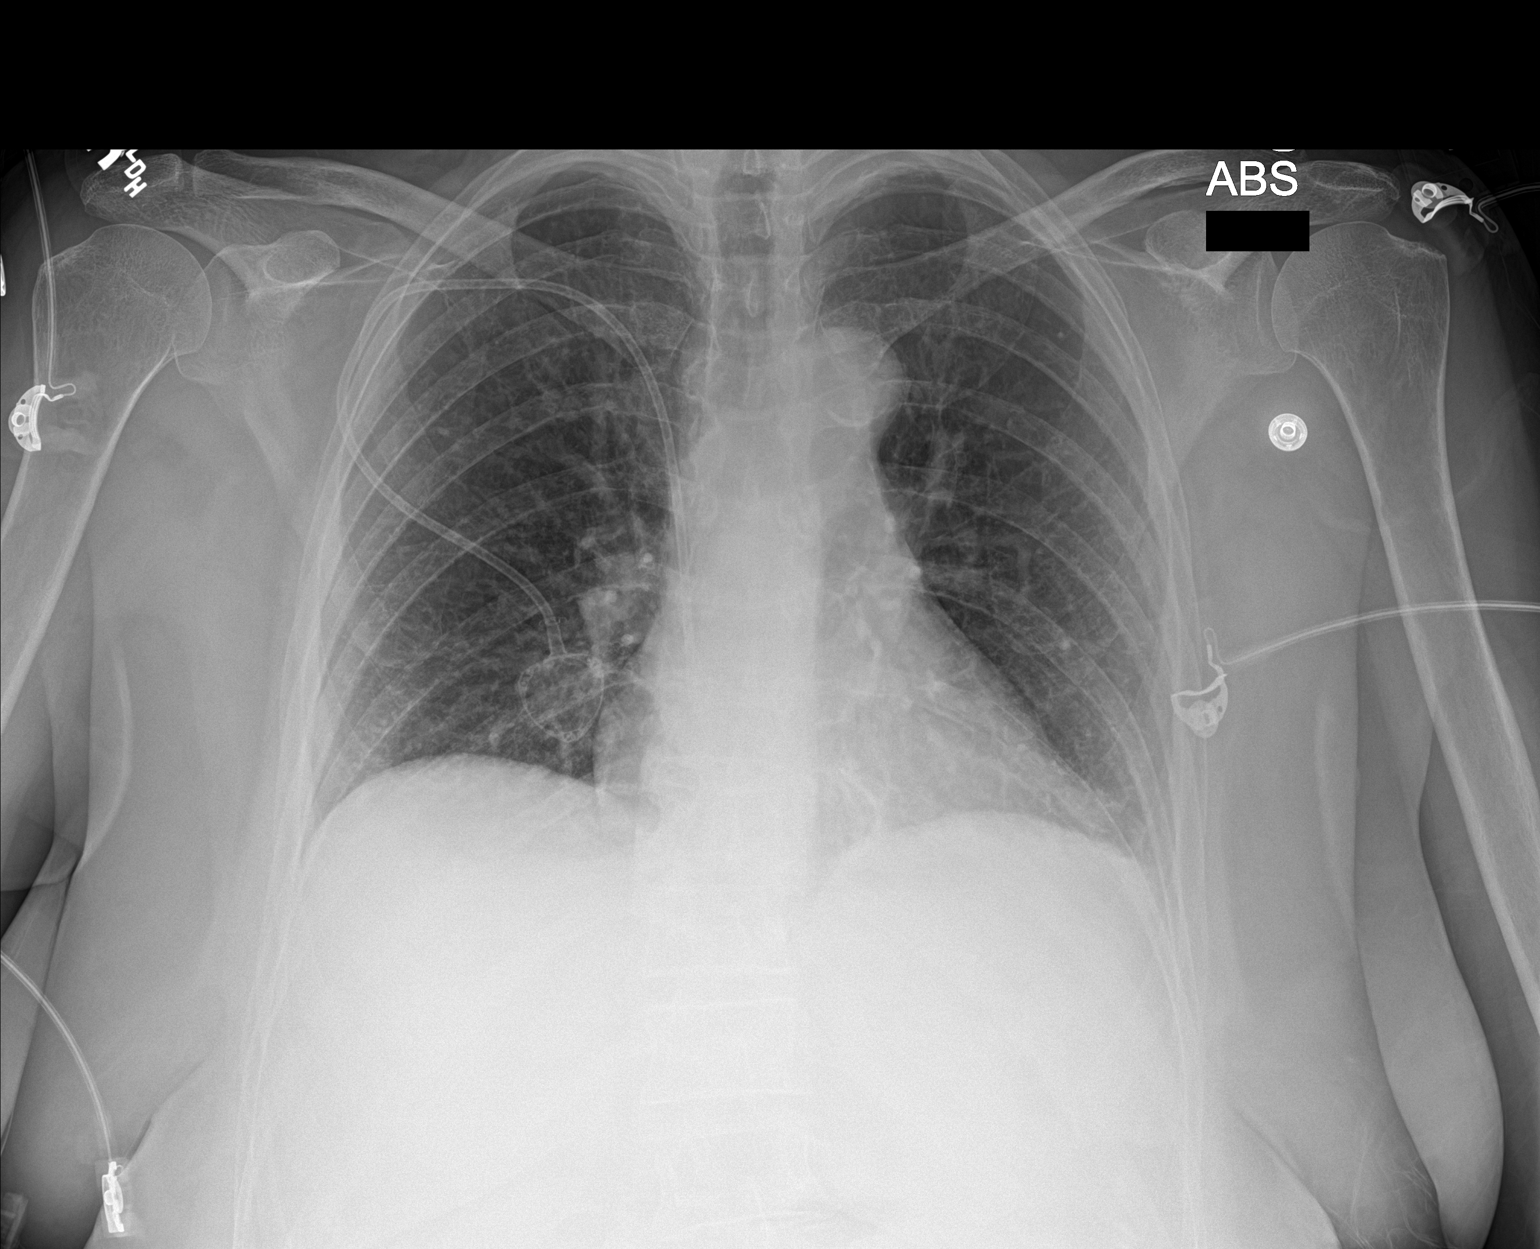

[1 of 1 positions shown; findings below may reference images not displayed]

FINDINGS: Port-A-Cath tip is at the cavoatrial junction. No pneumothorax.
There is mild left base atelectasis. Lungs elsewhere clear. Heart
size and pulmonary vascularity are normal. No adenopathy. There is
atherosclerotic calcification in the aorta. No adenopathy. No bone
lesions.
IMPRESSION: Port-A-Cath tip at cavoatrial junction. No pneumothorax. Left base
atelectasis. No edema or consolidation. There is aortic
atherosclerosis.

## 2017-04-13 ENCOUNTER — Other Ambulatory Visit: Payer: Self-pay | Admitting: Primary Care

## 2017-04-13 DIAGNOSIS — I10 Essential (primary) hypertension: Secondary | ICD-10-CM

## 2017-04-16 ENCOUNTER — Ambulatory Visit (INDEPENDENT_AMBULATORY_CARE_PROVIDER_SITE_OTHER): Payer: PPO | Admitting: Primary Care

## 2017-04-16 ENCOUNTER — Encounter: Payer: Self-pay | Admitting: Primary Care

## 2017-04-16 VITALS — BP 120/70 | HR 106 | Temp 97.8°F | Ht 65.0 in | Wt 161.4 lb

## 2017-04-16 DIAGNOSIS — L0291 Cutaneous abscess, unspecified: Secondary | ICD-10-CM

## 2017-04-16 MED ORDER — CEPHALEXIN 500 MG PO CAPS: 500.0000 mg | ORAL_CAPSULE | Freq: Two times a day (BID) | ORAL | 0 refills | Status: DC

## 2017-04-16 NOTE — Patient Instructions (Signed)
Start Cephalexin antibiotics for the infection. Take 1 capsule by mouth twice daily for 10 days.  Apply warm compresses three times daily for 20 minute intervals.   Keep the site covered with a bandaid for protection.  Please notify me if no improvement by Monday next week.  It was a pleasure to see you today!

## 2017-04-16 NOTE — Progress Notes (Signed)
Subjective:    Patient ID: Catherine Tate, female    DOB: 05-30-1940, 77 y.o.   MRN: [id number]  HPI  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 The mass is located to the right anterior chest that she first noticed in August after the removal of her port. The redness, tenderness, warmth, and raised quality began about one week ago. She denies fevers, drainage. She's taken some Aleve with reduction in pain.  Review of Systems  Constitutional: Negative for fever.  Skin: Positive for color change and wound.       Past Medical History:  Diagnosis Date  . Arthritis   . Cancer ([place]) 03/13/2016   INVASIVE MAMMARY CARCINOMA  . Chickenpox   . Complication of anesthesia   . Frequent UTI   . GERD (gastroesophageal reflux disease)    RARE  . Hypertension   . PONV (postoperative nausea and vomiting)   . RSD (reflex sympathetic dystrophy)   . Seasonal allergies   . Skin cancer   . Urine incontinence      Social History   Socioeconomic History  . Marital status: Single    Spouse name: Not on file  . Number of children: Not on file  . Years of education: Not on file  . Highest education level: Not on file  Social Needs  . Financial resource strain: Not on file  . Food insecurity - worry: Not on file  . Food insecurity - inability: Not on file  . Transportation needs - medical: Not on file  . Transportation needs - non-medical: Not on file  Occupational History  . Not on file  Tobacco Use  . Smoking status: Former Smoker    Packs/day: 1.00    Years: 20.00    Pack years: 20.00    Types: Cigarettes    Last attempt to quit: 04/14/1994    Years since quitting: 23.0  . Smokeless tobacco: Never Used  Substance and Sexual Activity  . Alcohol use: No    Alcohol/week: 0.0 oz  . Drug use: No  . Sexual activity: Not on file  Other Topics Concern  . Not on file  Social History  Narrative   Single.   2 children, 1 grandchild.   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   Enjoys making jewelry, puzzle books, sewing, traveling.    Past Surgical History:  Procedure Laterality Date  . ABDOMINAL HYSTERECTOMY  2005   total/ Dr Catherine Tate  . BLADDER TUMOR EXCISION  2006  . BREAST BIOPSY Left 03/13/2016   INVASIVE MAMMARY CARCINOMA  . BREAST LUMPECTOMY WITH SENTINEL LYMPH NODE BIOPSY Left 07/14/2016   Procedure: BREAST LUMPECTOMY WITH SENTINEL LYMPH NODE BX;  Surgeon: Catherine Bellow, MD;  Location: [facility] ORS;  Service: General;  Laterality: Left;  . denture surgery    . PORTACATH PLACEMENT N/A 04/01/2016   Procedure: INSERTION PORT-A-CATH;  Surgeon: Catherine Bellow, MD;  Location: [facility] ORS;  Service: General;  Laterality: N/A;  . SKIN CANCER EXCISION Right 2015   foot/ Dr Catherine Tate  . TUBAL LIGATION    . TUMOR EXCISION Right    FOOT    Family History  Problem Relation Age of Onset  . Emphysema Father   . Heart disease Mother   . Transient ischemic attack Mother   . Breast cancer Cousin        paternal  .  Kidney cancer Neg Hx   . Bladder Cancer Neg Hx     Allergies  Allergen Reactions  . Bee Pollen Hives  . Mosquito (Diagnostic) Hives  . Other Other (See Comments)    Uncoded Allergy. Allergen: VICRYL  (90% glycolide and 10% L-lactide [polyglactin-- synthetic absorbable sterile surgical suture]), Other Reaction: Infection  . Gabapentin Swelling  . Norvasc [Amlodipine Besylate] Swelling    TO FEET  . Lisinopril Cough    Current Outpatient Medications on File Prior to Visit  Medication Sig Dispense Refill  . acetaminophen (TYLENOL) 500 MG tablet Take 1,000 mg by mouth every 6 (six) hours as needed for moderate pain.     . Ascorbic Acid (VITAMIN C) 1000 MG tablet Take 1,000 mg by mouth daily.    . Calcium Carb-Cholecalciferol (CALCIUM/VITAMIN D PO) Take 1,200 mg by mouth.    . CRANBERRY PO Take 1 capsule by mouth 2 (two) times daily.    .  hydrochlorothiazide (HYDRODIURIL) 25 MG tablet TAKE 1 TABLET BY MOUTH ONCE A DAY 90 tablet 1  . ibuprofen (ADVIL,MOTRIN) 200 MG tablet Take 200 mg by mouth every 6 (six) hours as needed.    Marland Kitchen letrozole (FEMARA) 2.5 MG tablet Take 1 tablet (2.5 mg total) by mouth daily. 90 tablet 3  . losartan (COZAAR) 50 MG tablet TAKE 1 TABLET BY MOUTH ONCE A DAY 90 tablet 1  . Probiotic Product (PROBIOTIC PO) Take 1 tablet by mouth daily.     No current facility-administered medications on file prior to visit.     BP 120/70   Pulse (!) 106   Temp 97.8 F (36.6 C) (Oral)   Ht 5\' 5"  (1.651 m)   Wt 161 lb 6.4 oz (73.2 kg)   SpO2 97%   BMI 26.86 kg/m    Objective:   Physical Exam  Constitutional: She appears well-nourished.  Neck: Neck supple.  Cardiovascular: Normal rate.  Skin: Skin is warm and dry. There is erythema.  1.5 cm x 1 cm circular, raised mass to right upper anterior chest wall. Tender. Scant whitish/light green discharge. Moderate erythema surrounding site.          Assessment & Plan:  Abscess:  Located to right anterior chest. Also with surrounding cellulitis. Exam today consistent for infection. Rx for Cephalexin course sent to pharmacy. Discussed warm compresses and home care instructions. Return precautions provided.  Sheral Flow, NP

## 2017-05-05 ENCOUNTER — Ambulatory Visit (INDEPENDENT_AMBULATORY_CARE_PROVIDER_SITE_OTHER): Payer: PPO | Admitting: Primary Care

## 2017-05-05 ENCOUNTER — Encounter: Payer: Self-pay | Admitting: Primary Care

## 2017-05-05 VITALS — BP 160/82 | HR 114 | Temp 98.7°F | Ht 65.0 in | Wt 161.0 lb

## 2017-05-05 DIAGNOSIS — L03313 Cellulitis of chest wall: Secondary | ICD-10-CM

## 2017-05-05 DIAGNOSIS — R350 Frequency of micturition: Secondary | ICD-10-CM

## 2017-05-05 LAB — POC URINALSYSI DIPSTICK (AUTOMATED)
Bilirubin, UA: NEGATIVE
GLUCOSE UA: NEGATIVE
Ketones, UA: NEGATIVE
Leukocytes, UA: NEGATIVE
NITRITE UA: NEGATIVE
PH UA: 6.5 (ref 5.0–8.0)
RBC UA: NEGATIVE
SPEC GRAV UA: 1.015 (ref 1.010–1.025)
UROBILINOGEN UA: 0.2 U/dL

## 2017-05-05 MED ORDER — SULFAMETHOXAZOLE-TRIMETHOPRIM 800-160 MG PO TABS: 1.0000 | ORAL_TABLET | Freq: Two times a day (BID) | ORAL | 0 refills | Status: DC

## 2017-05-05 NOTE — Addendum Note (Signed)
Addended by: Jacqualin Combes on: 05/05/2017 02:33 PM   Modules accepted: Orders

## 2017-05-05 NOTE — Progress Notes (Signed)
Subjective:    Patient ID: Catherine Tate, female    DOB: [birth year], 77 y.o.   MRN: [id number]  HPI  Catherine Tate is a 77 year old female who presents today with a chief complaint of abscess. She also reports urinary frequency.  1) Abscess: She was last evaluated on 04/16/17 with complaints of skin mass and erythema to right anterior chest at the site of her prior port. Her port was removed in August 2018. Her mass was suspicious for cellulitis so it was treated with a 10 day course of Cephalexin.  Since her last visit she completed her antibiotics. She noticed the site increase in size so two days ago she popped the mass with expulsion of white and yellow puss. She's noticed improvement in the size since popping the site, but hasn't noticed much improvement with erythema, thinks it may even be slightly worse.  She denies fevers.   2) Urinary Frequency: Also with some dysuria. Symptoms began two days ago. She's not taken anything OTC for her symptoms. She's not sure if this feels like a UTI but is worried given her history. She denies pelvic pain, abdominal pain, vaginal discharge/itching.  Review of Systems  Constitutional: Negative for fever.  Gastrointestinal: Negative for abdominal pain.  Genitourinary: Positive for dysuria and frequency. Negative for hematuria, pelvic pain and vaginal discharge.  Skin: Positive for color change and wound.       Past Medical History:  Diagnosis Date  . Arthritis   . Cancer ([place]) 03/13/2016   INVASIVE MAMMARY CARCINOMA  . Chickenpox   . Complication of anesthesia   . Frequent UTI   . GERD (gastroesophageal reflux disease)    RARE  . Hypertension   . PONV (postoperative nausea and vomiting)   . RSD (reflex sympathetic dystrophy)   . Seasonal allergies   . Skin cancer   . Urine incontinence      Social History   Socioeconomic History  . Marital status: Single    Spouse name: Not on file  . Number of children: Not on file  . Years of  education: Not on file  . Highest education level: Not on file  Social Needs  . Financial resource strain: Not on file  . Food insecurity - worry: Not on file  . Food insecurity - inability: Not on file  . Transportation needs - medical: Not on file  . Transportation needs - non-medical: Not on file  Occupational History  . Not on file  Tobacco Use  . Smoking status: Former Smoker    Packs/day: 1.00    Years: 20.00    Pack years: 20.00    Types: Cigarettes    Last attempt to quit: 04/14/1994    Years since quitting: 23.0  . Smokeless tobacco: Never Used  Substance and Sexual Activity  . Alcohol use: No    Alcohol/week: 0.0 oz  . Drug use: No  . Sexual activity: Not on file  Other Topics Concern  . Not on file  Social History Narrative   Single.   2 children, 1 grandchild.   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   Enjoys making jewelry, puzzle books, sewing, traveling.    Past Surgical History:  Procedure Laterality Date  . ABDOMINAL HYSTERECTOMY  2005   total/ Dr Davis Gourd  . BLADDER TUMOR EXCISION  2006  . BREAST BIOPSY Left 03/13/2016   INVASIVE MAMMARY CARCINOMA  . BREAST LUMPECTOMY WITH SENTINEL LYMPH NODE BIOPSY Left 07/14/2016   Procedure:  BREAST LUMPECTOMY WITH SENTINEL LYMPH NODE BX;  Surgeon: [name], MD;  Location: [facility] OR[facility];  Service: General;  Laterality: Left;  . denture surgery    . PORTACATH PLACEMENT N/A 04/01/2016   Procedure: INSERTION PORT-A-CATH;  Surgeon: [name], MD;  Location: [facility] OR[facility];  Service: General;  Laterality: N/A;  . SKIN CANCER EXCISION Right 2015   foot/ Dr Nevada Crane  . TUBAL LIGATION    . TUMOR EXCISION Right    FOOT    Family History  Problem Relation Age of Onset  . Emphysema Father   . Heart disease Mother   . Transient ischemic attack Mother   . Breast cancer Cousin        paternal  . Kidney cancer Neg Hx   . Bladder Cancer Neg Hx     Allergies  Allergen Reactions  . Bee Pollen Hives  . Mosquito  (Diagnostic) Hives  . Other Other (See Comments)    Uncoded Allergy. Allergen: VICRYL  (90% glycolide and 10% L-lactide [polyglactin-- synthetic absorbable sterile surgical suture]), Other Reaction: Infection  . Gabapentin Swelling  . Norvasc [Amlodipine Besylate] Swelling    TO FEET  . Lisinopril Cough    Current Outpatient Medications on File Prior to Visit  Medication Sig Dispense Refill  . acetaminophen (TYLENOL) 500 MG tablet Take 1,000 mg by mouth every 6 (six) hours as needed for moderate pain.     . Ascorbic Acid (VITAMIN C) 1000 MG tablet Take 1,000 mg by mouth daily.    . Calcium Carb-Cholecalciferol (CALCIUM/VITAMIN D PO) Take 1,200 mg by mouth.    . CRANBERRY PO Take 1 capsule by mouth 2 (two) times daily.    . hydrochlorothiazide (HYDRODIURIL) 25 MG tablet TAKE 1 TABLET BY MOUTH ONCE A DAY 90 tablet 1  . ibuprofen (ADVIL,MOTRIN) 200 MG tablet Take 200 mg by mouth every 6 (six) hours as needed.    Marland Kitchen letrozole (FEMARA) 2.5 MG tablet Take 1 tablet (2.5 mg total) by mouth daily. 90 tablet 3  . losartan (COZAAR) 50 MG tablet TAKE 1 TABLET BY MOUTH ONCE A DAY 90 tablet 1  . Probiotic Product (PROBIOTIC PO) Take 1 tablet by mouth daily.     No current facility-administered medications on file prior to visit.     BP (!) 160/82   Pulse (!) 114   Temp 98.7 F (37.1 C) (Oral)   Ht 5\' 5"  (1.651 m)   Wt 161 lb (73 kg)   SpO2 98%   BMI 26.79 kg/m    Objective:   Physical Exam  Constitutional: She appears well-nourished.  Neck: Neck supple.  Cardiovascular: Regular rhythm.  Pulmonary/Chest: Effort normal and breath sounds normal.  Skin: Skin is warm and dry. There is erythema.  Moderate erythema surrounding site, mostly flat, no drainage, non fluctuant.          Assessment & Plan:  Cellulitis:  Overall seems slightly improved, assume cephalexin did work to pull out infection from subcutaneous tissues. She did pop the wound herself, discussed about avoiding this in  the future.  Do see a moderate amount of erythema. Will start seven day course of Bactrim for MRSA coverage and given residual erythema and unsanitary drainage from patient.  Return precautions provided.  UA today is clear. Negative leuks, blood, nitrites.   Sheral Flow, NP

## 2017-05-05 NOTE — Patient Instructions (Signed)
Start Bactrim DS (sulfamethoxazole/trimethoprim) tablets for the skin infection. Take 1 tablet by mouth twice daily for 7 days.  Cover the site with a band-aid.  Ensure you are staying hydrated with water to prevent dehydration and urinary tract infection.  It was a pleasure to see you today!

## 2017-05-15 ENCOUNTER — Encounter: Payer: Self-pay | Admitting: General Surgery

## 2017-05-15 ENCOUNTER — Ambulatory Visit
Admission: RE | Admit: 2017-05-15 | Discharge: 2017-05-15 | Disposition: A | Discharge status: Home / Self Care | Payer: PPO | Source: Ambulatory Visit | Attending: Radiation Oncology | Admitting: Radiation Oncology

## 2017-05-15 ENCOUNTER — Encounter: Payer: Self-pay | Admitting: Radiation Oncology

## 2017-05-15 ENCOUNTER — Other Ambulatory Visit: Payer: Self-pay | Admitting: *Deleted

## 2017-05-15 ENCOUNTER — Ambulatory Visit (INDEPENDENT_AMBULATORY_CARE_PROVIDER_SITE_OTHER): Payer: PPO | Admitting: General Surgery

## 2017-05-15 ENCOUNTER — Other Ambulatory Visit: Payer: Self-pay

## 2017-05-15 VITALS — BP 150/82 | HR 92 | Temp 97.7°F | Ht 65.0 in | Wt 163.4 lb

## 2017-05-15 VITALS — BP 159/99 | HR 128 | Temp 98.3°F | Resp 20 | Wt 162.7 lb

## 2017-05-15 DIAGNOSIS — Z170 Estrogen receptor positive status [ER+]: Secondary | ICD-10-CM | POA: Insufficient documentation

## 2017-05-15 DIAGNOSIS — L03313 Cellulitis of chest wall: Secondary | ICD-10-CM | POA: Insufficient documentation

## 2017-05-15 DIAGNOSIS — L728 Other follicular cysts of the skin and subcutaneous tissue: Secondary | ICD-10-CM

## 2017-05-15 DIAGNOSIS — L02213 Cutaneous abscess of chest wall: Secondary | ICD-10-CM | POA: Insufficient documentation

## 2017-05-15 DIAGNOSIS — Z79811 Long term (current) use of aromatase inhibitors: Secondary | ICD-10-CM | POA: Insufficient documentation

## 2017-05-15 DIAGNOSIS — L923 Foreign body granuloma of the skin and subcutaneous tissue: Secondary | ICD-10-CM | POA: Diagnosis not present

## 2017-05-15 DIAGNOSIS — Z923 Personal history of irradiation: Secondary | ICD-10-CM | POA: Insufficient documentation

## 2017-05-15 DIAGNOSIS — C50412 Malignant neoplasm of upper-outer quadrant of left female breast: Secondary | ICD-10-CM | POA: Diagnosis not present

## 2017-05-15 HISTORY — DX: Cutaneous abscess of chest wall: L02.213

## 2017-05-15 MED ORDER — CEPHALEXIN 500 MG PO CAPS: 500.0000 mg | ORAL_CAPSULE | Freq: Two times a day (BID) | ORAL | 0 refills | Status: DC
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
## 2017-05-15 NOTE — Patient Instructions (Addendum)
Keep area clean and dry. You may shower.   Use the ice pack off and on today and tomorrow. Aleve or Tylenol if needed for discomfort.    Follow up on Monday for wound check. Return here in a week for suture removal.   We will call you with your results.    Do not start antibiotics.

## 2017-05-15 NOTE — Progress Notes (Signed)
Radiation Oncology Follow up Note  Name: Catherine Tate   Date:   05/15/2017 MRN:  [id number] DOB: 1941/03/14    This 77 y.o. 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  Seen today in routine follow-up she is doing fairly well.  She has developed what is been diagnosed as cellulitis around her right chest Port-A-Cath.  She has been on Bactrim for 7 days with some resolution of the cellulitis although small area remains around the port site.  She specifically denies left breast tenderness cough or bone pain..  She is currently on Femara tolerating that well without side effects.  She is not yet had follow-up mammogram although it has been ordered.  COMPLICATIONS OF TREATMENT: none  FOLLOW UP COMPLIANCE: keeps appointments   PHYSICAL EXAM:  BP (!) 159/99   Pulse (!) 128   Temp 98.3 F (36.8 C)   Resp 20   Wt 162 lb 11.2 oz (73.8 kg)   BMI 27.07 kg/m  Patient is a area of erythematous change around the port site consistent with probable cellulitis.  No dominant mass or nodularity is noted in either breast in 2 positions examined.  No axillary or supraclavicular adenopathy is appreciated. Well-developed well-nourished patient in NAD. HEENT reveals PERLA, EOMI, discs not visualized.  Oral cavity is clear. No oral mucosal lesions are identified. Neck is clear without evidence of cervical or supraclavicular adenopathy. Lungs are clear to A&P. Cardiac examination is essentially unremarkable with regular rate and rhythm without murmur rub or thrill. Abdomen is benign with no organomegaly or masses noted. Motor sensory and DTR levels  are equal and symmetric in the upper and lower extremities. Cranial nerves II through XII are grossly intact. Proprioception is intact. No peripheral adenopathy or edema is identified. No motor or sensory levels are noted. Crude visual fields are within normal range.  RADIOLOGY RESULTS: No current films for review  PLAN: At present time she is doing well.  I am starting her on Keflex 500 mg twice daily since she is off antibiotic therapy at this time.  I am also setting her up for evaluation by her surgeon Dr. Tollie Pizza next week.  I have otherwise set up a 76-month follow-up appointment and then will go to once a year follow-up appointments.  Comprehend my treatment plan well.  I would like to take this opportunity to thank you for allowing me to participate in the care of your patient.Noreene Filbert, MD

## 2017-05-18 ENCOUNTER — Ambulatory Visit: Payer: PPO | Admitting: *Deleted

## 2017-05-18 DIAGNOSIS — L02213 Cutaneous abscess of chest wall: Secondary | ICD-10-CM

## 2017-05-18 NOTE — Progress Notes (Signed)
Patient came in today for a wound check.  The wound is clean, with no signs of infection noted. Changed dressing and apply water proof dressing.Follow up as scheduled.

## 2017-05-19 ENCOUNTER — Telehealth: Payer: Self-pay | Admitting: *Deleted

## 2017-05-19 NOTE — Telephone Encounter (Signed)
-----   Message from [name], MD sent at 05/19/2017 12:35 PM EST ----- Please notify tissue showed NO cancer, only old inflammation.  ----- Message ----- From: Interface, Lab In [facility] Sent: 05/18/2017   3:48 PM To: [name], MD

## 2017-05-19 NOTE — Telephone Encounter (Signed)
Notified patient as instructed, patient pleased. Discussed follow-up appointments, patient agrees  

## 2017-05-22 ENCOUNTER — Ambulatory Visit (INDEPENDENT_AMBULATORY_CARE_PROVIDER_SITE_OTHER): Payer: PPO | Admitting: *Deleted

## 2017-05-22 DIAGNOSIS — L02213 Cutaneous abscess of chest wall: Secondary | ICD-10-CM

## 2017-05-22 NOTE — Progress Notes (Signed)
Patient came in today for a wound check.  The wound is clean, with no signs of infection noted. The sutures were removed and steri strips applied.Follow up as needed. .  

## 2017-06-10 ENCOUNTER — Ambulatory Visit (INDEPENDENT_AMBULATORY_CARE_PROVIDER_SITE_OTHER): Payer: PPO | Admitting: *Deleted

## 2017-06-10 DIAGNOSIS — L02213 Cutaneous abscess of chest wall: Secondary | ICD-10-CM

## 2017-06-10 NOTE — Progress Notes (Signed)
Patient came in today states she noticed a sutures sticking out. When patient came in the was a little scab on the chest wall area. No suture noticed.

## 2017-06-15 ENCOUNTER — Other Ambulatory Visit: Payer: Self-pay

## 2017-06-15 DIAGNOSIS — Z170 Estrogen receptor positive status [ER+]: Principal | ICD-10-CM

## 2017-06-15 DIAGNOSIS — C50412 Malignant neoplasm of upper-outer quadrant of left female breast: Secondary | ICD-10-CM

## 2017-06-21 NOTE — Progress Notes (Signed)
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  INTERVAL HISTORY: Patient returns to clinic today for routine 64-monthevaluation.  She is tolerating letrozole well without significant side effects. She currently feels well and is asymptomatic. She has no neurologic complaints. She denies any recent fevers, chills, or illnesses. She has no chest pain or shortness of breath. She denies any nausea, vomiting, constipation, or diarrhea. She has no urinary complaints. Patient offers no specific complaints today.  REVIEW OF SYSTEMS:   Review of Systems  Constitutional: Negative.  Negative for fever, malaise/fatigue and weight loss.  HENT: Negative for sinus pain.   Respiratory: Negative.  Negative for cough and shortness of breath.   Cardiovascular: Negative.  Negative for chest pain and leg swelling.  Gastrointestinal: Negative.  Negative for abdominal pain, constipation and nausea.  Genitourinary: Negative for hematuria and urgency.  Musculoskeletal: Negative.   Skin: Negative.  Negative for rash.  Neurological: Negative.  Negative for sensory change, weakness and headaches.  Psychiatric/Behavioral: Negative.  The patient is not nervous/anxious and does not have insomnia.     As per HPI. Otherwise, a complete review of systems is negative.  PAST MEDICAL HISTORY: Past Medical History:  Diagnosis Date  . Arthritis   . [place] 03[place]   INVASIVE MAMMARY CARCINOMA  . Chickenpox   . Complication of anesthesia   . Frequent UTI   . GERD (gastroesophageal reflux disease)    RARE  . Hypertension   . PONV (postoperative nausea and vomiting)   . RSD (reflex sympathetic dystrophy)   . Seasonal allergies   . Skin cancer   . Urine incontinence     PAST SURGICAL HISTORY: Past Surgical History:  Procedure Laterality Date  . ABDOMINAL HYSTERECTOMY  2005   total/ Dr WDavis Gourd . BLADDER TUMOR EXCISION  2006  . BREAST BIOPSY Left 03/13/2016   INVASIVE MAMMARY CARCINOMA  . BREAST LUMPECTOMY WITH SENTINEL LYMPH NODE BIOPSY Left 07/14/2016   Procedure: BREAST LUMPECTOMY WITH SENTINEL LYMPH NODE BX;  Surgeon: JRobert Bellow MD;  Location: [facility] ORS;  Service: General;  Laterality: Left;  . denture surgery    . PORTACATH PLACEMENT N/A 04/01/2016   Procedure: INSERTION PORT-A-CATH;  Surgeon: JRobert Bellow MD;  Location: [facility] ORS;  Service: General;  Laterality: N/A;  . SKIN CANCER EXCISION Right 2015   foot/ Dr HN[place] Crane . TUBAL LIGATION    . TUMOR EXCISION Right    FOOT    FAMILY HISTORY: Family History  Problem Relation Age of Onset  . Emphysema Father   . Heart disease Mother   . Transient ischemic attack Mother   . Breast cancer Cousin        paternal  . Kidney cancer Neg Hx   . Bladder Cancer Neg Hx     ADVANCED DIRECTIVES (Y/N):  N  HEALTH MAINTENANCE: Social History   Tobacco Use  . Smoking status: Former Smoker    Packs/day: 1.00    Years: 20.00    Pack years: 20.00    Types: Cigarettes    Last attempt to quit: 04/14/1994    Years since quitting: 23.2  .  Smokeless tobacco: Never Used  Substance Use Topics  . Alcohol use: No    Alcohol/week: 0.0 oz  . Drug use: No     Colonoscopy:  PAP:  Bone density:  Lipid panel:  Allergies  Allergen Reactions  . Bee Pollen Hives  . Mosquito (Diagnostic) Hives  . Other Other (See Comments)    Uncoded Allergy. Allergen: VICRYL  (90% glycolide and 10% L-lactide [polyglactin-- synthetic absorbable sterile surgical suture]), Other Reaction: Infection  . Gabapentin Swelling  . Norvasc  [Amlodipine Besylate] Swelling    TO FEET  . Lisinopril Cough    Current Outpatient Medications  Medication Sig Dispense Refill  . Ascorbic Acid (VITAMIN C) 1000 MG tablet Take 1,000 mg by mouth daily.    . Calcium Carb-Cholecalciferol (CALCIUM/VITAMIN D PO) Take 1,200 mg by mouth.    . CRANBERRY PO Take 1 capsule by mouth 2 (two) times daily.    . hydrochlorothiazide (HYDRODIURIL) 25 MG tablet TAKE 1 TABLET BY MOUTH ONCE A DAY 90 tablet 1  . letrozole (FEMARA) 2.5 MG tablet Take 1 tablet (2.5 mg total) by mouth daily. 90 tablet 3  . losartan (COZAAR) 50 MG tablet TAKE 1 TABLET BY MOUTH ONCE A DAY 90 tablet 1  . Probiotic Product (PROBIOTIC PO) Take 1 tablet by mouth daily.    Marland Kitchen acetaminophen (TYLENOL) 500 MG tablet Take 1,000 mg by mouth every 6 (six) hours as needed for moderate pain.     Marland Kitchen ibuprofen (ADVIL,MOTRIN) 200 MG tablet Take 200 mg by mouth every 6 (six) hours as needed.     No current facility-administered medications for this visit.     OBJECTIVE: Vitals:   06/22/17 1117  BP: 137/77  Pulse: 96  Resp: 18  Temp: (!) 97.4 F (36.3 C)     Body mass index is 27.47 kg/m.    ECOG FS:0 - Asymptomatic  General: Well-developed, well-nourished, no acute distress. Eyes: Pink conjunctiva, anicteric sclera. Breasts: Well healing surgical scar in left breast. Exam deferred today. Lungs: Clear to auscultation bilaterally. Heart: Regular rate and rhythm. No rubs, murmurs, or gallops. Abdomen: Soft, nontender, nondistended. No organomegaly noted, normoactive bowel sounds. Musculoskeletal: No edema, cyanosis, or clubbing. Neuro: Alert, answering all questions appropriately. Cranial nerves grossly intact. Skin: No rash or petechiae noted. Psych: Normal affect.   LAB RESULTS:  Lab Results  Component Value Date   NA 137 12/22/2016   K 4.4 12/22/2016   CL 101 12/22/2016   CO2 28 12/22/2016   GLUCOSE 135 (H) 12/22/2016   BUN 21 (H) 12/22/2016   CREATININE 1.06 (H) 12/22/2016    CALCIUM 9.8 12/22/2016   PROT 7.5 12/22/2016   ALBUMIN 4.1 12/22/2016   AST 29 12/22/2016   ALT 37 12/22/2016   ALKPHOS 57 12/22/2016   BILITOT 0.7 12/22/2016   GFRNONAA 50 (L) 12/22/2016   GFRAA 58 (L) 12/22/2016    Lab Results  Component Value Date   WBC 5.0 12/22/2016   NEUTROABS 3.4 12/22/2016   HGB 12.4 12/22/2016   HCT 35.2 12/22/2016   MCV 92.8 12/22/2016   PLT 231 12/22/2016     STUDIES: No results found.  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  PLAN:    1. 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 She then underwent a lumpectomy  on July 14, 2016 and completed adjuvant XRT. Continue letrozole for total 5 years completing in June 2023.  Patient will require mammogram in the next 1-2 weeks.  Return to clinic in 6 months for further evaluation.  2. Constipation: Continue OTC treatments as needed. 3. Decreased EF%: Patient had a pretreatment EF of 41%. Continue monitoring evaluation per cardiology. 4. Osteopenia: Bone mineral density on November 13, 2016 revealed a T score of -2.3. Continue calcium and vitamin D. Repeat in August 2019. If patient has any further decrease in her density, will consider Fosamax.   Patient expressed understanding and was in agreement with this plan. She also understands that She can call clinic at any time with any questions, concerns, or complaints.   Cancer Staging Malignant neoplasm of upper-outer quadrant of left female breast [facility]) Staging form: Breast, AJCC 7th Edition - Clinical stage from 03/19/2016: Stage IIA (T2, N0, M0) - Signed by Lloyd Huger, MD on 04/06/2016 - Pathologic stage from 07/27/2016: Stage IIA (T2, N0, cM0) - Signed by Lloyd Huger, MD on 07/27/2016    Lloyd Huger, MD 06/26/17 10:34 AM

## 2017-06-22 ENCOUNTER — Inpatient Hospital Stay: Payer: PPO | Attending: Oncology | Admitting: Oncology

## 2017-06-22 ENCOUNTER — Other Ambulatory Visit: Payer: Self-pay

## 2017-06-22 VITALS — BP 137/77 | HR 96 | Temp 97.4°F | Resp 18 | Wt 165.1 lb

## 2017-06-22 DIAGNOSIS — Z87891 Personal history of nicotine dependence: Secondary | ICD-10-CM | POA: Insufficient documentation

## 2017-06-22 DIAGNOSIS — I10 Essential (primary) hypertension: Secondary | ICD-10-CM | POA: Diagnosis not present

## 2017-06-22 DIAGNOSIS — Z79899 Other long term (current) drug therapy: Secondary | ICD-10-CM | POA: Diagnosis not present

## 2017-06-22 DIAGNOSIS — Z170 Estrogen receptor positive status [ER+]: Secondary | ICD-10-CM | POA: Diagnosis not present

## 2017-06-22 DIAGNOSIS — C50412 Malignant neoplasm of upper-outer quadrant of left female breast: Secondary | ICD-10-CM | POA: Diagnosis not present

## 2017-06-22 DIAGNOSIS — Z85828 Personal history of other malignant neoplasm of skin: Secondary | ICD-10-CM | POA: Diagnosis not present

## 2017-06-22 DIAGNOSIS — Z79811 Long term (current) use of aromatase inhibitors: Secondary | ICD-10-CM | POA: Insufficient documentation

## 2017-06-22 DIAGNOSIS — K5900 Constipation, unspecified: Secondary | ICD-10-CM | POA: Diagnosis not present

## 2017-06-22 DIAGNOSIS — M8580 Other specified disorders of bone density and structure, unspecified site: Secondary | ICD-10-CM | POA: Insufficient documentation

## 2017-06-22 DIAGNOSIS — K219 Gastro-esophageal reflux disease without esophagitis: Secondary | ICD-10-CM | POA: Insufficient documentation

## 2017-06-22 DIAGNOSIS — M1990 Unspecified osteoarthritis, unspecified site: Secondary | ICD-10-CM | POA: Insufficient documentation

## 2017-06-22 DIAGNOSIS — Z87440 Personal history of urinary (tract) infections: Secondary | ICD-10-CM | POA: Insufficient documentation

## 2017-06-22 NOTE — Progress Notes (Signed)
Here for follow up. sttaed " infection at port site " saw Dr Tollie Pizza and he " cleaned it out "  Better now per pt -and site light pink color no pain.

## 2017-07-01 ENCOUNTER — Other Ambulatory Visit: Payer: Self-pay | Admitting: Primary Care

## 2017-07-01 DIAGNOSIS — I10 Essential (primary) hypertension: Secondary | ICD-10-CM

## 2017-07-12 IMAGING — MG MM BREAST SURGICAL SPECIMEN
1 series · 1 of 1 positions shown · non-contrast
Comparison: none

[L SPECIMEN]
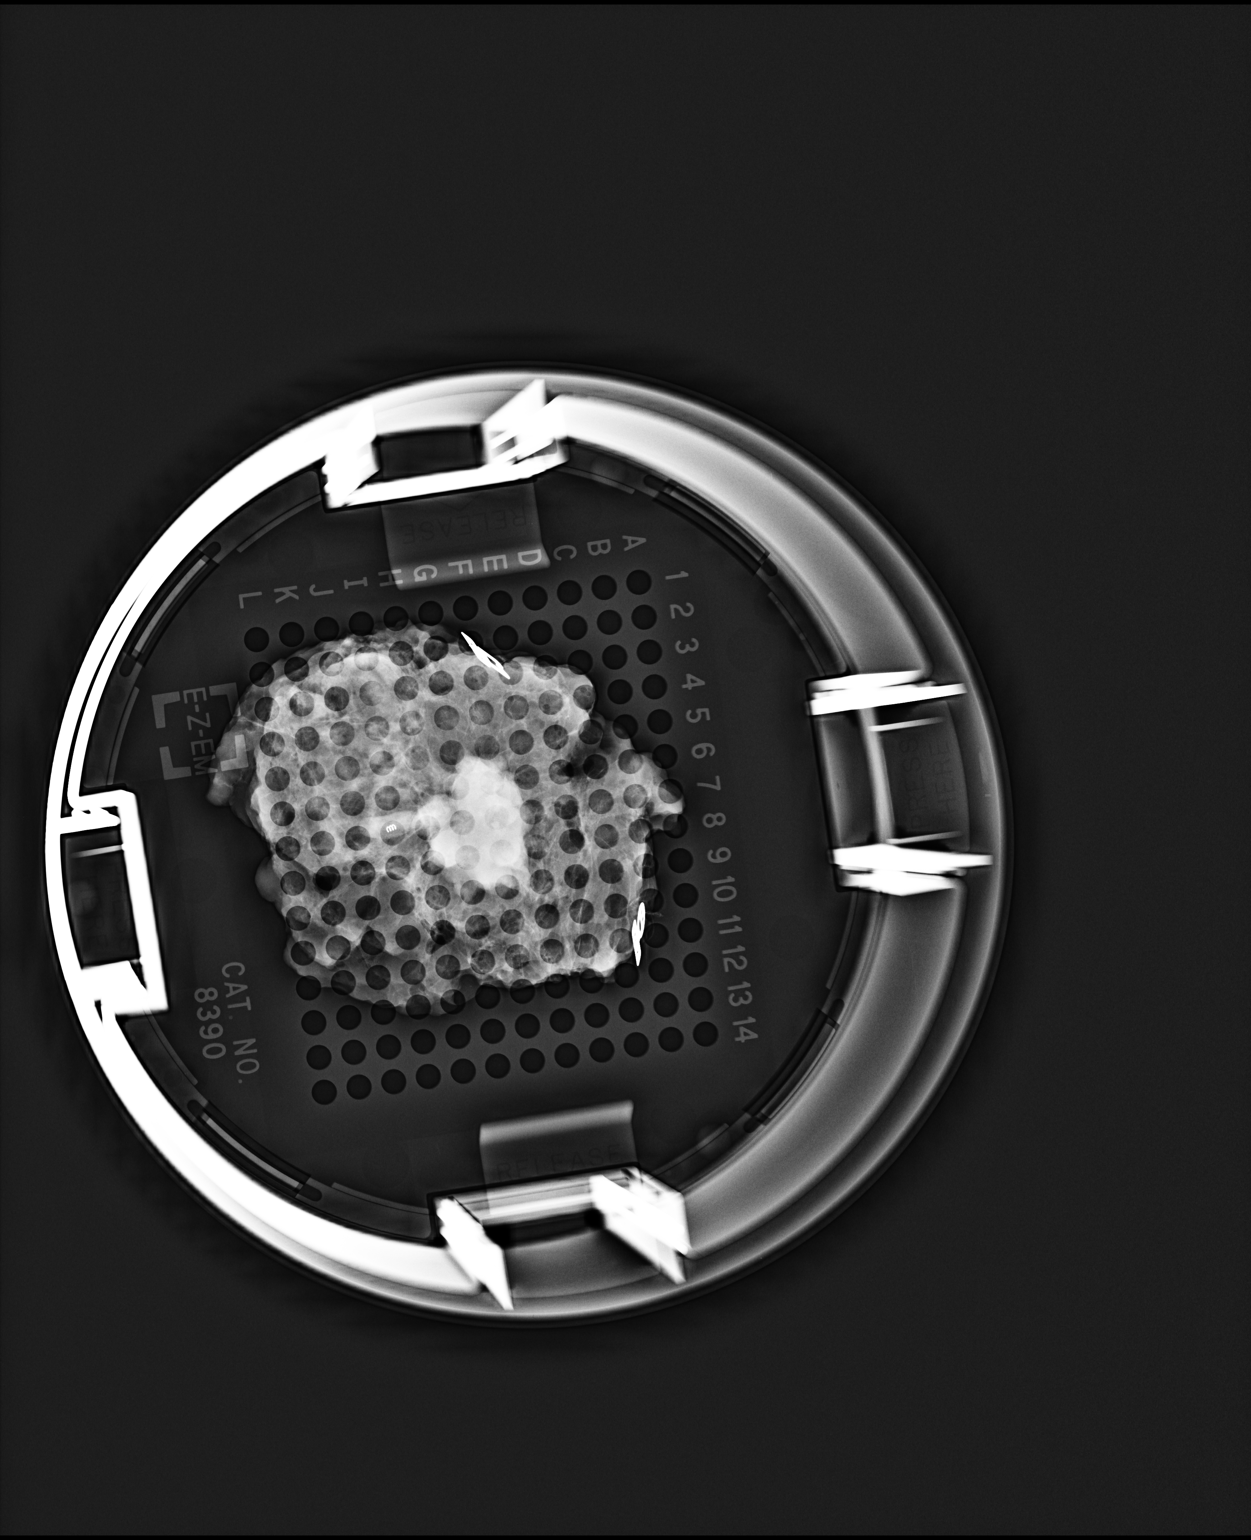

[1 of 1 positions shown; findings below may reference images not displayed]

Canned report from images found in remote index.

Refer to host system for actual result text.

## 2017-07-14 ENCOUNTER — Ambulatory Visit
Admission: RE | Admit: 2017-07-14 | Discharge: 2017-07-14 | Disposition: A | Discharge status: Home / Self Care | Payer: PPO | Source: Ambulatory Visit | Attending: General Surgery | Admitting: General Surgery

## 2017-07-14 DIAGNOSIS — Z853 Personal history of malignant neoplasm of breast: Secondary | ICD-10-CM | POA: Diagnosis not present

## 2017-07-14 DIAGNOSIS — C50412 Malignant neoplasm of upper-outer quadrant of left female breast: Secondary | ICD-10-CM | POA: Diagnosis present

## 2017-07-14 DIAGNOSIS — R928 Other abnormal and inconclusive findings on diagnostic imaging of breast: Secondary | ICD-10-CM | POA: Diagnosis not present

## 2017-07-14 DIAGNOSIS — Z170 Estrogen receptor positive status [ER+]: Secondary | ICD-10-CM | POA: Diagnosis present

## 2017-07-14 DIAGNOSIS — Z08 Encounter for follow-up examination after completed treatment for malignant neoplasm: Secondary | ICD-10-CM | POA: Diagnosis not present

## 2017-07-14 HISTORY — DX: Personal history of antineoplastic chemotherapy: Z92.21

## 2017-07-14 HISTORY — DX: Personal history of irradiation: Z92.3

## 2017-07-16 ENCOUNTER — Ambulatory Visit (INDEPENDENT_AMBULATORY_CARE_PROVIDER_SITE_OTHER): Payer: PPO | Admitting: General Surgery

## 2017-07-16 ENCOUNTER — Encounter: Payer: Self-pay | Admitting: General Surgery

## 2017-07-16 VITALS — BP 110/60 | HR 106 | Resp 16 | Ht 65.0 in | Wt 166.0 lb

## 2017-07-16 DIAGNOSIS — Z170 Estrogen receptor positive status [ER+]: Secondary | ICD-10-CM | POA: Diagnosis not present

## 2017-07-16 DIAGNOSIS — C50412 Malignant neoplasm of upper-outer quadrant of left female breast: Secondary | ICD-10-CM

## 2017-07-16 DIAGNOSIS — L02213 Cutaneous abscess of chest wall: Secondary | ICD-10-CM | POA: Diagnosis not present

## 2017-07-16 NOTE — Patient Instructions (Addendum)
The patient is aware to call back for any questions or concerns. Keep area clean 

## 2017-07-16 NOTE — Progress Notes (Addendum)
Patient ID: Catherine Tate, female   DOB: 08/18/1940, 77 y.o.   MRN: [id number]  Chief Complaint  Patient presents with  . Follow-up    HPI Catherine Tate is a 77 y.o. female.  who presents for her follow up left breast cancer and a breast evaluation. The most recent mammogram was done on 07-14-17. She states that biopsy site is still draining some, no pain. Patient does perform regular self breast checks and gets regular mammograms done.   Years ago, the patient was told by Dr. Davis Gourd that she was allergic to Vicryl suture.  This material had been used both for her PowerPort and for her breast surgery without obvious ill effect. July 14, 2017 bilateral diagnostic mammograms were reviewed.  BI-RADS-2. During excisional debridement of the right chest wall lesion below the old PowerPort site Vicryl sutures were used for hemostasis.  10 cc of 0.5% Xylocaine with 0.25% Marcaine with 1-200,000 units of epinephrine was utilized. HPI  Past Medical History:  Diagnosis Date  . Arthritis   . Cancer ([place]) 03/13/2016   INVASIVE MAMMARY CARCINOMA  . Chickenpox   . Complication of anesthesia   . Frequent UTI   . GERD (gastroesophageal reflux disease)    RARE  . Hypertension   . Personal history of chemotherapy 2017/2018   F/U left breast cancer  . Personal history of radiation therapy 2018   F/U left breast cancer  . PONV (postoperative nausea and vomiting)   . RSD (reflex sympathetic dystrophy)   . Seasonal allergies   . Skin cancer   . Urine incontinence     Past Surgical History:  Procedure Laterality Date  . ABDOMINAL HYSTERECTOMY  2005   total/ Dr Davis Gourd  . BLADDER TUMOR EXCISION  2006  . BREAST BIOPSY Left 03/13/2016   INVASIVE MAMMARY CARCINOMA  . BREAST LUMPECTOMY Left 2018  . BREAST LUMPECTOMY WITH SENTINEL LYMPH NODE BIOPSY Left 07/14/2016   Procedure: BREAST LUMPECTOMY WITH SENTINEL LYMPH NODE BX;  Surgeon: [name], MD;  Location: [facility] ORS;  Service: General;   Laterality: Left;  . denture surgery    . OOPHORECTOMY    . PORTACATH PLACEMENT N/A 04/01/2016   Procedure: INSERTION PORT-A-CATH;  Surgeon: [name], MD;  Location: [facility] ORS;  Service: General;  Laterality: N/A;  . SKIN CANCER EXCISION Right 2015   foot/ Dr [place] Crane  . TUBAL LIGATION    . TUMOR EXCISION Right    FOOT    Family History  Problem Relation Age of Onset  . Emphysema Father   . Heart disease Mother   . Transient ischemic attack Mother   . Breast cancer Cousin        paternal  . Kidney cancer Neg Hx   . Bladder Cancer Neg Hx     Social History Social History   Tobacco Use  . Smoking status: Former Smoker    Packs/day: 1.00    Years: 20.00    Pack years: 20.00    Types: Cigarettes    Last attempt to quit: 04/14/1994    Years since quitting: 23.2  . Smokeless tobacco: Never Used  Substance Use Topics  . Alcohol use: No    Alcohol/week: 0.0 oz  . Drug use: No    Allergies  Allergen Reactions  . Bee Pollen Hives  . Mosquito (Diagnostic) Hives  . Other Other (See Comments)    Uncoded Allergy. Allergen: VICRYL  (90% glycolide and 10% L-lactide [polyglactin-- synthetic absorbable sterile surgical suture]), Other  Reaction: Infection  . Gabapentin Swelling  . Norvasc [Amlodipine Besylate] Swelling    TO FEET  . Lisinopril Cough    Current Outpatient Medications  Medication Sig Dispense Refill  . acetaminophen (TYLENOL) 500 MG tablet Take 1,000 mg by mouth every 6 (six) hours as needed for moderate pain.     . Ascorbic Acid (VITAMIN C) 1000 MG tablet Take 1,000 mg by mouth daily.    . Calcium Carb-Cholecalciferol (CALCIUM/VITAMIN D PO) Take 1,200 mg by mouth.    . CRANBERRY PO Take 1 capsule by mouth 2 (two) times daily.    . hydrochlorothiazide (HYDRODIURIL) 25 MG tablet TAKE 1 TABLET BY MOUTH ONCE A DAY 90 tablet 1  . ibuprofen (ADVIL,MOTRIN) 200 MG tablet Take 200 mg by mouth every 6 (six) hours as needed.    Marland Kitchen letrozole (FEMARA) 2.5 MG tablet Take  1 tablet (2.5 mg total) by mouth daily. 90 tablet 3  . losartan (COZAAR) 50 MG tablet TAKE 1 TABLET BY MOUTH ONCE DAILY 90 tablet 1  . Probiotic Product (PROBIOTIC PO) Take 1 tablet by mouth daily.     No current facility-administered medications for this visit.     Review of Systems Review of Systems  Constitutional: Negative.   Respiratory: Negative.   Cardiovascular: Negative.     Blood pressure 110/60, pulse (!) 106, resp. rate 16, height 5\' 5"  (1.651 m), weight 166 lb (75.3 kg).  Physical Exam Physical Exam  Constitutional: She is oriented to person, place, and time. She appears well-developed and well-nourished.  HENT:  Mouth/Throat: Oropharynx is clear and moist.  Eyes: Conjunctivae are normal. No scleral icterus.  Neck: Neck supple.  Cardiovascular: Normal rate, regular rhythm and normal heart sounds.  Pulmonary/Chest: Effort normal and breath sounds normal. Right breast exhibits no inverted nipple, no mass, no nipple discharge, no skin change and no tenderness. Left breast exhibits no inverted nipple, no mass, no nipple discharge, no skin change and no tenderness.    Left lumpectomy incision healing well, mild thickening  Lymphadenopathy:    She has no cervical adenopathy.    She has no axillary adenopathy.  Neurological: She is alert and oriented to person, place, and time.  Skin: Skin is warm and dry.  Psychiatric: Her behavior is normal.    Data Reviewed The skin was cleansed with ChloraPrep.  The 2 pustular sites along the original incision from May 15, 2017 were connected and a culture obtained.  Thickened tissue was noted below the incision site and this was sharply excised and discarded.  Clean base was noted.  The area was cleansed with peroxide followed by dry dressing of Telfa and paper tape, due to reported allergy to Tegaderm.  Bilateral diagnostic mammograms dated July 14, 2017 were reviewed.  Postsurgical change.  BI-RADS-2. Assessment     Persistent skin inflammation.  Possibly related to Vicryl suture.    Plan    The patient was instructed in regards to wound care.  Nurse visit in one week. The patient has been asked to return to the office in one year with a bilateral diagnostic mammogram.      HPI, Physical Exam, Assessment and Plan have been scribed under the direction and in the presence of [name], MD. Karie Fetch, RN    [name] Jose Alleyne 07/16/2017, 8:04 PM

## 2017-07-22 ENCOUNTER — Ambulatory Visit: Payer: PPO

## 2017-07-22 DIAGNOSIS — L02213 Cutaneous abscess of chest wall: Secondary | ICD-10-CM

## 2017-07-22 NOTE — Progress Notes (Signed)
Patient ID: Catherine Tate, female   DOB: 06-11-1940, 77 y.o.   MRN: [id number]  Patient came in today for a wound check.  The wound is clean, small amount of clear drainage noted, no redness or signs of infection noted. Patient instructed to keep a light dressing over the area and may use Neosporin once daily after her shower.

## 2017-07-23 LAB — ANAEROBIC AND AEROBIC CULTURE

## 2017-08-07 ENCOUNTER — Telehealth: Payer: Self-pay

## 2017-08-07 MED ORDER — SULFAMETHOXAZOLE-TRIMETHOPRIM 800-160 MG PO TABS: 1.0000 | ORAL_TABLET | Freq: Two times a day (BID) | ORAL | 0 refills | Status: AC

## 2017-08-07 NOTE — Telephone Encounter (Signed)
Spoke with patient about her chest wall. She states that her chest wall area is still open and still having a little drainage. I let her know that I have sent in a prescription for Bactrim DS to be taken twice daily for the next 10 days. She will follow up with Dr Bary Castilla on 08/25/17 at 10:45 am.

## 2017-08-07 NOTE — Telephone Encounter (Signed)
-----   Message from [name], MD sent at 08/07/2017  7:40 AM EDT ----- Please contact the patient to see if the chest wall area has completely healed, if so, f/u as scheduled. If not please send in a RX for Bactrim DS, #20 to take 2/ day.   F/U visit in two weeks.  Thaks.

## 2017-08-10 ENCOUNTER — Other Ambulatory Visit (INDEPENDENT_AMBULATORY_CARE_PROVIDER_SITE_OTHER): Payer: PPO

## 2017-08-10 DIAGNOSIS — E785 Hyperlipidemia, unspecified: Secondary | ICD-10-CM | POA: Diagnosis not present

## 2017-08-10 LAB — LIPID PANEL
Cholesterol: 214 mg/dL — ABNORMAL HIGH (ref 0–200)
HDL: 43.7 mg/dL (ref >39.00)
LDL CALC: 140 mg/dL — AB (ref 0–99)
NONHDL: 170.17
Total CHOL/HDL Ratio: 5
Triglycerides: 151 mg/dL — ABNORMAL HIGH (ref 0.0–149.0)
VLDL: 30.2 mg/dL (ref 0.0–40.0)

## 2017-08-17 ENCOUNTER — Telehealth: Payer: Self-pay | Admitting: *Deleted

## 2017-08-17 NOTE — Telephone Encounter (Signed)
Patient called and wants a refill on Bactrim. The patient states the area is still draining and she does not have an appointment with Dr. Bary Castilla until 08-25-17.  Message to Dr. Bary Castilla to see if he is willing to refill medication.   The patient uses [facility] and requests a call back on her home number.

## 2017-08-19 ENCOUNTER — Other Ambulatory Visit: Payer: Self-pay | Admitting: General Surgery

## 2017-08-19 MED ORDER — CIPROFLOXACIN HCL 500 MG PO TABS: 500.0000 mg | ORAL_TABLET | Freq: Two times a day (BID) | ORAL | 0 refills | Status: AC

## 2017-08-19 NOTE — Telephone Encounter (Signed)
Patient notified as instructed. She verbalizes understanding.

## 2017-08-19 NOTE — Telephone Encounter (Signed)
RX for Cipro sent to pharmacy.

## 2017-08-19 NOTE — Progress Notes (Signed)
Patient has failed to resolve soft tissue infection on Bactrim DS although both organisms reportedly senstivie. Will RX w/ Cipro pending f/u exam.

## 2017-08-25 ENCOUNTER — Encounter: Payer: Self-pay | Admitting: General Surgery

## 2017-08-25 ENCOUNTER — Ambulatory Visit (INDEPENDENT_AMBULATORY_CARE_PROVIDER_SITE_OTHER): Payer: PPO | Admitting: General Surgery

## 2017-08-25 VITALS — BP 140/89 | HR 88 | Temp 97.9°F | Resp 14 | Ht 65.0 in | Wt 165.0 lb

## 2017-08-25 DIAGNOSIS — L02213 Cutaneous abscess of chest wall: Secondary | ICD-10-CM

## 2017-08-25 NOTE — Progress Notes (Signed)
Patient ID: Catherine Tate, female   DOB: March 08, 1941, 77 y.o.   MRN: [id number]  Chief Complaint  Patient presents with  . Follow-up    HPI Catherine Tate is a 77 y.o. female here today for her follow up chest wall abscess dnne on 07/16/2017. She has three more days left of antibiotics. She reports that the redness has improved. She still is having some drainage with a little blood. No reports of fever or chills.  HPI  Past Medical History:  Diagnosis Date  . Arthritis   . Cancer ([place]) 03/13/2016   INVASIVE MAMMARY CARCINOMA  . Chickenpox   . Complication of anesthesia   . Frequent UTI   . GERD (gastroesophageal reflux disease)    RARE  . Hypertension   . Personal history of chemotherapy 2017/2018   F/U left breast cancer  . Personal history of radiation therapy 2018   F/U left breast cancer  . PONV (postoperative nausea and vomiting)   . RSD (reflex sympathetic dystrophy)   . Seasonal allergies   . Skin cancer   . Urine incontinence     Past Surgical History:  Procedure Laterality Date  . ABDOMINAL HYSTERECTOMY  2005   total/ Dr Davis Gourd  . BLADDER TUMOR EXCISION  2006  . BREAST BIOPSY Left 03/13/2016   INVASIVE MAMMARY CARCINOMA  . BREAST LUMPECTOMY Left 2018  . BREAST LUMPECTOMY WITH SENTINEL LYMPH NODE BIOPSY Left 07/14/2016   Procedure: BREAST LUMPECTOMY WITH SENTINEL LYMPH NODE BX;  Surgeon: [name], MD;  Location: [facility] ORS;  Service: General;  Laterality: Left;  . denture surgery    . OOPHORECTOMY    . PORTACATH PLACEMENT N/A 04/01/2016   Procedure: INSERTION PORT-A-CATH;  Surgeon: [name], MD;  Location: [facility] ORS;  Service: General;  Laterality: N/A;  . SKIN CANCER EXCISION Right 2015   foot/ Dr [place] Crane  . TUBAL LIGATION    . TUMOR EXCISION Right    FOOT    Family History  Problem Relation Age of Onset  . Emphysema Father   . Heart disease Mother   . Transient ischemic attack Mother   . Breast cancer Cousin        paternal  . Kidney  cancer Neg Hx   . Bladder Cancer Neg Hx     Social History Social History   Tobacco Use  . Smoking status: Former Smoker    Packs/day: 1.00    Years: 20.00    Pack years: 20.00    Types: Cigarettes    Last attempt to quit: 04/14/1994    Years since quitting: 23.3  . Smokeless tobacco: Never Used  Substance Use Topics  . Alcohol use: No    Alcohol/week: 0.0 oz  . Drug use: No    Allergies  Allergen Reactions  . Bee Pollen Hives  . Mosquito (Diagnostic) Hives  . Other Other (See Comments)    Uncoded Allergy. Allergen: VICRYL  (90% glycolide and 10% L-lactide [polyglactin-- synthetic absorbable sterile surgical suture]), Other Reaction: Infection  . Gabapentin Swelling  . Norvasc [Amlodipine Besylate] Swelling    TO FEET  . Lisinopril Cough    Current Outpatient Medications  Medication Sig Dispense Refill  . acetaminophen (TYLENOL) 500 MG tablet Take 1,000 mg by mouth every 6 (six) hours as needed for moderate pain.     . Ascorbic Acid (VITAMIN C) 1000 MG tablet Take 1,000 mg by mouth daily.    . Calcium Carb-Cholecalciferol (CALCIUM/VITAMIN D PO) Take 1,200 mg by  mouth.    . ciprofloxacin (CIPRO) 500 MG tablet Take 1 tablet (500 mg total) by mouth 2 (two) times daily for 10 days. 20 tablet 0  . CRANBERRY PO Take 1 capsule by mouth 2 (two) times daily.    . hydrochlorothiazide (HYDRODIURIL) 25 MG tablet TAKE 1 TABLET BY MOUTH ONCE A DAY 90 tablet 1  . ibuprofen (ADVIL,MOTRIN) 200 MG tablet Take 200 mg by mouth every 6 (six) hours as needed.    Marland Kitchen letrozole (FEMARA) 2.5 MG tablet Take 1 tablet (2.5 mg total) by mouth daily. 90 tablet 3  . losartan (COZAAR) 50 MG tablet TAKE 1 TABLET BY MOUTH ONCE DAILY 90 tablet 1  . Probiotic Product (PROBIOTIC PO) Take 1 tablet by mouth daily.     No current facility-administered medications for this visit.     Review of Systems Review of Systems  Constitutional: Negative.   Respiratory: Negative.   Cardiovascular: Negative.      Blood pressure 140/89, pulse 88, temperature 97.9 F (36.6 C), resp. rate 14, height 5\' 5"  (1.651 m), weight 165 lb (74.8 kg).  Physical Exam Physical Exam  Constitutional: She is oriented to person, place, and time. She appears well-developed and well-nourished.  Pulmonary/Chest:    Neurological: She is alert and oriented to person, place, and time.  Skin: Skin is warm and dry.  Psychiatric: She has a normal mood and affect.    Data Reviewed Culture result from July 16, 2017 showed staph aureus and staph epidermidis only obtained from the broth.  Both were sensitive to ciprofloxacin which she will be completing a course of in the near future.  Assessment    Slow resolution of right chest wall abscess.    Plan Patient to return in 9 days.  Will use Neosporin to dressing pad and will change daily.   HPI, Physical Exam, Assessment and Plan have been scribed under the direction and in the presence of [name], MD  Concepcion Living, LPN  I have completed the exam and reviewed the above documentation for accuracy and completeness.  I agree with the above.  Haematologist has been used and any errors in dictation or transcription are unintentional.  Hervey Ard, M.D., F.A.C.S.  [name] Clarise Chacko 08/25/2017, 7:46 PM

## 2017-08-25 NOTE — Patient Instructions (Addendum)
Change dressing daily. Your drainage may be black, this is normal.  Use Neosporin to dressing pad once daily when you change your dressing.  Follow up next Thursday.

## 2017-09-03 ENCOUNTER — Ambulatory Visit (INDEPENDENT_AMBULATORY_CARE_PROVIDER_SITE_OTHER): Payer: PPO | Admitting: General Surgery

## 2017-09-03 ENCOUNTER — Encounter: Payer: Self-pay | Admitting: General Surgery

## 2017-09-03 VITALS — BP 136/72 | HR 72 | Temp 97.8°F | Resp 12 | Ht 67.0 in | Wt 164.0 lb

## 2017-09-03 DIAGNOSIS — L02213 Cutaneous abscess of chest wall: Secondary | ICD-10-CM

## 2017-09-03 MED ORDER — SULFAMETHOXAZOLE-TRIMETHOPRIM 800-160 MG PO TABS: 1.0000 | ORAL_TABLET | Freq: Two times a day (BID) | ORAL | 0 refills | Status: DC

## 2017-09-03 MED ORDER — SILVER SULFADIAZINE 1 % EX CREA: TOPICAL_CREAM | CUTANEOUS | 1 refills | Status: DC

## 2017-09-03 NOTE — Patient Instructions (Addendum)
  Rx sent to pharmacy. Return in ten days. Use cream daily. The patient is aware to call back for any questions or concerns.

## 2017-09-03 NOTE — Progress Notes (Signed)
Patient ID: Catherine Tate, female   DOB: [birth year], 77 y.o.   MRN: [id number]  Chief Complaint  Patient presents with  . Follow-up    HPI Catherine Tate is a 77 y.o. female here today for her follow up chest wall abscess. She states the area is still draining.  Completed course of Cipro as requested. HPI  Past Medical History:  Diagnosis Date  . Arthritis   . Cancer ([place]) 03/13/2016   INVASIVE MAMMARY CARCINOMA  . Chickenpox   . Complication of anesthesia   . Frequent UTI   . GERD (gastroesophageal reflux disease)    RARE  . Hypertension   . Personal history of chemotherapy 2017/2018   F/U left breast cancer  . Personal history of radiation therapy 2018   F/U left breast cancer  . PONV (postoperative nausea and vomiting)   . RSD (reflex sympathetic dystrophy)   . Seasonal allergies   . Skin cancer   . Urine incontinence     Past Surgical History:  Procedure Laterality Date  . ABDOMINAL HYSTERECTOMY  2005   total/ Dr Davis Gourd  . BLADDER TUMOR EXCISION  2006  . BREAST BIOPSY Left 03/13/2016   INVASIVE MAMMARY CARCINOMA  . BREAST LUMPECTOMY Left 2018  . BREAST LUMPECTOMY WITH SENTINEL LYMPH NODE BIOPSY Left 07/14/2016   Procedure: BREAST LUMPECTOMY WITH SENTINEL LYMPH NODE BX;  Surgeon: [name], MD;  Location: [facility] ORS;  Service: General;  Laterality: Left;  . denture surgery    . OOPHORECTOMY    . PORTACATH PLACEMENT N/A 04/01/2016   Procedure: INSERTION PORT-A-CATH;  Surgeon: [name], MD;  Location: [facility] ORS;  Service: General;  Laterality: N/A;  . SKIN CANCER EXCISION Right 2015   foot/ Dr Nevada Crane  . TUBAL LIGATION    . TUMOR EXCISION Right    FOOT    Family History  Problem Relation Age of Onset  . Emphysema Father   . Heart disease Mother   . Transient ischemic attack Mother   . Breast cancer Cousin        paternal  . Kidney cancer Neg Hx   . Bladder Cancer Neg Hx     Social History Social History   Tobacco Use  . Smoking status:  Former Smoker    Packs/day: 1.00    Years: 20.00    Pack years: 20.00    Types: Cigarettes    Last attempt to quit: 04/14/1994    Years since quitting: 23.4  . Smokeless tobacco: Never Used  Substance Use Topics  . Alcohol use: No    Alcohol/week: 0.0 oz  . Drug use: No    Allergies  Allergen Reactions  . Bee Pollen Hives  . Mosquito (Diagnostic) Hives  . Other Other (See Comments)    Uncoded Allergy. Allergen: VICRYL  (90% glycolide and 10% L-lactide [polyglactin-- synthetic absorbable sterile surgical suture]), Other Reaction: Infection  . Gabapentin Swelling  . Norvasc [Amlodipine Besylate] Swelling    TO FEET  . Lisinopril Cough    Current Outpatient Medications  Medication Sig Dispense Refill  . acetaminophen (TYLENOL) 500 MG tablet Take 1,000 mg by mouth every 6 (six) hours as needed for moderate pain.     . Ascorbic Acid (VITAMIN C) 1000 MG tablet Take 1,000 mg by mouth daily.    . Calcium Carb-Cholecalciferol (CALCIUM/VITAMIN D PO) Take 1,200 mg by mouth.    . CRANBERRY PO Take 1 capsule by mouth 2 (two) times daily.    . hydrochlorothiazide (  HYDRODIURIL) 25 MG tablet TAKE 1 TABLET BY MOUTH ONCE A DAY 90 tablet 1  . ibuprofen (ADVIL,MOTRIN) 200 MG tablet Take 200 mg by mouth every 6 (six) hours as needed.    Marland Kitchen letrozole (FEMARA) 2.5 MG tablet Take 1 tablet (2.5 mg total) by mouth daily. 90 tablet 3  . losartan (COZAAR) 50 MG tablet TAKE 1 TABLET BY MOUTH ONCE DAILY 90 tablet 1  . Probiotic Product (PROBIOTIC PO) Take 1 tablet by mouth daily.    . silver sulfADIAZINE (SILVADENE) 1 % cream Apply to affected area daily 50 g 1  . sulfamethoxazole-trimethoprim (BACTRIM DS,SEPTRA DS) 800-160 MG tablet Take 1 tablet by mouth 2 (two) times daily. 20 tablet 0   No current facility-administered medications for this visit.     Review of Systems Review of Systems  Constitutional: Negative.   Respiratory: Negative.   Cardiovascular: Negative.     Blood pressure 136/72,  pulse 72, temperature 97.8 F (36.6 C), temperature source Oral, resp. rate 12, height 5\' 7"  (1.702 m), weight 164 lb (74.4 kg).  Physical Exam Physical Exam  Constitutional: She is oriented to person, place, and time. She appears well-developed and well-nourished.  Pulmonary/Chest:    Neurological: She is alert and oriented to person, place, and time.  Skin: Skin is warm and dry.    Data Reviewed Cultures show staph and strep, both sensitive to Cipro and sulfa.  Assessment    Peculiar wound with healing well delayed beyond expectation.      Plan  We will have the patient take a course of Bactrim DS 1 p.o. twice daily.  She will apply Silvadene cream (if not cost prohibitive) twice daily.  Rx sent to pharmacy. Return in ten days. The patient is aware to call back for any questions or concerns.    HPI, Physical Exam, Assessment and Plan have been scribed under the direction and in the presence of Hervey Ard, MD.  [name], CMA  I have completed the exam and reviewed the above documentation for accuracy and completeness.  I agree with the above.  Haematologist has been used and any errors in dictation or transcription are unintentional.  Hervey Ard, M.D., F.A.C.S.  [name] Heman Que 09/03/2017, 9:24 PM

## 2017-09-17 ENCOUNTER — Encounter: Payer: Self-pay | Admitting: General Surgery

## 2017-09-17 ENCOUNTER — Ambulatory Visit (INDEPENDENT_AMBULATORY_CARE_PROVIDER_SITE_OTHER): Payer: PPO | Admitting: General Surgery

## 2017-09-17 VITALS — BP 132/70 | HR 76 | Resp 16 | Ht 65.0 in | Wt 163.0 lb

## 2017-09-17 DIAGNOSIS — L02213 Cutaneous abscess of chest wall: Secondary | ICD-10-CM

## 2017-09-17 NOTE — Patient Instructions (Addendum)
Nurse for two weeks .Return in three weeks. . The patient is aware to call back for any questions or concerns.

## 2017-09-17 NOTE — Progress Notes (Signed)
Patient ID: Catherine Tate, female   DOB: [birth year], 77 y.o.   MRN: [id number]  Chief Complaint  Patient presents with  . Follow-up    HPI SLYVIA Tate is a 77 y.o. female here for a follow up from a chest wall abscess. Finished her Bactrim on Saturday.  HPI  Past Medical History:  Diagnosis Date  . Arthritis   . Cancer ([place]) 03/13/2016   INVASIVE MAMMARY CARCINOMA  . Chickenpox   . Complication of anesthesia   . Frequent UTI   . GERD (gastroesophageal reflux disease)    RARE  . Hypertension   . Personal history of chemotherapy 2017/2018   F/U left breast cancer  . Personal history of radiation therapy 2018   F/U left breast cancer  . PONV (postoperative nausea and vomiting)   . RSD (reflex sympathetic dystrophy)   . Seasonal allergies   . Skin cancer   . Urine incontinence     Past Surgical History:  Procedure Laterality Date  . ABDOMINAL HYSTERECTOMY  2005   total/ Dr Davis Gourd  . BLADDER TUMOR EXCISION  2006  . BREAST BIOPSY Left 03/13/2016   INVASIVE MAMMARY CARCINOMA  . BREAST LUMPECTOMY Left 2018  . BREAST LUMPECTOMY WITH SENTINEL LYMPH NODE BIOPSY Left 07/14/2016   Procedure: BREAST LUMPECTOMY WITH SENTINEL LYMPH NODE BX;  Surgeon: [name], MD;  Location: [facility] ORS;  Service: General;  Laterality: Left;  . denture surgery    . OOPHORECTOMY    . PORTACATH PLACEMENT N/A 04/01/2016   Procedure: INSERTION PORT-A-CATH;  Surgeon: [name], MD;  Location: [facility] ORS;  Service: General;  Laterality: N/A;  . SKIN CANCER EXCISION Right 2015   foot/ Dr [place] Crane  . TUBAL LIGATION    . TUMOR EXCISION Right    FOOT    Family History  Problem Relation Age of Onset  . Emphysema Father   . Heart disease Mother   . Transient ischemic attack Mother   . Breast cancer Cousin        paternal  . Kidney cancer Neg Hx   . Bladder Cancer Neg Hx     Social History Social History   Tobacco Use  . Smoking status: Former Smoker    Packs/day: 1.00    Years:  20.00    Pack years: 20.00    Types: Cigarettes    Last attempt to quit: 04/14/1994    Years since quitting: 23.4  . Smokeless tobacco: Never Used  Substance Use Topics  . Alcohol use: No    Alcohol/week: 0.0 oz  . Drug use: No    Allergies  Allergen Reactions  . Bee Pollen Hives  . Mosquito (Diagnostic) Hives  . Other Other (See Comments)    Uncoded Allergy. Allergen: VICRYL  (90% glycolide and 10% L-lactide [polyglactin-- synthetic absorbable sterile surgical suture]), Other Reaction: Infection  . Gabapentin Swelling  . Norvasc [Amlodipine Besylate] Swelling    TO FEET  . Lisinopril Cough    Current Outpatient Medications  Medication Sig Dispense Refill  . acetaminophen (TYLENOL) 500 MG tablet Take 1,000 mg by mouth every 6 (six) hours as needed for moderate pain.     . Ascorbic Acid (VITAMIN C) 1000 MG tablet Take 1,000 mg by mouth daily.    . Calcium Carb-Cholecalciferol (CALCIUM/VITAMIN D PO) Take 1,200 mg by mouth.    . CRANBERRY PO Take 1 capsule by mouth 2 (two) times daily.    . hydrochlorothiazide (HYDRODIURIL) 25 MG tablet TAKE 1 TABLET  BY MOUTH ONCE A DAY 90 tablet 1  . ibuprofen (ADVIL,MOTRIN) 200 MG tablet Take 200 mg by mouth every 6 (six) hours as needed.    Marland Kitchen letrozole (FEMARA) 2.5 MG tablet Take 1 tablet (2.5 mg total) by mouth daily. 90 tablet 3  . losartan (COZAAR) 50 MG tablet TAKE 1 TABLET BY MOUTH ONCE DAILY 90 tablet 1  . Probiotic Product (PROBIOTIC PO) Take 1 tablet by mouth daily.    . silver sulfADIAZINE (SILVADENE) 1 % cream Apply to affected area daily 50 g 1  . sulfamethoxazole-trimethoprim (BACTRIM DS,SEPTRA DS) 800-160 MG tablet Take 1 tablet by mouth 2 (two) times daily. 20 tablet 0   No current facility-administered medications for this visit.     Review of Systems Review of Systems  Constitutional: Negative.   Respiratory: Negative.   Cardiovascular: Negative.     Blood pressure 132/70, pulse 76, resp. rate 16, height 5\' 5"  (1.651 m),  weight 163 lb (73.9 kg).  Physical Exam Physical Exam  Pulmonary/Chest:      Data Reviewed July 16, 2017 culture showed Staphylococcus aureus and Staphylococcus epidermidis.  Assessment    Incredibly slow resolution of inflammatory process in the right chest wall.  No evidence of retained foreign body.    Plan   Nurse for two weeks with silver nitrate application at each visit.Marland KitchenReturn for MD exam in three weeks. .   The patient is aware to call back for any questions or concerns.  HPI, Physical Exam, Assessment and Plan have been scribed under the direction and in the presence of [name], MD  Concepcion Living, LPN  I have completed the exam and reviewed the above documentation for accuracy and completeness.  I agree with the above.  Haematologist has been used and any errors in dictation or transcription are unintentional.  Hervey Ard, M.D., F.A.C.S.   [name] Lazer Wollard 09/19/2017, 9:50 AM

## 2017-09-24 ENCOUNTER — Ambulatory Visit: Payer: PPO | Admitting: *Deleted

## 2017-09-24 DIAGNOSIS — L02213 Cutaneous abscess of chest wall: Secondary | ICD-10-CM

## 2017-09-24 NOTE — Progress Notes (Signed)
Patient came in today for a wound check.  The wound is clean, with no signs of infection noted, few dark areas noted. Mild drainage. The wound is 1.2 x 2.5 cm, viewed by Dr Bary Castilla. Silvadene cream twice a day, no silver nitrate application done. Follow up as scheduled one week nurse.

## 2017-09-24 NOTE — Patient Instructions (Signed)
The patient is aware to call back for any questions or concerns.  

## 2017-10-01 ENCOUNTER — Ambulatory Visit (INDEPENDENT_AMBULATORY_CARE_PROVIDER_SITE_OTHER): Payer: PPO

## 2017-10-01 DIAGNOSIS — L02213 Cutaneous abscess of chest wall: Secondary | ICD-10-CM

## 2017-10-01 NOTE — Progress Notes (Signed)
Patient ID: Catherine Tate, female   DOB: 1941/03/17, 77 y.o.   MRN: [id number] Patient came in today for a wound check. The wound is clean, with no signs of infection noted, no redness. Area is 2.5cm x 1.1cm. Follow up as scheduled.

## 2017-10-06 ENCOUNTER — Encounter: Payer: Self-pay | Admitting: General Surgery

## 2017-10-06 ENCOUNTER — Ambulatory Visit (INDEPENDENT_AMBULATORY_CARE_PROVIDER_SITE_OTHER): Payer: PPO | Admitting: General Surgery

## 2017-10-06 VITALS — BP 132/74 | HR 76 | Resp 13 | Ht 66.0 in | Wt 164.0 lb

## 2017-10-06 DIAGNOSIS — L02213 Cutaneous abscess of chest wall: Secondary | ICD-10-CM

## 2017-10-06 NOTE — Progress Notes (Signed)
Patient ID: Catherine Tate, female   DOB: [birth year], 77 y.o.   MRN: [id number]  Chief Complaint  Patient presents with  . Follow-up    HPI Catherine Tate is a 77 y.o. female here today for a chest wall mass. No change since last visit.  HPI  Past Medical History:  Diagnosis Date  . Arthritis   . Cancer ([place]) 03/13/2016   INVASIVE MAMMARY CARCINOMA  . Chickenpox   . Complication of anesthesia   . Frequent UTI   . GERD (gastroesophageal reflux disease)    RARE  . Hypertension   . Personal history of chemotherapy 2017/2018   F/U left breast cancer  . Personal history of radiation therapy 2018   F/U left breast cancer  . PONV (postoperative nausea and vomiting)   . RSD (reflex sympathetic dystrophy)   . Seasonal allergies   . Skin cancer   . Urine incontinence     Past Surgical History:  Procedure Laterality Date  . ABDOMINAL HYSTERECTOMY  2005   total/ Dr Davis Gourd  . BLADDER TUMOR EXCISION  2006  . BREAST BIOPSY Left 03/13/2016   INVASIVE MAMMARY CARCINOMA  . BREAST LUMPECTOMY Left 2018  . BREAST LUMPECTOMY WITH SENTINEL LYMPH NODE BIOPSY Left 07/14/2016   Procedure: BREAST LUMPECTOMY WITH SENTINEL LYMPH NODE BX;  Surgeon: [name], MD;  Location: [facility] ORS;  Service: General;  Laterality: Left;  . denture surgery    . OOPHORECTOMY    . PORTACATH PLACEMENT N/A 04/01/2016   Procedure: INSERTION PORT-A-CATH;  Surgeon: [name], MD;  Location: [facility];  Service: General;  Laterality: N/A;  . SKIN CANCER EXCISION Right 2015   foot/ Dr [place] Crane  . TUBAL LIGATION    . TUMOR EXCISION Right    FOOT    Family History  Problem Relation Age of Onset  . Emphysema Father   . Heart disease Mother   . Transient ischemic attack Mother   . Breast cancer Cousin        paternal  . Kidney cancer Neg Hx   . Bladder Cancer Neg Hx     Social History Social History   Tobacco Use  . Smoking status: Former Smoker    Packs/day: 1.00    Years: 20.00    Pack years:  20.00    Types: Cigarettes    Last attempt to quit: 04/14/1994    Years since quitting: 23.4  . Smokeless tobacco: Never Used  Substance Use Topics  . Alcohol use: No    Alcohol/week: 0.0 oz  . Drug use: No    Allergies  Allergen Reactions  . Bee Pollen Hives  . Mosquito (Diagnostic) Hives  . Other Other (See Comments)    Uncoded Allergy. Allergen: VICRYL  (90% glycolide and 10% L-lactide [polyglactin-- synthetic absorbable sterile surgical suture]), Other Reaction: Infection  . Gabapentin Swelling  . Norvasc [Amlodipine Besylate] Swelling    TO FEET  . Lisinopril Cough    Current Outpatient Medications  Medication Sig Dispense Refill  . acetaminophen (TYLENOL) 500 MG tablet Take 1,000 mg by mouth every 6 (six) hours as needed for moderate pain.     . Ascorbic Acid (VITAMIN C) 1000 MG tablet Take 1,000 mg by mouth daily.    . Calcium Carb-Cholecalciferol (CALCIUM/VITAMIN D PO) Take 1,200 mg by mouth.    . CRANBERRY PO Take 1 capsule by mouth 2 (two) times daily.    . hydrochlorothiazide (HYDRODIURIL) 25 MG tablet TAKE 1 TABLET BY MOUTH ONCE  A DAY 90 tablet 1  . ibuprofen (ADVIL,MOTRIN) 200 MG tablet Take 200 mg by mouth every 6 (six) hours as needed.    Marland Kitchen letrozole (FEMARA) 2.5 MG tablet Take 1 tablet (2.5 mg total) by mouth daily. 90 tablet 3  . losartan (COZAAR) 50 MG tablet TAKE 1 TABLET BY MOUTH ONCE DAILY 90 tablet 1  . Probiotic Product (PROBIOTIC PO) Take 1 tablet by mouth daily.    . silver sulfADIAZINE (SILVADENE) 1 % cream Apply to affected area daily 50 g 1  . sulfamethoxazole-trimethoprim (BACTRIM DS,SEPTRA DS) 800-160 MG tablet Take 1 tablet by mouth 2 (two) times daily. 20 tablet 0   No current facility-administered medications for this visit.     Review of Systems Review of Systems  Constitutional: Negative.   Respiratory: Negative.   Cardiovascular: Negative.     Blood pressure 132/74, pulse 76, resp. rate 13, height 5\' 6"  (1.676 m), weight 164 lb (74.4  kg).  Physical Exam Physical Exam  Constitutional: She is oriented to person, place, and time. She appears well-developed.  Pulmonary/Chest:    Neurological: She is alert and oriented to person, place, and time.  Skin: Skin is warm and dry.    Data Reviewed July 16, 2017 culture: Staph aureus and staph epidermidis, both sensitive to Cipro, clindamycin, Bactrim and oxacillin.  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    Plan   Patient to return in one week for excision chest wall abscess.  The patient had been told she was allergic to Vicryl sutures, although these were used to close the original incision during port placement, breast surgery and port removal.  This present inflammatory process may be related to the capsule that formed around the port, or be totally unrelated.  Excising this should resolve the issue.  HPI, Physical Exam, Assessment and Plan have been scribed under the direction and in the presence of Hervey Ard, MD.  [name], CMA  I have completed the exam and reviewed the above documentation for accuracy and completeness.  I agree with the above.  Haematologist has been used and any errors in dictation or transcription are unintentional.  Hervey Ard, M.D., F.A.C.S.  [name] Byrnett 10/06/2017, 8:27 AM

## 2017-10-06 NOTE — Patient Instructions (Signed)
Patient to return in one week for excision chest wall.

## 2017-10-11 LAB — ANAEROBIC AND AEROBIC CULTURE

## 2017-10-14 ENCOUNTER — Encounter: Payer: Self-pay | Admitting: General Surgery

## 2017-10-14 ENCOUNTER — Ambulatory Visit (INDEPENDENT_AMBULATORY_CARE_PROVIDER_SITE_OTHER): Payer: PPO | Admitting: General Surgery

## 2017-10-14 VITALS — BP 114/64 | HR 120 | Resp 16 | Ht 67.0 in | Wt 163.0 lb

## 2017-10-14 DIAGNOSIS — L928 Other granulomatous disorders of the skin and subcutaneous tissue: Secondary | ICD-10-CM

## 2017-10-14 DIAGNOSIS — L98499 Non-pressure chronic ulcer of skin of other sites with unspecified severity: Secondary | ICD-10-CM | POA: Diagnosis not present

## 2017-10-14 DIAGNOSIS — L02213 Cutaneous abscess of chest wall: Secondary | ICD-10-CM

## 2017-10-14 NOTE — Progress Notes (Signed)
Patient ID: Catherine Tate, female   DOB: 04-Mar-1941, 77 y.o.   MRN: [id number]  Chief Complaint  Patient presents with  . Procedure    HPI Catherine Tate is a 77 y.o. female.  Here for excision chest wall wound.  HPI  Past Medical History:  Diagnosis Date  . Arthritis   . Cancer ([place]) 03/13/2016   INVASIVE MAMMARY CARCINOMA  . Chickenpox   . Complication of anesthesia   . Frequent UTI   . GERD (gastroesophageal reflux disease)    RARE  . Hypertension   . Personal history of chemotherapy 2017/2018   F/U left breast cancer  . Personal history of radiation therapy 2018   F/U left breast cancer  . PONV (postoperative nausea and vomiting)   . RSD (reflex sympathetic dystrophy)   . Seasonal allergies   . Skin cancer   . Urine incontinence     Past Surgical History:  Procedure Laterality Date  . ABDOMINAL HYSTERECTOMY  2005   total/ Dr Davis Gourd  . BLADDER TUMOR EXCISION  2006  . BREAST BIOPSY Left 03/13/2016   INVASIVE MAMMARY CARCINOMA  . BREAST LUMPECTOMY Left 2018  . BREAST LUMPECTOMY WITH SENTINEL LYMPH NODE BIOPSY Left 07/14/2016   Procedure: BREAST LUMPECTOMY WITH SENTINEL LYMPH NODE BX;  Surgeon: [name], MD;  Location: [facility] ORS;  Service: General;  Laterality: Left;  . denture surgery    . OOPHORECTOMY    . PORTACATH PLACEMENT N/A 04/01/2016   Procedure: INSERTION PORT-A-CATH;  Surgeon: [name], MD;  Location: [facility] OR[facility];  Service: General;  Laterality: N/A;  . SKIN CANCER EXCISION Right 2015   foot/ Dr [place] Crane  . TUBAL LIGATION    . TUMOR EXCISION Right    FOOT    Family History  Problem Relation Age of Onset  . Emphysema Father   . Heart disease Mother   . Transient ischemic attack Mother   . Breast cancer Cousin        paternal  . Kidney cancer Neg Hx   . Bladder Cancer Neg Hx     Social History Social History   Tobacco Use  . Smoking status: Former Smoker    Packs/day: 1.00    Years: 20.00    Pack years: 20.00    Types:  Cigarettes    Last attempt to quit: 04/14/1994    Years since quitting: 23.5  . Smokeless tobacco: Never Used  Substance Use Topics  . Alcohol use: No    Alcohol/week: 0.0 oz  . Drug use: No    Allergies  Allergen Reactions  . Bee Pollen Hives  . Mosquito (Diagnostic) Hives  . Other Other (See Comments)    Uncoded Allergy. Allergen: VICRYL  (90% glycolide and 10% L-lactide [polyglactin-- synthetic absorbable sterile surgical suture]), Other Reaction: Infection  . Gabapentin Swelling  . Norvasc [Amlodipine Besylate] Swelling    TO FEET  . Lisinopril Cough    Current Outpatient Medications  Medication Sig Dispense Refill  . acetaminophen (TYLENOL) 500 MG tablet Take 1,000 mg by mouth every 6 (six) hours as needed for moderate pain.     . Ascorbic Acid (VITAMIN C) 1000 MG tablet Take 1,000 mg by mouth daily.    . Calcium Carb-Cholecalciferol (CALCIUM/VITAMIN D PO) Take 1,200 mg by mouth.    . CRANBERRY PO Take 1 capsule by mouth 2 (two) times daily.    . hydrochlorothiazide (HYDRODIURIL) 25 MG tablet TAKE 1 TABLET BY MOUTH ONCE A DAY 90 tablet 1  .  ibuprofen (ADVIL,MOTRIN) 200 MG tablet Take 200 mg by mouth every 6 (six) hours as needed.    Marland Kitchen letrozole (FEMARA) 2.5 MG tablet Take 1 tablet (2.5 mg total) by mouth daily. 90 tablet 3  . losartan (COZAAR) 50 MG tablet TAKE 1 TABLET BY MOUTH ONCE DAILY 90 tablet 1  . Probiotic Product (PROBIOTIC PO) Take 1 tablet by mouth daily.    . silver sulfADIAZINE (SILVADENE) 1 % cream Apply to affected area daily 50 g 1   No current facility-administered medications for this visit.     Review of Systems Review of Systems  Constitutional: Negative.   Respiratory: Negative.   Cardiovascular: Negative.     Blood pressure 114/64, pulse (!) 120, resp. rate 16, height 5\' 7"  (1.702 m), weight 163 lb (73.9 kg).  Physical Exam Physical Exam  Constitutional: She is oriented to person, place, and time. She appears well-developed and well-nourished.   Pulmonary/Chest:    Neurological: She is alert and oriented to person, place, and time.  Skin: Skin is warm and dry.  Psychiatric: Her behavior is normal.    Data Reviewed October 06, 2017 culture was negative.  Assessment    Chronic wound failing to respond to conservative treatment.    Plan    The area was cleansed with Betadine solution followed by 20 cc of 0.5% Xylocaine with 0.25% Marcaine with 1 to 200,000 units of epinephrine.  After suitable waiting time the area was reprepped with Betadine solution and draped.  The area was excised sharply through an elliptical incision approximately 5 cm in length.  Bleeding was controlled with thermal cautery.  The wound was approximately 1 cm in depth.  This was approximated with interrupted 4-0 Prolene horizontal mattress sutures, with "bites" of the deep tissue to eliminate dead space.  A dry dressing with Telfa and Tegaderm was applied.  Ice pack provided.  The patient will make use of Tylenol or Advil for pain. Pathology pending Suture removal 2 weeks     HPI, Physical Exam, Assessment and Plan have been scribed under the direction and in the presence of [name], MD. Karie Fetch, RN  I have completed the exam and reviewed the above documentation for accuracy and completeness.  I agree with the above.  Haematologist has been used and any errors in dictation or transcription are unintentional.  Hervey Ard, M.D., F.A.C.S.  Catherine Tate 10/14/2017, 4:18 PM

## 2017-10-14 NOTE — Patient Instructions (Addendum)
The patient is aware to call back for any questions or new concerns. May shower May remove dressing in 2-3 days May use an Ice pack as needed for comfort Return for suture removal 2 weeks

## 2017-10-19 ENCOUNTER — Ambulatory Visit (INDEPENDENT_AMBULATORY_CARE_PROVIDER_SITE_OTHER): Payer: PPO | Admitting: Family Medicine

## 2017-10-19 ENCOUNTER — Encounter: Payer: Self-pay | Admitting: Family Medicine

## 2017-10-19 VITALS — BP 144/84 | HR 123 | Temp 98.6°F | Ht 67.0 in | Wt 164.0 lb

## 2017-10-19 DIAGNOSIS — R300 Dysuria: Secondary | ICD-10-CM

## 2017-10-19 DIAGNOSIS — L02213 Cutaneous abscess of chest wall: Secondary | ICD-10-CM | POA: Diagnosis not present

## 2017-10-19 LAB — POC URINALSYSI DIPSTICK (AUTOMATED)
Bilirubin, UA: NEGATIVE
GLUCOSE UA: NEGATIVE
Ketones, UA: NEGATIVE
NITRITE UA: POSITIVE
PROTEIN UA: NEGATIVE
RBC UA: NEGATIVE
Spec Grav, UA: 1.02 (ref 1.010–1.025)
UROBILINOGEN UA: 0.2 U/dL
pH, UA: 6 (ref 5.0–8.0)

## 2017-10-19 MED ORDER — CEPHALEXIN 500 MG PO CAPS
500.0000 mg | ORAL_CAPSULE | Freq: Two times a day (BID) | ORAL | 0 refills | Status: DC
Start: 2017-10-19 — End: 2017-10-26

## 2017-10-19 NOTE — Assessment & Plan Note (Signed)
Looks improved based on prev report. Continue as is.  She agrees.

## 2017-10-19 NOTE — Progress Notes (Signed)
She had chest lesion abscess mgmt by Dr. Bary Castilla.  She is off abx.  Lesion is clearly improved per patient report.   She has h/o tachycardia with MD visits at baseline, this is not atypical for patient.    Dysuria: yes duration of symptoms: about 1 week abdominal pain:no fevers:no back pain: not other than some mild R sciatica pain episodically.  Vomiting: no This felt typical for patient with UTI, compared to prev.   Used azo last week, with some temporary relief  Meds, vitals, and allergies reviewed.  Per HPI unless specifically indicated in ROS section   GEN: nad, alert and oriented HEENT: mucous membranes moist NECK: supple CV: tachy but regular PULM: ctab, no inc wob ABD: soft, +bs, suprapubic area not tender EXT: no edema SKIN: no acute rash, chest wall lesion clean dry and intact w/o redness.   BACK: no CVA pain

## 2017-10-19 NOTE — Assessment & Plan Note (Signed)
Presumed UTI, start keflex, routine cautions d/w pt.  See avs.  Check ucx.

## 2017-10-19 NOTE — Patient Instructions (Signed)
Drink plenty of water and start the antibiotics today.  We'll contact you with your lab report.  Take care.   

## 2017-10-21 ENCOUNTER — Telehealth: Payer: Self-pay | Admitting: *Deleted

## 2017-10-21 LAB — URINE CULTURE
MICRO NUMBER:: 90805127
SPECIMEN QUALITY: ADEQUATE

## 2017-10-21 NOTE — Telephone Encounter (Signed)
-----   Message from [name], MD sent at 10/21/2017  2:46 PM EDT ----- Please let the patient know that the laboratory report did not show any evidence of cancer, just inflamed tissue.  Follow-up as scheduled. ----- Message ----- From: Interface, Lab In [facility] [place] Sent: 10/21/2017  12:05 PM To: [name], MD

## 2017-10-21 NOTE — Telephone Encounter (Signed)
Notified patient son, Gwyndolyn Saxon, as instructed, patient son pleased and will let her know. Discussed follow-up appointments next week, patient son agrees

## 2017-10-26 ENCOUNTER — Other Ambulatory Visit: Payer: Self-pay | Admitting: Family Medicine

## 2017-10-26 NOTE — Telephone Encounter (Signed)
Electronic refill request. Cephalexin Last office visit:   10/19/17 Last Filled:    14 capsule 0 10/19/2017  Patient says she is still burning a little bit, not as bad and has diarrhea today. Please advise.

## 2017-10-27 ENCOUNTER — Ambulatory Visit (INDEPENDENT_AMBULATORY_CARE_PROVIDER_SITE_OTHER): Payer: PPO | Admitting: *Deleted

## 2017-10-27 DIAGNOSIS — L02213 Cutaneous abscess of chest wall: Secondary | ICD-10-CM

## 2017-10-27 NOTE — Patient Instructions (Signed)
The patient is aware to call back for any questions or concerns.  

## 2017-10-27 NOTE — Telephone Encounter (Signed)
Extend the keflex, take an OTC probiotic and f/u prn.  Thanks. rx sent.

## 2017-10-27 NOTE — Progress Notes (Signed)
Patient ID: Catherine Tate, female   DOB: 1940/09/25, 77 y.o.   MRN: [id number]  \Patient came in today for a wound check.  The wound is clean, with no signs of infection noted. The sutures were removed and steri strips applied. Follow up as scheduled.

## 2017-10-27 NOTE — Telephone Encounter (Signed)
Left detailed message on voicemail.  

## 2017-11-11 ENCOUNTER — Other Ambulatory Visit: Payer: Self-pay | Admitting: Oncology

## 2017-11-11 ENCOUNTER — Encounter: Payer: Self-pay | Admitting: Family Medicine

## 2017-11-11 ENCOUNTER — Ambulatory Visit (INDEPENDENT_AMBULATORY_CARE_PROVIDER_SITE_OTHER): Payer: PPO | Admitting: Family Medicine

## 2017-11-11 ENCOUNTER — Other Ambulatory Visit: Payer: Self-pay | Admitting: Primary Care

## 2017-11-11 VITALS — BP 140/82 | HR 107 | Temp 98.2°F | Ht 67.0 in | Wt 163.2 lb

## 2017-11-11 DIAGNOSIS — R300 Dysuria: Secondary | ICD-10-CM

## 2017-11-11 DIAGNOSIS — N390 Urinary tract infection, site not specified: Secondary | ICD-10-CM

## 2017-11-11 DIAGNOSIS — I10 Essential (primary) hypertension: Secondary | ICD-10-CM

## 2017-11-11 LAB — POC URINALSYSI DIPSTICK (AUTOMATED)
BILIRUBIN UA: NEGATIVE
Glucose, UA: NEGATIVE
Ketones, UA: NEGATIVE
NITRITE UA: NEGATIVE
PH UA: 6 (ref 5.0–8.0)
Protein, UA: NEGATIVE
RBC UA: NEGATIVE
Spec Grav, UA: 1.015 (ref 1.010–1.025)
UROBILINOGEN UA: 0.2 U/dL

## 2017-11-11 MED ORDER — CEPHALEXIN 500 MG PO CAPS
ORAL_CAPSULE | ORAL | 0 refills | Status: DC
Start: 2017-11-11 — End: 2017-12-10

## 2017-11-11 NOTE — Patient Instructions (Addendum)
Keep the skin spot covered and clean.  Use neosporin and a nonstick bandage.  Rinse with soapy water but don't scrub and don't use peroxide or alcohol.   Drink plenty of water and start the antibiotics today.  We'll contact you with your lab report.  Take care.

## 2017-11-11 NOTE — Assessment & Plan Note (Signed)
Start keflex for presumed UTI.  Check ucx.  See avs.  Nontoxic. dw pt.  She agrees.  Routine supportive care.  See avs about chest wall- appears to be healing with only routine care needed. She agrees.

## 2017-11-11 NOTE — Progress Notes (Signed)
Recent UCX positive and treated with keflex.  She got better.   Dysuria: yes duration of symptoms: about 1 week abdominal pain: no fevers:no back pain:no vomiting:no She leaks urine at baseline.   She used AZO yesterday with some relief.    She is still healing from the prev chest wall lesion.  It isn't tender.  Minimal drainage, getting less and less.    Meds, vitals, and allergies reviewed.   Per HPI unless specifically indicated in ROS section   GEN: nad, alert and oriented HEENT: mucous membranes moist NECK: supple CV: rrr.  PULM: ctab, no inc wob ABD: soft, +bs, suprapubic area not tender EXT: no edema SKIN: well perfused.  Chest wall with 7x27mm healing area within prev scar.  Looks to have mild irritation but not infection.   Has normal granulation tissue.   BACK: no CVA pain

## 2017-11-12 LAB — URINE CULTURE
MICRO NUMBER:: 90905524
SPECIMEN QUALITY:: ADEQUATE

## 2017-11-16 ENCOUNTER — Ambulatory Visit
Admission: RE | Admit: 2017-11-16 | Discharge: 2017-11-16 | Disposition: A | Discharge status: Home / Self Care | Payer: PPO | Source: Ambulatory Visit | Attending: Oncology | Admitting: Oncology

## 2017-11-16 DIAGNOSIS — C50412 Malignant neoplasm of upper-outer quadrant of left female breast: Secondary | ICD-10-CM | POA: Diagnosis present

## 2017-11-16 DIAGNOSIS — M810 Age-related osteoporosis without current pathological fracture: Secondary | ICD-10-CM | POA: Insufficient documentation

## 2017-11-16 DIAGNOSIS — M8588 Other specified disorders of bone density and structure, other site: Secondary | ICD-10-CM | POA: Diagnosis not present

## 2017-11-16 DIAGNOSIS — Z853 Personal history of malignant neoplasm of breast: Secondary | ICD-10-CM | POA: Insufficient documentation

## 2017-11-16 DIAGNOSIS — Z79811 Long term (current) use of aromatase inhibitors: Secondary | ICD-10-CM | POA: Insufficient documentation

## 2017-11-16 DIAGNOSIS — Z170 Estrogen receptor positive status [ER+]: Secondary | ICD-10-CM | POA: Diagnosis not present

## 2017-11-27 ENCOUNTER — Encounter: Payer: Self-pay | Admitting: Radiation Oncology

## 2017-11-27 ENCOUNTER — Other Ambulatory Visit: Payer: Self-pay

## 2017-11-27 ENCOUNTER — Ambulatory Visit
Admission: RE | Admit: 2017-11-27 | Discharge: 2017-11-27 | Disposition: A | Discharge status: Home / Self Care | Payer: PPO | Source: Ambulatory Visit | Attending: Radiation Oncology | Admitting: Radiation Oncology

## 2017-11-27 VITALS — BP 140/86 | HR 90 | Temp 96.7°F | Resp 18 | Wt 164.1 lb

## 2017-11-27 DIAGNOSIS — C50412 Malignant neoplasm of upper-outer quadrant of left female breast: Secondary | ICD-10-CM | POA: Diagnosis not present

## 2017-11-27 DIAGNOSIS — Z170 Estrogen receptor positive status [ER+]: Principal | ICD-10-CM

## 2017-11-27 NOTE — Progress Notes (Signed)
Radiation Oncology Follow up Note  Name: Catherine Tate   Date:   11/27/2017 MRN:  [id number] DOB: August 10, 1940    This 77 y.o. 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 Seen today in routine follow-up she is doing fairly well she has been having some problems with.a chest wall abscess which has been debridement multiple times by Dr. Tollie Pizza. She specifically denies any breast tenderness cough or bone pain.she's currently on Femara tolerate that well without side effect.she had mammograms in April which I have reviewed which were BI-RADS 2 benign.  COMPLICATIONS OF TREATMENT: none  FOLLOW UP COMPLIANCE: keeps appointments   PHYSICAL EXAM:  BP 140/86 (BP Location: Right Arm, Patient Position: Sitting)   Pulse 90   Temp (!) 96.7 F (35.9 C) (Tympanic)   Resp 18   Wt 164 lb 2.1 oz (74.5 kg)   BMI 25.71 kg/m  Lungs are clear to A&P cardiac examination essentially unremarkable with regular rate and rhythm. No dominant mass or nodularity is noted in either breast in 2 positions examined. Incision is well-healed. No axillary or supraclavicular adenopathy is appreciated. Cosmetic result is excellent.she does have an area of bandaged in the mass of the right chest wall which is being treated by Dr. Myrna Blazer well-nourished patient in NAD. HEENT reveals PERLA, EOMI, discs not visualized.  Oral cavity is clear. No oral mucosal lesions are identified. Neck is clear without evidence of cervical or supraclavicular adenopathy. Lungs are clear to A&P. Cardiac examination is essentially unremarkable with regular rate and rhythm without murmur rub or thrill. Abdomen is  benign with no organomegaly or masses noted. Motor sensory and DTR levels are equal and symmetric in the upper and lower extremities. Cranial nerves II through XII are grossly intact. Proprioception is intact. No peripheral adenopathy or edema is identified. No motor or sensory levels are noted. Crude visual fields are within normal range.  RADIOLOGY RESULTS: mammograms are reviewed and compatible with the above-stated findings  PLAN: present time she is doing well from a breast standpoint with no evidence of disease. I'm please were overall progress. I've asked to see her back in 1 year for follow-up. She continues on Femara without side effect. Patient knows to call with any concerns.  I would like to take this opportunity to thank you for allowing me to participate in the care of your patient.Noreene Filbert, MD

## 2017-12-10 ENCOUNTER — Ambulatory Visit (INDEPENDENT_AMBULATORY_CARE_PROVIDER_SITE_OTHER): Payer: PPO | Admitting: Internal Medicine

## 2017-12-10 ENCOUNTER — Encounter: Payer: Self-pay | Admitting: Internal Medicine

## 2017-12-10 VITALS — BP 138/84 | HR 108 | Temp 97.9°F | Wt 164.0 lb

## 2017-12-10 DIAGNOSIS — R300 Dysuria: Secondary | ICD-10-CM | POA: Diagnosis not present

## 2017-12-10 DIAGNOSIS — N390 Urinary tract infection, site not specified: Secondary | ICD-10-CM | POA: Diagnosis not present

## 2017-12-10 DIAGNOSIS — R350 Frequency of micturition: Secondary | ICD-10-CM

## 2017-12-10 LAB — POC URINALSYSI DIPSTICK (AUTOMATED)
Bilirubin, UA: NEGATIVE
Glucose, UA: NEGATIVE
KETONES UA: NEGATIVE
Nitrite, UA: POSITIVE
PH UA: 6 (ref 5.0–8.0)
PROTEIN UA: NEGATIVE
SPEC GRAV UA: 1.01 (ref 1.010–1.025)
UROBILINOGEN UA: 0.2 U/dL

## 2017-12-10 MED ORDER — AMOXICILLIN-POT CLAVULANATE 875-125 MG PO TABS: 1.0000 | ORAL_TABLET | Freq: Two times a day (BID) | ORAL | 0 refills | Status: DC

## 2017-12-10 NOTE — Progress Notes (Signed)
HPI  Pt presents to the clinic today with c/o urinary frequency and dysuria. She reports this started about 2 months ago. She was seen 7/8 by Dr. Damita Dunnings. Urine culture grew E Coli, treated with Keflex. She was seen 7/31 for the same by Dr. Damita Dunnings. Urine culture did not grow any specific bacteria, but she was treated with another course of Keflex. She reports as soon as she finished abx, her symptoms returned. She denies urgency, blood in urine or vaginal complaints. She denies fecal incontinence. She has tried increasing water intake and taking AZO with minimal relief.   Review of Systems  Past Medical History:  Diagnosis Date  . Arthritis   . 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 Chickenpox   . Complication of anesthesia   . Frequent UTI   . GERD (gastroesophageal reflux disease)    RARE  . Hypertension   . Personal history of chemotherapy 2017/2018   F/U left breast cancer  . Personal history of radiation therapy 2018   F/U left breast cancer  . PONV (postoperative nausea and vomiting)   . RSD (reflex sympathetic dystrophy)   . Seasonal allergies   . Skin cancer   . Urine incontinence     Family History  Problem Relation Age of Onset  . Emphysema Father   . Heart disease Mother   . Transient ischemic attack Mother   . Breast cancer Cousin        paternal  . Kidney cancer Neg Hx   . Bladder Cancer Neg Hx     Social History   Socioeconomic History  . Marital status: Single    Spouse name: Not on file  . Number of children: Not on file  . Years of education: Not on file  . Highest education level: Not on file  Occupational History  . Not on file  Social Needs  . Financial resource strain: Not on file  . Food insecurity:    Worry: Not on file    Inability: Not on file  . Transportation needs:    Medical: Not on file    Non-medical: Not on file  Tobacco Use  . Smoking status: Former Smoker    Packs/day: 1.00    Years: 20.00    Pack years:  20.00    Types: Cigarettes    Last attempt to quit: 04/14/1994    Years since quitting: 23.6  . Smokeless tobacco: Never Used  Substance and Sexual Activity  . Alcohol use: No    Alcohol/week: 0.0 standard drinks  . Drug use: No  . Sexual activity: Not on file  Lifestyle  . Physical activity:    Days per week: Not on file    Minutes per session: Not on file  . Stress: Not on file  Relationships  . Social connections:    Talks on phone: Not on file    Gets together: Not on file    Attends religious service: Not on file    Active member of club or organization: Not on file    Attends meetings of clubs or organizations: Not on file    Relationship status: Not on file  . Intimate partner violence:    Fear of current or ex partner: Not on file    Emotionally abused: Not on file    Physically abused: Not on file    Forced sexual activity: Not on file  Other Topics Concern  . Not on file  Social History Narrative  Single.   2 children, 1 grandchild.   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   Enjoys making jewelry, puzzle books, sewing, traveling.    Allergies  Allergen Reactions  . Bee Pollen Hives  . Mosquito (Diagnostic) Hives  . Other Other (See Comments)    Uncoded Allergy. Allergen: VICRYL  (90% glycolide and 10% L-lactide [polyglactin-- synthetic absorbable sterile surgical suture]), Other Reaction: Infection  . Gabapentin Swelling  . Norvasc [Amlodipine Besylate] Swelling    TO FEET  . Lisinopril Cough     Constitutional: Denies fever, malaise, fatigue, headache or abrupt weight changes.   GU: Pt reports frequency and pain with urination. Denies urgency, burning sensation, blood in urine, odor or discharge.   No other specific complaints in a complete review of systems (except as listed in HPI above).    Objective:   Physical Exam  BP 138/84   Pulse (!) 108   Temp 97.9 F (36.6 C) (Oral)   Wt 164 lb (74.4 kg)   SpO2 98%   BMI 25.69 kg/m  Wt  Readings from Last 3 Encounters:  12/10/17 164 lb (74.4 kg)  11/27/17 164 lb 2.1 oz (74.5 kg)  11/11/17 163 lb 4 oz (74 kg)    General: Appears her stated age, well developed, well nourished in NAD. Cardiovascular: Tachycardic.  Pulmonary/Chest: Normal effort and positive vesicular breath sounds. No respiratory distress. No wheezes, rales or ronchi noted.  Abdomen: Soft. Normal bowel sounds. No distention or masses noted.  Tender to palpation over the bladder area. No CVA tenderness.        Assessment & Plan:   Frequency, Dysuria secondary to Presumed UTI:  Urinalysis: 3+ leuks, positive nitrites, 1+ blood Will send urine culture eRx sent if for Augmentin 1tab BID x 7 days OK to take AZO OTC Drink plenty of fluids Discuss use of Premarin cream with oncololgy/PCP to see if it will help prevent recurrent UTI's  RTC as needed or if symptoms persist. Webb Silversmith, NP

## 2017-12-10 NOTE — Addendum Note (Signed)
Addended by: Lurlean Nanny on: 12/10/2017 01:59 PM   Modules accepted: Orders

## 2017-12-10 NOTE — Patient Instructions (Signed)

## 2017-12-12 LAB — URINE CULTURE
MICRO NUMBER: 91035859
SPECIMEN QUALITY: ADEQUATE

## 2017-12-17 ENCOUNTER — Other Ambulatory Visit: Payer: Self-pay | Admitting: Internal Medicine

## 2017-12-22 ENCOUNTER — Other Ambulatory Visit: Payer: Self-pay | Admitting: Primary Care

## 2017-12-22 ENCOUNTER — Ambulatory Visit (INDEPENDENT_AMBULATORY_CARE_PROVIDER_SITE_OTHER): Payer: PPO | Admitting: Internal Medicine

## 2017-12-22 ENCOUNTER — Encounter: Payer: Self-pay | Admitting: Internal Medicine

## 2017-12-22 VITALS — BP 134/76 | HR 102 | Temp 98.1°F | Wt 161.0 lb

## 2017-12-22 DIAGNOSIS — R300 Dysuria: Secondary | ICD-10-CM | POA: Diagnosis not present

## 2017-12-22 DIAGNOSIS — N390 Urinary tract infection, site not specified: Secondary | ICD-10-CM

## 2017-12-22 DIAGNOSIS — I10 Essential (primary) hypertension: Secondary | ICD-10-CM

## 2017-12-22 LAB — POC URINALSYSI DIPSTICK (AUTOMATED)
BILIRUBIN UA: NEGATIVE
GLUCOSE UA: NEGATIVE
KETONES UA: NEGATIVE
Nitrite, UA: POSITIVE
PH UA: 6 (ref 5.0–8.0)
Protein, UA: NEGATIVE
Spec Grav, UA: 1.015 (ref 1.010–1.025)
Urobilinogen, UA: 0.2 E.U./dL

## 2017-12-22 MED ORDER — SULFAMETHOXAZOLE-TRIMETHOPRIM 400-80 MG PO TABS: 1.0000 | ORAL_TABLET | Freq: Two times a day (BID) | ORAL | 0 refills | Status: DC

## 2017-12-22 NOTE — Progress Notes (Signed)
HPI  Pt presents to the clinic today with c/o dysuria. This started 3-4 days ago. She denies urgency, frequency or blood in her urine. She denies vaginal complaints. She denies fever, chills, nausea or low back pain. She has increased fluid intake and taken AZO with some relief. She was last seen for the same 11/2817. Her last urine culture grew out Ecoli, sensitive to Augmentin.   Review of Systems  Past Medical History:  Diagnosis Date  . Arthritis   . Cancer ([place]) 03/13/2016   INVASIVE MAMMARY CARCINOMA  . Chickenpox   . Complication of anesthesia   . Frequent UTI   . GERD (gastroesophageal reflux disease)    RARE  . Hypertension   . Personal history of chemotherapy 2017/2018   F/U left breast cancer  . Personal history of radiation therapy 2018   F/U left breast cancer  . PONV (postoperative nausea and vomiting)   . RSD (reflex sympathetic dystrophy)   . Seasonal allergies   . Skin cancer   . Urine incontinence     Family History  Problem Relation Age of Onset  . Emphysema Father   . Heart disease Mother   . Transient ischemic attack Mother   . Breast cancer Cousin        paternal  . Kidney cancer Neg Hx   . Bladder Cancer Neg Hx     Social History   Socioeconomic History  . Marital status: Single    Spouse name: Not on file  . Number of children: Not on file  . Years of education: Not on file  . Highest education level: Not on file  Occupational History  . Not on file  Social Needs  . Financial resource strain: Not on file  . Food insecurity:    Worry: Not on file    Inability: Not on file  . Transportation needs:    Medical: Not on file    Non-medical: Not on file  Tobacco Use  . Smoking status: Former Smoker    Packs/day: 1.00    Years: 20.00    Pack years: 20.00    Types: Cigarettes    Last attempt to quit: 04/14/1994    Years since quitting: 23.7  . Smokeless tobacco: Never Used  Substance and Sexual Activity  . Alcohol use: No   Alcohol/week: 0.0 standard drinks  . Drug use: No  . Sexual activity: Not on file  Lifestyle  . Physical activity:    Days per week: Not on file    Minutes per session: Not on file  . Stress: Not on file  Relationships  . Social connections:    Talks on phone: Not on file    Gets together: Not on file    Attends religious service: Not on file    Active member of club or organization: Not on file    Attends meetings of clubs or organizations: Not on file    Relationship status: Not on file  . Intimate partner violence:    Fear of current or ex partner: Not on file    Emotionally abused: Not on file    Physically abused: Not on file    Forced sexual activity: Not on file  Other Topics Concern  . Not on file  Social History Narrative   Single.   2 children, 1 grandchild.   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   Enjoys making jewelry, puzzle books, sewing, traveling.    Allergies  Allergen Reactions  .  Bee Pollen Hives  . Mosquito (Diagnostic) Hives  . Other Other (See Comments)    Uncoded Allergy. Allergen: VICRYL  (90% glycolide and 10% L-lactide [polyglactin-- synthetic absorbable sterile surgical suture]), Other Reaction: Infection  . Gabapentin Swelling  . Norvasc [Amlodipine Besylate] Swelling    TO FEET  . Lisinopril Cough     Constitutional: Denies fever, malaise, fatigue, headache or abrupt weight changes.   GU: Pt reports pain with urination. Denies urgency, frequency, burning sensation, blood in urine, odor or discharge.    No other specific complaints in a complete review of systems (except as listed in HPI above).    Objective:   Physical Exam  BP 134/76   Pulse (!) 102   Temp 98.1 F (36.7 C) (Oral)   Wt 161 lb (73 kg)   SpO2 97%   BMI 25.22 kg/m   Wt Readings from Last 3 Encounters:  12/10/17 164 lb (74.4 kg)  11/27/17 164 lb 2.1 oz (74.5 kg)  11/11/17 163 lb 4 oz (74 kg)    General: Appears her stated age, well developed, well  nourished in NAD. Abdomen: Soft. Normal bowel sounds. No distention or masses noted.  Tender to palpation over the bladder area. No CVA tenderness.        Assessment & Plan:   Dysuria secondary to Recurrent UTI:  Urinalysis: 3+ leuks, pos nitrites, trace blood Will send urine culture eRx sent if for Septra 1 tab BID x 5 days OK to take AZO OTC Drink plenty of fluids  RTC as needed or if symptoms persist. Webb Silversmith, NP

## 2017-12-22 NOTE — Patient Instructions (Signed)

## 2017-12-24 LAB — URINE CULTURE
MICRO NUMBER:: 91081815
SPECIMEN QUALITY:: ADEQUATE

## 2017-12-28 ENCOUNTER — Ambulatory Visit: Payer: PPO | Admitting: Oncology

## 2018-01-10 NOTE — Progress Notes (Signed)
[place]  [phone] [phone]  ID: Catherine Tate OB: May 20, 1940  MR#: [id number]  AOZ#:[id number]  Patient Care Team: [name], NP as PCP - General (Internal Medicine) Bary Castilla, [name], MD (General Surgery) [name], MD as Consulting Physician [facility] Medicine)  CHIEF COMPLAINT: Pathologic stage IIa ER/PR positive, HER-2 negative invasive carcinoma of the upper outer quadrant of the left breast.  INTERVAL HISTORY: Patient returns to clinic today for routine six-month evaluation.  She continues to tolerate letrozole well without significant side effects.  She has no neurologic complaints.  She denies any recent fevers or illnesses. She has no chest pain or shortness of breath. She denies any nausea, vomiting, constipation, or diarrhea. She has no urinary complaints.  Patient feels at her baseline offers no specific complaints today.  REVIEW OF SYSTEMS:   Review of Systems  Constitutional: Negative.  Negative for fever, malaise/fatigue and weight loss.  HENT: Negative for sinus pain.   Respiratory: Negative.  Negative for cough and shortness of breath.   Cardiovascular: Negative.  Negative for chest pain and leg swelling.  Gastrointestinal: Negative.  Negative for abdominal pain, constipation and nausea.  Genitourinary: Negative for hematuria and urgency.  Musculoskeletal: Negative.   Skin: Negative.  Negative for rash.  Neurological: Negative.  Negative for sensory change, weakness and headaches.  Psychiatric/Behavioral: Negative.  The patient is not nervous/anxious and does not have insomnia.     As per HPI. Otherwise, a complete review of systems is negative.  PAST MEDICAL HISTORY: Past Medical History:  Diagnosis Date  . Arthritis   . Cancer ([place]) 03/13/2016   INVASIVE MAMMARY CARCINOMA  . Chickenpox   . Complication of anesthesia   . Frequent UTI   . GERD (gastroesophageal reflux disease)    RARE  .  Hypertension   . Personal history of chemotherapy 2017/2018   F/U left breast cancer  . Personal history of radiation therapy 2018   F/U left breast cancer  . PONV (postoperative nausea and vomiting)   . RSD (reflex sympathetic dystrophy)   . Seasonal allergies   . Skin cancer   . Urine incontinence     PAST SURGICAL HISTORY: Past Surgical History:  Procedure Laterality Date  . ABDOMINAL HYSTERECTOMY  2005   total/ Dr Davis Gourd  . BLADDER TUMOR EXCISION  2006  . BREAST BIOPSY Left 03/13/2016   INVASIVE MAMMARY CARCINOMA  . BREAST LUMPECTOMY Left 2018  . BREAST LUMPECTOMY WITH SENTINEL LYMPH NODE BIOPSY Left 07/14/2016   Procedure: BREAST LUMPECTOMY WITH SENTINEL LYMPH NODE BX;  Surgeon: [name], MD;  Location: [facility] ORS;  Service: General;  Laterality: Left;  . denture surgery    . OOPHORECTOMY    . PORTACATH PLACEMENT N/A 04/01/2016   Procedure: INSERTION PORT-A-CATH;  Surgeon: [name], MD;  Location: [facility] ORS;  Service: General;  Laterality: N/A;  . SKIN CANCER EXCISION Right 2015   foot/ Dr Nevada Crane  . TUBAL LIGATION    . TUMOR EXCISION Right    FOOT    FAMILY HISTORY: Family History  Problem Relation Age of Onset  . Emphysema Father   . Heart disease Mother   . Transient ischemic attack Mother   . Breast cancer Cousin        paternal  . Kidney cancer Neg Hx   . Bladder Cancer Neg Hx     ADVANCED DIRECTIVES (Y/N):  N  HEALTH MAINTENANCE: Social History   Tobacco Use  . Smoking status:  Former Smoker    Packs/day: 1.00    Years: 20.00    Pack years: 20.00    Types: Cigarettes    Last attempt to quit: 04/14/1994    Years since quitting: 23.7  . Smokeless tobacco: Never Used  Substance Use Topics  . Alcohol use: No    Alcohol/week: 0.0 standard drinks  . Drug use: No     Colonoscopy:  PAP:  Bone density:  Lipid panel:  Allergies  Allergen Reactions  . Bee Pollen Hives  . Mosquito (Diagnostic) Hives  . Other Other (See Comments)     Uncoded Allergy. Allergen: VICRYL  (90% glycolide and 10% L-lactide [polyglactin-- synthetic absorbable sterile surgical suture]), Other Reaction: Infection  . Gabapentin Swelling  . Norvasc [Amlodipine Besylate] Swelling    TO FEET  . Lisinopril Cough    Current Outpatient Medications  Medication Sig Dispense Refill  . acetaminophen (TYLENOL) 500 MG tablet Take 1,000 mg by mouth every 6 (six) hours as needed for moderate pain.     . Ascorbic Acid (VITAMIN C) 1000 MG tablet Take 1,000 mg by mouth daily.    . Calcium Carb-Cholecalciferol (CALCIUM/VITAMIN D PO) Take 1,200 mg by mouth.    . CRANBERRY PO Take 1 capsule by mouth 2 (two) times daily.    . hydrochlorothiazide (HYDRODIURIL) 25 MG tablet TAKE 1 TABLET BY MOUTH ONCE DAILY 90 tablet 1  . ibuprofen (ADVIL,MOTRIN) 200 MG tablet Take 200 mg by mouth every 6 (six) hours as needed.    Marland Kitchen letrozole (FEMARA) 2.5 MG tablet TAKE 1 TABLET BY MOUTH ONCE A DAY 90 tablet 3  . losartan (COZAAR) 50 MG tablet TAKE 1 TABLET BY MOUTH ONCE DAILY 90 tablet 1  . Probiotic Product (PROBIOTIC PO) Take 1 tablet by mouth daily.    Marland Kitchen sulfamethoxazole-trimethoprim (BACTRIM,SEPTRA) 400-80 MG tablet Take 1 tablet by mouth 2 (two) times daily. 10 tablet 0  . alendronate (FOSAMAX) 70 MG tablet Take 1 tablet (70 mg total) by mouth once a week. Take with a full glass of water on an empty stomach. 4 tablet 6   No current facility-administered medications for this visit.     OBJECTIVE: Vitals:   01/11/18 1007 01/11/18 1010  BP:  121/77  Pulse:  (!) 111  Resp: 16   Temp:  98.2 F (36.8 C)     Body mass index is 25.66 kg/m.    ECOG FS:0 - Asymptomatic  General: Well-developed, well-nourished, no acute distress. Eyes: Pink conjunctiva, anicteric sclera. HEENT: Normocephalic, moist mucous membranes. Breast: Patient requested exam be deferred today. Lungs: Clear to auscultation bilaterally. Heart: Regular rate and rhythm. No rubs, murmurs, or gallops. Abdomen:  Soft, nontender, nondistended. No organomegaly noted, normoactive bowel sounds. Musculoskeletal: No edema, cyanosis, or clubbing. Neuro: Alert, answering all questions appropriately. Cranial nerves grossly intact. Skin: No rashes or petechiae noted. Psych: Normal affect.  LAB RESULTS:  Lab Results  Component Value Date   NA 137 12/22/2016   K 4.4 12/22/2016   CL 101 12/22/2016   CO2 28 12/22/2016   GLUCOSE 135 (H) 12/22/2016   BUN 21 (H) 12/22/2016   CREATININE 1.06 (H) 12/22/2016   CALCIUM 9.8 12/22/2016   PROT 7.5 12/22/2016   ALBUMIN 4.1 12/22/2016   AST 29 12/22/2016   ALT 37 12/22/2016   ALKPHOS 57 12/22/2016   BILITOT 0.7 12/22/2016   GFRNONAA 50 (L) 12/22/2016   GFRAA 58 (L) 12/22/2016    Lab Results  Component Value Date   WBC 5.0  12/22/2016   NEUTROABS 3.4 12/22/2016   HGB 12.4 12/22/2016   HCT 35.2 12/22/2016   MCV 92.8 12/22/2016   PLT 231 12/22/2016     STUDIES: No results found.  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  PLAN:    1. 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 She then underwent a lumpectomy on July 14, 2016 and completed adjuvant XRT. Continue letrozole for total 5 years completing in June 2023.  Patient's most recent mammogram on July 14, 2017 was reported as BI-RADS 2, repeat in April 2020.  Return to clinic in 6 months for routine evaluation.    2. Constipation: Patient does not complain of this today.  Continue OTC treatments as needed. 3. Decreased EF%: Patient had a pretreatment EF of 41%. Continue monitoring evaluation per cardiology. 4.  Osteoporosis: Patient's repeat bone mineral density on November 16, 2017 reported a T score of -2.5 which is considered osteoporosis.  This has decreased from 1 year  prior when the T score was reported -2.3.  Patient was given a prescription for Fosamax and instructed to continue her calcium and vitamin D supplementation.  Repeat bone marrow density in August 2020.  Patient expressed understanding and was in agreement with this plan. She also understands that She can call clinic at any time with any questions, concerns, or complaints.   Cancer Staging Malignant neoplasm of upper-outer quadrant of left female breast [facility]) Staging form: Breast, AJCC 7th Edition - Clinical stage from 03/19/2016: Stage IIA (T2, N0, M0) - Signed by Lloyd Huger, MD on 04/06/2016 - Pathologic stage from 07/27/2016: Stage IIA (T2, N0, cM0) - Signed by Lloyd Huger, MD on 07/27/2016    Lloyd Huger, MD 01/13/18 3:53 PM

## 2018-01-11 ENCOUNTER — Inpatient Hospital Stay: Payer: PPO | Attending: Oncology | Admitting: Oncology

## 2018-01-11 ENCOUNTER — Encounter: Payer: Self-pay | Admitting: Oncology

## 2018-01-11 ENCOUNTER — Other Ambulatory Visit: Payer: Self-pay

## 2018-01-11 VITALS — BP 121/77 | HR 111 | Temp 98.2°F | Resp 16 | Ht 66.0 in | Wt 159.0 lb

## 2018-01-11 DIAGNOSIS — K219 Gastro-esophageal reflux disease without esophagitis: Secondary | ICD-10-CM | POA: Diagnosis not present

## 2018-01-11 DIAGNOSIS — Z87891 Personal history of nicotine dependence: Secondary | ICD-10-CM | POA: Diagnosis not present

## 2018-01-11 DIAGNOSIS — Z9221 Personal history of antineoplastic chemotherapy: Secondary | ICD-10-CM | POA: Diagnosis not present

## 2018-01-11 DIAGNOSIS — C50412 Malignant neoplasm of upper-outer quadrant of left female breast: Secondary | ICD-10-CM | POA: Diagnosis not present

## 2018-01-11 DIAGNOSIS — I10 Essential (primary) hypertension: Secondary | ICD-10-CM | POA: Diagnosis not present

## 2018-01-11 DIAGNOSIS — Z79811 Long term (current) use of aromatase inhibitors: Secondary | ICD-10-CM | POA: Diagnosis not present

## 2018-01-11 DIAGNOSIS — Z85828 Personal history of other malignant neoplasm of skin: Secondary | ICD-10-CM | POA: Insufficient documentation

## 2018-01-11 DIAGNOSIS — Z79899 Other long term (current) drug therapy: Secondary | ICD-10-CM | POA: Diagnosis not present

## 2018-01-11 DIAGNOSIS — M810 Age-related osteoporosis without current pathological fracture: Secondary | ICD-10-CM | POA: Diagnosis not present

## 2018-01-11 DIAGNOSIS — M1990 Unspecified osteoarthritis, unspecified site: Secondary | ICD-10-CM | POA: Insufficient documentation

## 2018-01-11 DIAGNOSIS — Z923 Personal history of irradiation: Secondary | ICD-10-CM | POA: Diagnosis not present

## 2018-01-11 DIAGNOSIS — Z170 Estrogen receptor positive status [ER+]: Secondary | ICD-10-CM | POA: Insufficient documentation

## 2018-01-11 MED ORDER — ALENDRONATE SODIUM 70 MG PO TABS: 70.0000 mg | ORAL_TABLET | ORAL | 6 refills | Status: DC

## 2018-02-03 ENCOUNTER — Ambulatory Visit (INDEPENDENT_AMBULATORY_CARE_PROVIDER_SITE_OTHER): Payer: PPO

## 2018-02-03 ENCOUNTER — Ambulatory Visit: Payer: PPO

## 2018-02-03 VITALS — BP 122/84 | HR 105 | Temp 98.5°F | Ht 64.5 in | Wt 156.5 lb

## 2018-02-03 DIAGNOSIS — I10 Essential (primary) hypertension: Secondary | ICD-10-CM | POA: Diagnosis not present

## 2018-02-03 DIAGNOSIS — E785 Hyperlipidemia, unspecified: Secondary | ICD-10-CM

## 2018-02-03 DIAGNOSIS — Z0000 Encounter for general adult medical examination without abnormal findings: Secondary | ICD-10-CM

## 2018-02-03 DIAGNOSIS — E559 Vitamin D deficiency, unspecified: Secondary | ICD-10-CM

## 2018-02-03 DIAGNOSIS — R7309 Other abnormal glucose: Secondary | ICD-10-CM | POA: Diagnosis not present

## 2018-02-03 DIAGNOSIS — H269 Unspecified cataract: Secondary | ICD-10-CM

## 2018-02-03 HISTORY — DX: Unspecified cataract: H26.9

## 2018-02-03 LAB — CBC WITH DIFFERENTIAL/PLATELET
BASOS ABS: 0.1 10*3/uL (ref 0.0–0.1)
BASOS PCT: 1.2 % (ref 0.0–3.0)
Eosinophils Absolute: 0.1 10*3/uL (ref 0.0–0.7)
Eosinophils Relative: 1.7 % (ref 0.0–5.0)
HEMATOCRIT: 33.1 % — AB (ref 36.0–46.0)
Hemoglobin: 11 g/dL — ABNORMAL LOW (ref 12.0–15.0)
LYMPHS PCT: 22.6 % (ref 12.0–46.0)
Lymphs Abs: 1.7 10*3/uL (ref 0.7–4.0)
MCHC: 33.2 g/dL (ref 30.0–36.0)
MCV: 89.3 fl (ref 78.0–100.0)
MONOS PCT: 5.5 % (ref 3.0–12.0)
Monocytes Absolute: 0.4 10*3/uL (ref 0.1–1.0)
NEUTROS ABS: 5.3 10*3/uL (ref 1.4–7.7)
NEUTROS PCT: 69 % (ref 43.0–77.0)
PLATELETS: 305 10*3/uL (ref 150.0–400.0)
RBC: 3.71 Mil/uL — ABNORMAL LOW (ref 3.87–5.11)
RDW: 13.4 % (ref 11.5–15.5)
WBC: 7.7 10*3/uL (ref 4.0–10.5)

## 2018-02-03 LAB — TSH: TSH: 3.31 u[IU]/mL (ref 0.35–4.50)

## 2018-02-03 LAB — COMPREHENSIVE METABOLIC PANEL
ALT: 20 U/L (ref 0–35)
AST: 13 U/L (ref 0–37)
Albumin: 4.2 g/dL (ref 3.5–5.2)
Alkaline Phosphatase: 63 U/L (ref 39–117)
BUN: 24 mg/dL — AB (ref 6–23)
CO2: 28 meq/L (ref 19–32)
Calcium: 9.9 mg/dL (ref 8.4–10.5)
Chloride: 100 mEq/L (ref 96–112)
Creatinine, Ser: 1.19 mg/dL (ref 0.40–1.20)
GFR: 46.87 mL/min — ABNORMAL LOW (ref >60.00)
GLUCOSE: 128 mg/dL — AB (ref 70–99)
Potassium: 4.3 mEq/L (ref 3.5–5.1)
Sodium: 138 mEq/L (ref 135–145)
Total Bilirubin: 0.6 mg/dL (ref 0.2–1.2)
Total Protein: 7.7 g/dL (ref 6.0–8.3)

## 2018-02-03 LAB — LDL CHOLESTEROL, DIRECT: LDL DIRECT: 128 mg/dL

## 2018-02-03 LAB — LIPID PANEL
CHOL/HDL RATIO: 4
Cholesterol: 173 mg/dL (ref 0–200)
HDL: 40.7 mg/dL (ref >39.00)
LDL Cholesterol: 110 mg/dL — ABNORMAL HIGH (ref 0–99)
NONHDL: 132.68
TRIGLYCERIDES: 112 mg/dL (ref 0.0–149.0)
VLDL: 22.4 mg/dL (ref 0.0–40.0)

## 2018-02-03 LAB — VITAMIN D 25 HYDROXY (VIT D DEFICIENCY, FRACTURES): VITD: 32.44 ng/mL (ref 30.00–100.00)

## 2018-02-03 LAB — HEMOGLOBIN A1C: Hgb A1c MFr Bld: 6.1 % (ref 4.6–6.5)

## 2018-02-03 NOTE — Progress Notes (Signed)
I reviewed health advisor's note, was available for consultation, and agree with documentation and plan.  

## 2018-02-03 NOTE — Progress Notes (Signed)
Subjective:   Catherine Tate is a 77 y.o. female who presents for Medicare Annual (Subsequent) preventive examination.  Review of Systems:  N/A Cardiac Risk Factors include: advanced age (>67men, >50 women);dyslipidemia;hypertension     Objective:     Vitals: BP 122/84 (BP Location: Right Arm, Patient Position: Sitting, Cuff Size: Normal)   Pulse (!) 105   Temp 98.5 F (36.9 C) (Oral)   Ht 5' 4.5" (1.638 m) Comment: no shoes  Wt 156 lb 8 oz (71 kg)   SpO2 91%   BMI 26.45 kg/m   Body mass index is 26.45 kg/m.  Advanced Directives 02/03/2018 11/27/2017 06/22/2017 05/15/2017 02/02/2017 12/22/2016 11/06/2016  Does Patient Have a Medical Advance Directive? Yes Yes Yes Yes Yes Yes Yes  Type of Paramedic of [place];Living will [place];Living will [place];Living will [place];Living will [employer];Living will Living will;Healthcare Power of [place];Living will  Copy of [place] in Chart? Yes Yes Yes - Yes - -  Would patient like information on creating a medical advance directive? - No - Patient declined - No - Patient declined - - -    Tobacco Social History   Tobacco Use  Smoking Status Former Smoker  . Packs/day: 1.00  . Years: 20.00  . Pack years: 20.00  . Types: Cigarettes  . Last attempt to quit: 04/14/1994  . Years since quitting: 23.8  Smokeless Tobacco Never Used     Counseling given: No   Clinical Intake:  Pre-visit preparation completed: Yes  Pain : No/denies pain Pain Score: 0-No pain     Nutritional Status: BMI 25 -29 Overweight Nutritional Risks: None Diabetes: No  How often do you need to have someone help you when you read instructions, pamphlets, or other written materials from your doctor or pharmacy?: 1 - Never What is the last grade level you completed in school?: 12th grade  Interpreter  Needed?: No  Comments: pt lives alone Information entered by :: LPinson, LPN  Past Medical History:  Diagnosis Date  . Arthritis   . Cancer ([place]) 03/13/2016   INVASIVE MAMMARY CARCINOMA  . Cataract 02/03/2018  . Chickenpox   . Complication of anesthesia   . Frequent UTI   . GERD (gastroesophageal reflux disease)    RARE  . Hypertension   . Personal history of chemotherapy 2017/2018   F/U left breast cancer  . Personal history of radiation therapy 2018   F/U left breast cancer  . PONV (postoperative nausea and vomiting)   . RSD (reflex sympathetic dystrophy)   . Seasonal allergies   . Skin cancer   . Urine incontinence    Past Surgical History:  Procedure Laterality Date  . ABDOMINAL HYSTERECTOMY  2005   total/ Dr Davis Gourd  . BLADDER TUMOR EXCISION  2006  . BREAST BIOPSY Left 03/13/2016   INVASIVE MAMMARY CARCINOMA  . BREAST LUMPECTOMY Left 2018  . BREAST LUMPECTOMY WITH SENTINEL LYMPH NODE BIOPSY Left 07/14/2016   Procedure: BREAST LUMPECTOMY WITH SENTINEL LYMPH NODE BX;  Surgeon: [name], MD;  Location: [facility] ORS;  Service: General;  Laterality: Left;  . denture surgery    . OOPHORECTOMY    . PORTACATH PLACEMENT N/A 04/01/2016   Procedure: INSERTION PORT-A-CATH;  Surgeon: [name], MD;  Location: [facility] ORS;  Service: General;  Laterality: N/A;  . SKIN CANCER EXCISION Right 2015   foot/ Dr [place] Crane  .  TUBAL LIGATION    . TUMOR EXCISION Right    FOOT   Family History  Problem Relation Age of Onset  . Emphysema Father   . Heart disease Mother   . Transient ischemic attack Mother   . Breast cancer Cousin        paternal  . Kidney cancer Neg Hx   . Bladder Cancer Neg Hx    Social History   Socioeconomic History  . Marital status: Single    Spouse name: Not on file  . Number of children: Not on file  . Years of education: Not on file  . Highest education level: Not on file  Occupational History  . Not on file  Social Needs  . Financial  resource strain: Not on file  . Food insecurity:    Worry: Not on file    Inability: Not on file  . Transportation needs:    Medical: Not on file    Non-medical: Not on file  Tobacco Use  . Smoking status: Former Smoker    Packs/day: 1.00    Years: 20.00    Pack years: 20.00    Types: Cigarettes    Last attempt to quit: 04/14/1994    Years since quitting: 23.8  . Smokeless tobacco: Never Used  Substance and Sexual Activity  . Alcohol use: No    Alcohol/week: 0.0 standard drinks  . Drug use: No  . Sexual activity: Not Currently  Lifestyle  . Physical activity:    Days per week: Not on file    Minutes per session: Not on file  . Stress: Not on file  Relationships  . Social connections:    Talks on phone: Not on file    Gets together: Not on file    Attends religious service: Not on file    Active member of club or organization: Not on file    Attends meetings of clubs or organizations: Not on file    Relationship status: Not on file  Other Topics Concern  . Not on file  Social History Narrative   Single.   2 children, 1 grandchild.   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   Enjoys making jewelry, puzzle books, sewing, traveling.    Outpatient Encounter Medications as of 02/03/2018  Medication Sig  . acetaminophen (TYLENOL) 500 MG tablet Take 1,000 mg by mouth every 6 (six) hours as needed for moderate pain.   Marland Kitchen alendronate (FOSAMAX) 70 MG tablet Take 1 tablet (70 mg total) by mouth once a week. Take with a full glass of water on an empty stomach.  . Ascorbic Acid (VITAMIN C) 1000 MG tablet Take 1,000 mg by mouth daily.  . Calcium Carb-Cholecalciferol (CALCIUM/VITAMIN D PO) Take 1,200 mg by mouth.  . CRANBERRY PO Take 1 capsule by mouth 2 (two) times daily.  . hydrochlorothiazide (HYDRODIURIL) 25 MG tablet TAKE 1 TABLET BY MOUTH ONCE DAILY  . ibuprofen (ADVIL,MOTRIN) 200 MG tablet Take 200 mg by mouth every 6 (six) hours as needed.  Marland Kitchen letrozole (FEMARA) 2.5 MG  tablet TAKE 1 TABLET BY MOUTH ONCE A DAY  . losartan (COZAAR) 50 MG tablet TAKE 1 TABLET BY MOUTH ONCE DAILY  . Probiotic Product (PROBIOTIC PO) Take 1 tablet by mouth daily.  Marland Kitchen sulfamethoxazole-trimethoprim (BACTRIM,SEPTRA) 400-80 MG tablet Take 1 tablet by mouth 2 (two) times daily. (Patient not taking: Reported on 02/03/2018)   No facility-administered encounter medications on file as of 02/03/2018.     Activities of Daily Living  In your present state of health, do you have any difficulty performing the following activities: 02/03/2018  Hearing? N  Vision? Y  Difficulty concentrating or making decisions? N  Walking or climbing stairs? N  Dressing or bathing? N  Doing errands, shopping? N  Preparing Food and eating ? N  Using the Toilet? N  In the past six months, have you accidently leaked urine? Y  Do you have problems with loss of bowel control? N  Managing your Medications? N  Managing your Finances? N  Housekeeping or managing your Housekeeping? N  Some recent data might be hidden    Patient Care Team: [name], NP as PCP - General (Internal Medicine) Bary Castilla, [name], MD (General Surgery) [name], MD as Consulting Physician (Family Medicine)    Assessment:   This is a routine wellness examination for [place].   Hearing Screening   125Hz  250Hz  500Hz  1000Hz  2000Hz  3000Hz  4000Hz  6000Hz  8000Hz   Right ear:   40 0 40  0    Left ear:   40 40 40  0    Vision Screening Comments: Vision exam in 2019 @ [facility] and Dietary recommendations Current Exercise Habits: The patient does not participate in regular exercise at present, Exercise limited by: None identified  Goals    . Increase water intake     Starting 02/03/2018, I will attempt to drink at least 6-8 glasses of water daily.        Fall Risk Fall Risk  02/03/2018 02/02/2017  Falls in the past year? No No   Depression Screen PHQ 2/9 Scores 02/03/2018  02/02/2017 11/05/2015  PHQ - 2 Score 0 0 0  PHQ- 9 Score 0 0 -     Cognitive Function MMSE - Mini Mental State Exam 02/03/2018 02/02/2017  Orientation to time 5 5  Orientation to Place 5 5  Registration 3 3  Attention/ Calculation 0 0  Recall 3 2  Recall-comments - unable to recall 1 of 3 words  Language- name 2 objects 0 0  Language- repeat 1 1  Language- follow 3 step command 3 2  Language- follow 3 step command-comments - unable to follow 1 step of 3 step command  Language- read & follow direction 0 0  Write a sentence 0 0  Copy design 0 0  Total score 20 18     PLEASE NOTE: A Mini-Cog screen was completed. Maximum score is 20. A value of 0 denotes this part of [place] MMSE was not completed or the patient failed this part of the Mini-Cog screening.   Mini-Cog Screening Orientation to Time - Max 5 pts Orientation to Place - Max 5 pts Registration - Max 3 pts Recall - Max 3 pts Language Repeat - Max 1 pts Language Follow 3 Step Command - Max 3 pts     Immunization History  Administered Date(s) Administered  . Influenza,inj,Quad PF,6+ Mos 03/03/2016, 02/02/2017, 01/11/2018  . Pneumococcal Conjugate-13 11/05/2015  . Pneumococcal Polysaccharide-23 02/09/2017  . Tdap 09/10/2010  . Zoster 04/15/1999    Screening Tests Health Maintenance  Topic Date Due  . TETANUS/TDAP  09/09/2020  . INFLUENZA VACCINE  Completed  . DEXA SCAN  Completed  . PNA vac Low Risk Adult  Completed       Plan:     I have personally reviewed, addressed, and noted the following in the patient's chart:  A. Medical and social history B. Use of alcohol, tobacco or  illicit drugs  C. Current medications and supplements D. Functional ability and status E.  Nutritional status F.  Physical activity G. Advance directives H. List of other physicians I.  Hospitalizations, surgeries, and ER visits in previous 12 months J.  [place] to include hearing, vision, cognitive,  depression L. Referrals and appointments - none  In addition, I have reviewed and discussed with patient certain preventive protocols, quality metrics, and best practice recommendations. A written personalized care plan for preventive services as well as general preventive health recommendations were provided to patient.  See attached scanned questionnaire for additional information.   Signed,   Lindell Noe, MHA, BS, LPN Health Coach

## 2018-02-03 NOTE — Patient Instructions (Signed)
[name] , Thank you for taking time to come for your Medicare Wellness Visit. I appreciate your ongoing commitment to your health goals. Please review the following plan we discussed and let me know if I can assist you in the future.   These are the goals we discussed: Goals    . Increase water intake     Starting 02/03/2018, I will attempt to drink at least 6-8 glasses of water daily.        This is a list of the screening recommended for you and due dates:  Health Maintenance  Topic Date Due  . Tetanus Vaccine  09/09/2020  . Flu Shot  Completed  . DEXA scan (bone density measurement)  Completed  . Pneumonia vaccines  Completed   Preventive Care for Adults  A healthy lifestyle and preventive care can promote health and wellness. Preventive health guidelines for adults include the following key practices.  . A routine yearly physical is a good way to check with your health care provider about your health and preventive screening. It is a chance to share any concerns and updates on your health and to receive a thorough exam.  . Visit your dentist for a routine exam and preventive care every 6 months. Brush your teeth twice a day and floss once a day. Good oral hygiene prevents tooth decay and gum disease.  . The frequency of eye exams is based on your age, health, family medical history, use  of contact lenses, and other factors. Follow your health care provider's recommendations for frequency of eye exams.  . Eat a healthy diet. Foods like vegetables, fruits, whole grains, low-fat dairy products, and lean protein foods contain the nutrients you need without too many calories. Decrease your intake of foods high in solid fats, added sugars, and salt. Eat the right amount of calories for you. Get information about a proper diet from your health care provider, if necessary.  . Regular physical exercise is one of the most important things you can do for your health. Most adults should  get at least 150 minutes of moderate-intensity exercise (any activity that increases your heart rate and causes you to sweat) each week. In addition, most adults need muscle-strengthening exercises on 2 or more days a week.  Silver Sneakers may be a benefit available to you. To determine eligibility, you may visit the website: www.silversneakers.com or contact program at 640-819-4131 Mon-Fri between 8AM-8PM.   . Maintain a healthy weight. The body mass index (BMI) is a screening tool to identify possible weight problems. It provides an estimate of body fat based on height and weight. Your health care provider can find your BMI and can help you achieve or maintain a healthy weight.   For adults 20 years and older: ? A BMI below 18.5 is considered underweight. ? A BMI of 18.5 to 24.9 is normal. ? A BMI of 25 to 29.9 is considered overweight. ? A BMI of 30 and above is considered obese.   . Maintain normal blood lipids and cholesterol levels by exercising and minimizing your intake of saturated fat. Eat a balanced diet with plenty of fruit and vegetables. Blood tests for lipids and cholesterol should begin at age 37 and be repeated every 5 years. If your lipid or cholesterol levels are high, you are over 50, or you are at high risk for heart disease, you may need your cholesterol levels checked more frequently. Ongoing high lipid and cholesterol levels should be treated with  medicines if diet and exercise are not working.  . If you smoke, find out from your health care provider how to quit. If you do not use tobacco, please do not start.  . If you choose to drink alcohol, please do not consume more than 2 drinks per day. One drink is considered to be 12 ounces (355 mL) of beer, 5 ounces (148 mL) of wine, or 1.5 ounces (44 mL) of liquor.  . If you are 62-32 years old, ask your health care provider if you should take aspirin to prevent strokes.  . Use sunscreen. Apply sunscreen liberally and  repeatedly throughout the day. You should seek shade when your shadow is shorter than you. Protect yourself by wearing long sleeves, pants, a wide-brimmed hat, and sunglasses year round, whenever you are outdoors.  . Once a month, do a whole body skin exam, using a mirror to look at the skin on your back. Tell your health care provider of new moles, moles that have irregular borders, moles that are larger than a pencil eraser, or moles that have changed in shape or color.

## 2018-02-03 NOTE — Progress Notes (Signed)
PCP notes:   Health maintenance:  No gaps identified.   Abnormal screenings:   Hearing - failed  Hearing Screening   125Hz  250Hz  500Hz  1000Hz  2000Hz  3000Hz  4000Hz  6000Hz  8000Hz   Right ear:   40 0 40  0    Left ear:   40 40 40  0     Patient concerns:   None  Nurse concerns:  None  Next PCP appt:   02/10/2018 @ 0900

## 2018-02-10 ENCOUNTER — Ambulatory Visit (INDEPENDENT_AMBULATORY_CARE_PROVIDER_SITE_OTHER): Payer: PPO | Admitting: Primary Care

## 2018-02-10 ENCOUNTER — Encounter: Payer: Self-pay | Admitting: Primary Care

## 2018-02-10 VITALS — BP 136/82 | HR 112 | Temp 98.2°F | Ht 64.5 in | Wt 159.8 lb

## 2018-02-10 DIAGNOSIS — C50412 Malignant neoplasm of upper-outer quadrant of left female breast: Secondary | ICD-10-CM

## 2018-02-10 DIAGNOSIS — L02213 Cutaneous abscess of chest wall: Secondary | ICD-10-CM

## 2018-02-10 DIAGNOSIS — I10 Essential (primary) hypertension: Secondary | ICD-10-CM | POA: Diagnosis not present

## 2018-02-10 DIAGNOSIS — N952 Postmenopausal atrophic vaginitis: Secondary | ICD-10-CM

## 2018-02-10 DIAGNOSIS — Z170 Estrogen receptor positive status [ER+]: Secondary | ICD-10-CM | POA: Diagnosis not present

## 2018-02-10 DIAGNOSIS — E785 Hyperlipidemia, unspecified: Secondary | ICD-10-CM

## 2018-02-10 DIAGNOSIS — N289 Disorder of kidney and ureter, unspecified: Secondary | ICD-10-CM

## 2018-02-10 DIAGNOSIS — N183 Chronic kidney disease, stage 3 unspecified: Secondary | ICD-10-CM | POA: Insufficient documentation

## 2018-02-10 DIAGNOSIS — N390 Urinary tract infection, site not specified: Secondary | ICD-10-CM | POA: Diagnosis not present

## 2018-02-10 DIAGNOSIS — M810 Age-related osteoporosis without current pathological fracture: Secondary | ICD-10-CM

## 2018-02-10 DIAGNOSIS — Z0000 Encounter for general adult medical examination without abnormal findings: Secondary | ICD-10-CM | POA: Diagnosis not present

## 2018-02-10 LAB — POC URINALSYSI DIPSTICK (AUTOMATED)
BILIRUBIN UA: NEGATIVE
Blood, UA: NEGATIVE
GLUCOSE UA: NEGATIVE
Ketones, UA: NEGATIVE
Nitrite, UA: NEGATIVE
Protein, UA: NEGATIVE
SPEC GRAV UA: 1.015 (ref 1.010–1.025)
Urobilinogen, UA: 0.2 E.U./dL
pH, UA: 6 (ref 5.0–8.0)

## 2018-02-10 MED ORDER — ESTROGENS, CONJUGATED 0.625 MG/GM VA CREA: TOPICAL_CREAM | VAGINAL | 0 refills | Status: DC

## 2018-02-10 NOTE — Assessment & Plan Note (Signed)
Frequent symptoms of dysuria, history of recurrent UTI. UA today with 1+ leuks, negative nitrites and blood. Culture sent.   Rx for premarin cream sent to pharmacy as recurrent UTI could very well be secondary to vaginal atrophy from menopause.

## 2018-02-10 NOTE — Addendum Note (Signed)
Addended by: Jacqualin Combes on: 02/10/2018 09:37 AM   Modules accepted: Orders

## 2018-02-10 NOTE — Assessment & Plan Note (Signed)
Improving, healing wound to right anterior chest.

## 2018-02-10 NOTE — Assessment & Plan Note (Signed)
Noted on bone density test from August 2019. Compliant to Fosamax, calcium, vitamin D. Continue same. Repeat bone density scan in 2020.

## 2018-02-10 NOTE — Assessment & Plan Note (Signed)
Stable in the office today, continue HCTZ 25 mg, losartan 50 mg. BMP reviewed.

## 2018-02-10 NOTE — Assessment & Plan Note (Signed)
Immunizations UTD. Mammogram UTD. Declines colonoscopy. Bone Density testing UTD. Recommended regular exercise, healthy diet. Exam stable.  Labs reviewed. Follow up in 1 year for CPE.

## 2018-02-10 NOTE — Assessment & Plan Note (Signed)
Continues to decline statin therapy for lipid control and protection against heart disease. Continue to monitor.   The 10-year ASCVD risk score Catherine Tate., et al., 2013) is: 25.3%   Values used to calculate the score:     Age: 77 years     Sex: Female     Is Non-Hispanic African American: No     Diabetic: No     Tobacco smoker: No     Systolic Blood Pressure: 798 mmHg     Is BP treated: Yes     HDL Cholesterol: 40.7 mg/dL     Total Cholesterol: 173 mg/dL

## 2018-02-10 NOTE — Patient Instructions (Signed)
Start exercising. You should be getting 150 minutes of exercise weekly.  It's important to improve your diet by reducing consumption of fast food, fried food, processed snack foods, sugary drinks. Increase consumption of fresh vegetables and fruits, whole grains, water.  Ensure you are drinking 64 ounces of water daily.  Continue taking your calcium and vitamin D, also your Fosamax.  Schedule a lab only appointment to return in 2 months for kidney check.   It was a pleasure to see you today!

## 2018-02-10 NOTE — Assessment & Plan Note (Signed)
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 Managed on ARB. She is not managed on NSAID's. Will start by having her hydrate properly with water. Continue to monitor renal function, repeat in 2 months.

## 2018-02-10 NOTE — Assessment & Plan Note (Signed)
Following with oncology, compliant to letrozole 2.5 mg daily. Mammogram UTD.

## 2018-02-10 NOTE — Progress Notes (Signed)
Subjective:    Patient ID: Catherine Tate, female    DOB: 04/12/1941, 77 y.o.   MRN: [id number]  HPI  Catherine Tate is a 77 year old female who presents today for complete physical.  Complaints of dysuria intermittently for months, history of recurrent UTI. She has contacted her oncologist regarding using Premarin Cream, she was told that she could use the cream but could not take the oral tablet.   Immunizations: -Tetanus: Completed in 2012 -Influenza: Completed this season -Pneumonia: Completed last in 2018 -Shingles: Completed   Diet: She endorses a fair diet.  Breakfast: Cereal, oatmeal, eggs, bacon occasionally  Lunch: Soup, sandwiches Dinner: Vegetables, protein, starch  Snacks: Nuts, fruit, sandwich Desserts: Candy, pie. 1-2 times weekly  Beverages: Coffee, water, occasional juice  Exercise: She is not exercising Eye exam: Completed in 2019 Dental exam: No recent exam Colonoscopy: Never completed, declines Dexa: Completed in 2019, osteoporosis. On alendronate.  Mammogram: Completed in April 2019. 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  BP Readings from Last 3 Encounters:  02/10/18 136/82  02/03/18 122/84  01/11/18 121/77     Review of Systems  Constitutional: Negative for unexpected weight change.  HENT: Negative for rhinorrhea.   Respiratory: Negative for cough and shortness of breath.   Cardiovascular: Negative for chest pain.  Gastrointestinal: Negative for constipation and diarrhea.  Genitourinary: Positive for dysuria. Negative for difficulty urinating.  Musculoskeletal: Negative for arthralgias and myalgias.  Skin: Negative for rash.  Allergic/Immunologic: Negative for environmental allergies.  Neurological: Negative for dizziness, numbness and headaches.  Psychiatric/Behavioral: The patient is not nervous/anxious.        Past Medical History:  Diagnosis Date  . Arthritis    . Cancer ([place]) 03/13/2016   INVASIVE MAMMARY CARCINOMA  . Cataract 02/03/2018  . Chickenpox   . Complication of anesthesia   . Frequent UTI   . GERD (gastroesophageal reflux disease)    RARE  . Hypertension   . Personal history of chemotherapy 2017/2018   F/U left breast cancer  . Personal history of radiation therapy 2018   F/U left breast cancer  . PONV (postoperative nausea and vomiting)   . RSD (reflex sympathetic dystrophy)   . Seasonal allergies   . Skin cancer   . Urine incontinence      Social History   Socioeconomic History  . Marital status: Single    Spouse name: Not on file  . Number of children: Not on file  . Years of education: Not on file  . Highest education level: Not on file  Occupational History  . Not on file  Social Needs  . Financial resource strain: Not on file  . Food insecurity:    Worry: Not on file    Inability: Not on file  . Transportation needs:    Medical: Not on file    Non-medical: Not on file  Tobacco Use  . Smoking status: Former Smoker    Packs/day: 1.00    Years: 20.00    Pack years: 20.00    Types: Cigarettes    Last attempt to quit: 04/14/1994    Years since quitting: 23.8  . Smokeless tobacco: Never Used  Substance and Sexual Activity  . Alcohol use: No    Alcohol/week: 0.0 standard drinks  . Drug use: No  . Sexual activity: Not Currently  Lifestyle  . Physical activity:    Days per week: Not on file  Minutes per session: Not on file  . Stress: Not on file  Relationships  . Social connections:    Talks on phone: Not on file    Gets together: Not on file    Attends religious service: Not on file    Active member of club or organization: Not on file    Attends meetings of clubs or organizations: Not on file    Relationship status: Not on file  . Intimate partner violence:    Fear of current or ex partner: Not on file    Emotionally abused: Not on file    Physically abused: Not on file    Forced sexual  activity: Not on file  Other Topics Concern  . Not on file  Social History Narrative   Single.   2 children, 1 grandchild.   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   Enjoys making jewelry, puzzle books, sewing, traveling.    Past Surgical History:  Procedure Laterality Date  . ABDOMINAL HYSTERECTOMY  2005   total/ Dr Davis Gourd  . BLADDER TUMOR EXCISION  2006  . BREAST BIOPSY Left 03/13/2016   INVASIVE MAMMARY CARCINOMA  . BREAST LUMPECTOMY Left 2018  . BREAST LUMPECTOMY WITH SENTINEL LYMPH NODE BIOPSY Left 07/14/2016   Procedure: BREAST LUMPECTOMY WITH SENTINEL LYMPH NODE BX;  Surgeon: [name], MD;  Location: [facility] ORS;  Service: General;  Laterality: Left;  . denture surgery    . OOPHORECTOMY    . PORTACATH PLACEMENT N/A 04/01/2016   Procedure: INSERTION PORT-A-CATH;  Surgeon: [name], MD;  Location: [facility] ORS;  Service: General;  Laterality: N/A;  . SKIN CANCER EXCISION Right 2015   foot/ Dr Nevada Crane  . TUBAL LIGATION    . TUMOR EXCISION Right    FOOT    Family History  Problem Relation Age of Onset  . Emphysema Father   . Heart disease Mother   . Transient ischemic attack Mother   . Breast cancer Cousin        paternal  . Kidney cancer Neg Hx   . Bladder Cancer Neg Hx     Allergies  Allergen Reactions  . Bee Pollen Hives  . Mosquito (Diagnostic) Hives  . Other Other (See Comments)    Uncoded Allergy. Allergen: VICRYL  (90% glycolide and 10% L-lactide [polyglactin-- synthetic absorbable sterile surgical suture]), Other Reaction: Infection  . Gabapentin Swelling  . Norvasc [Amlodipine Besylate] Swelling    TO FEET  . Lisinopril Cough    Current Outpatient Medications on File Prior to Visit  Medication Sig Dispense Refill  . acetaminophen (TYLENOL) 500 MG tablet Take 1,000 mg by mouth every 6 (six) hours as needed for moderate pain.     Marland Kitchen alendronate (FOSAMAX) 70 MG tablet Take 1 tablet (70 mg total) by mouth once a week. Take with a full  glass of water on an empty stomach. 4 tablet 6  . Ascorbic Acid (VITAMIN C) 1000 MG tablet Take 1,000 mg by mouth daily.    . Calcium Carb-Cholecalciferol (CALCIUM/VITAMIN D PO) Take 1,200 mg by mouth.    . CRANBERRY PO Take 1 capsule by mouth 2 (two) times daily.    . hydrochlorothiazide (HYDRODIURIL) 25 MG tablet TAKE 1 TABLET BY MOUTH ONCE DAILY 90 tablet 1  . letrozole (FEMARA) 2.5 MG tablet TAKE 1 TABLET BY MOUTH ONCE A DAY 90 tablet 3  . losartan (COZAAR) 50 MG tablet TAKE 1 TABLET BY MOUTH ONCE DAILY 90 tablet 1  . Probiotic  Product (PROBIOTIC PO) Take 1 tablet by mouth daily.     No current facility-administered medications on file prior to visit.     BP 136/82   Pulse (!) 112   Temp 98.2 F (36.8 C) (Oral)   Ht 5' 4.5" (1.638 m)   Wt 159 lb 12 oz (72.5 kg)   SpO2 97%   BMI 27.00 kg/m    Objective:   Physical Exam  Constitutional: She is oriented to person, place, and time. She appears well-nourished.  HENT:  Mouth/Throat: No oropharyngeal exudate.  Eyes: Pupils are equal, round, and reactive to light. EOM are normal.  Neck: Neck supple. No thyromegaly present.  Cardiovascular: Normal rate and regular rhythm.  Respiratory: Effort normal and breath sounds normal.  GI: Soft. Bowel sounds are normal. There is no tenderness.  Musculoskeletal: Normal range of motion.  Neurological: She is alert and oriented to person, place, and time.  Skin: Skin is warm and dry.  Healing wound to right anterior chest measuring 2.5 cm long, 0.5 cm in width.  Psychiatric: She has a normal mood and affect.           Assessment & Plan:

## 2018-02-12 ENCOUNTER — Other Ambulatory Visit: Payer: Self-pay | Admitting: Primary Care

## 2018-02-12 DIAGNOSIS — N3000 Acute cystitis without hematuria: Secondary | ICD-10-CM

## 2018-02-12 LAB — URINE CULTURE
MICRO NUMBER:: 91306284
SPECIMEN QUALITY: ADEQUATE

## 2018-02-12 MED ORDER — CEPHALEXIN 500 MG PO CAPS: 500.0000 mg | ORAL_CAPSULE | Freq: Two times a day (BID) | ORAL | 0 refills | Status: AC

## 2018-03-09 ENCOUNTER — Encounter: Payer: Self-pay | Admitting: Internal Medicine

## 2018-03-09 ENCOUNTER — Ambulatory Visit (INDEPENDENT_AMBULATORY_CARE_PROVIDER_SITE_OTHER): Payer: PPO | Admitting: Internal Medicine

## 2018-03-09 VITALS — BP 126/80 | HR 78 | Temp 98.2°F | Wt 159.0 lb

## 2018-03-09 DIAGNOSIS — R3989 Other symptoms and signs involving the genitourinary system: Secondary | ICD-10-CM | POA: Diagnosis not present

## 2018-03-09 DIAGNOSIS — R300 Dysuria: Secondary | ICD-10-CM

## 2018-03-09 LAB — POC URINALSYSI DIPSTICK (AUTOMATED)
BILIRUBIN UA: NEGATIVE
Blood, UA: NEGATIVE
GLUCOSE UA: NEGATIVE
KETONES UA: NEGATIVE
LEUKOCYTES UA: NEGATIVE
Nitrite, UA: NEGATIVE
Protein, UA: NEGATIVE
SPEC GRAV UA: 1.015 (ref 1.010–1.025)
Urobilinogen, UA: 0.2 E.U./dL
pH, UA: 6 (ref 5.0–8.0)

## 2018-03-09 MED ORDER — AMOXICILLIN-POT CLAVULANATE 875-125 MG PO TABS: 1.0000 | ORAL_TABLET | Freq: Two times a day (BID) | ORAL | 0 refills | Status: DC

## 2018-03-09 NOTE — Addendum Note (Signed)
Addended by: Lurlean Nanny on: 03/09/2018 02:54 PM   Modules accepted: Orders

## 2018-03-09 NOTE — Progress Notes (Signed)
HPI  Pt presents to the clinic today with c/o bladder pressure and dysuria. She reports this started. She reports the bladder pressure has improved some but the nocturia has not. It also seems worse at night. She denies fever, chills, nausea or low back pain. She has tried AZO with minimal relief. She has recurrent UTI's, last urine culture grew out E Coli, treated with 7 day course of Keflex, 02/10/18. She was started on Premarin cream but is not sure if she is using this correctly.   Review of Systems  Past Medical History:  Diagnosis Date  . Arthritis   . Cancer ([place]) 03/13/2016   INVASIVE MAMMARY CARCINOMA  . Cataract 02/03/2018  . Chickenpox   . Complication of anesthesia   . Frequent UTI   . GERD (gastroesophageal reflux disease)    RARE  . Hypertension   . Personal history of chemotherapy 2017/2018   F/U left breast cancer  . Personal history of radiation therapy 2018   F/U left breast cancer  . PONV (postoperative nausea and vomiting)   . RSD (reflex sympathetic dystrophy)   . Seasonal allergies   . Skin cancer   . Urine incontinence     Family History  Problem Relation Age of Onset  . Emphysema Father   . Heart disease Mother   . Transient ischemic attack Mother   . Breast cancer Cousin        paternal  . Kidney cancer Neg Hx   . Bladder Cancer Neg Hx     Social History   Socioeconomic History  . Marital status: Single    Spouse name: Not on file  . Number of children: Not on file  . Years of education: Not on file  . Highest education level: Not on file  Occupational History  . Not on file  Social Needs  . Financial resource strain: Not on file  . Food insecurity:    Worry: Not on file    Inability: Not on file  . Transportation needs:    Medical: Not on file    Non-medical: Not on file  Tobacco Use  . Smoking status: Former Smoker    Packs/day: 1.00    Years: 20.00    Pack years: 20.00    Types: Cigarettes    Last attempt to quit: 04/14/1994     Years since quitting: 23.9  . Smokeless tobacco: Never Used  Substance and Sexual Activity  . Alcohol use: No    Alcohol/week: 0.0 standard drinks  . Drug use: No  . Sexual activity: Not Currently  Lifestyle  . Physical activity:    Days per week: Not on file    Minutes per session: Not on file  . Stress: Not on file  Relationships  . Social connections:    Talks on phone: Not on file    Gets together: Not on file    Attends religious service: Not on file    Active member of club or organization: Not on file    Attends meetings of clubs or organizations: Not on file    Relationship status: Not on file  . Intimate partner violence:    Fear of current or ex partner: Not on file    Emotionally abused: Not on file    Physically abused: Not on file    Forced sexual activity: Not on file  Other Topics Concern  . Not on file  Social History Narrative   Single.   2 children, 1 grandchild.  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   Enjoys making jewelry, puzzle books, sewing, traveling.    Allergies  Allergen Reactions  . Bee Pollen Hives  . Mosquito (Diagnostic) Hives  . Other Other (See Comments)    Uncoded Allergy. Allergen: VICRYL  (90% glycolide and 10% L-lactide [polyglactin-- synthetic absorbable sterile surgical suture]), Other Reaction: Infection  . Gabapentin Swelling  . Norvasc [Amlodipine Besylate] Swelling    TO FEET  . Lisinopril Cough     Constitutional: Denies fever, malaise, fatigue, headache or abrupt weight changes.   GU: Pt reports bladder pressure and pain with urination. Denies urgency, frequency, burning sensation, blood in urine, odor or discharge. Skin: Denies redness, rashes, lesions or ulcercations.   No other specific complaints in a complete review of systems (except as listed in HPI above).    Objective:   Physical Exam  BP 126/80   Pulse 78   Temp 98.2 F (36.8 C) (Oral)   Wt 159 lb (72.1 kg)   BMI 26.87 kg/m   Wt Readings  from Last 3 Encounters:  02/10/18 159 lb 12 oz (72.5 kg)  02/03/18 156 lb 8 oz (71 kg)  01/11/18 159 lb (72.1 kg)    General: Appears her stated age, well developed, well nourished in NAD. Cardiovascular: Normal rate and rhythm. S1,S2 noted.   Pulmonary/Chest: Normal effort and positive vesicular breath sounds. No respiratory distress. No wheezes, rales or ronchi noted.  Abdomen: Soft. Normal bowel sounds. No distention or masses noted.  Tender to palpation over the bladder area. No CVA tenderness.        Assessment & Plan:   Bladder Pressure, Dysuria secondary to Recurrent UTI:  Urinalysis: normal Will send urine culture Given the fact that urine culture will be back on Thursday and office is closed Thursday and Friday due to thanksgiving, will treat with Augmentin BID x 7 days OK to take AZO OTC Drink plenty of fluids  RTC as needed or if symptoms persist. Webb Silversmith, NP

## 2018-03-09 NOTE — Patient Instructions (Signed)

## 2018-03-10 LAB — URINE CULTURE
MICRO NUMBER:: 91424914
SPECIMEN QUALITY:: ADEQUATE

## 2018-04-05 ENCOUNTER — Other Ambulatory Visit: Payer: Self-pay | Admitting: Primary Care

## 2018-04-05 DIAGNOSIS — I10 Essential (primary) hypertension: Secondary | ICD-10-CM

## 2018-04-05 DIAGNOSIS — E785 Hyperlipidemia, unspecified: Secondary | ICD-10-CM

## 2018-04-13 ENCOUNTER — Other Ambulatory Visit (INDEPENDENT_AMBULATORY_CARE_PROVIDER_SITE_OTHER): Payer: PPO

## 2018-04-13 DIAGNOSIS — E785 Hyperlipidemia, unspecified: Secondary | ICD-10-CM | POA: Diagnosis not present

## 2018-04-13 DIAGNOSIS — I10 Essential (primary) hypertension: Secondary | ICD-10-CM

## 2018-04-13 LAB — LIPID PANEL
CHOL/HDL RATIO: 5
Cholesterol: 184 mg/dL (ref 0–200)
HDL: 39.4 mg/dL (ref >39.00)
LDL Cholesterol: 111 mg/dL — ABNORMAL HIGH (ref 0–99)
NonHDL: 144.81
Triglycerides: 169 mg/dL — ABNORMAL HIGH (ref 0.0–149.0)
VLDL: 33.8 mg/dL (ref 0.0–40.0)

## 2018-04-13 LAB — BASIC METABOLIC PANEL
BUN: 22 mg/dL (ref 6–23)
CHLORIDE: 102 meq/L (ref 96–112)
CO2: 26 meq/L (ref 19–32)
Calcium: 9.3 mg/dL (ref 8.4–10.5)
Creatinine, Ser: 1.14 mg/dL (ref 0.40–1.20)
GFR: 49.23 mL/min — ABNORMAL LOW (ref >60.00)
Glucose, Bld: 119 mg/dL — ABNORMAL HIGH (ref 70–99)
POTASSIUM: 4 meq/L (ref 3.5–5.1)
Sodium: 137 mEq/L (ref 135–145)

## 2018-04-16 ENCOUNTER — Other Ambulatory Visit: Payer: Self-pay | Admitting: Primary Care

## 2018-04-16 DIAGNOSIS — E785 Hyperlipidemia, unspecified: Secondary | ICD-10-CM

## 2018-04-16 DIAGNOSIS — N390 Urinary tract infection, site not specified: Secondary | ICD-10-CM

## 2018-04-16 MED ORDER — ATORVASTATIN CALCIUM 10 MG PO TABS: 10.0000 mg | ORAL_TABLET | Freq: Every day | ORAL | 3 refills | Status: DC

## 2018-04-16 MED ORDER — ESTRADIOL 0.1 MG/GM VA CREA: 1.0000 g | TOPICAL_CREAM | VAGINAL | 0 refills | Status: DC

## 2018-05-04 ENCOUNTER — Other Ambulatory Visit: Payer: Self-pay | Admitting: Primary Care

## 2018-05-04 DIAGNOSIS — I10 Essential (primary) hypertension: Secondary | ICD-10-CM

## 2018-05-05 ENCOUNTER — Encounter: Payer: Self-pay | Admitting: Primary Care

## 2018-05-05 ENCOUNTER — Ambulatory Visit (INDEPENDENT_AMBULATORY_CARE_PROVIDER_SITE_OTHER): Payer: PPO | Admitting: Primary Care

## 2018-05-05 VITALS — BP 136/82 | HR 113 | Temp 98.0°F | Ht 64.5 in | Wt 159.5 lb

## 2018-05-05 DIAGNOSIS — N390 Urinary tract infection, site not specified: Secondary | ICD-10-CM | POA: Diagnosis not present

## 2018-05-05 DIAGNOSIS — L02213 Cutaneous abscess of chest wall: Secondary | ICD-10-CM | POA: Diagnosis not present

## 2018-05-05 DIAGNOSIS — R300 Dysuria: Secondary | ICD-10-CM

## 2018-05-05 LAB — POC URINALSYSI DIPSTICK (AUTOMATED)
Bilirubin, UA: NEGATIVE
Blood, UA: NEGATIVE
Glucose, UA: NEGATIVE
Ketones, UA: NEGATIVE
Nitrite, UA: POSITIVE
Protein, UA: NEGATIVE
Spec Grav, UA: 1.015 (ref 1.010–1.025)
Urobilinogen, UA: 0.2 E.U./dL
pH, UA: 7 (ref 5.0–8.0)

## 2018-05-05 NOTE — Assessment & Plan Note (Signed)
No infection or recurrent abscess on exam today. Obvious lack of wound closure. Recommended she follow back up with her surgeon or we send her to wound clinic for evaluation. She will call surgeons office and update.

## 2018-05-05 NOTE — Patient Instructions (Signed)
We will be in touch once we receive your urine culture.  Call your surgeon's office, tell them no obvious infection. I would like for them to take a look at the site.   Continue using the Premarin cream twice weekly for UTI prevention.  Make sure to drink plenty of water.   It was a pleasure to see you today!

## 2018-05-05 NOTE — Progress Notes (Signed)
Subjective:    Patient ID: Catherine Tate, female    DOB: 1940-09-08, 78 y.o.   MRN: [id number]  HPI  Catherine Tate is a 78 year old female with a history of recurrent UTI, chest wall abscess, breast cancer who presents today with multiple complaints.  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   Since her chest excision her wound hasn't closed up. She denies fevers, drainage, pain. She's concerned given the open area on her chest.   2) UTI: Recurrent. Last evaluated on 03/09/18 with complaints of pressure and dysuria. She was initiated on Premarin cream in late October 2019 due to recurrent UTI as she refused to see Urology. During her last visit her UA was negative, however, was treated with a seven day course of Augmentin. Culture returned and was negative for infection.  Recent symptoms include dysuria. She took some AZO several weeks ago, also taking a cranberry tablet. She has been compliant to her vaginal estrogen cream twice weekly. She denies flank pain, hematuria. Overall doing better.   Review of Systems  Constitutional: Negative for fever.  Gastrointestinal: Negative for abdominal pain.  Genitourinary: Positive for dysuria. Negative for flank pain, hematuria and vaginal discharge.  Skin: Positive for wound. Negative for color change.       Past Medical History:  Diagnosis Date  . Arthritis   . Cancer ([place]) 03/13/2016   INVASIVE MAMMARY CARCINOMA  . Cataract 02/03/2018  . Chickenpox   . Complication of anesthesia   . Frequent UTI   . GERD (gastroesophageal reflux disease)    RARE  . Hypertension   . Personal history of chemotherapy 2017/2018   F/U left breast cancer  . Personal history of radiation therapy 2018   F/U left breast cancer  . PONV (postoperative nausea and vomiting)   . RSD (reflex sympathetic dystrophy)   . Seasonal allergies   . Skin cancer   . Urine incontinence      Social History    Socioeconomic History  . Marital status: Single    Spouse name: Not on file  . Number of children: Not on file  . Years of education: Not on file  . Highest education level: Not on file  Occupational History  . Not on file  Social Needs  . Financial resource strain: Not on file  . Food insecurity:    Worry: Not on file    Inability: Not on file  . Transportation needs:    Medical: Not on file    Non-medical: Not on file  Tobacco Use  . Smoking status: Former Smoker    Packs/day: 1.00    Years: 20.00    Pack years: 20.00    Types: Cigarettes    Last attempt to quit: 04/14/1994    Years since quitting: 24.0  . Smokeless tobacco: Never Used  Substance and Sexual Activity  . Alcohol use: No    Alcohol/week: 0.0 standard drinks  . Drug use: No  . Sexual activity: Not Currently  Lifestyle  . Physical activity:    Days per week: Not on file    Minutes per session: Not on file  . Stress: Not on file  Relationships  . Social connections:    Talks on phone: Not on file    Gets together: Not on file    Attends religious service: Not on file    Active member of club or organization: Not on file  Attends meetings of clubs or organizations: Not on file    Relationship status: Not on file  . Intimate partner violence:    Fear of current or ex partner: Not on file    Emotionally abused: Not on file    Physically abused: Not on file    Forced sexual activity: Not on file  Other Topics Concern  . Not on file  Social History Narrative   Single.   2 children, 1 grandchild.   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   Enjoys making jewelry, puzzle books, sewing, traveling.    Past Surgical History:  Procedure Laterality Date  . ABDOMINAL HYSTERECTOMY  2005   total/ Dr Davis Gourd  . BLADDER TUMOR EXCISION  2006  . BREAST BIOPSY Left 03/13/2016   INVASIVE MAMMARY CARCINOMA  . BREAST LUMPECTOMY Left 2018  . BREAST LUMPECTOMY WITH SENTINEL LYMPH NODE BIOPSY Left  07/14/2016   Procedure: BREAST LUMPECTOMY WITH SENTINEL LYMPH NODE BX;  Surgeon: [name], MD;  Location: [facility] ORS;  Service: General;  Laterality: Left;  . denture surgery    . OOPHORECTOMY    . PORTACATH PLACEMENT N/A 04/01/2016   Procedure: INSERTION PORT-A-CATH;  Surgeon: [name], MD;  Location: [facility] ORS;  Service: General;  Laterality: N/A;  . SKIN CANCER EXCISION Right 2015   foot/ Dr [place] Crane  . TUBAL LIGATION    . TUMOR EXCISION Right    FOOT    Family History  Problem Relation Age of Onset  . Emphysema Father   . Heart disease Mother   . Transient ischemic attack Mother   . Breast cancer Cousin        paternal  . Kidney cancer Neg Hx   . Bladder Cancer Neg Hx     Allergies  Allergen Reactions  . Bee Pollen Hives  . Mosquito (Diagnostic) Hives  . Other Other (See Comments)    Uncoded Allergy. Allergen: VICRYL  (90% glycolide and 10% L-lactide [polyglactin-- synthetic absorbable sterile surgical suture]), Other Reaction: Infection  . Gabapentin Swelling  . Norvasc [Amlodipine Besylate] Swelling    TO FEET  . Lisinopril Cough    Current Outpatient Medications on File Prior to Visit  Medication Sig Dispense Refill  . acetaminophen (TYLENOL) 500 MG tablet Take 1,000 mg by mouth every 6 (six) hours as needed for moderate pain.     Marland Kitchen alendronate (FOSAMAX) 70 MG tablet Take 1 tablet (70 mg total) by mouth once a week. Take with a full glass of water on an empty stomach. 4 tablet 6  . Ascorbic Acid (VITAMIN C) 1000 MG tablet Take 1,000 mg by mouth daily.    Marland Kitchen atorvastatin (LIPITOR) 10 MG tablet Take 1 tablet (10 mg total) by mouth daily. For cholesterol. 90 tablet 3  . Calcium Carb-Cholecalciferol (CALCIUM/VITAMIN D PO) Take 1,200 mg by mouth.    . conjugated estrogens (PREMARIN) vaginal cream Apply twice weekly. 42.5 g 0  . CRANBERRY PO Take 1 capsule by mouth 2 (two) times daily.    Marland Kitchen estradiol (ESTRACE) 0.1 MG/GM vaginal cream Place 1 g vaginally 2 (two)  times a week. For UTI prevention. 42.5 g 0  . hydrochlorothiazide (HYDRODIURIL) 25 MG tablet TAKE 1 TABLET BY MOUTH ONCE DAILY 90 tablet 1  . letrozole (FEMARA) 2.5 MG tablet TAKE 1 TABLET BY MOUTH ONCE A DAY 90 tablet 3  . losartan (COZAAR) 50 MG tablet TAKE 1 TABLET BY MOUTH ONCE DAILY 90 tablet 1  . Probiotic Product (PROBIOTIC  PO) Take 1 tablet by mouth daily.     No current facility-administered medications on file prior to visit.     BP 136/82   Pulse (!) 113   Temp 98 F (36.7 C) (Oral)   Ht 5' 4.5" (1.638 m)   Wt 159 lb 8 oz (72.3 kg)   SpO2 98%   BMI 26.96 kg/m    Objective:   Physical Exam  Constitutional: She appears well-nourished.  Neck: Neck supple.  Cardiovascular: Normal rate and regular rhythm.  Respiratory: Effort normal and breath sounds normal.    3 cm X 1 cm superficially open wound with pink granulation noted. No surrounding erythema, drainage, warmth. Soft.   GI: Soft. Bowel sounds are normal. There is no abdominal tenderness. There is no CVA tenderness.  Skin: Skin is warm and dry.           Assessment & Plan:

## 2018-05-05 NOTE — Assessment & Plan Note (Signed)
No UTI since late October 2019. UA today with 3+ leuks, positive nitrites. Has been on AZO several weeks ago. Given recurrent UTI with antibiotic use, will await culture. She appears well and is not sickly. Discussed to continue Premarin cream twice weekly as this does seem to be helping with prevention. Await culture.

## 2018-05-06 ENCOUNTER — Other Ambulatory Visit: Payer: Self-pay

## 2018-05-06 ENCOUNTER — Encounter: Payer: Self-pay | Admitting: Surgery

## 2018-05-06 ENCOUNTER — Ambulatory Visit: Payer: PPO | Admitting: Surgery

## 2018-05-06 VITALS — BP 146/82 | HR 112 | Temp 97.9°F | Ht 65.0 in | Wt 159.0 lb

## 2018-05-06 DIAGNOSIS — S21101D Unspecified open wound of right front wall of thorax without penetration into thoracic cavity, subsequent encounter: Secondary | ICD-10-CM

## 2018-05-06 LAB — URINE CULTURE
MICRO NUMBER:: 89258
SPECIMEN QUALITY:: ADEQUATE

## 2018-05-06 NOTE — Patient Instructions (Addendum)
Patient may shower and use a dry gauzes  on the area. Return in two months, Dr. Rosana Hoes or Dr. Bary Castilla.  The patient is aware to call back for any questions or concerns.

## 2018-05-06 NOTE — Progress Notes (Signed)
Surgical Clinic Progress/Follow-up Note   HPI:  78 y.o. Female presents to clinic for follow-up evaluation of non-healing Right breast wound several months s/p removal of Right chest wall subcutaneous port and subsequent biopsy. Patient reports she presents due to concerns for infection and pain. She says she has been applying daily occlusive adhesive bandages (band-aid's) and previously had been applying Neosporin ointment to her wound. She denies any fever/chills, wound drainage, N/V, unintentional weight loss, CP, or SOB.  Review of Systems:  Constitutional: denies any other weight loss, fever, chills, or sweats  Eyes: denies any other vision changes, history of eye injury  ENT: denies sore throat, hearing problems  Respiratory: denies shortness of breath, wheezing  Cardiovascular: denies chest pain, palpitations  Gastrointestinal: denies abdominal pain, N/V, or diarrhea Musculoskeletal: denies any other joint pains or cramps  Skin: Denies any other rashes or skin discolorations  Neurological: denies any other headache, dizziness, weakness  Psychiatric: denies any other depression, anxiety  All other review of systems: otherwise negative   Vital Signs:  BP (!) 146/82   Pulse (!) 112   Temp 97.9 F (36.6 C) (Skin)   Ht 5\' 5"  (1.651 m)   Wt 159 lb (72.1 kg)   SpO2 93%   BMI 26.46 kg/m    Physical Exam:  Constitutional:  -- Normal body habitus  -- Awake, alert, and oriented x3  Eyes:  -- Pupils equally round and reactive to light  -- No scleral icterus  Ear, nose, throat:  -- No jugular venous distension  -- No nasal drainage, bleeding Pulmonary:  -- No crackles -- Equal breath sounds bilaterally -- Breathing non-labored at rest Cardiovascular:  -- S1, S2 present  -- No pericardial rubs  Gastrointestinal:  -- Soft, nontender, non-distended, no guarding/rebound  -- No abdominal masses appreciated, pulsatile or otherwise  Musculoskeletal / Integumentary:  -- Wounds  or skin discoloration: 2 cm x 1 cm moderately tender to palpation Right upper chest wall wound with pink base of granulation tissue and no surrounding erythema, fluctuance, or drainage (purulent or otherwise) -- Extremities: B/L UE and LE FROM, hands and feet warm, no edema  Neurologic:  -- Motor function: intact and symmetric  -- Sensation: intact and symmetric   Laboratory studies:  CBC Latest Ref Rng & Units 02/03/2018 12/22/2016 09/29/2016  WBC 4.0 - 10.5 K/uL 7.7 5.0 5.2  Hemoglobin 12.0 - 15.0 g/dL 11.0(L) 12.4 11.4(L)  Hematocrit 36.0 - 46.0 % 33.1(L) 35.2 32.8(L)  Platelets 150.0 - 400.0 K/uL 305.0 231 225   CMP Latest Ref Rng & Units 04/13/2018 02/03/2018 12/22/2016  Glucose 70 - 99 mg/dL 119(H) 128(H) 135(H)  BUN 6 - 23 mg/dL 22 24(H) 21(H)  Creatinine 0.40 - 1.20 mg/dL 1.14 1.19 1.06(H)  Sodium 135 - 145 mEq/L 137 138 137  Potassium 3.5 - 5.1 mEq/L 4.0 4.3 4.4  Chloride 96 - 112 mEq/L 102 100 101  CO2 19 - 32 mEq/L 26 28 28   Calcium 8.4 - 10.5 mg/dL 9.3 9.9 9.8  Total Protein 6.0 - 8.3 g/dL - 7.7 7.5  Total Bilirubin 0.2 - 1.2 mg/dL - 0.6 0.7  Alkaline Phos 39 - 117 U/L - 63 57  AST 0 - 37 U/L - 13 29  ALT 0 - 35 U/L - 20 37   Imaging: No new pertinent imaging studies available for review at this time Pathology: No new pertinent pathology results available for review at this time   Assessment:  78 y.o. yo Female with a problem  list including...  Patient Active Problem List   Diagnosis Date Noted  . Decreased renal function 02/10/2018  . Osteoporosis 02/10/2018  . Dysuria 10/19/2017  . Chest wall abscess 05/15/2017  . Preventative health care 02/09/2017  . Hyperlipidemia 02/09/2017  . Malignant neoplasm of upper-outer quadrant of left breast in female, estrogen receptor positive ([place]) 07/21/2016  . Malignant neoplasm of upper-outer quadrant of left female breast ([place]) 03/19/2016  . Medicare annual wellness visit, subsequent 11/05/2015  . Essential hypertension  05/10/2015  . Frequent UTI 05/10/2015  . Urinary incontinence 05/10/2015    presents to clinic for follow-up evaluation of non-healing Right breast wound several months s/p removal of Right chest wall subcutaneous port and subsequent biopsy.  Plan:   - apply moist to dry gauze dressing once daily  - allow dressing to dry and do not moisten before removing  - prior pathology results reviewed with patient and patient reassured currently no signs of abscess or infection otherwise  - topical lidocaine cream (patient says she has at home) or Tylenol prn for pain  - return to clinic in 2 months, at which time may consider wound excision or debridement  - instructed to call office if any questions or concerns  All of the above recommendations were discussed with the patient, and all of patient's questions were answered to her expressed satisfaction.  -- Marilynne Drivers Rosana Hoes, MD, [place]: [facility] - Partnering for exceptional care. Office: [phone]

## 2018-06-14 ENCOUNTER — Other Ambulatory Visit: Payer: Self-pay

## 2018-06-14 DIAGNOSIS — Z170 Estrogen receptor positive status [ER+]: Principal | ICD-10-CM

## 2018-06-14 DIAGNOSIS — C50412 Malignant neoplasm of upper-outer quadrant of left female breast: Secondary | ICD-10-CM

## 2018-06-21 ENCOUNTER — Telehealth: Payer: Self-pay | Admitting: *Deleted

## 2018-06-21 NOTE — Telephone Encounter (Signed)
Patient called and stated that she is due for a mammogram but her right breast is still so sore that she doesnt think she will be able to have a mammogram. Please call and advise

## 2018-06-23 ENCOUNTER — Other Ambulatory Visit: Payer: Self-pay | Admitting: Primary Care

## 2018-06-23 DIAGNOSIS — I10 Essential (primary) hypertension: Secondary | ICD-10-CM

## 2018-06-29 ENCOUNTER — Other Ambulatory Visit: Payer: Self-pay

## 2018-06-29 ENCOUNTER — Encounter: Payer: Self-pay | Admitting: General Surgery

## 2018-06-29 ENCOUNTER — Ambulatory Visit (INDEPENDENT_AMBULATORY_CARE_PROVIDER_SITE_OTHER): Payer: PPO | Admitting: General Surgery

## 2018-06-29 VITALS — BP 150/80 | HR 74 | Temp 97.7°F | Ht 68.0 in | Wt 161.0 lb

## 2018-06-29 DIAGNOSIS — S21101D Unspecified open wound of right front wall of thorax without penetration into thoracic cavity, subsequent encounter: Secondary | ICD-10-CM

## 2018-06-29 NOTE — Progress Notes (Signed)
Patient ID: Catherine Tate, female   DOB: 08-Feb-1941, 78 y.o.   MRN: [id number]  Chief Complaint  Patient presents with  . Other    HPI Catherine Tate is a 78 y.o. female here today for her follow up open wound from port site.  HPI  Past Medical History:  Diagnosis Date  . Arthritis   . Cancer ([place]) 03/13/2016   INVASIVE MAMMARY CARCINOMA  . Cataract 02/03/2018  . Chickenpox   . Complication of anesthesia   . Frequent UTI   . GERD (gastroesophageal reflux disease)    RARE  . Hypertension   . Personal history of chemotherapy 2017/2018   F/U left breast cancer  . Personal history of radiation therapy 2018   F/U left breast cancer  . PONV (postoperative nausea and vomiting)   . RSD (reflex sympathetic dystrophy)   . Seasonal allergies   . Skin cancer   . Urine incontinence     Past Surgical History:  Procedure Laterality Date  . ABDOMINAL HYSTERECTOMY  2005   total/ Dr Davis Gourd  . BLADDER TUMOR EXCISION  2006  . BREAST BIOPSY Left 03/13/2016   INVASIVE MAMMARY CARCINOMA  . BREAST LUMPECTOMY Left 2018  . BREAST LUMPECTOMY WITH SENTINEL LYMPH NODE BIOPSY Left 07/14/2016   Procedure: BREAST LUMPECTOMY WITH SENTINEL LYMPH NODE BX;  Surgeon: [name], MD;  Location: [facility] ORS;  Service: General;  Laterality: Left;  . denture surgery    . OOPHORECTOMY    . PORTACATH PLACEMENT N/A 04/01/2016   Procedure: INSERTION PORT-A-CATH;  Surgeon: [name], MD;  Location: [facility] ORS;  Service: General;  Laterality: N/A;  . SKIN CANCER EXCISION Right 2015   foot/ Dr [place] Crane  . TUBAL LIGATION    . TUMOR EXCISION Right    FOOT    Family History  Problem Relation Age of Onset  . Emphysema Father   . Heart disease Mother   . Transient ischemic attack Mother   . Breast cancer Cousin        paternal  . Kidney cancer Neg Hx   . Bladder Cancer Neg Hx     Social History Social History   Tobacco Use  . Smoking status: Former Smoker    Packs/day: 1.00    Years: 20.00     Pack years: 20.00    Types: Cigarettes    Last attempt to quit: 04/14/1994    Years since quitting: 24.2  . Smokeless tobacco: Never Used  Substance Use Topics  . Alcohol use: No    Alcohol/week: 0.0 standard drinks  . Drug use: No    Allergies  Allergen Reactions  . Bee Pollen Hives  . Mosquito (Diagnostic) Hives  . Other Other (See Comments)    Uncoded Allergy. Allergen: VICRYL  (90% glycolide and 10% L-lactide [polyglactin-- synthetic absorbable sterile surgical suture]), Other Reaction: Infection  . Gabapentin Swelling  . Norvasc [Amlodipine Besylate] Swelling    TO FEET  . Lisinopril Cough    Current Outpatient Medications  Medication Sig Dispense Refill  . acetaminophen (TYLENOL) 500 MG tablet Take 1,000 mg by mouth every 6 (six) hours as needed for moderate pain.     Marland Kitchen alendronate (FOSAMAX) 70 MG tablet Take 1 tablet (70 mg total) by mouth once a week. Take with a full glass of water on an empty stomach. 4 tablet 6  . Ascorbic Acid (VITAMIN C) 1000 MG tablet Take 1,000 mg by mouth daily.    Marland Kitchen atorvastatin (LIPITOR) 10 MG  tablet Take 1 tablet (10 mg total) by mouth daily. For cholesterol. 90 tablet 3  . Calcium Carb-Cholecalciferol (CALCIUM/VITAMIN D PO) Take 1,200 mg by mouth.    . conjugated estrogens (PREMARIN) vaginal cream Apply twice weekly. 42.5 g 0  . CRANBERRY PO Take 1 capsule by mouth 2 (two) times daily.    Marland Kitchen estradiol (ESTRACE) 0.1 MG/GM vaginal cream Place 1 g vaginally 2 (two) times a week. For UTI prevention. 42.5 g 0  . hydrochlorothiazide (HYDRODIURIL) 25 MG tablet TAKE 1 TABLET BY MOUTH ONCE DAILY 90 tablet 1  . letrozole (FEMARA) 2.5 MG tablet TAKE 1 TABLET BY MOUTH ONCE A DAY 90 tablet 3  . losartan (COZAAR) 50 MG tablet TAKE 1 TABLET BY MOUTH ONCE DAILY 90 tablet 1  . Probiotic Product (PROBIOTIC PO) Take 1 tablet by mouth daily.     No current facility-administered medications for this visit.     Review of Systems Review of Systems  Blood  pressure (!) 150/80, pulse 74, temperature 97.7 F (36.5 C), temperature source Skin, height 5\' 8"  (1.727 m), weight 161 lb (73 kg), SpO2 97 %.  Physical Exam Physical Exam Exam conducted with a chaperone present.  Chest:        Assessment Slow resolution of chronic inflammation of PowerPort site.  Plan  Return after mammogram in July 2020. The patient is aware to call back for any questions or concerns.   HPI, Physical Exam, Assessment and Plan have been scribed under the direction and in the presence of Hervey Ard, MD.  [name], CMA  I have completed the exam and reviewed the above documentation for accuracy and completeness.  I agree with the above.  Haematologist has been used and any errors in dictation or transcription are unintentional.  Hervey Ard, M.D., F.A.C.S.  Catherine Tate 06/30/2018, 2:55 PM

## 2018-06-29 NOTE — Patient Instructions (Signed)
Return after mammogram in July 2020. The patient is aware to call back for any questions or concerns.

## 2018-07-09 ENCOUNTER — Other Ambulatory Visit: Payer: Self-pay

## 2018-07-09 ENCOUNTER — Other Ambulatory Visit: Payer: Self-pay | Admitting: Oncology

## 2018-07-09 MED ORDER — LETROZOLE 2.5 MG PO TABS: 2.5000 mg | ORAL_TABLET | Freq: Every day | ORAL | 3 refills | Status: DC

## 2018-07-12 ENCOUNTER — Encounter: Payer: Self-pay | Admitting: Oncology

## 2018-07-12 ENCOUNTER — Other Ambulatory Visit: Payer: Self-pay

## 2018-07-12 ENCOUNTER — Inpatient Hospital Stay: Payer: PPO | Attending: Oncology | Admitting: Oncology

## 2018-07-12 DIAGNOSIS — C50412 Malignant neoplasm of upper-outer quadrant of left female breast: Secondary | ICD-10-CM

## 2018-07-12 DIAGNOSIS — Z170 Estrogen receptor positive status [ER+]: Secondary | ICD-10-CM | POA: Diagnosis not present

## 2018-07-12 MED ORDER — ALENDRONATE SODIUM 70 MG PO TABS: 70.0000 mg | ORAL_TABLET | ORAL | 6 refills | Status: DC

## 2018-07-12 NOTE — Progress Notes (Signed)
[place]  Telephone:([phone] [phone]) [phone]  ID: BRIEANNE MIGNONE OB: [birth year]  MR#: [id number]  LSL#:[id number]  Patient Care Team: [name], NP as PCP - General (Internal Medicine) Bary Castilla, [name], MD (General Surgery) [name], MD as Consulting Physician [facility])  Virtual Visit via Telephone Note  I connected with Catherine Tate on 07/12/18 at 10:45 AM EDT by telephone and verified that I am speaking with the correct person using two identifiers.   I discussed the limitations, risks, security and privacy concerns of performing an evaluation and management service by telephone and the availability of in person appointments. I also discussed with the patient that there may be a patient responsible charge related to this service. The patient expressed understanding and agreed to proceed.   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  INTERVAL HISTORY: Patient agreed to be evaluated over telephone routine 23-month  She currently feels well and is asymptomatic.  She admits to occasional nausea with Fosamax.  She continues to tolerate letrozole well without significant side effects.  She has no neurologic complaints.  She denies any recent fevers or illnesses.  She denies any chest pain, shortness of breath, cough, or hemoptysis.  She denies any nausea, vomiting, constipation, or diarrhea. She has no urinary complaints.  Patient feels at her baseline offers no specific complaints today.  REVIEW OF SYSTEMS:   Review of Systems  Constitutional: Negative.  Negative for fever, malaise/fatigue and weight loss.  HENT: Negative for sinus pain.   Respiratory: Negative.  Negative for cough and shortness of breath.   Cardiovascular: Negative.  Negative for chest pain and leg swelling.  Gastrointestinal: Negative.  Negative for abdominal pain, constipation  and nausea.  Genitourinary: Negative for hematuria and urgency.  Musculoskeletal: Negative.   Skin: Negative.  Negative for rash.  Neurological: Negative.  Negative for sensory change, weakness and headaches.  Psychiatric/Behavioral: Negative.  The patient is not nervous/anxious and does not have insomnia.      As per HPI. Otherwise, a complete review of systems is negative.  PAST MEDICAL HISTORY: Past Medical History:  Diagnosis Date  . Arthritis   . Cancer ([place] 03/13/2016   INVASIVE MAMMARY CARCINOMA  . Cataract 02/03/2018  . Chickenpox   . Complication of anesthesia   . Frequent UTI   . GERD (gastroesophageal reflux disease)    RARE  . Hypertension   . Personal history of chemotherapy 2017/2018   F/U left breast cancer  . Personal history of radiation therapy 2018   F/U left breast cancer  . PONV (postoperative nausea and vomiting)   . RSD (reflex sympathetic dystrophy)   . Seasonal allergies   . Skin cancer   . Urine incontinence     PAST SURGICAL HISTORY: Past Surgical History:  Procedure Laterality Date  . ABDOMINAL HYSTERECTOMY  2005   total/ Dr WDavis Gourd . BLADDER TUMOR EXCISION  2006  . BREAST BIOPSY Left 03/13/2016   INVASIVE MAMMARY CARCINOMA  . BREAST LUMPECTOMY Left 2018  . BREAST LUMPECTOMY WITH SENTINEL LYMPH NODE BIOPSY Left 07/14/2016   Procedure: BREAST LUMPECTOMY WITH SENTINEL LYMPH NODE BX;  Surgeon: JRobert Bellow MD;  Location: [facility] ORS;  Service: General;  Laterality: Left;  . denture surgery    . OOPHORECTOMY    . PORTACATH PLACEMENT N/A 04/01/2016   Procedure: INSERTION PORT-A-CATH;  Surgeon: JRobert Bellow MD;  Location: [facility]  ORS;  Service: General;  Laterality: N/A;  . SKIN CANCER EXCISION Right 2015   foot/ Dr [place] Crane  . TUBAL LIGATION    . TUMOR EXCISION Right    FOOT    FAMILY HISTORY: Family History  Problem Relation Age of Onset  . Emphysema Father   . Heart disease Mother   . Transient ischemic attack Mother   .  Breast cancer Cousin        paternal  . Kidney cancer Neg Hx   . Bladder Cancer Neg Hx     ADVANCED DIRECTIVES (Y/N):  N  HEALTH MAINTENANCE: Social History   Tobacco Use  . Smoking status: Former Smoker    Packs/day: 1.00    Years: 20.00    Pack years: 20.00    Types: Cigarettes    Last attempt to quit: 04/14/1994    Years since quitting: 24.2  . Smokeless tobacco: Never Used  Substance Use Topics  . Alcohol use: No    Alcohol/week: 0.0 standard drinks  . Drug use: No     Colonoscopy:  PAP:  Bone density:  Lipid panel:  Allergies  Allergen Reactions  . Bee Pollen Hives  . Mosquito (Diagnostic) Hives  . Other Other (See Comments)    Uncoded Allergy. Allergen: VICRYL  (90% glycolide and 10% L-lactide [polyglactin-- synthetic absorbable sterile surgical suture]), Other Reaction: Infection  . Gabapentin Swelling  . Norvasc [Amlodipine Besylate] Swelling    TO FEET  . Lisinopril Cough    Current Outpatient Medications  Medication Sig Dispense Refill  . acetaminophen (TYLENOL) 500 MG tablet Take 1,000 mg by mouth every 6 (six) hours as needed for moderate pain.     Marland Kitchen alendronate (FOSAMAX) 70 MG tablet Take 1 tablet (70 mg total) by mouth once a week. Take with a full glass of water on an empty stomach. 12 tablet 6  . Ascorbic Acid (VITAMIN C) 1000 MG tablet Take 1,000 mg by mouth daily.    Marland Kitchen atorvastatin (LIPITOR) 10 MG tablet Take 1 tablet (10 mg total) by mouth daily. For cholesterol. 90 tablet 3  . Calcium Carb-Cholecalciferol (CALCIUM/VITAMIN D PO) Take 1,200 mg by mouth.    . CRANBERRY PO Take 1 capsule by mouth 2 (two) times daily.    Marland Kitchen estradiol (ESTRACE) 0.1 MG/GM vaginal cream Place 1 g vaginally 2 (two) times a week. For UTI prevention. 42.5 g 0  . hydrochlorothiazide (HYDRODIURIL) 25 MG tablet TAKE 1 TABLET BY MOUTH ONCE DAILY 90 tablet 1  . letrozole (FEMARA) 2.5 MG tablet Take 1 tablet (2.5 mg total) by mouth daily. 90 tablet 3  . losartan (COZAAR) 50 MG  tablet TAKE 1 TABLET BY MOUTH ONCE DAILY 90 tablet 1  . Probiotic Product (PROBIOTIC PO) Take 1 tablet by mouth daily.     No current facility-administered medications for this visit.     OBJECTIVE: There were no vitals filed for this visit.   There is no height or weight on file to calculate BMI.    ECOG FS:0 - Asymptomatic  LAB RESULTS:  Lab Results  Component Value Date   NA 137 04/13/2018   K 4.0 04/13/2018   CL 102 04/13/2018   CO2 26 04/13/2018   GLUCOSE 119 (H) 04/13/2018   BUN 22 04/13/2018   CREATININE 1.14 04/13/2018   CALCIUM 9.3 04/13/2018   PROT 7.7 02/03/2018   ALBUMIN 4.2 02/03/2018   AST 13 02/03/2018   ALT 20 02/03/2018   ALKPHOS 63 02/03/2018   BILITOT  0.6 02/03/2018   GFRNONAA 50 (L) 12/22/2016   GFRAA 58 (L) 12/22/2016    Lab Results  Component Value Date   WBC 7.7 02/03/2018   NEUTROABS 5.3 02/03/2018   HGB 11.0 (L) 02/03/2018   HCT 33.1 (L) 02/03/2018   MCV 89.3 02/03/2018   PLT 305.0 02/03/2018     STUDIES: No results found.  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  PLAN:    1. 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 She then underwent a lumpectomy on July 14, 2016 and completed adjuvant XRT.  She continues to tolerate letrozole and will require treatment for a total of 5 years completing in June 2023. Patient's most recent mammogram on July 14, 2017 was reported as BI-RADS 2.  Given the recent COVID-19 pandemic, patient's repeat mammogram has been put off several months.  Return to clinic in 6 months for routine evaluation. 2.  Decreased EF%: Patient had a pretreatment EF of 41%. Continue monitoring evaluation per cardiology. 3.  Osteoporosis: Bone mineral density on November 16, 2017 reported a T score of  -2.5 which is considered osteoporosis.  This has decreased from 1 year prior when the T score was reported -2.3.  Patient has been instructed to continue Fosamax, calcium, and vitamin D supplementation.  Repeat bone mineral density in August 2020. 4.  Nausea: Possibly secondary to Fosamax.  Patient states this is mild and does not wish to discontinue treatment at this time.  Patient expressed understanding and was in agreement with this plan. She also understands that She can call clinic at any time with any questions, concerns, or complaints.   I discussed the assessment and treatment plan with the patient. The patient was provided an opportunity to ask questions and all were answered. The patient agreed with the plan and demonstrated an understanding of the instructions.   The patient was advised to call back or seek an in-person evaluation if the symptoms worsen or if the condition fails to improve as anticipated.  I provided 15 minutes of non-face-to-face time during this encounter.   Cancer Staging Malignant neoplasm of upper-outer quadrant of left female breast [facility]) Staging form: Breast, AJCC 7th Edition - Clinical stage from 03/19/2016: Stage IIA (T2, N0, M0) - Signed by Lloyd Huger, MD on 04/06/2016 - Pathologic stage from 07/27/2016: Stage IIA (T2, N0, cM0) - Signed by Lloyd Huger, MD on 07/27/2016    Lloyd Huger, MD 07/12/18 1:48 PM

## 2018-07-12 NOTE — Progress Notes (Signed)
Patient denies any concerns today.  

## 2018-07-16 ENCOUNTER — Other Ambulatory Visit: Payer: PPO

## 2018-07-16 ENCOUNTER — Telehealth: Payer: Self-pay | Admitting: *Deleted

## 2018-07-16 NOTE — Telephone Encounter (Signed)
Patient called stating that she received a notice from [employer] for DOS 10-14-17 stating that her claim that was for this date of service was passed the filing limit and they were not going to pay.   Note routed to Angie to call patient regarding the above.

## 2018-08-02 NOTE — Telephone Encounter (Signed)
Please contact patient and let her know that I have reviewed her account. Catherine Tate submitted the claim for date of service 10/14/17 on 10/19/17 and the claim was paid on 11/24/17. There is no patient balance at this time. If the patient has any further questions please have her call the billing department at

## 2018-10-06 ENCOUNTER — Other Ambulatory Visit: Payer: Self-pay | Admitting: Primary Care

## 2018-10-06 DIAGNOSIS — I10 Essential (primary) hypertension: Secondary | ICD-10-CM

## 2018-10-29 ENCOUNTER — Encounter: Payer: Self-pay | Admitting: *Deleted

## 2018-11-03 ENCOUNTER — Ambulatory Visit
Admission: RE | Admit: 2018-11-03 | Discharge: 2018-11-03 | Disposition: A | Discharge status: Home / Self Care | Payer: PPO | Source: Ambulatory Visit | Attending: General Surgery | Admitting: General Surgery

## 2018-11-03 DIAGNOSIS — C50412 Malignant neoplasm of upper-outer quadrant of left female breast: Secondary | ICD-10-CM | POA: Diagnosis not present

## 2018-11-03 DIAGNOSIS — Z170 Estrogen receptor positive status [ER+]: Secondary | ICD-10-CM | POA: Diagnosis not present

## 2018-11-03 DIAGNOSIS — R928 Other abnormal and inconclusive findings on diagnostic imaging of breast: Secondary | ICD-10-CM | POA: Diagnosis not present

## 2018-11-11 ENCOUNTER — Ambulatory Visit: Payer: PPO | Admitting: General Surgery

## 2018-11-15 ENCOUNTER — Encounter: Payer: Self-pay | Admitting: Surgery

## 2018-11-15 ENCOUNTER — Other Ambulatory Visit: Payer: Self-pay

## 2018-11-15 ENCOUNTER — Ambulatory Visit: Payer: PPO | Admitting: Surgery

## 2018-11-15 VITALS — BP 138/88 | HR 105 | Temp 97.5°F | Resp 16 | Ht 67.0 in | Wt 163.2 lb

## 2018-11-15 DIAGNOSIS — Z170 Estrogen receptor positive status [ER+]: Secondary | ICD-10-CM | POA: Diagnosis not present

## 2018-11-15 DIAGNOSIS — C50412 Malignant neoplasm of upper-outer quadrant of left female breast: Secondary | ICD-10-CM

## 2018-11-15 NOTE — Patient Instructions (Addendum)
Patient will be asked to return to the office in one year with a bilateral diagnotic mammogram. The patient is aware to call back for any questions or concerns. 

## 2018-11-17 ENCOUNTER — Encounter: Payer: Self-pay | Admitting: Surgery

## 2018-11-17 NOTE — Progress Notes (Signed)
Outpatient Surgical Follow Up  11/17/2018  Catherine Tate is an 78 y.o. female.   Chief Complaint  Patient presents with  . Follow-up    F/U mammogram diagnostic    HPI: 78 year old female with a history of left breast cancer received neoadjuvant chemotherapy and lumpectomy with sentinel lymph node biopsy by Dr. Bary Castilla on April 2018 Stage IIa Er/PR + her 2 Neg.  She is here for routine follow-up with a physical exam and mammogram.  She has been doing well and denies any concerns.  No fevers no chills and no new pathologic lesion within the breast.  I have personally reviewed the mammogram showing no evidence of suspicious lesions. She is being followed by Dr. Grayland Ormond.  Past Medical History:  Diagnosis Date  . Arthritis   . Cancer ([place]) 03/13/2016   INVASIVE MAMMARY CARCINOMA  . Cataract 02/03/2018  . Chickenpox   . Complication of anesthesia   . Frequent UTI   . GERD (gastroesophageal reflux disease)    RARE  . Hypertension   . Personal history of chemotherapy 2017/2018   F/U left breast cancer  . Personal history of radiation therapy 2018   F/U left breast cancer  . PONV (postoperative nausea and vomiting)   . RSD (reflex sympathetic dystrophy)   . Seasonal allergies   . Skin cancer   . Urine incontinence     Past Surgical History:  Procedure Laterality Date  . ABDOMINAL HYSTERECTOMY  2005   total/ Dr Davis Gourd  . BLADDER TUMOR EXCISION  2006  . BREAST BIOPSY Left 03/13/2016   INVASIVE MAMMARY CARCINOMA  . BREAST LUMPECTOMY Left 2018  . BREAST LUMPECTOMY WITH SENTINEL LYMPH NODE BIOPSY Left 07/14/2016   Procedure: BREAST LUMPECTOMY WITH SENTINEL LYMPH NODE BX;  Surgeon: [name], MD;  Location: [facility] ORS;  Service: General;  Laterality: Left;  . denture surgery    . OOPHORECTOMY    . PORTACATH PLACEMENT N/A 04/01/2016   Procedure: INSERTION PORT-A-CATH;  Surgeon: [name], MD;  Location: [facility] ORS;  Service: General;  Laterality: N/A;  . SKIN CANCER  EXCISION Right 2015   foot/ Dr [place] Crane  . TUBAL LIGATION    . TUMOR EXCISION Right    FOOT    Family History  Problem Relation Age of Onset  . Emphysema Father   . Heart disease Mother   . Transient ischemic attack Mother   . Breast cancer Cousin        paternal  . Kidney cancer Neg Hx   . Bladder Cancer Neg Hx     Social History:  reports that she quit smoking about 24 years ago. Her smoking use included cigarettes. She has a 20.00 pack-year smoking history. She has never used smokeless tobacco. She reports that she does not drink alcohol or use drugs.  Allergies:  Allergies  Allergen Reactions  . Bee Pollen Hives  . Mosquito (Diagnostic) Hives  . Other Other (See Comments)    Uncoded Allergy. Allergen: VICRYL  (90% glycolide and 10% L-lactide [polyglactin-- synthetic absorbable sterile surgical suture]), Other Reaction: Infection  . Gabapentin Swelling  . Norvasc [Amlodipine Besylate] Swelling    TO FEET  . Lisinopril Cough    Medications reviewed.    ROS Full ROS performed and is otherwise negative other than what is stated in HPI   BP 138/88   Pulse (!) 105   Temp (!) 97.5 F (36.4 C) (Temporal)   Resp 16   Ht 5\' 7"  (1.702 m)  Wt 163 lb 3.2 oz (74 kg)   SpO2 96%   BMI 25.56 kg/m   Physical Exam Vitals signs and nursing note reviewed. Exam conducted with a chaperone present.  Constitutional:      General: She is not in acute distress.    Appearance: Normal appearance.  Neck:     Musculoskeletal: Normal range of motion and neck supple. No neck rigidity or muscular tenderness.  Pulmonary:     Effort: Pulmonary effort is normal. No respiratory distress.     Comments: Breast: There is evidence of previous lumpectomy and sentinel lymph node biopsy site.  No evidence of infection no evidence of new masses no evidence of lymphadenopathy.  The skin and the nipple are intact Abdominal:     General: Abdomen is flat. There is no distension.     Palpations: There  is no mass.  Neurological:     General: No focal deficit present.     Mental Status: She is alert and oriented to person, place, and time.  Psychiatric:        Mood and Affect: Mood normal.        Behavior: Behavior normal.        Thought Content: Thought content normal.        Judgment: Judgment normal.        Assessment/Plan:     Greater than 50% of the 25 minutes  visit was spent in counseling/coordination of care   Caroleen Hamman, MD [place] [facility]

## 2018-11-18 ENCOUNTER — Ambulatory Visit
Admission: RE | Admit: 2018-11-18 | Discharge: 2018-11-18 | Disposition: A | Discharge status: Home / Self Care | Payer: PPO | Source: Ambulatory Visit | Attending: Oncology | Admitting: Oncology

## 2018-11-18 DIAGNOSIS — M8580 Other specified disorders of bone density and structure, unspecified site: Secondary | ICD-10-CM | POA: Diagnosis not present

## 2018-11-18 DIAGNOSIS — N959 Unspecified menopausal and perimenopausal disorder: Secondary | ICD-10-CM | POA: Insufficient documentation

## 2018-11-18 DIAGNOSIS — Z170 Estrogen receptor positive status [ER+]: Secondary | ICD-10-CM | POA: Diagnosis not present

## 2018-11-18 DIAGNOSIS — C50412 Malignant neoplasm of upper-outer quadrant of left female breast: Secondary | ICD-10-CM | POA: Diagnosis not present

## 2018-11-18 DIAGNOSIS — Z13820 Encounter for screening for osteoporosis: Secondary | ICD-10-CM | POA: Diagnosis not present

## 2018-11-18 DIAGNOSIS — M8589 Other specified disorders of bone density and structure, multiple sites: Secondary | ICD-10-CM | POA: Diagnosis not present

## 2018-12-02 ENCOUNTER — Ambulatory Visit: Payer: PPO | Admitting: Radiation Oncology

## 2018-12-25 ENCOUNTER — Other Ambulatory Visit: Payer: Self-pay | Admitting: Primary Care

## 2018-12-25 DIAGNOSIS — I10 Essential (primary) hypertension: Secondary | ICD-10-CM

## 2019-01-12 ENCOUNTER — Ambulatory Visit: Payer: PPO | Admitting: Radiation Oncology

## 2019-01-12 ENCOUNTER — Ambulatory Visit: Payer: PPO | Admitting: Oncology

## 2019-01-16 NOTE — Progress Notes (Signed)
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  INTERVAL HISTORY: Patient returns to clinic today for routine 59-monthevaluation.  She continues to tolerate letrozole and Fosamax without significant side effects.  She currently feels well and is asymptomatic. She has no neurologic complaints.  She denies any recent fevers or illnesses.  She denies any chest pain, shortness of breath, cough, or hemoptysis.  She denies any nausea, vomiting, constipation, or diarrhea. She has no urinary complaints.  Patient offers no specific complaints today.  REVIEW OF SYSTEMS:   Review of Systems  Constitutional: Negative.  Negative for fever, malaise/fatigue and weight loss.  HENT: Negative for sinus pain.   Respiratory: Negative.  Negative for cough and shortness of breath.   Cardiovascular: Negative.  Negative for chest pain and leg swelling.  Gastrointestinal: Negative.  Negative for abdominal pain, constipation and nausea.  Genitourinary: Negative for hematuria and urgency.  Musculoskeletal: Negative.   Skin: Negative.  Negative for rash.  Neurological: Negative.  Negative for sensory change, weakness and headaches.  Psychiatric/Behavioral: Negative.  The patient is not nervous/anxious and does not have insomnia.      As per HPI. Otherwise, a complete review of systems is negative.  PAST MEDICAL HISTORY: Past Medical History:  Diagnosis Date  . Arthritis   . [place]   INVASIVE MAMMARY CARCINOMA  . Cataract 02/03/2018  . Chickenpox   . Complication of anesthesia   .  Frequent UTI   . GERD (gastroesophageal reflux disease)    RARE  . Hypertension   . Personal history of chemotherapy 2017/2018   F/U left breast cancer  . Personal history of radiation therapy 2018   F/U left breast cancer  . PONV (postoperative nausea and vomiting)   . RSD (reflex sympathetic dystrophy)   . Seasonal allergies   . Skin cancer   . Urine incontinence     PAST SURGICAL HISTORY: Past Surgical History:  Procedure Laterality Date  . ABDOMINAL HYSTERECTOMY  2005   total/ Dr WDavis Gourd . BLADDER TUMOR EXCISION  2006  . BREAST BIOPSY Left 03/13/2016   INVASIVE MAMMARY CARCINOMA  . BREAST LUMPECTOMY Left 2018  . BREAST LUMPECTOMY WITH SENTINEL LYMPH NODE BIOPSY Left 07/14/2016   Procedure: BREAST LUMPECTOMY WITH SENTINEL LYMPH NODE BX;  Surgeon: JRobert Bellow MD;  Location: [facility] ORS;  Service: General;  Laterality: Left;  . denture surgery    . OOPHORECTOMY    . PORTACATH PLACEMENT N/A 04/01/2016   Procedure: INSERTION PORT-A-CATH;  Surgeon: JRobert Bellow MD;  Location: [facility] ORS;  Service: General;  Laterality: N/A;  . SKIN CANCER EXCISION Right 2015   foot/ Dr [place] Crane . TUBAL LIGATION    . TUMOR EXCISION Right    FOOT    FAMILY HISTORY: Family History  Problem Relation Age of Onset  . Emphysema Father   . Heart disease Mother   . Transient ischemic attack Mother   . Breast cancer Cousin        paternal  . Kidney cancer Neg Hx   . Bladder Cancer Neg Hx     ADVANCED DIRECTIVES (Y/N):  N  HEALTH MAINTENANCE: Social History   Tobacco Use  . Smoking status: Former Smoker    Packs/day: 1.00    Years: 20.00    Pack years: 20.00    Types: Cigarettes    Quit date: 04/14/1994    Years since quitting: 24.7  . Smokeless tobacco: Never Used  Substance Use Topics  . Alcohol use: No    Alcohol/week: 0.0 standard drinks  . Drug use: No     Colonoscopy:  PAP:  Bone density:  Lipid panel:  Allergies  Allergen Reactions  . Bee Pollen Hives  .  Mosquito (Diagnostic) Hives  . Other Other (See Comments)    Uncoded Allergy. Allergen: VICRYL  (90% glycolide and 10% L-lactide [polyglactin-- synthetic absorbable sterile surgical suture]), Other Reaction: Infection  . Gabapentin Swelling  . Norvasc [Amlodipine Besylate] Swelling    TO FEET  . Lisinopril Cough    Current Outpatient Medications  Medication Sig Dispense Refill  . acetaminophen (TYLENOL) 500 MG tablet Take 1,000 mg by mouth every 6 (six) hours as needed for moderate pain.     Marland Kitchen alendronate (FOSAMAX) 70 MG tablet Take 1 tablet (70 mg total) by mouth once a week. Take with a full glass of water on an empty stomach. 12 tablet 6  . Ascorbic Acid (VITAMIN C) 1000 MG tablet Take 1,000 mg by mouth daily.    Marland Kitchen atorvastatin (LIPITOR) 10 MG tablet Take 1 tablet (10 mg total) by mouth daily. For cholesterol. 90 tablet 3  . Calcium Carb-Cholecalciferol (CALCIUM/VITAMIN D PO) Take 1,200 mg by mouth.    . CRANBERRY PO Take 1 capsule by mouth 2 (two) times daily.    Marland Kitchen estradiol (ESTRACE) 0.1 MG/GM vaginal cream Place 1 g vaginally 2 (two) times a week. For UTI prevention. 42.5 g 0  . hydrochlorothiazide (HYDRODIURIL) 25 MG tablet TAKE 1 TABLET BY MOUTH ONCE DAILY 90 tablet 1  . letrozole (FEMARA) 2.5 MG tablet Take 1 tablet (2.5 mg total) by mouth daily. 90 tablet 3  . losartan (COZAAR) 50 MG tablet TAKE 1 TABLET BY MOUTH ONCE A DAY 90 tablet 1  . Probiotic Product (PROBIOTIC PO) Take 1 tablet by mouth daily.     No current facility-administered medications for this visit.     OBJECTIVE: Vitals:   01/19/19 0958  BP: 115/68  Pulse: (!) 118  Temp: 98.7 F (37.1 C)     Body mass index is 25.22 kg/m.    ECOG FS:0 - Asymptomatic  General: Well-developed, well-nourished, no acute distress. Eyes: Pink conjunctiva, anicteric sclera. HEENT: Normocephalic, moist mucous membranes. Breast: Patient declined exam today. Lungs: Clear to auscultation bilaterally. Heart: Regular rate and  rhythm. No rubs, murmurs, or gallops. Abdomen: Soft, nontender, nondistended. No organomegaly noted, normoactive bowel sounds. Musculoskeletal: No edema, cyanosis, or clubbing. Neuro: Alert, answering all questions appropriately. Cranial nerves grossly intact. Skin: No rashes or petechiae noted. Psych: Normal affect.  LAB RESULTS:  Lab Results  Component Value Date   NA 137 04/13/2018   K 4.0 04/13/2018   CL 102 04/13/2018   CO2 26 04/13/2018   GLUCOSE 119 (H) 04/13/2018   BUN 22 04/13/2018   CREATININE 1.14 04/13/2018   CALCIUM 9.3 04/13/2018   PROT 7.7 02/03/2018   ALBUMIN 4.2 02/03/2018   AST 13 02/03/2018   ALT 20 02/03/2018   ALKPHOS 63 02/03/2018   BILITOT 0.6 02/03/2018   GFRNONAA 50 (L) 12/22/2016   GFRAA 58 (L) 12/22/2016    Lab Results  Component Value Date  WBC 7.7 02/03/2018   NEUTROABS 5.3 02/03/2018   HGB 11.0 (L) 02/03/2018   HCT 33.1 (L) 02/03/2018   MCV 89.3 02/03/2018   PLT 305.0 02/03/2018     STUDIES: No results found.  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  PLAN:    1. 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 She then underwent a lumpectomy on July 14, 2016 and completed adjuvant XRT.  Continue letrozole for a minimum of 5 years completing treatment in June 2023.  Her most recent mammogram on November 03, 2018 was reported as BI-RADS 2.  Repeat in July 2021.  Return to clinic in 6 months for routine evaluation.  2.  Decreased EF%: Patient had a pretreatment EF of 41%. Continue monitoring evaluation per cardiology. 3.  Osteopenia: Patient's most recent bone mineral density on November 18, 2018 reported T score of -2.2.  This is improved over 1 year prior where the T score was reported at -2.5.  Continue Fosamax,  calcium, and vitamin D supplementation.  Repeat bone mineral density in August 2021.  If there is continued improvement, can consider discontinuing Fosamax.    Patient expressed understanding and was in agreement with this plan. She also understands that She can call clinic at any time with any questions, concerns, or complaints.   I spent a total of 20 minutes face-to-face with the patient of which greater than 50% of the visit was spent in counseling and coordination of care as detailed above.    Cancer Staging Malignant neoplasm of upper-outer quadrant of left female breast [facility]) Staging form: Breast, AJCC 7th Edition - Clinical stage from 03/19/2016: Stage IIA (T2, N0, M0) - Signed by Lloyd Huger, MD on 04/06/2016 - Pathologic stage from 07/27/2016: Stage IIA (T2, N0, cM0) - Signed by Lloyd Huger, MD on 07/27/2016    Lloyd Huger, MD 01/22/19 10:47 AM

## 2019-01-18 ENCOUNTER — Encounter: Payer: Self-pay | Admitting: Oncology

## 2019-01-18 ENCOUNTER — Other Ambulatory Visit: Payer: Self-pay

## 2019-01-18 NOTE — Progress Notes (Signed)
Patient pre screened for office appointment, no questions or concerns today. 

## 2019-01-19 ENCOUNTER — Other Ambulatory Visit: Payer: Self-pay

## 2019-01-19 ENCOUNTER — Inpatient Hospital Stay: Payer: PPO | Attending: Oncology | Admitting: Oncology

## 2019-01-19 ENCOUNTER — Encounter: Payer: Self-pay | Admitting: Radiation Oncology

## 2019-01-19 ENCOUNTER — Ambulatory Visit
Admission: RE | Admit: 2019-01-19 | Discharge: 2019-01-19 | Disposition: A | Discharge status: Home / Self Care | Payer: PPO | Source: Ambulatory Visit | Attending: Radiation Oncology | Admitting: Radiation Oncology

## 2019-01-19 VITALS — BP 115/68 | HR 118 | Temp 98.7°F | Ht 67.0 in | Wt 161.0 lb

## 2019-01-19 VITALS — BP 150/92 | HR 122 | Resp 18 | Wt 160.9 lb

## 2019-01-19 DIAGNOSIS — I10 Essential (primary) hypertension: Secondary | ICD-10-CM | POA: Diagnosis not present

## 2019-01-19 DIAGNOSIS — C50412 Malignant neoplasm of upper-outer quadrant of left female breast: Secondary | ICD-10-CM

## 2019-01-19 DIAGNOSIS — Z923 Personal history of irradiation: Secondary | ICD-10-CM | POA: Diagnosis not present

## 2019-01-19 DIAGNOSIS — Z170 Estrogen receptor positive status [ER+]: Secondary | ICD-10-CM | POA: Insufficient documentation

## 2019-01-19 DIAGNOSIS — Z79899 Other long term (current) drug therapy: Secondary | ICD-10-CM | POA: Insufficient documentation

## 2019-01-19 DIAGNOSIS — Z90710 Acquired absence of both cervix and uterus: Secondary | ICD-10-CM | POA: Diagnosis not present

## 2019-01-19 DIAGNOSIS — Z87891 Personal history of nicotine dependence: Secondary | ICD-10-CM | POA: Diagnosis not present

## 2019-01-19 DIAGNOSIS — M8580 Other specified disorders of bone density and structure, unspecified site: Secondary | ICD-10-CM | POA: Diagnosis not present

## 2019-01-19 DIAGNOSIS — Z79811 Long term (current) use of aromatase inhibitors: Secondary | ICD-10-CM | POA: Insufficient documentation

## 2019-01-19 DIAGNOSIS — Z85828 Personal history of other malignant neoplasm of skin: Secondary | ICD-10-CM | POA: Diagnosis not present

## 2019-01-19 DIAGNOSIS — Z9221 Personal history of antineoplastic chemotherapy: Secondary | ICD-10-CM | POA: Diagnosis not present

## 2019-01-19 DIAGNOSIS — Z803 Family history of malignant neoplasm of breast: Secondary | ICD-10-CM | POA: Insufficient documentation

## 2019-01-19 NOTE — Progress Notes (Signed)
Radiation Oncology Follow up Note  Name: PERCY LUDWICK   Date:   01/19/2019 MRN:  [id number] DOB: 1941-02-21    This 78 y.o. female presents to the clinic today for 2-year follow-up status post whole breast radiation to her left breast for stage IIa invasive mammary carcinoma ER PR positive.Marland Kitchen  REFERRING PROVIDER: [name], NP  HPI: Patient is a 78 year old female now 2 years having completed whole breast radiation to her left breast for stage IIa ER PR positive invasive mammary carcinoma.  Seen today in routine follow-up she is doing well.  She specifically denies breast tenderness cough or bone pain.  She has a continued ulceration present in the area of port placed on the right chest wall outside of her previous radiation field..  She is currently on letrozole tolerating that well without side effect.  Her last mammogram which I have reviewed was in July and was BI-RADS 2 benign.  COMPLICATIONS OF TREATMENT: none  FOLLOW UP COMPLIANCE: keeps appointments   PHYSICAL EXAM:  BP (!) 150/92 (BP Location: Right Arm, Patient Position: Sitting)   Pulse (!) 122   Resp 18   Wt 160 lb 14.4 oz (73 kg)   BMI 25.20 kg/m  Lungs are clear to A&P cardiac examination essentially unremarkable with regular rate and rhythm. No dominant mass or nodularity is noted in either breast in 2 positions examined. Incision is well-healed. No axillary or supraclavicular adenopathy is appreciated. Cosmetic result is excellent.  Area of ulceration is healing in with granulation tissue over the right Port-A-Cath.  Well-developed well-nourished patient in NAD. HEENT reveals PERLA, EOMI, discs not visualized.  Oral cavity is clear. No oral mucosal lesions are identified. Neck is clear without evidence of cervical or supraclavicular adenopathy. Lungs are clear to A&P. Cardiac examination is essentially unremarkable with regular rate and rhythm without murmur rub or thrill. Abdomen is benign with no organomegaly or  masses noted. Motor sensory and DTR levels are equal and symmetric in the upper and lower extremities. Cranial nerves II through XII are grossly intact. Proprioception is intact. No peripheral adenopathy or edema is identified. No motor or sensory levels are noted. Crude visual fields are within normal range.  RADIOLOGY RESULTS: Mammograms reviewed compatible with above-stated findings  PLAN: Present time she continues to do well with no evidence of disease.  She has mammograms ordered for next year.  She continues on letrozole without side effect.  Patient knows to call with any concerns at any time.  I would like to take this opportunity to thank you for allowing me to participate in the care of your patient.Noreene Filbert, MD

## 2019-02-03 ENCOUNTER — Other Ambulatory Visit: Payer: Self-pay | Admitting: Primary Care

## 2019-02-03 DIAGNOSIS — I10 Essential (primary) hypertension: Secondary | ICD-10-CM

## 2019-02-03 DIAGNOSIS — E785 Hyperlipidemia, unspecified: Secondary | ICD-10-CM

## 2019-02-03 DIAGNOSIS — R7303 Prediabetes: Secondary | ICD-10-CM

## 2019-02-09 ENCOUNTER — Ambulatory Visit: Payer: PPO

## 2019-02-09 ENCOUNTER — Ambulatory Visit (INDEPENDENT_AMBULATORY_CARE_PROVIDER_SITE_OTHER): Payer: PPO

## 2019-02-09 ENCOUNTER — Other Ambulatory Visit: Payer: Self-pay

## 2019-02-09 ENCOUNTER — Other Ambulatory Visit (INDEPENDENT_AMBULATORY_CARE_PROVIDER_SITE_OTHER): Payer: PPO

## 2019-02-09 DIAGNOSIS — Z0000 Encounter for general adult medical examination without abnormal findings: Secondary | ICD-10-CM

## 2019-02-09 DIAGNOSIS — I10 Essential (primary) hypertension: Secondary | ICD-10-CM

## 2019-02-09 DIAGNOSIS — R7303 Prediabetes: Secondary | ICD-10-CM

## 2019-02-09 DIAGNOSIS — E785 Hyperlipidemia, unspecified: Secondary | ICD-10-CM | POA: Diagnosis not present

## 2019-02-09 LAB — COMPREHENSIVE METABOLIC PANEL
ALT: 22 U/L (ref 0–35)
AST: 18 U/L (ref 0–37)
Albumin: 4.2 g/dL (ref 3.5–5.2)
Alkaline Phosphatase: 66 U/L (ref 39–117)
BUN: 28 mg/dL — ABNORMAL HIGH (ref 6–23)
CO2: 26 mEq/L (ref 19–32)
Calcium: 9.6 mg/dL (ref 8.4–10.5)
Chloride: 104 mEq/L (ref 96–112)
Creatinine, Ser: 1.16 mg/dL (ref 0.40–1.20)
GFR: 45.3 mL/min — ABNORMAL LOW (ref >60.00)
Glucose, Bld: 131 mg/dL — ABNORMAL HIGH (ref 70–99)
Potassium: 3.7 mEq/L (ref 3.5–5.1)
Sodium: 139 mEq/L (ref 135–145)
Total Bilirubin: 0.5 mg/dL (ref 0.2–1.2)
Total Protein: 7.3 g/dL (ref 6.0–8.3)

## 2019-02-09 LAB — LIPID PANEL
Cholesterol: 136 mg/dL (ref 0–200)
HDL: 40.9 mg/dL (ref >39.00)
LDL Cholesterol: 76 mg/dL (ref 0–99)
NonHDL: 95.04
Total CHOL/HDL Ratio: 3
Triglycerides: 96 mg/dL (ref 0.0–149.0)
VLDL: 19.2 mg/dL (ref 0.0–40.0)

## 2019-02-09 LAB — CBC
HCT: 35.7 % — ABNORMAL LOW (ref 36.0–46.0)
Hemoglobin: 12 g/dL (ref 12.0–15.0)
MCHC: 33.7 g/dL (ref 30.0–36.0)
MCV: 90.4 fl (ref 78.0–100.0)
Platelets: 243 10*3/uL (ref 150.0–400.0)
RBC: 3.95 Mil/uL (ref 3.87–5.11)
RDW: 13.6 % (ref 11.5–15.5)
WBC: 7.1 10*3/uL (ref 4.0–10.5)

## 2019-02-09 LAB — HEMOGLOBIN A1C: Hgb A1c MFr Bld: 6.9 % — ABNORMAL HIGH (ref 4.6–6.5)

## 2019-02-09 NOTE — Progress Notes (Signed)
PCP notes:  Health Maintenance: declined Shingrix    Abnormal Screenings: none    Patient concerns: none    Nurse concerns: none    Next PCP appt.: 02/16/2019 @ 9 am

## 2019-02-09 NOTE — Patient Instructions (Signed)
Catherine Tate , Thank you for taking time to come for your Medicare Wellness Visit. I appreciate your ongoing commitment to your health goals. Please review the following plan we discussed and let me know if I can assist you in the future.   Screening recommendations/referrals: Colonoscopy: no longer required Mammogram: up to date, completed 11/03/2018 Bone Density: up to date, completed 11/18/2018 Recommended yearly ophthalmology/optometry visit for glaucoma screening and checkup Recommended yearly dental visit for hygiene and checkup  Vaccinations: Influenza vaccine: up to date, completed 12/24/2018 Pneumococcal vaccine: Completed series Tdap vaccine: up to date, completed 09/10/2010 Shingles vaccine: declined    Advanced directives: copy in chart  Conditions/risks identified: hypertension, hyperlipidemia  Next appointment: 02/16/2019 @ 9 am   Preventive Care 65 Years and Older, Female Preventive care refers to lifestyle choices and visits with your health care provider that can promote health and wellness. What does preventive care include?  A yearly physical exam. This is also called an annual well check.  Dental exams once or twice a year.  Routine eye exams. Ask your health care provider how often you should have your eyes checked.  Personal lifestyle choices, including:  Daily care of your teeth and gums.  Regular physical activity.  Eating a healthy diet.  Avoiding tobacco and drug use.  Limiting alcohol use.  Practicing safe sex.  Taking low-dose aspirin every day.  Taking vitamin and mineral supplements as recommended by your health care provider. What happens during an annual well check? The services and screenings done by your health care provider during your annual well check will depend on your age, overall health, lifestyle risk factors, and family history of disease. Counseling  Your health care provider may ask you questions about your:  Alcohol use.   Tobacco use.  Drug use.  Emotional well-being.  Home and relationship well-being.  Sexual activity.  Eating habits.  History of falls.  Memory and ability to understand (cognition).  Work and work Statistician.  Reproductive health. Screening  You may have the following tests or measurements:  Height, weight, and BMI.  Blood pressure.  Lipid and cholesterol levels. These may be checked every 5 years, or more frequently if you are over 68 years old.  Skin check.  Lung cancer screening. You may have this screening every year starting at age 64 if you have a 30-pack-year history of smoking and currently smoke or have quit within the past 15 years.  Fecal occult blood test (FOBT) of the stool. You may have this test every year starting at age 48.  Flexible sigmoidoscopy or colonoscopy. You may have a sigmoidoscopy every 5 years or a colonoscopy every 10 years starting at age 53.  Hepatitis C blood test.  Hepatitis B blood test.  Sexually transmitted disease (STD) testing.  Diabetes screening. This is done by checking your blood sugar (glucose) after you have not eaten for a while (fasting). You may have this done every 1-3 years.  Bone density scan. This is done to screen for osteoporosis. You may have this done starting at age 94.  Mammogram. This may be done every 1-2 years. Talk to your health care provider about how often you should have regular mammograms. Talk with your health care provider about your test results, treatment options, and if necessary, the need for more tests. Vaccines  Your health care provider may recommend certain vaccines, such as:  Influenza vaccine. This is recommended every year.  Tetanus, diphtheria, and acellular pertussis (Tdap, Td) vaccine. You  may need a Td booster every 10 years.  Zoster vaccine. You may need this after age 16.  Pneumococcal 13-valent conjugate (PCV13) vaccine. One dose is recommended after age 40.  Pneumococcal  polysaccharide (PPSV23) vaccine. One dose is recommended after age 32. Talk to your health care provider about which screenings and vaccines you need and how often you need them. This information is not intended to replace advice given to you by your health care provider. Make sure you discuss any questions you have with your health care provider. Document Released: 04/27/2015 Document Revised: 12/19/2015 Document Reviewed: 01/30/2015 Elsevier Interactive Patient Education  2017 [place] Prevention in the Home Falls can cause injuries. They can happen to people of all ages. There are many things you can do to make your home safe and to help prevent falls. What can I do on the outside of my home?  Regularly fix the edges of walkways and driveways and fix any cracks.  Remove anything that might make you trip as you walk through a door, such as a raised step or threshold.  Trim any bushes or trees on the path to your home.  Use bright outdoor lighting.  Clear any walking paths of anything that might make someone trip, such as rocks or tools.  Regularly check to see if handrails are loose or broken. Make sure that both sides of any steps have handrails.  Any raised decks and porches should have guardrails on the edges.  Have any leaves, snow, or ice cleared regularly.  Use sand or salt on walking paths during winter.  Clean up any spills in your garage right away. This includes oil or grease spills. What can I do in the bathroom?  Use night lights.  Install grab bars by the toilet and in the tub and shower. Do not use towel bars as grab bars.  Use non-skid mats or decals in the tub or shower.  If you need to sit down in the shower, use a plastic, non-slip stool.  Keep the floor dry. Clean up any water that spills on the floor as soon as it happens.  Remove soap buildup in the tub or shower regularly.  Attach bath mats securely with double-sided non-slip rug tape.   Do not have throw rugs and other things on the floor that can make you trip. What can I do in the bedroom?  Use night lights.  Make sure that you have a light by your bed that is easy to reach.  Do not use any sheets or blankets that are too big for your bed. They should not hang down onto the floor.  Have a firm chair that has side arms. You can use this for support while you get dressed.  Do not have throw rugs and other things on the floor that can make you trip. What can I do in the kitchen?  Clean up any spills right away.  Avoid walking on wet floors.  Keep items that you use a lot in easy-to-reach places.  If you need to reach something above you, use a strong step stool that has a grab bar.  Keep electrical cords out of the way.  Do not use floor polish or wax that makes floors slippery. If you must use wax, use non-skid floor wax.  Do not have throw rugs and other things on the floor that can make you trip. What can I do with my stairs?  Do not leave any items  on the stairs.  Make sure that there are handrails on both sides of the stairs and use them. Fix handrails that are broken or loose. Make sure that handrails are as long as the stairways.  Check any carpeting to make sure that it is firmly attached to the stairs. Fix any carpet that is loose or worn.  Avoid having throw rugs at the top or bottom of the stairs. If you do have throw rugs, attach them to the floor with carpet tape.  Make sure that you have a light switch at the top of the stairs and the bottom of the stairs. If you do not have them, ask someone to add them for you. What else can I do to help prevent falls?  Wear shoes that:  Do not have high heels.  Have rubber bottoms.  Are comfortable and fit you well.  Are closed at the toe. Do not wear sandals.  If you use a stepladder:  Make sure that it is fully opened. Do not climb a closed stepladder.  Make sure that both sides of the  stepladder are locked into place.  Ask someone to hold it for you, if possible.  Clearly mark and make sure that you can see:  Any grab bars or handrails.  First and last steps.  Where the edge of each step is.  Use tools that help you move around (mobility aids) if they are needed. These include:  Canes.  Walkers.  Scooters.  Crutches.  Turn on the lights when you go into a dark area. Replace any light bulbs as soon as they burn out.  Set up your furniture so you have a clear path. Avoid moving your furniture around.  If any of your floors are uneven, fix them.  If there are any pets around you, be aware of where they are.  Review your medicines with your doctor. Some medicines can make you feel dizzy. This can increase your chance of falling. Ask your doctor what other things that you can do to help prevent falls. This information is not intended to replace advice given to you by your health care provider. Make sure you discuss any questions you have with your health care provider. Document Released: 01/25/2009 Document Revised: 09/06/2015 Document Reviewed: 05/05/2014 Elsevier Interactive Patient Education  2017 [employer].

## 2019-02-09 NOTE — Progress Notes (Signed)
Subjective:   SCOTLYN GOTSHALL is a 78 y.o. female who presents for Medicare Annual (Subsequent) preventive examination.  Review of Systems:    This visit is being conducted through telemedicine via telephone at the nurse health advisor's home address due to the COVID-19 pandemic. This patient has given me verbal consent via doximity to conduct this visit, patient states they are participating from their home address. Patient and myself are on the telephone call. There is no referral for this visit. Some vital signs may be absent or patient reported.    Patient identification: identified by name, DOB, and current address   Cardiac Risk Factors include: advanced age (>36men, >10 women);hypertension;dyslipidemia     Objective:     Vitals: There were no vitals taken for this visit.  There is no height or weight on file to calculate BMI.  Advanced Directives 02/09/2019 01/19/2019 01/18/2019 07/12/2018 02/03/2018 11/27/2017 06/22/2017  Does Patient Have a Medical Advance Directive? Yes Yes Yes Yes Yes Yes Yes  Type of Paramedic of [place];Living will [place];Living will [place];Living will [place];Living will [place];Living will [place];Living will [place];Living will  Does patient want to make changes to medical advance directive? - - No - Patient declined No - Patient declined - - -  Copy of [place] in Chart? Yes - validated most recent copy scanned in chart (See row information) Yes - validated most recent copy scanned in chart (See row information) No - copy requested - Yes Yes Yes  Would patient like information on creating a medical advance directive? - No - Patient declined No - Patient declined - - No - Patient declined -    Tobacco Social History   Tobacco Use  Smoking Status Former Smoker  . Packs/day: 1.00  .  Years: 20.00  . Pack years: 20.00  . Types: Cigarettes  . Quit date: 04/14/1994  . Years since quitting: 24.8  Smokeless Tobacco Never Used     Counseling given: Not Answered   Clinical Intake:  Pre-visit preparation completed: Yes  Pain : No/denies pain     Nutritional Risks: None Diabetes: No  How often do you need to have someone help you when you read instructions, pamphlets, or other written materials from your doctor or pharmacy?: 1 - Never What is the last grade level you completed in school?: 12th  Interpreter Needed?: No  Information entered by :: CJohnson, LPN  Past Medical History:  Diagnosis Date  . Arthritis   . 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 Cataract 02/03/2018  . Chickenpox   . Complication of anesthesia   . Frequent UTI   . GERD (gastroesophageal reflux disease)    RARE  . Hypertension   . Personal history of chemotherapy 2017/2018   F/U left breast cancer  . Personal history of radiation therapy 2018   F/U left breast cancer  . PONV (postoperative nausea and vomiting)   . RSD (reflex sympathetic dystrophy)   . Seasonal allergies   . Skin cancer   . Urine incontinence    Past Surgical History:  Procedure Laterality Date  . ABDOMINAL HYSTERECTOMY  2005   total/ Dr Davis Gourd  . BLADDER TUMOR EXCISION  2006  . BREAST BIOPSY Left 03/13/2016   INVASIVE MAMMARY CARCINOMA  . BREAST LUMPECTOMY Left 2018  . BREAST LUMPECTOMY WITH SENTINEL LYMPH NODE BIOPSY Left 07/14/2016  Procedure: BREAST LUMPECTOMY WITH SENTINEL LYMPH NODE BX;  Surgeon: [name], MD;  Location: [facility] ORS;  Service: General;  Laterality: Left;  . denture surgery    . OOPHORECTOMY    . PORTACATH PLACEMENT N/A 04/01/2016   Procedure: INSERTION PORT-A-CATH;  Surgeon: [name], MD;  Location: [facility] ORS;  Service: General;  Laterality: N/A;  . SKIN CANCER EXCISION Right 2015   foot/ Dr Nevada Crane  . TUBAL LIGATION    . TUMOR EXCISION Right     FOOT   Family History  Problem Relation Age of Onset  . Emphysema Father   . Heart disease Mother   . Transient ischemic attack Mother   . Breast cancer Cousin        paternal  . Kidney cancer Neg Hx   . Bladder Cancer Neg Hx    Social History   Socioeconomic History  . Marital status: Single    Spouse name: Not on file  . Number of children: Not on file  . Years of education: Not on file  . Highest education level: Not on file  Occupational History  . Not on file  Social Needs  . Financial resource strain: Not hard at all  . Food insecurity    Worry: Never true    Inability: Never true  . Transportation needs    Medical: No    Non-medical: No  Tobacco Use  . Smoking status: Former Smoker    Packs/day: 1.00    Years: 20.00    Pack years: 20.00    Types: Cigarettes    Quit date: 04/14/1994    Years since quitting: 24.8  . Smokeless tobacco: Never Used  Substance and Sexual Activity  . Alcohol use: No    Alcohol/week: 0.0 standard drinks  . Drug use: No  . Sexual activity: Not Currently  Lifestyle  . Physical activity    Days per week: 0 days    Minutes per session: 0 min  . Stress: Not at all  Relationships  . Social Herbalist on phone: Not on file    Gets together: Not on file    Attends religious service: Not on file    Active member of club or organization: Not on file    Attends meetings of clubs or organizations: Not on file    Relationship status: Not on file  Other Topics Concern  . Not on file  Social History Narrative   Single.   2 children, 1 grandchild.   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   Enjoys making jewelry, puzzle books, sewing, traveling.    Outpatient Encounter Medications as of 02/09/2019  Medication Sig  . acetaminophen (TYLENOL) 500 MG tablet Take 1,000 mg by mouth every 6 (six) hours as needed for moderate pain.   Marland Kitchen alendronate (FOSAMAX) 70 MG tablet Take 1 tablet (70 mg total) by mouth once a week. Take  with a full glass of water on an empty stomach.  . Ascorbic Acid (VITAMIN C) 1000 MG tablet Take 1,000 mg by mouth daily.  Marland Kitchen atorvastatin (LIPITOR) 10 MG tablet Take 1 tablet (10 mg total) by mouth daily. For cholesterol.  . Calcium Carb-Cholecalciferol (CALCIUM/VITAMIN D PO) Take 1,200 mg by mouth.  . CRANBERRY PO Take 1 capsule by mouth 2 (two) times daily.  Marland Kitchen estradiol (ESTRACE) 0.1 MG/GM vaginal cream Place 1 g vaginally 2 (two) times a week. For UTI prevention.  . hydrochlorothiazide (HYDRODIURIL) 25 MG tablet TAKE 1  TABLET BY MOUTH ONCE DAILY  . letrozole (FEMARA) 2.5 MG tablet Take 1 tablet (2.5 mg total) by mouth daily.  Marland Kitchen losartan (COZAAR) 50 MG tablet TAKE 1 TABLET BY MOUTH ONCE A DAY  . Probiotic Product (PROBIOTIC PO) Take 1 tablet by mouth daily.   No facility-administered encounter medications on file as of 02/09/2019.     Activities of Daily Living In your present state of health, do you have any difficulty performing the following activities: 02/09/2019  Hearing? N  Vision? N  Difficulty concentrating or making decisions? N  Walking or climbing stairs? N  Dressing or bathing? N  Doing errands, shopping? N  Preparing Food and eating ? N  Using the Toilet? N  In the past six months, have you accidently leaked urine? Y  Comment wears pad  Do you have problems with loss of bowel control? N  Managing your Medications? N  Managing your Finances? N  Housekeeping or managing your Housekeeping? N  Some recent data might be hidden    Patient Care Team: [name], NP as PCP - General (Internal Medicine) Bary Castilla, [name], MD (General Surgery) [name], MD as Consulting Physician (Family Medicine)    Assessment:   This is a routine wellness examination for Jolynne.  Exercise Activities and Dietary recommendations Current Exercise Habits: The patient does not participate in regular exercise at present, Exercise limited by: None identified  Goals    .  Increase water intake     Starting 02/03/2018, I will attempt to drink at least 6-8 glasses of water daily.     . Patient Stated     02/09/2019, I will maintain and continue medications as prescribed.        Fall Risk Fall Risk  02/09/2019 11/15/2018 05/06/2018 02/03/2018 02/02/2017  Falls in the past year? 0 0 0 No No  Number falls in past yr: 0 - - - -  Injury with Fall? 0 - - - -  Risk for fall due to : Medication side effect - - - -  Follow up Falls evaluation completed;Falls prevention discussed - - - -   Is the patient's home free of loose throw rugs in walkways, pet beds, electrical cords, etc?   yes      Grab bars in the bathroom? yes      Handrails on the stairs?   yes      Adequate lighting?   yes  Timed Get Up and Go performed: N/A  Depression Screen PHQ 2/9 Scores 02/09/2019 02/03/2018 02/02/2017 11/05/2015  PHQ - 2 Score 0 0 0 0  PHQ- 9 Score 0 0 0 -     Cognitive Function MMSE - Mini Mental State Exam 02/09/2019 02/03/2018 02/02/2017  Orientation to time 5 5 5   Orientation to Place 5 5 5   Registration 3 3 3   Attention/ Calculation 5 0 0  Recall 3 3 2   Recall-comments - - unable to recall 1 of 3 words  Language- name 2 objects - 0 0  Language- repeat 1 1 1   Language- follow 3 step command - 3 2  Language- follow 3 step command-comments - - unable to follow 1 step of 3 step command  Language- read & follow direction - 0 0  Write a sentence - 0 0  Copy design - 0 0  Total score - 20 18  Mini Cog  Mini-Cog screen was completed. Maximum score is 22. A value of 0 denotes this part of the  MMSE was not completed or the patient failed this part of the Mini-Cog screening.       Immunization History  Administered Date(s) Administered  . Fluad Quad(high Dose 65+) 12/24/2018  . Influenza,inj,Quad PF,6+ Mos 03/03/2016, 02/02/2017, 01/11/2018  . Pneumococcal Conjugate-13 11/05/2015  . Pneumococcal Polysaccharide-23 02/09/2017  . Tdap 09/10/2010  . Zoster  04/15/1999    Qualifies for Shingles Vaccine? Yes  Screening Tests Health Maintenance  Topic Date Due  . TETANUS/TDAP  09/09/2020  . INFLUENZA VACCINE  Completed  . DEXA SCAN  Completed  . PNA vac Low Risk Adult  Completed    Cancer Screenings: Lung: Low Dose CT Chest recommended if Age 33-80 years, 30 pack-year currently smoking OR have quit w/in 15years. Patient does not qualify. Breast:  Up to date on Mammogram? Yes, completed 11/03/2018   Up to date of Bone Density/Dexa? Yes, completed 11/18/2018 Colorectal: no longer required  Additional Screenings:  Hepatitis C Screening: N/A     Plan:    Patient wants to maintain and continue medications as prescribed.    I have personally reviewed and noted the following in the patient's chart:   . Medical and social history . Use of alcohol, tobacco or illicit drugs  . Current medications and supplements . Functional ability and status . Nutritional status . Physical activity . Advanced directives . List of other physicians . Hospitalizations, surgeries, and ER visits in previous 12 months . Vitals . Screenings to include cognitive, depression, and falls . Referrals and appointments  In addition, I have reviewed and discussed with patient certain preventive protocols, quality metrics, and best practice recommendations. A written personalized care plan for preventive services as well as general preventive health recommendations were provided to patient.     Andrez Grime, LPN  [id number]

## 2019-02-16 ENCOUNTER — Ambulatory Visit (INDEPENDENT_AMBULATORY_CARE_PROVIDER_SITE_OTHER): Payer: PPO | Admitting: Primary Care

## 2019-02-16 ENCOUNTER — Other Ambulatory Visit: Payer: Self-pay

## 2019-02-16 ENCOUNTER — Encounter: Payer: Self-pay | Admitting: Primary Care

## 2019-02-16 VITALS — BP 124/82 | HR 121 | Temp 97.8°F | Ht 67.0 in | Wt 160.8 lb

## 2019-02-16 DIAGNOSIS — M810 Age-related osteoporosis without current pathological fracture: Secondary | ICD-10-CM | POA: Diagnosis not present

## 2019-02-16 DIAGNOSIS — I10 Essential (primary) hypertension: Secondary | ICD-10-CM | POA: Diagnosis not present

## 2019-02-16 DIAGNOSIS — E785 Hyperlipidemia, unspecified: Secondary | ICD-10-CM | POA: Diagnosis not present

## 2019-02-16 DIAGNOSIS — E119 Type 2 diabetes mellitus without complications: Secondary | ICD-10-CM

## 2019-02-16 DIAGNOSIS — R300 Dysuria: Secondary | ICD-10-CM | POA: Diagnosis not present

## 2019-02-16 DIAGNOSIS — E1165 Type 2 diabetes mellitus with hyperglycemia: Secondary | ICD-10-CM | POA: Insufficient documentation

## 2019-02-16 DIAGNOSIS — N1831 Chronic kidney disease, stage 3a: Secondary | ICD-10-CM

## 2019-02-16 DIAGNOSIS — C50412 Malignant neoplasm of upper-outer quadrant of left female breast: Secondary | ICD-10-CM | POA: Diagnosis not present

## 2019-02-16 DIAGNOSIS — R000 Tachycardia, unspecified: Secondary | ICD-10-CM | POA: Insufficient documentation

## 2019-02-16 DIAGNOSIS — Z170 Estrogen receptor positive status [ER+]: Secondary | ICD-10-CM

## 2019-02-16 DIAGNOSIS — R32 Unspecified urinary incontinence: Secondary | ICD-10-CM

## 2019-02-16 DIAGNOSIS — Z0000 Encounter for general adult medical examination without abnormal findings: Secondary | ICD-10-CM

## 2019-02-16 LAB — POC URINALSYSI DIPSTICK (AUTOMATED)
Bilirubin, UA: NEGATIVE
Blood, UA: NEGATIVE
Glucose, UA: NEGATIVE
Ketones, UA: NEGATIVE
Leukocytes, UA: NEGATIVE
Nitrite, UA: NEGATIVE
Protein, UA: NEGATIVE
Spec Grav, UA: 1.015 (ref 1.010–1.025)
Urobilinogen, UA: 0.2 E.U./dL
pH, UA: 5.5 (ref 5.0–8.0)

## 2019-02-16 NOTE — Assessment & Plan Note (Addendum)
Stable in the office today, continue losartan and HCTZ. Consider switching from HCTZ to Amlodipine given urinary incontinence and CKD. Patient declines today and does not want to switch. BMP reviewed and is stable.

## 2019-02-16 NOTE — Assessment & Plan Note (Signed)
Immunizations UTD. Declines colon cancer screening given age. Mammogram and bone density scan UTD. Encouraged regular exercise and healthy diet.  Exam today stable. Labs reviewed.

## 2019-02-16 NOTE — Addendum Note (Signed)
Addended by: Jacqualin Combes on: 02/16/2019 09:41 AM   Modules accepted: Orders

## 2019-02-16 NOTE — Assessment & Plan Note (Signed)
Improved per recent bone density scan.  Compliant to alendronate, calcium, vitamin D. Repeat bone density next year.

## 2019-02-16 NOTE — Assessment & Plan Note (Signed)
Reviewed mammogram from July 2020. Following with oncology, radiation, and general surgery. Continue Femara and annual mammograms.

## 2019-02-16 NOTE — Progress Notes (Signed)
Subjective:    Patient ID: Catherine Tate, female    DOB: [birth year], 78 y.o.   MRN: [id number]  HPI  [name] is a 78 year old female who presents today for complete physical.   She would also like to discuss dysuria. She has a history of recurrent UTI with her last UTI being over one year ago. She was managed on Premarin cream twice weekly for UTI prevention but she is no longer taking due to cost. She denies hematuria, urinary frequency. She does have urinary incontinence for years since bladder tumor excision. Also history of hysterectomy.    Immunizations: -Tetanus: Completed in 2012 -Influenza: Completed this season  -Shingles: Completed in 2001 -Pneumonia: Completed last in 2018  Diet: She endorses a fair diet. She is eating vegetables, fruit, some meat, starch. Drinking water, occasional soda, coffee. Desserts frequently.  Exercise: She is not exercising.  Eye exam: Completed in 2019 Dental exam: No recent visit   Mammogram: Completed in July 2020 Dexa: Completed in 2020, osteopenia. Compliant to Fosamax and calcium and vitamin D. Colonoscopy: Declines due to age  BP Readings from Last 3 Encounters:  02/16/19 124/82  01/19/19 (!) 150/92  01/19/19 115/68     Review of Systems  Constitutional: Negative for unexpected weight change.  HENT: Negative for rhinorrhea.   Respiratory: Negative for cough and shortness of breath.   Cardiovascular: Negative for chest pain.  Gastrointestinal: Negative for constipation and diarrhea.  Genitourinary: Positive for dysuria. Negative for difficulty urinating and frequency.  Musculoskeletal: Positive for arthralgias and back pain.  Skin: Negative for rash.  Allergic/Immunologic: Negative for environmental allergies.  Neurological: Negative for dizziness and headaches.  Psychiatric/Behavioral: The patient is not nervous/anxious.        Past Medical History:  Diagnosis Date  . Arthritis   . Cancer ([place]) 03/13/2016   INVASIVE  MAMMARY CARCINOMA  . Cataract 02/03/2018  . Chickenpox   . Complication of anesthesia   . Frequent UTI   . GERD (gastroesophageal reflux disease)    RARE  . Hypertension   . Personal history of chemotherapy 2017/2018   F/U left breast cancer  . Personal history of radiation therapy 2018   F/U left breast cancer  . PONV (postoperative nausea and vomiting)   . RSD (reflex sympathetic dystrophy)   . Seasonal allergies   . Skin cancer   . Urine incontinence      Social History   Socioeconomic History  . Marital status: Single    Spouse name: Not on file  . Number of children: Not on file  . Years of education: Not on file  . Highest education level: Not on file  Occupational History  . Not on file  Social Needs  . Financial resource strain: Not hard at all  . Food insecurity    Worry: Never true    Inability: Never true  . Transportation needs    Medical: No    Non-medical: No  Tobacco Use  . Smoking status: Former Smoker    Packs/day: 1.00    Years: 20.00    Pack years: 20.00    Types: Cigarettes    Quit date: 04/14/1994    Years since quitting: 24.8  . Smokeless tobacco: Never Used  Substance and Sexual Activity  . Alcohol use: No    Alcohol/week: 0.0 standard drinks  . Drug use: No  . Sexual activity: Not Currently  Lifestyle  . Physical activity    Days per week: 0 days  Minutes per session: 0 min  . Stress: Not at all  Relationships  . Social Herbalist on phone: Not on file    Gets together: Not on file    Attends religious service: Not on file    Active member of club or organization: Not on file    Attends meetings of clubs or organizations: Not on file    Relationship status: Not on file  . Intimate partner violence    Fear of current or ex partner: No    Emotionally abused: No    Physically abused: No    Forced sexual activity: No  Other Topics Concern  . Not on file  Social History Narrative   Single.   2 children, 1  grandchild.   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   Enjoys making jewelry, puzzle books, sewing, traveling.    Past Surgical History:  Procedure Laterality Date  . ABDOMINAL HYSTERECTOMY  2005   total/ Dr Davis Gourd  . BLADDER TUMOR EXCISION  2006  . BREAST BIOPSY Left 03/13/2016   INVASIVE MAMMARY CARCINOMA  . BREAST LUMPECTOMY Left 2018  . BREAST LUMPECTOMY WITH SENTINEL LYMPH NODE BIOPSY Left 07/14/2016   Procedure: BREAST LUMPECTOMY WITH SENTINEL LYMPH NODE BX;  Surgeon: [name], MD;  Location: [facility] ORS;  Service: General;  Laterality: Left;  . denture surgery    . OOPHORECTOMY    . PORTACATH PLACEMENT N/A 04/01/2016   Procedure: INSERTION PORT-A-CATH;  Surgeon: [name], MD;  Location: [facility] ORS;  Service: General;  Laterality: N/A;  . SKIN CANCER EXCISION Right 2015   foot/ Dr Nevada Crane  . TUBAL LIGATION    . TUMOR EXCISION Right    FOOT    Family History  Problem Relation Age of Onset  . Emphysema Father   . Heart disease Mother   . Transient ischemic attack Mother   . Breast cancer Cousin        paternal  . Kidney cancer Neg Hx   . Bladder Cancer Neg Hx     Allergies  Allergen Reactions  . Bee Pollen Hives  . Mosquito (Diagnostic) Hives  . Other Other (See Comments)    Uncoded Allergy. Allergen: VICRYL  (90% glycolide and 10% L-lactide [polyglactin-- synthetic absorbable sterile surgical suture]), Other Reaction: Infection  . Gabapentin Swelling  . Norvasc [Amlodipine Besylate] Swelling    TO FEET  . Lisinopril Cough    Current Outpatient Medications on File Prior to Visit  Medication Sig Dispense Refill  . acetaminophen (TYLENOL) 500 MG tablet Take 1,000 mg by mouth every 6 (six) hours as needed for moderate pain.     Marland Kitchen alendronate (FOSAMAX) 70 MG tablet Take 1 tablet (70 mg total) by mouth once a week. Take with a full glass of water on an empty stomach. 12 tablet 6  . Ascorbic Acid (VITAMIN C) 1000 MG tablet Take 1,000 mg by mouth  daily.    Marland Kitchen atorvastatin (LIPITOR) 10 MG tablet Take 1 tablet (10 mg total) by mouth daily. For cholesterol. 90 tablet 3  . Calcium Carb-Cholecalciferol (CALCIUM/VITAMIN D PO) Take 1,200 mg by mouth.    . CRANBERRY PO Take 1 capsule by mouth 2 (two) times daily.    . hydrochlorothiazide (HYDRODIURIL) 25 MG tablet TAKE 1 TABLET BY MOUTH ONCE DAILY 90 tablet 1  . letrozole (FEMARA) 2.5 MG tablet Take 1 tablet (2.5 mg total) by mouth daily. 90 tablet 3  . losartan (COZAAR) 50 MG tablet  TAKE 1 TABLET BY MOUTH ONCE A DAY 90 tablet 1  . Probiotic Product (PROBIOTIC PO) Take 1 tablet by mouth daily.    Marland Kitchen estradiol (ESTRACE) 0.1 MG/GM vaginal cream Place 1 g vaginally 2 (two) times a week. For UTI prevention. (Patient not taking: Reported on 02/16/2019) 42.5 g 0   No current facility-administered medications on file prior to visit.     BP 124/82   Pulse (!) 121   Temp 97.8 F (36.6 C) (Temporal)   Ht 5\' 7"  (1.702 m)   Wt 160 lb 12 oz (72.9 kg)   SpO2 95%   BMI 25.18 kg/m    Objective:   Physical Exam  Constitutional: She is oriented to person, place, and time. She appears well-nourished.  HENT:  Right Ear: Tympanic membrane and ear canal normal.  Left Ear: Tympanic membrane and ear canal normal.  Mouth/Throat: Oropharynx is clear and moist.  Eyes: Pupils are equal, round, and reactive to light. EOM are normal.  Neck: Neck supple.  Cardiovascular: Regular rhythm.  Sinus tachycardia   Respiratory: Effort normal and breath sounds normal.  GI: Soft. Bowel sounds are normal. There is no abdominal tenderness.  Musculoskeletal: Normal range of motion.  Neurological: She is alert and oriented to person, place, and time.  Skin: Skin is warm and dry.  Psychiatric: She has a normal mood and affect.           Assessment & Plan:

## 2019-02-16 NOTE — Assessment & Plan Note (Signed)
New diagnosis with A1C of 6.9. Discussed to work on diet, start exercising. No treatment needed at this time given age, but will closely monitor.  Managed on statin and ARB. Discussed to schedule eye exam. Foot exam today. Pneumonia vaccination UTD.  Follow up in 3 months.

## 2019-02-16 NOTE — Assessment & Plan Note (Addendum)
Acute for the last several days. History of recurrent UTI, urinary incontinence. No longer using Premarin cream.  UA today negative. Culture sent given history. Await labs.

## 2019-02-16 NOTE — Assessment & Plan Note (Signed)
Stable on recent labs. Continue to monitor. Continue to avoid NSAID's.

## 2019-02-16 NOTE — Assessment & Plan Note (Addendum)
Noted today, also on prior office visits with other providers. Patient endorses that she gets nervous during office visits. Asymptomatic.  Labs without anemia. TSH from 2019 unremarkable. No obvious cause from medications.  Will have her start monitoring HR at home and notify if readings are at or above 100 on a consistent basis. Consider ECG and low dose beta blocker at that time.

## 2019-02-16 NOTE — Patient Instructions (Addendum)
Start monitoring your heart rate at home, please notify me if you see readings at or above 100 on a consistent basis.  You have type 2 diabetes.   It is important that you improve your diet. Please limit carbohydrates in the form of white bread, rice, pasta, sweets, fast food, fried food, sugary drinks, etc. Increase your consumption of fresh fruits and vegetables, whole grains, lean protein.  Ensure you are consuming 64 ounces of water daily.  Start walking daily.  Please schedule a follow up appointment in 3 months for diabetes check.  It was a pleasure to see you today!   Preventive Care 77 Years and Older, Female Preventive care refers to lifestyle choices and visits with your health care provider that can promote health and wellness. This includes:  A yearly physical exam. This is also called an annual well check.  Regular dental and eye exams.  Immunizations.  Screening for certain conditions.  Healthy lifestyle choices, such as diet and exercise. What can I expect for my preventive care visit? Physical exam Your health care provider will check:  Height and weight. These may be used to calculate body mass index (BMI), which is a measurement that tells if you are at a healthy weight.  Heart rate and blood pressure.  Your skin for abnormal spots. Counseling Your health care provider may ask you questions about:  Alcohol, tobacco, and drug use.  Emotional well-being.  Home and relationship well-being.  Sexual activity.  Eating habits.  History of falls.  Memory and ability to understand (cognition).  Work and work Statistician.  Pregnancy and menstrual history. What immunizations do I need?  Influenza (flu) vaccine  This is recommended every year. Tetanus, diphtheria, and pertussis (Tdap) vaccine  You may need a Td booster every 10 years. Varicella (chickenpox) vaccine  You may need this vaccine if you have not already been vaccinated. Zoster  (shingles) vaccine  You may need this after age 77. Pneumococcal conjugate (PCV13) vaccine  One dose is recommended after age 77. Pneumococcal polysaccharide (PPSV23) vaccine  One dose is recommended after age 77. Measles, mumps, and rubella (MMR) vaccine  You may need at least one dose of MMR if you were born in 1957 or later. You may also need a second dose. Meningococcal conjugate (MenACWY) vaccine  You may need this if you have certain conditions. Hepatitis A vaccine  You may need this if you have certain conditions or if you travel or work in places where you may be exposed to hepatitis A. Hepatitis B vaccine  You may need this if you have certain conditions or if you travel or work in places where you may be exposed to hepatitis B. Haemophilus influenzae type b (Hib) vaccine  You may need this if you have certain conditions. You may receive vaccines as individual doses or as more than one vaccine together in one shot (combination vaccines). Talk with your health care provider about the risks and benefits of combination vaccines. What tests do I need? Blood tests  Lipid and cholesterol levels. These may be checked every 5 years, or more frequently depending on your overall health.  Hepatitis C test.  Hepatitis B test. Screening  Lung cancer screening. You may have this screening every year starting at age 77 if you have a 30-pack-year history of smoking and currently smoke or have quit within the past 15 years.  Colorectal cancer screening. All adults should have this screening starting at age 40 and continuing until age  77. Your health care provider may recommend screening at age 70 if you are at increased risk. You will have tests every 1-10 years, depending on your results and the type of screening test.  Diabetes screening. This is done by checking your blood sugar (glucose) after you have not eaten for a while (fasting). You may have this done every 1-3  years.  Mammogram. This may be done every 1-2 years. Talk with your health care provider about how often you should have regular mammograms.  BRCA-related cancer screening. This may be done if you have a family history of breast, ovarian, tubal, or peritoneal cancers. Other tests  Sexually transmitted disease (STD) testing.  Bone density scan. This is done to screen for osteoporosis. You may have this done starting at age 77 Follow these instructions at home: Eating and drinking  Eat a diet that includes fresh fruits and vegetables, whole grains, lean protein, and low-fat dairy products. Limit your intake of foods with high amounts of sugar, saturated fats, and salt.  Take vitamin and mineral supplements as recommended by your health care provider.  Do not drink alcohol if your health care provider tells you not to drink.  If you drink alcohol: ? Limit how much you have to 0-1 drink a day. ? Be aware of how much alcohol is in your drink. In the [place] one drink equals one 12 oz bottle of beer (355 mL), one 5 oz glass of wine (148 mL), or one 1 oz glass of hard liquor (44 mL). Lifestyle  Take daily care of your teeth and gums.  Stay active. Exercise for at least 30 minutes on 5 or more days each week.  Do not use any products that contain nicotine or tobacco, such as cigarettes, e-cigarettes, and chewing tobacco. If you need help quitting, ask your health care provider.  If you are sexually active, practice safe sex. Use a condom or other form of protection in order to prevent STIs (sexually transmitted infections).  Talk with your health care provider about taking a low-dose aspirin or statin. What's next?  Go to your health care provider once a year for a well check visit.  Ask your health care provider how often you should have your eyes and teeth checked.  Stay up to date on all vaccines. This information is not intended to replace advice given to you by your health care  provider. Make sure you discuss any questions you have with your health care provider. Document Released: 04/27/2015 Document Revised: 03/25/2018 Document Reviewed: 03/25/2018 Elsevier Patient Education  2020 [employer].

## 2019-02-16 NOTE — Assessment & Plan Note (Addendum)
Chronic since bladder tumor excision in 2006, also history of hysterectomy.  Offered to stop HCTZ and switch to Amlodipine in an attempt to reduce frequency and incontinence, patient declines.   Continue to monitor as she is overall stable.

## 2019-02-16 NOTE — Assessment & Plan Note (Signed)
Recent lipid panel stable. Continue atorvastatin. 

## 2019-02-18 LAB — URINE CULTURE
MICRO NUMBER:: 1064192
SPECIMEN QUALITY:: ADEQUATE

## 2019-03-23 ENCOUNTER — Other Ambulatory Visit: Payer: Self-pay | Admitting: Primary Care

## 2019-03-23 DIAGNOSIS — E785 Hyperlipidemia, unspecified: Secondary | ICD-10-CM

## 2019-03-23 DIAGNOSIS — I10 Essential (primary) hypertension: Secondary | ICD-10-CM

## 2019-04-21 ENCOUNTER — Ambulatory Visit: Payer: PPO | Attending: Internal Medicine

## 2019-04-21 DIAGNOSIS — Z20822 Contact with and (suspected) exposure to covid-19: Secondary | ICD-10-CM

## 2019-04-23 ENCOUNTER — Telehealth: Payer: Self-pay | Admitting: Primary Care

## 2019-04-23 LAB — NOVEL CORONAVIRUS, NAA: SARS-CoV-2, NAA: NOT DETECTED

## 2019-04-23 NOTE — Telephone Encounter (Signed)
Pt aware covid lab test negative, not detected °

## 2019-05-19 ENCOUNTER — Other Ambulatory Visit: Payer: Self-pay

## 2019-05-19 ENCOUNTER — Encounter: Payer: Self-pay | Admitting: Primary Care

## 2019-05-19 ENCOUNTER — Ambulatory Visit (INDEPENDENT_AMBULATORY_CARE_PROVIDER_SITE_OTHER): Payer: PPO | Admitting: Primary Care

## 2019-05-19 VITALS — BP 124/80 | HR 80 | Temp 96.5°F | Ht 67.0 in | Wt 153.3 lb

## 2019-05-19 DIAGNOSIS — E119 Type 2 diabetes mellitus without complications: Secondary | ICD-10-CM | POA: Diagnosis not present

## 2019-05-19 LAB — POCT GLYCOSYLATED HEMOGLOBIN (HGB A1C): Hemoglobin A1C: 6.1 % — AB (ref 4.0–5.6)

## 2019-05-19 NOTE — Assessment & Plan Note (Addendum)
A1C today with significant improvement to 6.1.  Continue off medications.  Discussed to schedule eye exam. Foot exam today. Managed on statin and ARB. Pneumonia vaccination UTD.  Follow up in November 2021.

## 2019-05-19 NOTE — Progress Notes (Signed)
Subjective:    Patient ID: Catherine Tate, female    DOB: May 10, 1940, 79 y.o.   MRN: [id number]  HPI  This visit occurred during the SARS-CoV-2 public health emergency.  Safety protocols were in place, including screening questions prior to the visit, additional usage of staff PPE, and extensive cleaning of exam room while observing appropriate contact time as indicated for disinfecting solutions.   Catherine Tate is a 79 year old female with a history of hypertension, type 2 diabetes, CKD, breast cancer, hyperlipidemia who presents today for follow up of diabetes.  Current medications include: None  She is checking her blood glucose _ times daily and is getting readings of  Last A1C: 6.9 in October 2020, 6.1 today Last Eye Exam: Last Foot Exam: Due Pneumonia Vaccination: Completed in 2018 ACE/ARB: losartan Statin: atorvastatin   BP Readings from Last 3 Encounters:  05/19/19 124/80  02/16/19 124/82  01/19/19 (!) 150/92     Review of Systems  Eyes: Negative for visual disturbance.  Respiratory: Negative for shortness of breath.   Cardiovascular: Negative for chest pain.  Neurological: Negative for dizziness and headaches.       Past Medical History:  Diagnosis Date  . Arthritis   . Cancer ([place]) 03/13/2016   INVASIVE MAMMARY CARCINOMA  . Cataract 02/03/2018  . Chickenpox   . Complication of anesthesia   . Frequent UTI   . GERD (gastroesophageal reflux disease)    RARE  . Hypertension   . Personal history of chemotherapy 2017/2018   F/U left breast cancer  . Personal history of radiation therapy 2018   F/U left breast cancer  . PONV (postoperative nausea and vomiting)   . RSD (reflex sympathetic dystrophy)   . Seasonal allergies   . Skin cancer   . Urine incontinence      Social History   Socioeconomic History  . Marital status: Single    Spouse name: Not on file  . Number of children: Not on file  . Years of education: Not on file  . Highest education  level: Not on file  Occupational History  . Not on file  Tobacco Use  . Smoking status: Former Smoker    Packs/day: 1.00    Years: 20.00    Pack years: 20.00    Types: Cigarettes    Quit date: 04/14/1994    Years since quitting: 25.1  . Smokeless tobacco: Never Used  Substance and Sexual Activity  . Alcohol use: No    Alcohol/week: 0.0 standard drinks  . Drug use: No  . Sexual activity: Not Currently  Other Topics Concern  . Not on file  Social History Narrative   Single.   2 children, 1 grandchild.   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   Enjoys making jewelry, puzzle books, sewing, traveling.   Social Determinants of Health   Financial Resource Strain: Low Risk   . Difficulty of Paying Living Expenses: Not hard at all  Food Insecurity: No Food Insecurity  . Worried About Charity fundraiser in the Last Year: Never true  . Ran Out of Food in the Last Year: Never true  Transportation Needs: No Transportation Needs  . Lack of Transportation (Medical): No  . Lack of Transportation (Non-Medical): No  Physical Activity: Inactive  . Days of Exercise per Week: 0 days  . Minutes of Exercise per Session: 0 min  Stress: No Stress Concern Present  . Feeling of Stress : Not at all  Social  Connections:   . Frequency of Communication with Friends and Family: Not on file  . Frequency of Social Gatherings with Friends and Family: Not on file  . Attends Religious Services: Not on file  . Active Member of Clubs or Organizations: Not on file  . Attends Archivist Meetings: Not on file  . Marital Status: Not on file  Intimate Partner Violence: Not At Risk  . Fear of Current or Ex-Partner: No  . Emotionally Abused: No  . Physically Abused: No  . Sexually Abused: No    Past Surgical History:  Procedure Laterality Date  . ABDOMINAL HYSTERECTOMY  2005   total/ Dr Davis Gourd  . BLADDER TUMOR EXCISION  2006  . BREAST BIOPSY Left 03/13/2016   INVASIVE MAMMARY  CARCINOMA  . BREAST LUMPECTOMY Left 2018  . BREAST LUMPECTOMY WITH SENTINEL LYMPH NODE BIOPSY Left 07/14/2016   Procedure: BREAST LUMPECTOMY WITH SENTINEL LYMPH NODE BX;  Surgeon: [name], MD;  Location: [facility] ORS;  Service: General;  Laterality: Left;  . denture surgery    . OOPHORECTOMY    . PORTACATH PLACEMENT N/A 04/01/2016   Procedure: INSERTION PORT-A-CATH;  Surgeon: [name], MD;  Location: [facility] ORS;  Service: General;  Laterality: N/A;  . SKIN CANCER EXCISION Right 2015   foot/ Dr [place] Crane  . TUBAL LIGATION    . TUMOR EXCISION Right    FOOT    Family History  Problem Relation Age of Onset  . Emphysema Father   . Heart disease Mother   . Transient ischemic attack Mother   . Breast cancer Cousin        paternal  . Kidney cancer Neg Hx   . Bladder Cancer Neg Hx     Allergies  Allergen Reactions  . Bee Pollen Hives  . Mosquito (Diagnostic) Hives  . Other Other (See Comments)    Uncoded Allergy. Allergen: VICRYL  (90% glycolide and 10% L-lactide [polyglactin-- synthetic absorbable sterile surgical suture]), Other Reaction: Infection  . Gabapentin Swelling  . Norvasc [Amlodipine Besylate] Swelling    TO FEET  . Lisinopril Cough    Current Outpatient Medications on File Prior to Visit  Medication Sig Dispense Refill  . acetaminophen (TYLENOL) 500 MG tablet Take 1,000 mg by mouth every 6 (six) hours as needed for moderate pain.     Marland Kitchen alendronate (FOSAMAX) 70 MG tablet Take 1 tablet (70 mg total) by mouth once a week. Take with a full glass of water on an empty stomach. 12 tablet 6  . Ascorbic Acid (VITAMIN C) 1000 MG tablet Take 1,000 mg by mouth daily.    Marland Kitchen atorvastatin (LIPITOR) 10 MG tablet TAKE 1 TABLET BY MOUTH ONCE A DAY FOR CHOLSTEROL. 90 tablet 3  . Calcium Carb-Cholecalciferol (CALCIUM/VITAMIN D PO) Take 1,200 mg by mouth.    . CRANBERRY PO Take 1 capsule by mouth 2 (two) times daily.    . hydrochlorothiazide (HYDRODIURIL) 25 MG tablet TAKE 1 TABLET  BY MOUTH ONCE DAILY 90 tablet 1  . letrozole (FEMARA) 2.5 MG tablet Take 1 tablet (2.5 mg total) by mouth daily. 90 tablet 3  . losartan (COZAAR) 50 MG tablet TAKE 1 TABLET BY MOUTH ONCE A DAY 90 tablet 1  . Probiotic Product (PROBIOTIC PO) Take 1 tablet by mouth daily.     No current facility-administered medications on file prior to visit.    BP 124/80   Pulse 80   Temp (!) 96.5 F (35.8 C) (Temporal)   Ht  5\' 7"  (1.702 m)   Wt 153 lb 5 oz (69.5 kg)   SpO2 98%   BMI 24.01 kg/m    Objective:   Physical Exam  Constitutional: She appears well-nourished.  Cardiovascular: Normal rate and regular rhythm.  Respiratory: Effort normal and breath sounds normal.  Musculoskeletal:     Cervical back: Neck supple.  Skin: Skin is warm and dry.  Psychiatric: She has a normal mood and affect.           Assessment & Plan:

## 2019-05-19 NOTE — Patient Instructions (Addendum)
It is important that you improve your diet. Please limit carbohydrates in the form of white bread, rice, pasta, sweets, fast food, fried food, sugary drinks, etc. Increase your consumption of fresh fruits and vegetables, whole grains, lean protein.  Ensure you are consuming 64 ounces of water daily.  Please schedule a physical with me and the Wellness Nurse in November 2021. You may also schedule a lab only appointment 3-4 days prior. We will discuss your lab results in detail during your physical.  It was a pleasure to see you today!    Diabetes Mellitus and Nutrition, Adult When you have diabetes (diabetes mellitus), it is very important to have healthy eating habits because your blood sugar (glucose) levels are greatly affected by what you eat and drink. Eating healthy foods in the appropriate amounts, at about the same times every day, can help you:  Control your blood glucose.  Lower your risk of heart disease.  Improve your blood pressure.  Reach or maintain a healthy weight. Every person with diabetes is different, and each person has different needs for a meal plan. Your health care provider may recommend that you work with a diet and nutrition specialist (dietitian) to make a meal plan that is best for you. Your meal plan may vary depending on factors such as:  The calories you need.  The medicines you take.  Your weight.  Your blood glucose, blood pressure, and cholesterol levels.  Your activity level.  Other health conditions you have, such as heart or kidney disease. How do carbohydrates affect me? Carbohydrates, also called carbs, affect your blood glucose level more than any other type of food. Eating carbs naturally raises the amount of glucose in your blood. Carb counting is a method for keeping track of how many carbs you eat. Counting carbs is important to keep your blood glucose at a healthy level, especially if you use insulin or take certain oral diabetes  medicines. It is important to know how many carbs you can safely have in each meal. This is different for every person. Your dietitian can help you calculate how many carbs you should have at each meal and for each snack. Foods that contain carbs include:  Bread, cereal, rice, pasta, and crackers.  Potatoes and corn.  Peas, beans, and lentils.  Milk and yogurt.  Fruit and juice.  Desserts, such as cakes, cookies, ice cream, and candy. How does alcohol affect me? Alcohol can cause a sudden decrease in blood glucose (hypoglycemia), especially if you use insulin or take certain oral diabetes medicines. Hypoglycemia can be a life-threatening condition. Symptoms of hypoglycemia (sleepiness, dizziness, and confusion) are similar to symptoms of having too much alcohol. If your health care provider says that alcohol is safe for you, follow these guidelines:  Limit alcohol intake to no more than 1 drink per day for nonpregnant women and 2 drinks per day for men. One drink equals 12 oz of beer, 5 oz of wine, or 1 oz of hard liquor.  Do not drink on an empty stomach.  Keep yourself hydrated with water, diet soda, or unsweetened iced tea.  Keep in mind that regular soda, juice, and other mixers may contain a lot of sugar and must be counted as carbs. What are tips for following this plan?  Reading food labels  Start by checking the serving size on the "Nutrition Facts" label of packaged foods and drinks. The amount of calories, carbs, fats, and other nutrients listed on the label is  based on one serving of the item. Many items contain more than one serving per package.  Check the total grams (g) of carbs in one serving. You can calculate the number of servings of carbs in one serving by dividing the total carbs by 15. For example, if a food has 30 g of total carbs, it would be equal to 2 servings of carbs.  Check the number of grams (g) of saturated and trans fats in one serving. Choose foods  that have low or no amount of these fats.  Check the number of milligrams (mg) of salt (sodium) in one serving. Most people should limit total sodium intake to less than 2,300 mg per day.  Always check the nutrition information of foods labeled as "low-fat" or "nonfat". These foods may be higher in added sugar or refined carbs and should be avoided.  Talk to your dietitian to identify your daily goals for nutrients listed on the label. Shopping  Avoid buying canned, premade, or processed foods. These foods tend to be high in fat, sodium, and added sugar.  Shop around the outside edge of the grocery store. This includes fresh fruits and vegetables, bulk grains, fresh meats, and fresh dairy. Cooking  Use low-heat cooking methods, such as baking, instead of high-heat cooking methods like deep frying.  Cook using healthy oils, such as olive, canola, or sunflower oil.  Avoid cooking with butter, cream, or high-fat meats. Meal planning  Eat meals and snacks regularly, preferably at the same times every day. Avoid going long periods of time without eating.  Eat foods high in fiber, such as fresh fruits, vegetables, beans, and whole grains. Talk to your dietitian about how many servings of carbs you can eat at each meal.  Eat 4-6 ounces (oz) of lean protein each day, such as lean meat, chicken, fish, eggs, or tofu. One oz of lean protein is equal to: ? 1 oz of meat, chicken, or fish. ? 1 egg. ?  cup of tofu.  Eat some foods each day that contain healthy fats, such as avocado, nuts, seeds, and fish. Lifestyle  Check your blood glucose regularly.  Exercise regularly as told by your health care provider. This may include: ? 150 minutes of moderate-intensity or vigorous-intensity exercise each week. This could be brisk walking, biking, or water aerobics. ? Stretching and doing strength exercises, such as yoga or weightlifting, at least 2 times a week.  Take medicines as told by your  health care provider.  Do not use any products that contain nicotine or tobacco, such as cigarettes and e-cigarettes. If you need help quitting, ask your health care provider.  Work with a Social worker or diabetes educator to identify strategies to manage stress and any emotional and social challenges. Questions to ask a health care provider  Do I need to meet with a diabetes educator?  Do I need to meet with a dietitian?  What number can I call if I have questions?  When are the best times to check my blood glucose? Where to find more information:  American Diabetes Association: diabetes.org  Academy of Nutrition and Dietetics: www.eatright.[employer] of Diabetes and Digestive and Kidney Diseases (NIH): [employer] Summary  A healthy meal plan will help you control your blood glucose and maintain a healthy lifestyle.  Working with a diet and nutrition specialist (dietitian) can help you make a meal plan that is best for you.  Keep in mind that carbohydrates (carbs) and alcohol have immediate effects  on your blood glucose levels. It is important to count carbs and to use alcohol carefully. This information is not intended to replace advice given to you by your health care provider. Make sure you discuss any questions you have with your health care provider. Document Revised: 03/13/2017 Document Reviewed: 05/05/2016 Elsevier Patient Education  2020 [employer].

## 2019-06-24 ENCOUNTER — Other Ambulatory Visit: Payer: Self-pay | Admitting: Primary Care

## 2019-06-24 DIAGNOSIS — I10 Essential (primary) hypertension: Secondary | ICD-10-CM

## 2019-07-09 ENCOUNTER — Other Ambulatory Visit: Payer: Self-pay | Admitting: Oncology

## 2019-07-14 NOTE — Progress Notes (Signed)
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  INTERVAL HISTORY: Patient returns to clinic today for routine 46-monthevaluation.  She currently feels well and is asymptomatic she continues to tolerate letrozole and Fosamax without significant side effects. She has no neurologic complaints.  She denies any recent fevers or illnesses.  She denies any chest pain, shortness of breath, cough, or hemoptysis.  She denies any nausea, vomiting, constipation, or diarrhea. She has no urinary complaints.  Patient offers no specific complaints today.  REVIEW OF SYSTEMS:   Review of Systems  Constitutional: Negative.  Negative for fever, malaise/fatigue and weight loss.  HENT: Negative for sinus pain.   Respiratory: Negative.  Negative for cough and shortness of breath.   Cardiovascular: Negative.  Negative for chest pain and leg swelling.  Gastrointestinal: Negative.  Negative for abdominal pain, constipation and nausea.  Genitourinary: Negative for hematuria and urgency.  Musculoskeletal: Negative.   Skin: Negative.  Negative for rash.  Neurological: Negative.  Negative for sensory change, weakness and headaches.  Psychiatric/Behavioral: Negative.  The patient is not nervous/anxious and does not have insomnia.      As per HPI. Otherwise, a complete review of systems is negative.  PAST MEDICAL HISTORY: Past Medical History:  Diagnosis Date  . Arthritis   . Cancer ([facility] 03/13/2016   INVASIVE MAMMARY CARCINOMA  . Cataract  02/03/2018  . Chickenpox   . Complication of anesthesia   . Frequent UTI   . GERD (gastroesophageal reflux disease)    RARE  . Hypertension   . Personal history of chemotherapy 2017/2018   F/U left breast cancer  . Personal history of radiation therapy 2018   F/U left breast cancer  . PONV (postoperative nausea and vomiting)   . RSD (reflex sympathetic dystrophy)   . Seasonal allergies   . Skin cancer   . Urine incontinence     PAST SURGICAL HISTORY: Past Surgical History:  Procedure Laterality Date  . ABDOMINAL HYSTERECTOMY  2005   total/ Dr WDavis Gourd . BLADDER TUMOR EXCISION  2006  . BREAST BIOPSY Left 03/13/2016   INVASIVE MAMMARY CARCINOMA  . BREAST LUMPECTOMY Left 2018  . BREAST LUMPECTOMY WITH SENTINEL LYMPH NODE BIOPSY Left 07/14/2016   Procedure: BREAST LUMPECTOMY WITH SENTINEL LYMPH NODE BX;  Surgeon: JRobert Bellow MD;  Location: [facility] ORS;  Service: General;  Laterality: Left;  . denture surgery    . OOPHORECTOMY    . PORTACATH PLACEMENT N/A 04/01/2016   Procedure: INSERTION PORT-A-CATH;  Surgeon: JRobert Bellow MD;  Location: [facility] ORS;  Service: General;  Laterality: N/A;  . SKIN CANCER EXCISION Right 2015   foot/ Dr HNevada Crane . TUBAL LIGATION    . TUMOR EXCISION Right    FOOT    FAMILY HISTORY: Family History  Problem Relation Age of Onset  . Emphysema Father   . Heart disease Mother   . Transient ischemic attack Mother   . Breast cancer Cousin        paternal  . Kidney cancer Neg Hx   . Bladder Cancer Neg Hx  ADVANCED DIRECTIVES (Y/N):  N  HEALTH MAINTENANCE: Social History   Tobacco Use  . Smoking status: Former Smoker    Packs/day: 1.00    Years: 20.00    Pack years: 20.00    Types: Cigarettes    Quit date: 04/14/1994    Years since quitting: 25.2  . Smokeless tobacco: Never Used  Substance Use Topics  . Alcohol use: No    Alcohol/week: 0.0 standard drinks  . Drug use: No     Colonoscopy:  PAP:  Bone density:  Lipid panel:   Allergies  Allergen Reactions  . Bee Pollen Hives  . Mosquito (Diagnostic) Hives  . Other Other (See Comments)    Uncoded Allergy. Allergen: VICRYL  (90% glycolide and 10% L-lactide [polyglactin-- synthetic absorbable sterile surgical suture]), Other Reaction: Infection  . Gabapentin Swelling  . Norvasc [Amlodipine Besylate] Swelling    TO FEET  . Lisinopril Cough    Current Outpatient Medications  Medication Sig Dispense Refill  . acetaminophen (TYLENOL) 500 MG tablet Take 1,000 mg by mouth every 6 (six) hours as needed for moderate pain.     Marland Kitchen alendronate (FOSAMAX) 70 MG tablet Take 1 tablet (70 mg total) by mouth once a week. Take with a full glass of water on an empty stomach. 12 tablet 6  . Ascorbic Acid (VITAMIN C) 1000 MG tablet Take 1,000 mg by mouth daily.    Marland Kitchen atorvastatin (LIPITOR) 10 MG tablet TAKE 1 TABLET BY MOUTH ONCE A DAY FOR CHOLSTEROL. 90 tablet 3  . Calcium Carb-Cholecalciferol (CALCIUM/VITAMIN D PO) Take 1,200 mg by mouth.    . CRANBERRY PO Take 1 capsule by mouth 2 (two) times daily.    . hydrochlorothiazide (HYDRODIURIL) 25 MG tablet TAKE 1 TABLET BY MOUTH ONCE DAILY 90 tablet 1  . letrozole (FEMARA) 2.5 MG tablet TAKE 1 TABLET BY MOUTH ONCE DAILY 90 tablet 3  . losartan (COZAAR) 50 MG tablet TAKE 1 TABLET BY MOUTH ONCE DAILY 90 tablet 1  . Probiotic Product (PROBIOTIC PO) Take 1 tablet by mouth daily.     No current facility-administered medications for this visit.    OBJECTIVE: Vitals:   07/21/19 1028  BP: 120/78  Pulse: (!) 102  Resp: 17  Temp: 98.9 F (37.2 C)  SpO2: 97%     Body mass index is 23.74 kg/m.    ECOG FS:0 - Asymptomatic  General: Well-developed, well-nourished, no acute distress. Eyes: Pink conjunctiva, anicteric sclera. HEENT: Normocephalic, moist mucous membranes. Breast: Exam deferred today. Lungs: No audible wheezing or coughing. Heart: Regular rate and rhythm. Abdomen: Soft, nontender, no obvious distention.  Musculoskeletal: No edema, cyanosis, or clubbing. Neuro: Alert, answering all questions appropriately. Cranial nerves grossly intact. Skin: No rashes or petechiae noted. Psych: Normal affect.  LAB RESULTS:  Lab Results  Component Value Date   NA 139 02/09/2019   K 3.7 02/09/2019   CL 104 02/09/2019   CO2 26 02/09/2019   GLUCOSE 131 (H) 02/09/2019   BUN 28 (H) 02/09/2019   CREATININE 1.16 02/09/2019   CALCIUM 9.6 02/09/2019   PROT 7.3 02/09/2019   ALBUMIN 4.2 02/09/2019   AST 18 02/09/2019   ALT 22 02/09/2019   ALKPHOS 66 02/09/2019   BILITOT 0.5 02/09/2019   GFRNONAA 50 (L) 12/22/2016   GFRAA 58 (L) 12/22/2016    Lab Results  Component Value Date   WBC 7.1 02/09/2019   NEUTROABS 5.3 02/03/2018   HGB 12.0 02/09/2019   HCT 35.7 (L) 02/09/2019   MCV  90.4 02/09/2019   PLT 243.0 02/09/2019     STUDIES: No results found.  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  PLAN:    1. 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 She then underwent a lumpectomy on July 14, 2016 and completed adjuvant XRT.  Continue letrozole for a minimum of 5 years completing treatment in June 2023.  Her most recent mammogram on November 03, 2018 was reported as BI-RADS 2.  Repeat mammogram in early August along with bone mineral density.  Return to clinic in 6 months for routine evaluation. 2.  Decreased EF%: Patient had a pretreatment EF of 41%. Continue monitoring evaluation per cardiology. 3.  Osteopenia: Patient's most recent bone mineral density on November 18, 2018 reported T score of -2.2.  This is improved over 1 year prior where the T score was reported at -2.5.  Continue Fosamax, calcium, and vitamin D supplementation.  Repeat bone mineral density in August 2021  along with mammogram as above.     Patient expressed understanding and was in agreement with this plan. She also understands that She can call clinic at any time with any questions, concerns, or complaints.    Cancer Staging Malignant neoplasm of upper-outer quadrant of left female breast [facility]) Staging form: Breast, AJCC 7th Edition - Clinical stage from 03/19/2016: Stage IIA (T2, N0, M0) - Signed by Lloyd Huger, MD on 04/06/2016 - Pathologic stage from 07/27/2016: Stage IIA (T2, N0, cM0) - Signed by Lloyd Huger, MD on 07/27/2016    Lloyd Huger, MD 07/21/19 6:25 PM

## 2019-07-21 ENCOUNTER — Encounter: Payer: Self-pay | Admitting: Oncology

## 2019-07-21 ENCOUNTER — Other Ambulatory Visit: Payer: Self-pay

## 2019-07-21 ENCOUNTER — Inpatient Hospital Stay: Payer: PPO | Attending: Oncology | Admitting: Oncology

## 2019-07-21 VITALS — BP 120/78 | HR 102 | Temp 98.9°F | Resp 17 | Wt 151.6 lb

## 2019-07-21 DIAGNOSIS — Z79811 Long term (current) use of aromatase inhibitors: Secondary | ICD-10-CM | POA: Diagnosis not present

## 2019-07-21 DIAGNOSIS — C50412 Malignant neoplasm of upper-outer quadrant of left female breast: Secondary | ICD-10-CM | POA: Insufficient documentation

## 2019-07-21 DIAGNOSIS — Z9221 Personal history of antineoplastic chemotherapy: Secondary | ICD-10-CM | POA: Insufficient documentation

## 2019-07-21 DIAGNOSIS — Z923 Personal history of irradiation: Secondary | ICD-10-CM | POA: Diagnosis not present

## 2019-07-21 DIAGNOSIS — Z79899 Other long term (current) drug therapy: Secondary | ICD-10-CM | POA: Insufficient documentation

## 2019-07-21 DIAGNOSIS — K219 Gastro-esophageal reflux disease without esophagitis: Secondary | ICD-10-CM | POA: Diagnosis not present

## 2019-07-21 DIAGNOSIS — Z170 Estrogen receptor positive status [ER+]: Secondary | ICD-10-CM | POA: Diagnosis not present

## 2019-07-21 DIAGNOSIS — M8580 Other specified disorders of bone density and structure, unspecified site: Secondary | ICD-10-CM | POA: Insufficient documentation

## 2019-07-21 DIAGNOSIS — Z87891 Personal history of nicotine dependence: Secondary | ICD-10-CM | POA: Insufficient documentation

## 2019-07-21 DIAGNOSIS — Z90710 Acquired absence of both cervix and uterus: Secondary | ICD-10-CM | POA: Insufficient documentation

## 2019-07-21 DIAGNOSIS — Z85828 Personal history of other malignant neoplasm of skin: Secondary | ICD-10-CM | POA: Diagnosis not present

## 2019-07-21 DIAGNOSIS — Z8249 Family history of ischemic heart disease and other diseases of the circulatory system: Secondary | ICD-10-CM | POA: Diagnosis not present

## 2019-07-21 DIAGNOSIS — Z803 Family history of malignant neoplasm of breast: Secondary | ICD-10-CM | POA: Diagnosis not present

## 2019-07-21 DIAGNOSIS — Z90721 Acquired absence of ovaries, unilateral: Secondary | ICD-10-CM | POA: Diagnosis not present

## 2019-07-21 DIAGNOSIS — I10 Essential (primary) hypertension: Secondary | ICD-10-CM | POA: Insufficient documentation

## 2019-07-21 NOTE — Progress Notes (Signed)
Pt here for follow up. No complaints or concerns.  

## 2019-08-02 LAB — HM DIABETES EYE EXAM

## 2019-09-14 ENCOUNTER — Other Ambulatory Visit: Payer: Self-pay | Admitting: Oncology

## 2019-09-14 ENCOUNTER — Other Ambulatory Visit: Payer: Self-pay | Admitting: Primary Care

## 2019-09-14 DIAGNOSIS — I10 Essential (primary) hypertension: Secondary | ICD-10-CM

## 2019-09-27 ENCOUNTER — Other Ambulatory Visit: Payer: Self-pay | Admitting: General Surgery

## 2019-11-01 IMAGING — MG DIGITAL DIAGNOSTIC BILATERAL MAMMOGRAM WITH TOMO AND CAD
6 of 9 series · 6 of 25 positions shown · non-contrast
Comparison: Previous exam(s).

CLINICAL DATA: History of treated left breast cancer, status post
lumpectomy, chemo and radiation therapy in 2820.

EXAM:
DIGITAL DIAGNOSTIC BILATERAL MAMMOGRAM WITH CAD AND TOMO

[L MLO]
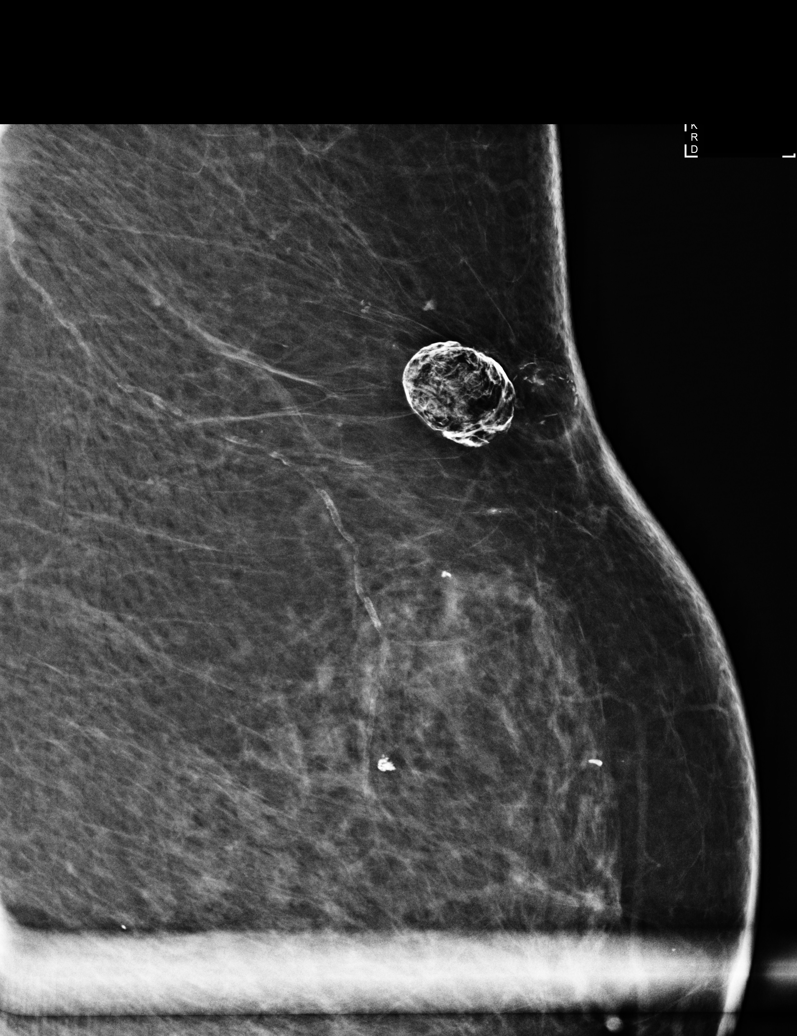

[R CC synth-2D]
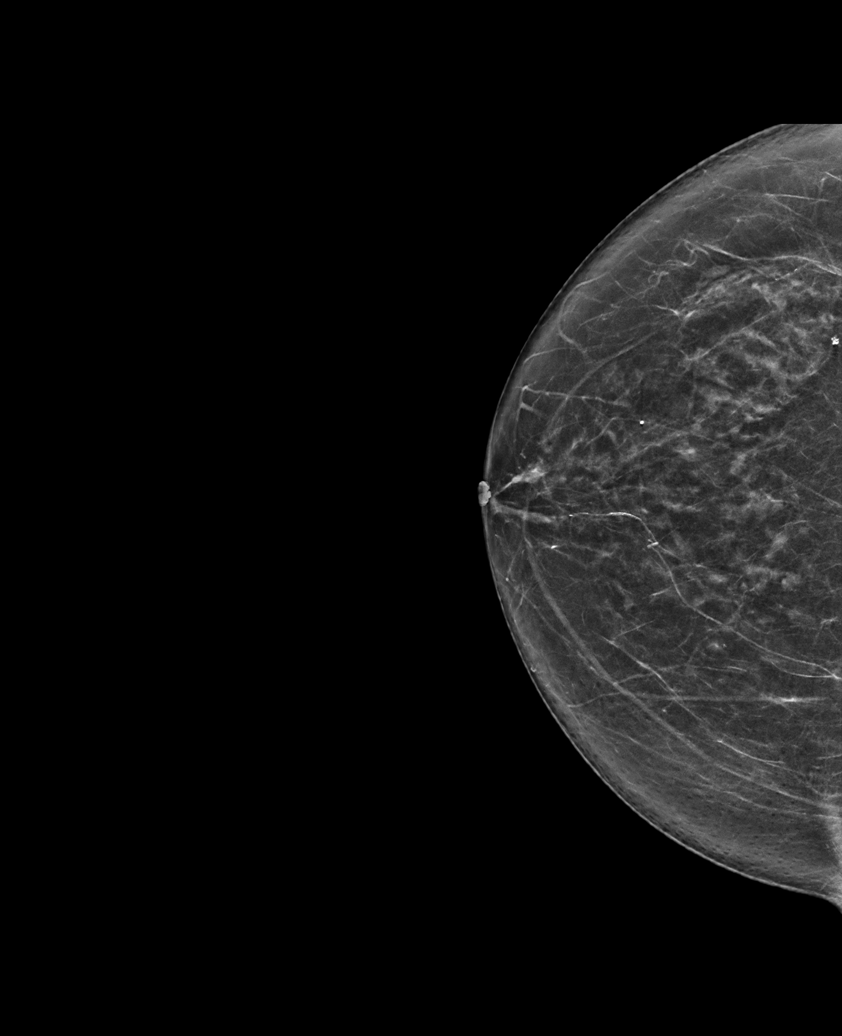

[L MLO synth-2D]
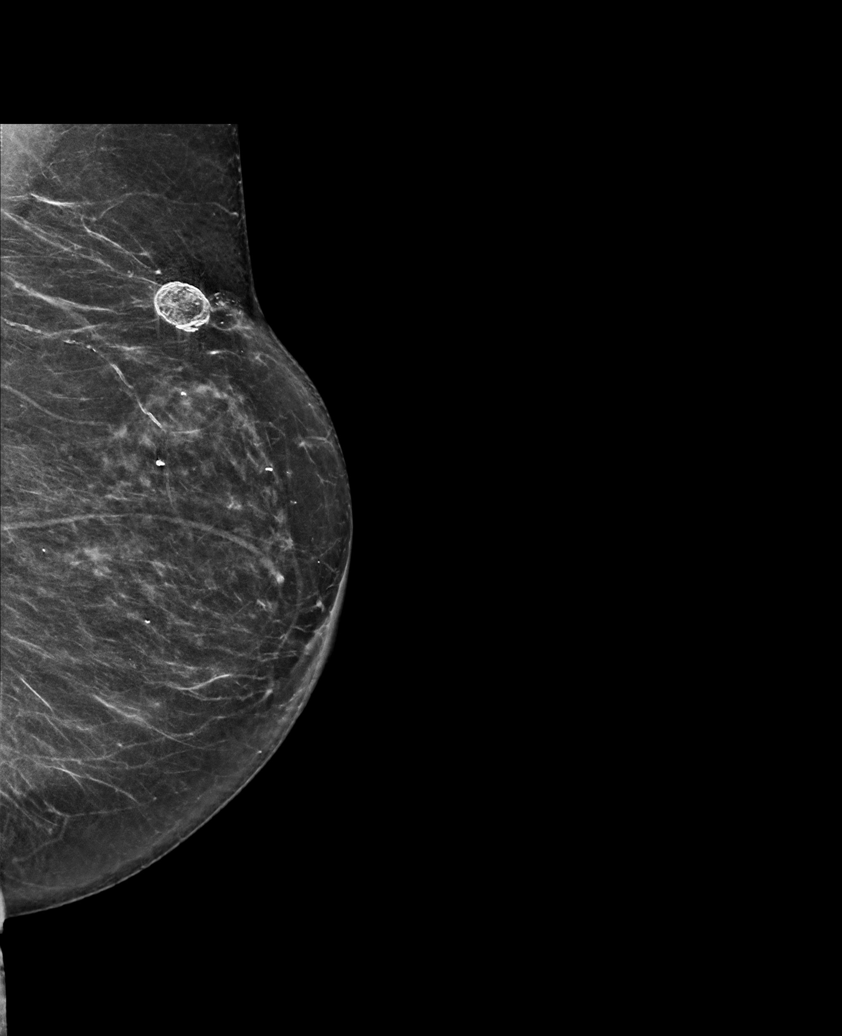

[L CC synth-2D]
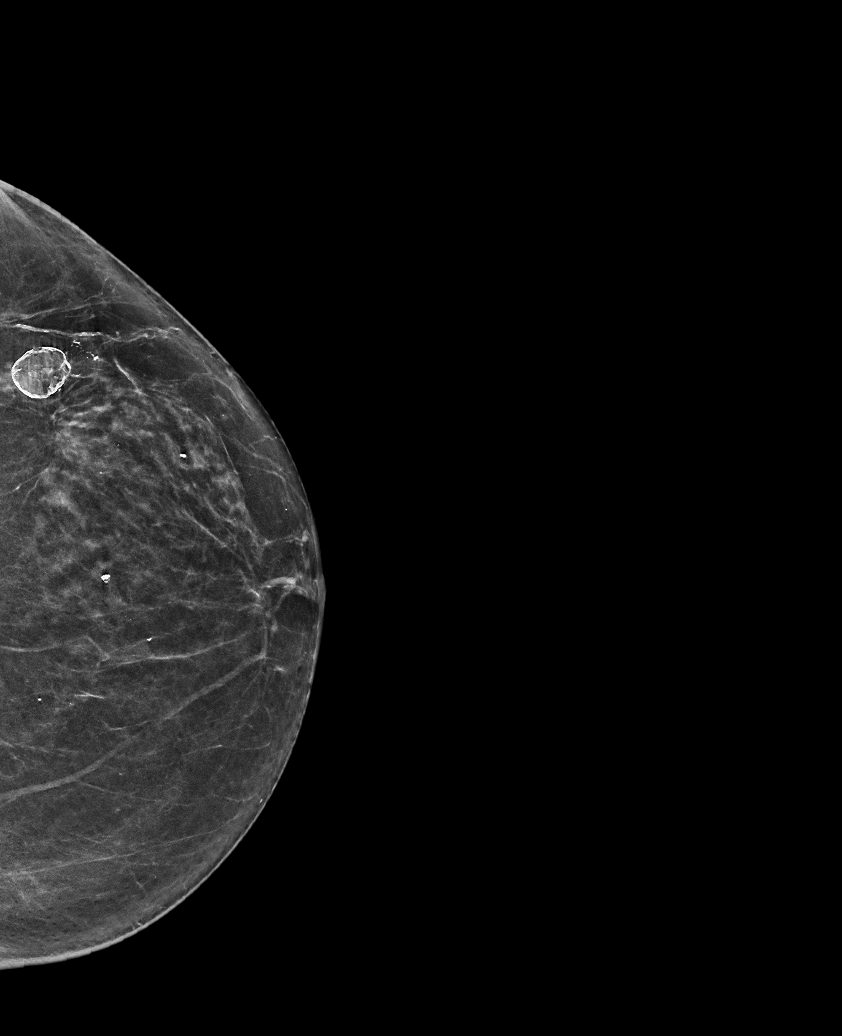

[R MLO synth-2D]
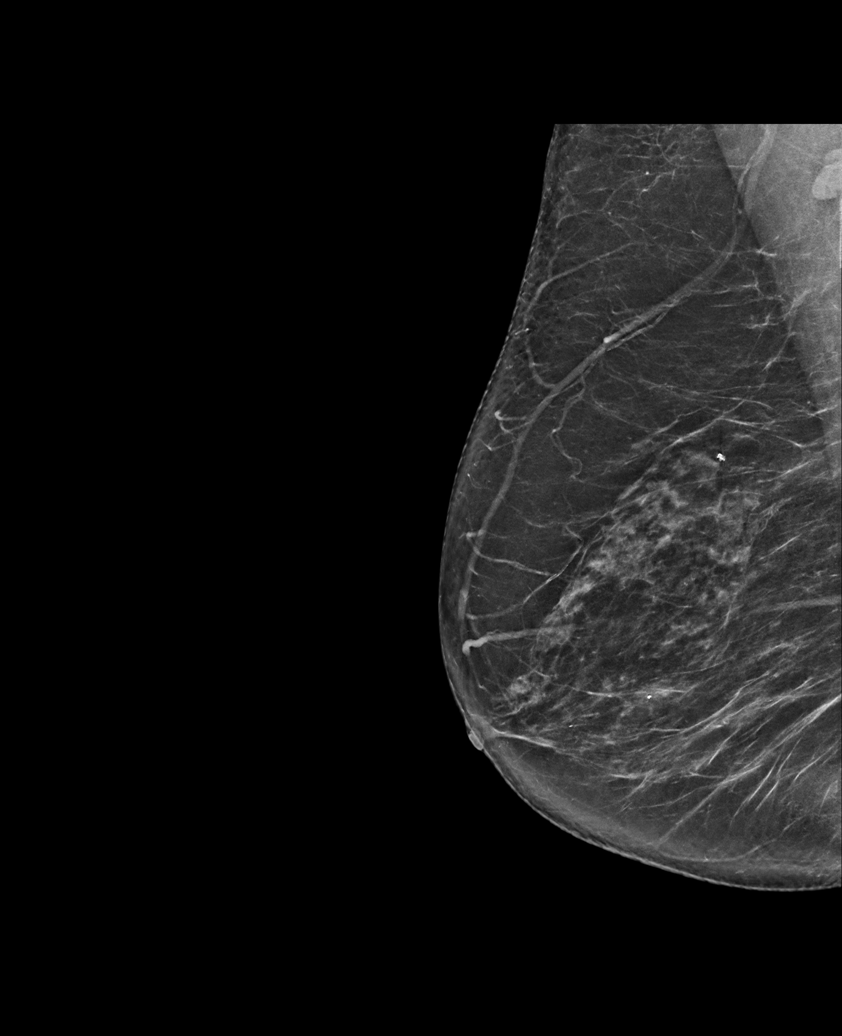

[L CC tomo · tomo slice 31/62.0]
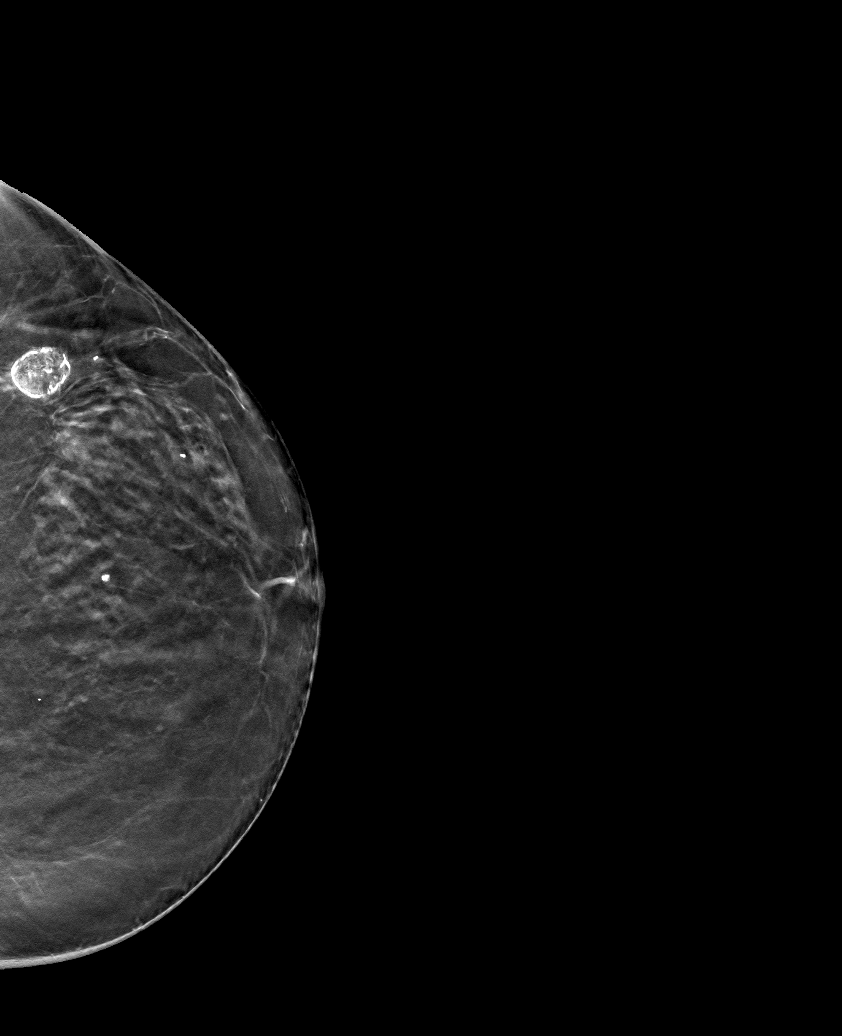

[6 of 25 positions shown; findings below may reference images not displayed]

ACR Breast Density Category b: There are scattered areas of
fibroglandular density.
FINDINGS: Mammographically, there are no suspicious masses, areas of
nonsurgical architectural distortion or microcalcifications in
either breast. Maturing post lumpectomy and posttreatment changes in
the left breast.

Mammographic images were processed with CAD.
IMPRESSION: No mammographic evidence of malignancy in either breast, status post
treated left breast cancer.

RECOMMENDATION:
Diagnostic mammogram is suggested in 1 year. (Code:G7-K-OCS)

I have discussed the findings and recommendations with the patient.
Results were also provided in writing at the conclusion of the
visit. If applicable, a reminder letter will be sent to the patient
regarding the next appointment.

BI-RADS CATEGORY  2: Benign.

## 2019-11-08 ENCOUNTER — Other Ambulatory Visit: Payer: Self-pay

## 2019-11-21 ENCOUNTER — Other Ambulatory Visit: Payer: Self-pay

## 2019-11-21 ENCOUNTER — Ambulatory Visit: Payer: PPO

## 2019-11-21 ENCOUNTER — Ambulatory Visit
Admission: RE | Admit: 2019-11-21 | Discharge: 2019-11-21 | Disposition: A | Discharge status: Home / Self Care | Payer: PPO | Source: Ambulatory Visit | Attending: Oncology | Admitting: Oncology

## 2019-11-21 DIAGNOSIS — Z9221 Personal history of antineoplastic chemotherapy: Secondary | ICD-10-CM | POA: Insufficient documentation

## 2019-11-21 DIAGNOSIS — M8580 Other specified disorders of bone density and structure, unspecified site: Secondary | ICD-10-CM | POA: Diagnosis not present

## 2019-11-21 DIAGNOSIS — Z13820 Encounter for screening for osteoporosis: Secondary | ICD-10-CM | POA: Diagnosis not present

## 2019-11-21 DIAGNOSIS — Z923 Personal history of irradiation: Secondary | ICD-10-CM | POA: Diagnosis not present

## 2019-11-21 DIAGNOSIS — M8589 Other specified disorders of bone density and structure, multiple sites: Secondary | ICD-10-CM | POA: Diagnosis not present

## 2019-11-21 DIAGNOSIS — Z853 Personal history of malignant neoplasm of breast: Secondary | ICD-10-CM | POA: Diagnosis not present

## 2019-11-21 DIAGNOSIS — Z780 Asymptomatic menopausal state: Secondary | ICD-10-CM | POA: Diagnosis not present

## 2019-11-21 DIAGNOSIS — Z801 Family history of malignant neoplasm of trachea, bronchus and lung: Secondary | ICD-10-CM | POA: Diagnosis not present

## 2019-11-21 DIAGNOSIS — R928 Other abnormal and inconclusive findings on diagnostic imaging of breast: Secondary | ICD-10-CM | POA: Diagnosis not present

## 2019-11-21 DIAGNOSIS — C50412 Malignant neoplasm of upper-outer quadrant of left female breast: Secondary | ICD-10-CM

## 2019-11-29 DIAGNOSIS — Z853 Personal history of malignant neoplasm of breast: Secondary | ICD-10-CM | POA: Diagnosis not present

## 2019-11-30 ENCOUNTER — Ambulatory Visit: Payer: PPO | Admitting: Surgery

## 2019-12-07 ENCOUNTER — Other Ambulatory Visit: Payer: Self-pay | Admitting: Primary Care

## 2019-12-07 DIAGNOSIS — I10 Essential (primary) hypertension: Secondary | ICD-10-CM

## 2020-01-19 NOTE — Progress Notes (Signed)
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  INTERVAL HISTORY: Patient returns to clinic today for routine 33-monthevaluation.  She currently feels well and is asymptomatic.  She is tolerating letrozole and Fosamax without significant side effects.  She has no neurologic complaints.  She denies any recent fevers or illnesses.  She denies any chest pain, shortness of breath, cough, or hemoptysis.  She denies any nausea, vomiting, constipation, or diarrhea. She has no urinary complaints.  Patient offers no specific complaints today.  REVIEW OF SYSTEMS:   Review of Systems  Constitutional: Negative.  Negative for fever, malaise/fatigue and weight loss.  HENT: Negative for sinus pain.   Respiratory: Negative.  Negative for cough and shortness of breath.   Cardiovascular: Negative.  Negative for chest pain and leg swelling.  Gastrointestinal: Negative.  Negative for abdominal pain, constipation and nausea.  Genitourinary: Negative for hematuria and urgency.  Musculoskeletal: Negative.   Skin: Negative.  Negative for rash.  Neurological: Negative.  Negative for sensory change, weakness and headaches.  Psychiatric/Behavioral: Negative.  The patient is not nervous/anxious and does not have insomnia.      As per HPI. Otherwise, a complete review of systems is negative.  PAST MEDICAL HISTORY: Past Medical History:  Diagnosis Date  . Arthritis   . [place]   INVASIVE MAMMARY CARCINOMA  . Cataract  02/03/2018  . Chickenpox   . Complication of anesthesia   . Frequent UTI   . GERD (gastroesophageal reflux disease)    RARE  . Hypertension   . Personal history of chemotherapy 2017/2018   F/U left breast cancer  . Personal history of radiation therapy 2018   F/U left breast cancer  . PONV (postoperative nausea and vomiting)   . RSD (reflex sympathetic dystrophy)   . Seasonal allergies   . Skin cancer   . Urine incontinence     PAST SURGICAL HISTORY: Past Surgical History:  Procedure Laterality Date  . ABDOMINAL HYSTERECTOMY  2005   total/ Dr WDavis Gourd . BLADDER TUMOR EXCISION  2006  . BREAST BIOPSY Left 03/13/2016   INVASIVE MAMMARY CARCINOMA  . BREAST LUMPECTOMY Left 2018  . BREAST LUMPECTOMY WITH SENTINEL LYMPH NODE BIOPSY Left 07/14/2016   Procedure: BREAST LUMPECTOMY WITH SENTINEL LYMPH NODE BX;  Surgeon: JRobert Bellow MD;  Location: [facility] ORS;  Service: General;  Laterality: Left;  . denture surgery    . OOPHORECTOMY    . PORTACATH PLACEMENT N/A 04/01/2016   Procedure: INSERTION PORT-A-CATH;  Surgeon: JRobert Bellow MD;  Location: [facility] ORS;  Service: General;  Laterality: N/A;  . SKIN CANCER EXCISION Right 2015   foot/ Dr HNevada Crane . TUBAL LIGATION    . TUMOR EXCISION Right    FOOT    FAMILY HISTORY: Family History  Problem Relation Age of Onset  . Emphysema Father   . Heart disease Mother   . Transient ischemic attack Mother   . Breast cancer Cousin        paternal  . Kidney cancer Neg Hx   . Bladder Cancer Neg Hx  ADVANCED DIRECTIVES (Y/N):  N  HEALTH MAINTENANCE: Social History   Tobacco Use  . Smoking status: Former Smoker    Packs/day: 1.00    Years: 20.00    Pack years: 20.00    Types: Cigarettes    Quit date: 04/14/1994    Years since quitting: 25.8  . Smokeless tobacco: Never Used  Vaping Use  . Vaping Use: Never used  Substance Use Topics  . Alcohol use: No    Alcohol/week: 0.0 standard drinks  . Drug use: No      Colonoscopy:  PAP:  Bone density:  Lipid panel:  Allergies  Allergen Reactions  . Bee Pollen Hives  . Mosquito (Diagnostic) Hives  . Other Other (See Comments)    Uncoded Allergy. Allergen: VICRYL  (90% glycolide and 10% L-lactide [polyglactin-- synthetic absorbable sterile surgical suture]), Other Reaction: Infection  . Gabapentin Swelling  . Mosquito (Culex Pipiens) Allergy Skin Test Hives  . Norvasc [Amlodipine Besylate] Swelling    TO FEET  . Lisinopril Cough    Current Outpatient Medications  Medication Sig Dispense Refill  . acetaminophen (TYLENOL) 500 MG tablet Take 1,000 mg by mouth every 6 (six) hours as needed for moderate pain.     Marland Kitchen alendronate (FOSAMAX) 70 MG tablet TAKE 1 TABLET BY MOUTH ONCE A WEEK. TAKEWITH A FULL GLASS OF WATER ON AN EMPTY STOMACH 12 tablet 6  . Ascorbic Acid (VITAMIN C) 1000 MG tablet Take 1,000 mg by mouth daily.    Marland Kitchen atorvastatin (LIPITOR) 10 MG tablet TAKE 1 TABLET BY MOUTH ONCE A DAY FOR CHOLSTEROL. 90 tablet 3  . Calcium Carb-Cholecalciferol (CALCIUM/VITAMIN D PO) Take 1,200 mg by mouth.    . CRANBERRY PO Take 1 capsule by mouth 2 (two) times daily.    . hydrochlorothiazide (HYDRODIURIL) 25 MG tablet TAKE 1 TABLET BY MOUTH ONCE DAILY 90 tablet 1  . letrozole (FEMARA) 2.5 MG tablet TAKE 1 TABLET BY MOUTH ONCE DAILY 90 tablet 3  . losartan (COZAAR) 50 MG tablet TAKE 1 TABLET BY MOUTH ONCE DAILY 90 tablet 1  . Probiotic Product (PROBIOTIC PO) Take 1 tablet by mouth daily.     No current facility-administered medications for this visit.    OBJECTIVE: Vitals:   01/25/20 1033  BP: (!) 151/95  Pulse: (!) 112  Resp: 16  Temp: (!) 97.2 F (36.2 C)  SpO2: 99%     Body mass index is 24.95 kg/m.    ECOG FS:0 - Asymptomatic  General: Well-developed, well-nourished, no acute distress. Eyes: Pink conjunctiva, anicteric sclera. HEENT: Normocephalic, moist mucous membranes. Breast: Exam performed by another provider earlier today. Lungs:  No audible wheezing or coughing. Heart: Regular rate and rhythm. Abdomen: Soft, nontender, no obvious distention. Musculoskeletal: No edema, cyanosis, or clubbing. Neuro: Alert, answering all questions appropriately. Cranial nerves grossly intact. Skin: No rashes or petechiae noted. Psych: Normal affect.   LAB RESULTS:  Lab Results  Component Value Date   NA 139 02/09/2019   K 3.7 02/09/2019   CL 104 02/09/2019   CO2 26 02/09/2019   GLUCOSE 131 (H) 02/09/2019   BUN 28 (H) 02/09/2019   CREATININE 1.16 02/09/2019   CALCIUM 9.6 02/09/2019   PROT 7.3 02/09/2019   ALBUMIN 4.2 02/09/2019   AST 18 02/09/2019   ALT 22 02/09/2019   ALKPHOS 66 02/09/2019   BILITOT 0.5 02/09/2019   GFRNONAA 50 (L) 12/22/2016   GFRAA 58 (L) 12/22/2016    Lab Results  Component Value Date   WBC  7.1 02/09/2019   NEUTROABS 5.3 02/03/2018   HGB 12.0 02/09/2019   HCT 35.7 (L) 02/09/2019   MCV 90.4 02/09/2019   PLT 243.0 02/09/2019     STUDIES: No results found.  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  PLAN:    1. 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 She then underwent a lumpectomy on July 14, 2016 and completed adjuvant XRT.  Continue letrozole for a minimum of 5 years completing treatment in June 2023.  Her most recent mammogram on November 21, 2019 was reported as BI-RADS 2.  Repeat in August 2022.  Return to clinic in 6 months for routine evaluation.   2.  Decreased EF%: Patient had a pretreatment EF of 41%. Continue monitoring evaluation per cardiology. 3.  Osteopenia: Patient's most recent bone mineral density on November 21, 2019 reported T score of -1.8 which reveals continued improvement over the past several years where her T score has decreased from  -2.5 to -2.2 to -1.8.  She has been instructed to complete her current prescription of Fosamax and then discontinue.  Continue calcium and vitamin D supplementation.  Repeat bone mineral density in August 2022.     Patient expressed understanding and was in agreement with this plan. She also understands that She can call clinic at any time with any questions, concerns, or complaints.    Cancer Staging Malignant neoplasm of upper-outer quadrant of left female breast [facility]) Staging form: Breast, AJCC 7th Edition - Clinical stage from 03/19/2016: Stage IIA (T2, N0, M0) - Signed by Lloyd Huger, MD on 04/06/2016 - Pathologic stage from 07/27/2016: Stage IIA (T2, N0, cM0) - Signed by Lloyd Huger, MD on 07/27/2016    Lloyd Huger, MD 01/25/20 11:52 AM

## 2020-01-24 ENCOUNTER — Encounter: Payer: Self-pay | Admitting: Oncology

## 2020-01-25 ENCOUNTER — Inpatient Hospital Stay: Payer: PPO | Attending: Oncology | Admitting: Oncology

## 2020-01-25 ENCOUNTER — Ambulatory Visit
Admission: RE | Admit: 2020-01-25 | Discharge: 2020-01-25 | Disposition: A | Discharge status: Home / Self Care | Payer: PPO | Source: Ambulatory Visit | Attending: Radiation Oncology | Admitting: Radiation Oncology

## 2020-01-25 ENCOUNTER — Encounter: Payer: Self-pay | Admitting: Radiation Oncology

## 2020-01-25 ENCOUNTER — Other Ambulatory Visit: Payer: Self-pay

## 2020-01-25 VITALS — BP 151/95 | HR 112 | Temp 97.2°F | Resp 16 | Wt 159.3 lb

## 2020-01-25 VITALS — BP 151/95 | HR 112 | Temp 97.2°F | Wt 159.2 lb

## 2020-01-25 DIAGNOSIS — Z90722 Acquired absence of ovaries, bilateral: Secondary | ICD-10-CM | POA: Insufficient documentation

## 2020-01-25 DIAGNOSIS — Z923 Personal history of irradiation: Secondary | ICD-10-CM | POA: Insufficient documentation

## 2020-01-25 DIAGNOSIS — Z79811 Long term (current) use of aromatase inhibitors: Secondary | ICD-10-CM | POA: Insufficient documentation

## 2020-01-25 DIAGNOSIS — Z170 Estrogen receptor positive status [ER+]: Secondary | ICD-10-CM | POA: Diagnosis not present

## 2020-01-25 DIAGNOSIS — Z8249 Family history of ischemic heart disease and other diseases of the circulatory system: Secondary | ICD-10-CM | POA: Insufficient documentation

## 2020-01-25 DIAGNOSIS — Z803 Family history of malignant neoplasm of breast: Secondary | ICD-10-CM | POA: Diagnosis not present

## 2020-01-25 DIAGNOSIS — M8580 Other specified disorders of bone density and structure, unspecified site: Secondary | ICD-10-CM | POA: Diagnosis not present

## 2020-01-25 DIAGNOSIS — Z85828 Personal history of other malignant neoplasm of skin: Secondary | ICD-10-CM | POA: Diagnosis not present

## 2020-01-25 DIAGNOSIS — I10 Essential (primary) hypertension: Secondary | ICD-10-CM | POA: Diagnosis not present

## 2020-01-25 DIAGNOSIS — Z79899 Other long term (current) drug therapy: Secondary | ICD-10-CM | POA: Diagnosis not present

## 2020-01-25 DIAGNOSIS — Z87891 Personal history of nicotine dependence: Secondary | ICD-10-CM | POA: Insufficient documentation

## 2020-01-25 DIAGNOSIS — C50412 Malignant neoplasm of upper-outer quadrant of left female breast: Secondary | ICD-10-CM | POA: Diagnosis not present

## 2020-01-25 DIAGNOSIS — Z90710 Acquired absence of both cervix and uterus: Secondary | ICD-10-CM | POA: Diagnosis not present

## 2020-01-25 DIAGNOSIS — Z9079 Acquired absence of other genital organ(s): Secondary | ICD-10-CM | POA: Insufficient documentation

## 2020-01-25 DIAGNOSIS — K219 Gastro-esophageal reflux disease without esophagitis: Secondary | ICD-10-CM | POA: Insufficient documentation

## 2020-01-25 NOTE — Progress Notes (Signed)
Radiation Oncology Follow up Note  Name: Catherine Tate   Date:   01/25/2020 MRN:  [id number] DOB: [birth year]    This 79 y.o. female presents to the clinic today for 3-year follow-up status post whole breast radiation to her left breast for stage IIa invasive mammary carcinoma ER/PR positive.  REFERRING PROVIDER: [name], NP  HPI: Patient is a 79 year old female now out 3 years having completed whole breast radiation to her left breast for stage IIa ER/PR positive invasive mammary carcinoma.  Seen today in routine follow-up she is doing well.  She specifically denies breast tenderness cough or bone pain..  She had mammograms back in August which were BI-RADS 2 benign which I have reviewed.  Also had bone density study and is being followed for osteoporosis.  She is currently on Fosamax as well as Femara which she is tolerating well  COMPLICATIONS OF TREATMENT: none  FOLLOW UP COMPLIANCE: keeps appointments   PHYSICAL EXAM:  BP (!) 151/95   Pulse (!) 112   Temp (!) 97.2 F (36.2 C) (Tympanic)   Wt 159 lb 3.2 oz (72.2 kg)   BMI 24.93 kg/m  Lungs are clear to A&P cardiac examination essentially unremarkable with regular rate and rhythm. No dominant mass or nodularity is noted in either breast in 2 positions examined. Incision is well-healed. No axillary or supraclavicular adenopathy is appreciated. Cosmetic result is excellent.  Patient does have an area of slight erythema in scar where her Port-A-Cath was placed.  Well-developed well-nourished patient in NAD. HEENT reveals PERLA, EOMI, discs not visualized.  Oral cavity is clear. No oral mucosal lesions are identified. Neck is clear without evidence of cervical or supraclavicular adenopathy. Lungs are clear to A&P. Cardiac examination is essentially unremarkable with regular rate and rhythm without murmur rub or thrill. Abdomen is benign with no organomegaly or masses noted. Motor sensory and DTR levels are equal and symmetric in  the upper and lower extremities. Cranial nerves II through XII are grossly intact. Proprioception is intact. No peripheral adenopathy or edema is identified. No motor or sensory levels are noted. Crude visual fields are within normal range.  RADIOLOGY RESULTS: Mammogram reviewed compatible with above-stated findings  PLAN: Present time patient is out over 3 years.  She continues close follow-up care with her surgeon and medical oncologist.  I am going to discontinue follow-up care at this time.  Be happy to reevaluate the patient anytime should further treatment be indicated.  I would like to take this opportunity to thank you for allowing me to participate in the care of your patient.Noreene Filbert, MD

## 2020-02-09 ENCOUNTER — Other Ambulatory Visit: Payer: Self-pay | Admitting: Primary Care

## 2020-02-09 DIAGNOSIS — E785 Hyperlipidemia, unspecified: Secondary | ICD-10-CM

## 2020-02-09 DIAGNOSIS — I10 Essential (primary) hypertension: Secondary | ICD-10-CM

## 2020-02-09 DIAGNOSIS — E119 Type 2 diabetes mellitus without complications: Secondary | ICD-10-CM

## 2020-02-17 ENCOUNTER — Other Ambulatory Visit: Payer: Self-pay

## 2020-02-17 ENCOUNTER — Other Ambulatory Visit (INDEPENDENT_AMBULATORY_CARE_PROVIDER_SITE_OTHER): Payer: PPO

## 2020-02-17 ENCOUNTER — Ambulatory Visit (INDEPENDENT_AMBULATORY_CARE_PROVIDER_SITE_OTHER): Payer: PPO

## 2020-02-17 DIAGNOSIS — E119 Type 2 diabetes mellitus without complications: Secondary | ICD-10-CM | POA: Diagnosis not present

## 2020-02-17 DIAGNOSIS — E785 Hyperlipidemia, unspecified: Secondary | ICD-10-CM | POA: Diagnosis not present

## 2020-02-17 DIAGNOSIS — Z0000 Encounter for general adult medical examination without abnormal findings: Secondary | ICD-10-CM | POA: Diagnosis not present

## 2020-02-17 DIAGNOSIS — I10 Essential (primary) hypertension: Secondary | ICD-10-CM | POA: Diagnosis not present

## 2020-02-17 LAB — COMPREHENSIVE METABOLIC PANEL
ALT: 18 U/L (ref 0–35)
AST: 20 U/L (ref 0–37)
Albumin: 4.3 g/dL (ref 3.5–5.2)
Alkaline Phosphatase: 45 U/L (ref 39–117)
BUN: 16 mg/dL (ref 6–23)
CO2: 27 mEq/L (ref 19–32)
Calcium: 9.3 mg/dL (ref 8.4–10.5)
Chloride: 101 mEq/L (ref 96–112)
Creatinine, Ser: 1.12 mg/dL (ref 0.40–1.20)
GFR: 47.25 mL/min — ABNORMAL LOW (ref >60.00)
Glucose, Bld: 111 mg/dL — ABNORMAL HIGH (ref 70–99)
Potassium: 3.8 mEq/L (ref 3.5–5.1)
Sodium: 137 mEq/L (ref 135–145)
Total Bilirubin: 0.7 mg/dL (ref 0.2–1.2)
Total Protein: 7.1 g/dL (ref 6.0–8.3)

## 2020-02-17 LAB — LIPID PANEL
Cholesterol: 125 mg/dL (ref 0–200)
HDL: 43.2 mg/dL (ref >39.00)
LDL Cholesterol: 58 mg/dL (ref 0–99)
NonHDL: 82.19
Total CHOL/HDL Ratio: 3
Triglycerides: 119 mg/dL (ref 0.0–149.0)
VLDL: 23.8 mg/dL (ref 0.0–40.0)

## 2020-02-17 LAB — HEMOGLOBIN A1C: Hgb A1c MFr Bld: 6.5 % (ref 4.6–6.5)

## 2020-02-17 NOTE — Patient Instructions (Signed)
Catherine Tate , Thank you for taking time to come for your Medicare Wellness Visit. I appreciate your ongoing commitment to your health goals. Please review the following plan we discussed and let me know if I can assist you in the future.   Screening recommendations/referrals: Colonoscopy: no longer required Mammogram: Up to date, completed 11/21/2019, due 11/2020 Bone Density: Up to date, completed 11/21/2019, due 11/2021 Recommended yearly ophthalmology/optometry visit for glaucoma screening and checkup Recommended yearly dental visit for hygiene and checkup  Vaccinations: Influenza vaccine: Up to date, completed 01/12/2020, due 11/2020 Pneumococcal vaccine: Completed series Tdap vaccine: Up to date, completed 09/10/2010, due 08/2020 Shingles vaccine: due, check with your insurance regarding coverage if interested    Covid-19:Completed series  Advanced directives: copy in chart  Conditions/risks identified: diabetes, hypertension  Next appointment: Follow up in one year for your annual wellness visit    Preventive Care 32 Years and Older, Female Preventive care refers to lifestyle choices and visits with your health care provider that can promote health and wellness. What does preventive care include?  A yearly physical exam. This is also called an annual well check.  Dental exams once or twice a year.  Routine eye exams. Ask your health care provider how often you should have your eyes checked.  Personal lifestyle choices, including:  Daily care of your teeth and gums.  Regular physical activity.  Eating a healthy diet.  Avoiding tobacco and drug use.  Limiting alcohol use.  Practicing safe sex.  Taking low-dose aspirin every day.  Taking vitamin and mineral supplements as recommended by your health care provider. What happens during an annual well check? The services and screenings done by your health care provider during your annual well check will depend on your age,  overall health, lifestyle risk factors, and family history of disease. Counseling  Your health care provider may ask you questions about your:  Alcohol use.  Tobacco use.  Drug use.  Emotional well-being.  Home and relationship well-being.  Sexual activity.  Eating habits.  History of falls.  Memory and ability to understand (cognition).  Work and work Statistician.  Reproductive health. Screening  You may have the following tests or measurements:  Height, weight, and BMI.  Blood pressure.  Lipid and cholesterol levels. These may be checked every 5 years, or more frequently if you are over 97 years old.  Skin check.  Lung cancer screening. You may have this screening every year starting at age 106 if you have a 30-pack-year history of smoking and currently smoke or have quit within the past 15 years.  Fecal occult blood test (FOBT) of the stool. You may have this test every year starting at age 59.  Flexible sigmoidoscopy or colonoscopy. You may have a sigmoidoscopy every 5 years or a colonoscopy every 10 years starting at age 39.  Hepatitis C blood test.  Hepatitis B blood test.  Sexually transmitted disease (STD) testing.  Diabetes screening. This is done by checking your blood sugar (glucose) after you have not eaten for a while (fasting). You may have this done every 1-3 years.  Bone density scan. This is done to screen for osteoporosis. You may have this done starting at age 4.  Mammogram. This may be done every 1-2 years. Talk to your health care provider about how often you should have regular mammograms. Talk with your health care provider about your test results, treatment options, and if necessary, the need for more tests. Vaccines  Your health  care provider may recommend certain vaccines, such as:  Influenza vaccine. This is recommended every year.  Tetanus, diphtheria, and acellular pertussis (Tdap, Td) vaccine. You may need a Td booster every 10  years.  Zoster vaccine. You may need this after age 64.  Pneumococcal 13-valent conjugate (PCV13) vaccine. One dose is recommended after age 46.  Pneumococcal polysaccharide (PPSV23) vaccine. One dose is recommended after age 76. Talk to your health care provider about which screenings and vaccines you need and how often you need them. This information is not intended to replace advice given to you by your health care provider. Make sure you discuss any questions you have with your health care provider. Document Released: 04/27/2015 Document Revised: 12/19/2015 Document Reviewed: 01/30/2015 Elsevier Interactive Patient Education  2017 [place] Prevention in the Home Falls can cause injuries. They can happen to people of all ages. There are many things you can do to make your home safe and to help prevent falls. What can I do on the outside of my home?  Regularly fix the edges of walkways and driveways and fix any cracks.  Remove anything that might make you trip as you walk through a door, such as a raised step or threshold.  Trim any bushes or trees on the path to your home.  Use bright outdoor lighting.  Clear any walking paths of anything that might make someone trip, such as rocks or tools.  Regularly check to see if handrails are loose or broken. Make sure that both sides of any steps have handrails.  Any raised decks and porches should have guardrails on the edges.  Have any leaves, snow, or ice cleared regularly.  Use sand or salt on walking paths during winter.  Clean up any spills in your garage right away. This includes oil or grease spills. What can I do in the bathroom?  Use night lights.  Install grab bars by the toilet and in the tub and shower. Do not use towel bars as grab bars.  Use non-skid mats or decals in the tub or shower.  If you need to sit down in the shower, use a plastic, non-slip stool.  Keep the floor dry. Clean up any water that  spills on the floor as soon as it happens.  Remove soap buildup in the tub or shower regularly.  Attach bath mats securely with double-sided non-slip rug tape.  Do not have throw rugs and other things on the floor that can make you trip. What can I do in the bedroom?  Use night lights.  Make sure that you have a light by your bed that is easy to reach.  Do not use any sheets or blankets that are too big for your bed. They should not hang down onto the floor.  Have a firm chair that has side arms. You can use this for support while you get dressed.  Do not have throw rugs and other things on the floor that can make you trip. What can I do in the kitchen?  Clean up any spills right away.  Avoid walking on wet floors.  Keep items that you use a lot in easy-to-reach places.  If you need to reach something above you, use a strong step stool that has a grab bar.  Keep electrical cords out of the way.  Do not use floor polish or wax that makes floors slippery. If you must use wax, use non-skid floor wax.  Do not have  throw rugs and other things on the floor that can make you trip. What can I do with my stairs?  Do not leave any items on the stairs.  Make sure that there are handrails on both sides of the stairs and use them. Fix handrails that are broken or loose. Make sure that handrails are as long as the stairways.  Check any carpeting to make sure that it is firmly attached to the stairs. Fix any carpet that is loose or worn.  Avoid having throw rugs at the top or bottom of the stairs. If you do have throw rugs, attach them to the floor with carpet tape.  Make sure that you have a light switch at the top of the stairs and the bottom of the stairs. If you do not have them, ask someone to add them for you. What else can I do to help prevent falls?  Wear shoes that:  Do not have high heels.  Have rubber bottoms.  Are comfortable and fit you well.  Are closed at the  toe. Do not wear sandals.  If you use a stepladder:  Make sure that it is fully opened. Do not climb a closed stepladder.  Make sure that both sides of the stepladder are locked into place.  Ask someone to hold it for you, if possible.  Clearly mark and make sure that you can see:  Any grab bars or handrails.  First and last steps.  Where the edge of each step is.  Use tools that help you move around (mobility aids) if they are needed. These include:  Canes.  Walkers.  Scooters.  Crutches.  Turn on the lights when you go into a dark area. Replace any light bulbs as soon as they burn out.  Set up your furniture so you have a clear path. Avoid moving your furniture around.  If any of your floors are uneven, fix them.  If there are any pets around you, be aware of where they are.  Review your medicines with your doctor. Some medicines can make you feel dizzy. This can increase your chance of falling. Ask your doctor what other things that you can do to help prevent falls. This information is not intended to replace advice given to you by your health care provider. Make sure you discuss any questions you have with your health care provider. Document Released: 01/25/2009 Document Revised: 09/06/2015 Document Reviewed: 05/05/2014 Elsevier Interactive Patient Education  2017 [employer].

## 2020-02-17 NOTE — Progress Notes (Addendum)
Subjective:   Catherine Tate is a 79 y.o. female who presents for Medicare Annual (Subsequent) preventive examination.  Review of Systems: N/A      I connected with the patient today by telephone and verified that I am speaking with the correct person using two identifiers. Location patient: home Location nurse: work Persons participating in the telephone visit: patient, nurse.   I discussed the limitations, risks, security and privacy concerns of performing an evaluation and management service by telephone and the availability of in person appointments. I also discussed with the patient that there may be a patient responsible charge related to this service. The patient expressed understanding and verbally consented to this telephonic visit.        Cardiac Risk Factors include: advanced age (>3men, >75 women);diabetes mellitus;hypertension     Objective:    Today's Vitals   There is no height or weight on file to calculate BMI.  Advanced Directives 02/17/2020 01/24/2020 07/21/2019 02/09/2019 01/19/2019 01/18/2019 07/12/2018  Does Patient Have a Medical Advance Directive? Yes Yes Yes Yes Yes Yes Yes  Type of Paramedic of [place];Living will [place];Living will [place];Living will [place];Living will [place];Living will [place];Living will [place];Living will  Does patient want to make changes to medical advance directive? - Yes (ED - Information included in AVS) No - Patient declined - - No - Patient declined No - Patient declined  Copy of Talent in Chart? Yes - validated most recent copy scanned in chart (See row information) - Yes - validated most recent copy scanned in chart (See row information) Yes - validated most recent copy scanned in chart (See row information) Yes - validated most recent copy scanned in chart  (See row information) No - copy requested -  Would patient like information on creating a medical advance directive? - Yes (ED - Information included in AVS) No - Patient declined - No - Patient declined No - Patient declined -    Current Medications (verified) Outpatient Encounter Medications as of 02/17/2020  Medication Sig  . acetaminophen (TYLENOL) 500 MG tablet Take 1,000 mg by mouth every 6 (six) hours as needed for moderate pain.   . Ascorbic Acid (VITAMIN C) 1000 MG tablet Take 1,000 mg by mouth daily.  Marland Kitchen atorvastatin (LIPITOR) 10 MG tablet TAKE 1 TABLET BY MOUTH ONCE A DAY FOR CHOLSTEROL.  . Calcium Carb-Cholecalciferol (CALCIUM/VITAMIN D PO) Take 1,200 mg by mouth.  . CRANBERRY PO Take 1 capsule by mouth 2 (two) times daily.  . hydrochlorothiazide (HYDRODIURIL) 25 MG tablet TAKE 1 TABLET BY MOUTH ONCE DAILY  . letrozole (FEMARA) 2.5 MG tablet TAKE 1 TABLET BY MOUTH ONCE DAILY  . losartan (COZAAR) 50 MG tablet TAKE 1 TABLET BY MOUTH ONCE DAILY  . Probiotic Product (PROBIOTIC PO) Take 1 tablet by mouth daily.  Marland Kitchen alendronate (FOSAMAX) 70 MG tablet TAKE 1 TABLET BY MOUTH ONCE A WEEK. TAKEWITH A FULL GLASS OF WATER ON AN EMPTY STOMACH (Patient not taking: Reported on 02/17/2020)   No facility-administered encounter medications on file as of 02/17/2020.    Allergies (verified) Bee pollen, Mosquito (diagnostic), Other, Gabapentin, Mosquito (culex pipiens) allergy skin test, Norvasc [amlodipine besylate], and Lisinopril   History: Past Medical History:  Diagnosis Date  . Arthritis   . 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 Cataract 02/03/2018  . Chickenpox   .  Complication of anesthesia   . Frequent UTI   . GERD (gastroesophageal reflux disease)    RARE  . Hypertension   . Personal history of chemotherapy 2017/2018   F/U left breast cancer  . Personal history of radiation therapy 2018   F/U left breast cancer  . PONV (postoperative nausea and vomiting)   .  RSD (reflex sympathetic dystrophy)   . Seasonal allergies   . Skin cancer   . Urine incontinence    Past Surgical History:  Procedure Laterality Date  . ABDOMINAL HYSTERECTOMY  2005   total/ Dr Davis Gourd  . BLADDER TUMOR EXCISION  2006  . BREAST BIOPSY Left 03/13/2016   INVASIVE MAMMARY CARCINOMA  . BREAST LUMPECTOMY Left 2018  . BREAST LUMPECTOMY WITH SENTINEL LYMPH NODE BIOPSY Left 07/14/2016   Procedure: BREAST LUMPECTOMY WITH SENTINEL LYMPH NODE BX;  Surgeon: [name], MD;  Location: [facility] ORS;  Service: General;  Laterality: Left;  . denture surgery    . OOPHORECTOMY    . PORTACATH PLACEMENT N/A 04/01/2016   Procedure: INSERTION PORT-A-CATH;  Surgeon: [name], MD;  Location: [facility] ORS;  Service: General;  Laterality: N/A;  . SKIN CANCER EXCISION Right 2015   foot/ Dr [place] Crane  . TUBAL LIGATION    . TUMOR EXCISION Right    FOOT   Family History  Problem Relation Age of Onset  . Emphysema Father   . Heart disease Mother   . Transient ischemic attack Mother   . Breast cancer Cousin        paternal  . Kidney cancer Neg Hx   . Bladder Cancer Neg Hx    Social History   Socioeconomic History  . Marital status: Single    Spouse name: Not on file  . Number of children: Not on file  . Years of education: Not on file  . Highest education level: Not on file  Occupational History  . Not on file  Tobacco Use  . Smoking status: Former Smoker    Packs/day: 1.00    Years: 20.00    Pack years: 20.00    Types: Cigarettes    Quit date: 04/14/1994    Years since quitting: 25.8  . Smokeless tobacco: Never Used  Vaping Use  . Vaping Use: Never used  Substance and Sexual Activity  . Alcohol use: No    Alcohol/week: 0.0 standard drinks  . Drug use: No  . Sexual activity: Not Currently  Other Topics Concern  . Not on file  Social History Narrative   Single.   2 children, 1 grandchild.   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   Enjoys making jewelry, puzzle  books, sewing, traveling.   Social Determinants of Health   Financial Resource Strain: Low Risk   . Difficulty of Paying Living Expenses: Not hard at all  Food Insecurity: No Food Insecurity  . Worried About Charity fundraiser in the Last Year: Never true  . Ran Out of Food in the Last Year: Never true  Transportation Needs: No Transportation Needs  . Lack of Transportation (Medical): No  . Lack of Transportation (Non-Medical): No  Physical Activity: Insufficiently Active  . Days of Exercise per Week: 3 days  . Minutes of Exercise per Session: 30 min  Stress: No Stress Concern Present  . Feeling of Stress : Not at all  Social Connections:   . Frequency of Communication with Friends and Family: Not on file  . Frequency of Social Gatherings with  Friends and Family: Not on file  . Attends Religious Services: Not on file  . Active Member of Clubs or Organizations: Not on file  . Attends Archivist Meetings: Not on file  . Marital Status: Not on file    Tobacco Counseling Counseling given: Not Answered   Clinical Intake:  Pre-visit preparation completed: Yes  Pain : No/denies pain     Nutritional Risks: None Diabetes: Yes CBG done?: No Did pt. bring in CBG monitor from home?: No  How often do you need to have someone help you when you read instructions, pamphlets, or other written materials from your doctor or pharmacy?: 1 - Never What is the last grade level you completed in school?: 12th  Diabetic: Yes Nutrition Risk Assessment:  Has the patient had any N/V/D within the last 2 months?  No  Does the patient have any non-healing wounds?  No  Has the patient had any unintentional weight loss or weight gain?  No   Diabetes:  Is the patient diabetic?  Yes  If diabetic, was a CBG obtained today?  No Did the patient bring in their glucometer from home?  N/A, telephone visit  How often do you monitor your CBG's? never.   Financial Strains and Diabetes  Management:  Are you having any financial strains with the device, your supplies or your medication? No .  Does the patient want to be seen by Chronic Care Management for management of their diabetes?  No  Would the patient like to be referred to a Nutritionist or for Diabetic Management?  No   Diabetic Exams:  Diabetic Eye Exam: Completed 07/14/2019 Diabetic Foot Exam: Completed 05/19/2019   Interpreter Needed?: No  Information entered by :: CJohnson, LPN   Activities of Daily Living In your present state of health, do you have any difficulty performing the following activities: 02/17/2020  Hearing? N  Vision? N  Difficulty concentrating or making decisions? N  Walking or climbing stairs? N  Dressing or bathing? N  Doing errands, shopping? N  Preparing Food and eating ? N  Using the Toilet? N  In the past six months, have you accidently leaked urine? Y  Comment wears depends  Do you have problems with loss of bowel control? N  Managing your Medications? N  Managing your Finances? N  Housekeeping or managing your Housekeeping? N  Some recent data might be hidden    Patient Care Team: [name], NP as PCP - General (Internal Medicine) Bary Castilla, [name], MD (General Surgery) [name], MD (Inactive) as Consulting Physician (Family Medicine) Lloyd Huger, MD as Consulting Physician (Oncology)  Indicate any recent Medical Services you may have received from other than [facility] providers in the past year (date may be approximate).     Assessment:   This is a routine wellness examination for Ja[place]ha.  Hearing/Vision screen  Hearing Screening   125Hz  250Hz  500Hz  1000Hz  2000Hz  3000Hz  4000Hz  6000Hz  8000Hz   Right ear:           Left ear:           Vision Screening Comments: Patient gets annual eye exams   Dietary issues and exercise activities discussed: Current Exercise Habits: Home exercise routine, Type of exercise: Other - see comments (exercise bike), Time  (Minutes): 30, Frequency (Times/Week): 3, Weekly Exercise (Minutes/Week): 90, Intensity: Moderate, Exercise limited by: None identified  Goals    . Increase water intake     Starting 02/03/2018, I will attempt  to drink at least 6-8 glasses of water daily.     . Patient Stated     02/09/2019, I will maintain and continue medications as prescribed.     . Patient Stated     02/17/2020, I will continue to ride my exercise bike 3 days a week for 30 minutes.      Depression Screen PHQ 2/9 Scores 02/17/2020 02/09/2019 02/03/2018 02/02/2017 11/05/2015  PHQ - 2 Score 0 0 0 0 0  PHQ- 9 Score 0 0 0 0 -    Fall Risk Fall Risk  02/17/2020 11/08/2019 02/09/2019 11/15/2018 05/06/2018  Falls in the past year? 0 0 0 0 0  Comment - Emmi Telephone Survey: data to providers prior to load - - -  Number falls in past yr: 0 - 0 - -  Injury with Fall? 0 - 0 - -  Risk for fall due to : Medication side effect - Medication side effect - -  Follow up Falls evaluation completed;Falls prevention discussed - Falls evaluation completed;Falls prevention discussed - -    Any stairs in or around the home? Yes  If so, are there any without handrails? No  Home free of loose throw rugs in walkways, pet beds, electrical cords, etc? Yes  Adequate lighting in your home to reduce risk of falls? Yes   ASSISTIVE DEVICES UTILIZED TO PREVENT FALLS:  Life alert? No  Use of a cane, walker or w/c? No  Grab bars in the bathroom? No  Shower chair or bench in shower? No  Elevated toilet seat or a handicapped toilet? No   TIMED UP AND GO:  Was the test performed? N/A, telephonic visit.    Cognitive Function: MMSE - Mini Mental State Exam 02/17/2020 02/09/2019 02/03/2018 02/02/2017  Orientation to time 5 5 5 5   Orientation to Place 5 5 5 5   Registration 3 3 3 3   Attention/ Calculation 5 5 0 0  Recall 3 3 3 2   Recall-comments - - - unable to recall 1 of 3 words  Language- name 2 objects - - 0 0  Language- repeat 1 1 1 1     Language- follow 3 step command - - 3 2  Language- follow 3 step command-comments - - - unable to follow 1 step of 3 step command  Language- read & follow direction - - 0 0  Write a sentence - - 0 0  Copy design - - 0 0  Total score - - 20 18  Mini Cog  Mini-Cog screen was completed. Maximum score is 22. A value of 0 denotes this part of the MMSE was not completed or the patient failed this part of the Mini-Cog screening.       Immunizations Immunization History  Administered Date(s) Administered  . Fluad Quad(high Dose 65+) 12/24/2018  . Influenza,inj,Quad PF,6+ Mos 03/03/2016, 02/02/2017, 01/11/2018  . Influenza-Unspecified 01/12/2020  . PFIZER SARS-COV-2 Vaccination 06/07/2019, 06/28/2019, 01/25/2020  . Pneumococcal Conjugate-13 11/05/2015  . Pneumococcal Polysaccharide-23 02/09/2017  . Tdap 09/10/2010  . Zoster 04/15/1999    TDAP status: Up to date Flu Vaccine status: Up to date Pneumococcal vaccine status: Up to date Covid-19 vaccine status: Completed vaccines  Qualifies for Shingles Vaccine? Yes   Zostavax completed Yes   Shingrix Completed?: No.    Education has been provided regarding the importance of this vaccine. Patient has been advised to call insurance company to determine out of pocket expense if they have not yet received this vaccine. Advised may also receive  vaccine at local pharmacy or Health Dept. Verbalized acceptance and understanding.  Screening Tests Health Maintenance  Topic Date Due  . Hepatitis C Screening  Never done  . HEMOGLOBIN A1C  11/16/2019  . FOOT EXAM  05/18/2020  . OPHTHALMOLOGY EXAM  07/13/2020  . TETANUS/TDAP  09/09/2020  . INFLUENZA VACCINE  Completed  . DEXA SCAN  Completed  . COVID-19 Vaccine  Completed  . PNA vac Low Risk Adult  Completed    Health Maintenance  Health Maintenance Due  Topic Date Due  . Hepatitis C Screening  Never done  . HEMOGLOBIN A1C  11/16/2019    Colorectal cancer screening: No longer required.   Mammogram status: Completed 11/21/2019. Repeat every year Bone Density status: Completed 11/21/2019. Results reflect: Bone density results: OSTEOPENIA. Repeat every 2 years.  Lung Cancer Screening: (Low Dose CT Chest recommended if Age 84-80 years, 30 pack-year currently smoking OR have quit w/in 15years.) does not qualify.    Additional Screening:  Hepatitis C Screening: does qualify; Completed due  Vision Screening: Recommended annual ophthalmology exams for early detection of glaucoma and other disorders of the eye. Is the patient up to date with their annual eye exam?  Yes  Who is the provider or what is the name of the office in which the patient attends annual eye exams? [facility] If pt is not established with a provider, would they like to be referred to a provider to establish care? No .   Dental Screening: Recommended annual dental exams for proper oral hygiene  Community Resource Referral / Chronic Care Management: CRR required this visit?  No   CCM required this visit?  No      Plan:     I have personally reviewed and noted the following in the patient's chart:   . Medical and social history . Use of alcohol, tobacco or illicit drugs  . Current medications and supplements . Functional ability and status . Nutritional status . Physical activity . Advanced directives . List of other physicians . Hospitalizations, surgeries, and ER visits in previous 12 months . Vitals . Screenings to include cognitive, depression, and falls . Referrals and appointments  In addition, I have reviewed and discussed with patient certain preventive protocols, quality metrics, and best practice recommendations. A written personalized care plan for preventive services as well as general preventive health recommendations were provided to patient.   Due to this being a telephonic visit, the after visit summary with patients personalized plan was offered to patient via office or  my-chart. Patient preferred to pick up at office at next visit or via mychart.   Andrez Grime, LPN   05/19/971

## 2020-02-17 NOTE — Progress Notes (Signed)
PCP notes:  Health Maintenance: No gaps noted   Abnormal Screenings: none   Patient concerns: none   Nurse concerns: none   Next PCP appt.: 02/21/2020 @ 7:40 am

## 2020-02-21 ENCOUNTER — Encounter: Payer: Self-pay | Admitting: Primary Care

## 2020-02-21 ENCOUNTER — Ambulatory Visit (INDEPENDENT_AMBULATORY_CARE_PROVIDER_SITE_OTHER): Payer: PPO | Admitting: Primary Care

## 2020-02-21 ENCOUNTER — Other Ambulatory Visit: Payer: Self-pay

## 2020-02-21 VITALS — BP 144/88 | HR 95 | Temp 97.7°F | Ht 67.0 in | Wt 159.0 lb

## 2020-02-21 DIAGNOSIS — Z170 Estrogen receptor positive status [ER+]: Secondary | ICD-10-CM | POA: Diagnosis not present

## 2020-02-21 DIAGNOSIS — C50412 Malignant neoplasm of upper-outer quadrant of left female breast: Secondary | ICD-10-CM | POA: Diagnosis not present

## 2020-02-21 DIAGNOSIS — I10 Essential (primary) hypertension: Secondary | ICD-10-CM | POA: Diagnosis not present

## 2020-02-21 DIAGNOSIS — R000 Tachycardia, unspecified: Secondary | ICD-10-CM | POA: Diagnosis not present

## 2020-02-21 DIAGNOSIS — M810 Age-related osteoporosis without current pathological fracture: Secondary | ICD-10-CM | POA: Diagnosis not present

## 2020-02-21 DIAGNOSIS — E785 Hyperlipidemia, unspecified: Secondary | ICD-10-CM

## 2020-02-21 DIAGNOSIS — E119 Type 2 diabetes mellitus without complications: Secondary | ICD-10-CM

## 2020-02-21 DIAGNOSIS — N1831 Chronic kidney disease, stage 3a: Secondary | ICD-10-CM | POA: Diagnosis not present

## 2020-02-21 DIAGNOSIS — Z0000 Encounter for general adult medical examination without abnormal findings: Secondary | ICD-10-CM

## 2020-02-21 NOTE — Assessment & Plan Note (Signed)
Stable with recent A1C of 6.5. Slightly higher than last year with A1C of 6.1, but still overall under good control without treatment.  Continue to monitor. Repeat A1C in 6 months.  Foot and eye exams UTD. Managed on ARB and statin. Pneumonia vaccines UTD.

## 2020-02-21 NOTE — Progress Notes (Signed)
Subjective:    Patient ID: Catherine Tate, female    DOB: 04-27-1940, 79 y.o.   MRN: [id number]  HPI  This visit occurred during the SARS-CoV-2 public health emergency.  Safety protocols were in place, including screening questions prior to the visit, additional usage of staff PPE, and extensive cleaning of exam room while observing appropriate contact time as indicated for disinfecting solutions.   Catherine Tate is a 79 year old female who presents today for complete physical.  Immunizations: -Tetanus: Completed in 2012 -Influenza: Completed today -Shingles: Completed Zostavax in 2011 -Pneumonia: Completed Prevnar in 2017, Pneumovax in 2018 -Covid-19: Completed series   Diet: She endorses a fair diet. Exercise: No regular exercise but is active.  Eye exam: Completes annually  Dental exam: No recent exam, dentures   Mammogram: Completed in 2021 Dexa: Completed in 2021, compliant to calcium and vitamin D. Colonoscopy: Has declined in prior years despite recommendations, declines today due to age. Hep C Screen:  Due  BP Readings from Last 3 Encounters:  02/21/20 (!) 144/88  01/25/20 (!) 151/95  01/25/20 (!) 151/95     Review of Systems  Constitutional: Negative for unexpected weight change.  HENT: Negative for rhinorrhea.   Eyes: Negative for visual disturbance.  Respiratory: Negative for cough and shortness of breath.   Cardiovascular: Negative for chest pain.  Gastrointestinal: Negative for constipation and diarrhea.  Genitourinary: Negative for difficulty urinating.  Musculoskeletal: Negative for arthralgias and myalgias.  Skin: Negative for rash.  Allergic/Immunologic: Positive for environmental allergies.  Neurological: Negative for dizziness and headaches.  Psychiatric/Behavioral: The patient is not nervous/anxious.        Past Medical History:  Diagnosis Date  . Arthritis   . 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 Cataract 02/03/2018  .  Chickenpox   . Complication of anesthesia   . Frequent UTI   . GERD (gastroesophageal reflux disease)    RARE  . Hypertension   . Personal history of chemotherapy 2017/2018   F/U left breast cancer  . Personal history of radiation therapy 2018   F/U left breast cancer  . PONV (postoperative nausea and vomiting)   . RSD (reflex sympathetic dystrophy)   . Seasonal allergies   . Skin cancer   . Urine incontinence      Social History   Socioeconomic History  . Marital status: Single    Spouse name: Not on file  . Number of children: Not on file  . Years of education: Not on file  . Highest education level: Not on file  Occupational History  . Not on file  Tobacco Use  . Smoking status: Former Smoker    Packs/day: 1.00    Years: 20.00    Pack years: 20.00    Types: Cigarettes    Quit date: 04/14/1994    Years since quitting: 25.8  . Smokeless tobacco: Never Used  Vaping Use  . Vaping Use: Never used  Substance and Sexual Activity  . Alcohol use: No    Alcohol/week: 0.0 standard drinks  . Drug use: No  . Sexual activity: Not Currently  Other Topics Concern  . Not on file  Social History Narrative   Single.   2 children, 1 grandchild.   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   Enjoys making jewelry, puzzle books, sewing, traveling.   Social Determinants of Health   Financial Resource Strain: Low Risk   . Difficulty of Paying Living Expenses: Not hard at all  Food Insecurity: No Food Insecurity  . Worried About Charity fundraiser in the Last Year: Never true  . Ran Out of Food in the Last Year: Never true  Transportation Needs: No Transportation Needs  . Lack of Transportation (Medical): No  . Lack of Transportation (Non-Medical): No  Physical Activity: Insufficiently Active  . Days of Exercise per Week: 3 days  . Minutes of Exercise per Session: 30 min  Stress: No Stress Concern Present  . Feeling of Stress : Not at all  Social Connections:   .  Frequency of Communication with Friends and Family: Not on file  . Frequency of Social Gatherings with Friends and Family: Not on file  . Attends Religious Services: Not on file  . Active Member of Clubs or Organizations: Not on file  . Attends Archivist Meetings: Not on file  . Marital Status: Not on file  Intimate Partner Violence: Not At Risk  . Fear of Current or Ex-Partner: No  . Emotionally Abused: No  . Physically Abused: No  . Sexually Abused: No    Past Surgical History:  Procedure Laterality Date  . ABDOMINAL HYSTERECTOMY  2005   total/ Dr Davis Gourd  . BLADDER TUMOR EXCISION  2006  . BREAST BIOPSY Left 03/13/2016   INVASIVE MAMMARY CARCINOMA  . BREAST LUMPECTOMY Left 2018  . BREAST LUMPECTOMY WITH SENTINEL LYMPH NODE BIOPSY Left 07/14/2016   Procedure: BREAST LUMPECTOMY WITH SENTINEL LYMPH NODE BX;  Surgeon: [name], MD;  Location: [facility] ORS;  Service: General;  Laterality: Left;  . denture surgery    . OOPHORECTOMY    . PORTACATH PLACEMENT N/A 04/01/2016   Procedure: INSERTION PORT-A-CATH;  Surgeon: [name], MD;  Location: [facility] ORS;  Service: General;  Laterality: N/A;  . SKIN CANCER EXCISION Right 2015   foot/ Dr [place] Crane  . TUBAL LIGATION    . TUMOR EXCISION Right    FOOT    Family History  Problem Relation Age of Onset  . Emphysema Father   . Heart disease Mother   . Transient ischemic attack Mother   . Breast cancer Cousin        paternal  . Kidney cancer Neg Hx   . Bladder Cancer Neg Hx     Allergies  Allergen Reactions  . Bee Pollen Hives  . Mosquito (Diagnostic) Hives  . Other Other (See Comments)    Uncoded Allergy. Allergen: VICRYL  (90% glycolide and 10% L-lactide [polyglactin-- synthetic absorbable sterile surgical suture]), Other Reaction: Infection  . Gabapentin Swelling  . Mosquito (Culex Pipiens) Allergy Skin Test Hives  . Norvasc [Amlodipine Besylate] Swelling    TO FEET  . Lisinopril Cough    Current  Outpatient Medications on File Prior to Visit  Medication Sig Dispense Refill  . acetaminophen (TYLENOL) 500 MG tablet Take 1,000 mg by mouth every 6 (six) hours as needed for moderate pain.     . Ascorbic Acid (VITAMIN C) 1000 MG tablet Take 1,000 mg by mouth daily.    Marland Kitchen atorvastatin (LIPITOR) 10 MG tablet TAKE 1 TABLET BY MOUTH ONCE A DAY FOR CHOLSTEROL. 90 tablet 3  . Calcium Carb-Cholecalciferol (CALCIUM/VITAMIN D PO) Take 1,200 mg by mouth.    . CRANBERRY PO Take 1 capsule by mouth 2 (two) times daily.    . hydrochlorothiazide (HYDRODIURIL) 25 MG tablet TAKE 1 TABLET BY MOUTH ONCE DAILY 90 tablet 1  . letrozole (FEMARA) 2.5 MG tablet TAKE 1 TABLET BY MOUTH ONCE DAILY 90  tablet 3  . losartan (COZAAR) 50 MG tablet TAKE 1 TABLET BY MOUTH ONCE DAILY 90 tablet 1  . Probiotic Product (PROBIOTIC PO) Take 1 tablet by mouth daily.     No current facility-administered medications on file prior to visit.    BP (!) 144/88   Pulse (!) 110   Temp 97.7 F (36.5 C) (Temporal)   Ht 5\' 7"  (1.702 m)   Wt 159 lb (72.1 kg)   SpO2 97%   BMI 24.90 kg/m    Objective:   Physical Exam HENT:     Right Ear: Tympanic membrane and ear canal normal.     Left Ear: Tympanic membrane and ear canal normal.  Eyes:     Pupils: Pupils are equal, round, and reactive to light.  Cardiovascular:     Rate and Rhythm: Normal rate and regular rhythm.  Pulmonary:     Effort: Pulmonary effort is normal.     Breath sounds: Normal breath sounds.  Abdominal:     General: Bowel sounds are normal.     Palpations: Abdomen is soft.     Tenderness: There is no abdominal tenderness.  Musculoskeletal:        General: Normal range of motion.     Cervical back: Neck supple.  Skin:    General: Skin is warm and dry.  Neurological:     Mental Status: She is alert and oriented to person, place, and time.     Cranial Nerves: No cranial nerve deficit.     Deep Tendon Reflexes:     Reflex Scores:      Patellar reflexes are  2+ on the right side and 2+ on the left side. Psychiatric:        Mood and Affect: Mood normal.            Assessment & Plan:

## 2020-02-21 NOTE — Patient Instructions (Signed)
Continue to work on a healthy diet.  Continue exercising. You should be getting regular activity daily.   Please schedule a follow up appointment in 6 months for diabetes check.   It was a pleasure to see you today!   Preventive Care 78 Years and Older, Female Preventive care refers to lifestyle choices and visits with your health care provider that can promote health and wellness. This includes:  A yearly physical exam. This is also called an annual well check.  Regular dental and eye exams.  Immunizations.  Screening for certain conditions.  Healthy lifestyle choices, such as diet and exercise. What can I expect for my preventive care visit? Physical exam Your health care provider will check:  Height and weight. These may be used to calculate body mass index (BMI), which is a measurement that tells if you are at a healthy weight.  Heart rate and blood pressure.  Your skin for abnormal spots. Counseling Your health care provider may ask you questions about:  Alcohol, tobacco, and drug use.  Emotional well-being.  Home and relationship well-being.  Sexual activity.  Eating habits.  History of falls.  Memory and ability to understand (cognition).  Work and work environment.  Pregnancy and menstrual history. What immunizations do I need?  Influenza (flu) vaccine  This is recommended every year. Tetanus, diphtheria, and pertussis (Tdap) vaccine  You may need a Td booster every 10 years. Varicella (chickenpox) vaccine  You may need this vaccine if you have not already been vaccinated. Zoster (shingles) vaccine  You may need this after age 78. Pneumococcal conjugate (PCV13) vaccine  One dose is recommended after age 78. Pneumococcal polysaccharide (PPSV23) vaccine  One dose is recommended after age 78. Measles, mumps, and rubella (MMR) vaccine  You may need at least one dose of MMR if you were born in 1957 or later. You may also need a second  dose. Meningococcal conjugate (MenACWY) vaccine  You may need this if you have certain conditions. Hepatitis A vaccine  You may need this if you have certain conditions or if you travel or work in places where you may be exposed to hepatitis A. Hepatitis B vaccine  You may need this if you have certain conditions or if you travel or work in places where you may be exposed to hepatitis B. Haemophilus influenzae type b (Hib) vaccine  You may need this if you have certain conditions. You may receive vaccines as individual doses or as more than one vaccine together in one shot (combination vaccines). Talk with your health care provider about the risks and benefits of combination vaccines. What tests do I need? Blood tests  Lipid and cholesterol levels. These may be checked every 5 years, or more frequently depending on your overall health.  Hepatitis C test.  Hepatitis B test. Screening  Lung cancer screening. You may have this screening every year starting at age 78 if you have a 30-pack-year history of smoking and currently smoke or have quit within the past 15 years.  Colorectal cancer screening. All adults should have this screening starting at age 78 and continuing until age 75. Your health care provider may recommend screening at age 78 if you are at increased risk. You will have tests every 1-10 years, depending on your results and the type of screening test.  Diabetes screening. This is done by checking your blood sugar (glucose) after you have not eaten for a while (fasting). You may have this done every 1-3 years.    Mammogram. This may be done every 1-2 years. Talk with your health care provider about how often you should have regular mammograms.  BRCA-related cancer screening. This may be done if you have a family history of breast, ovarian, tubal, or peritoneal cancers. Other tests  Sexually transmitted disease (STD) testing.  Bone density scan. This is done to screen for  osteoporosis. You may have this done starting at age 78. Follow these instructions at home: Eating and drinking  Eat a diet that includes fresh fruits and vegetables, whole grains, lean protein, and low-fat dairy products. Limit your intake of foods with high amounts of sugar, saturated fats, and salt.  Take vitamin and mineral supplements as recommended by your health care provider.  Do not drink alcohol if your health care provider tells you not to drink.  If you drink alcohol: ? Limit how much you have to 0-1 drink a day. ? Be aware of how much alcohol is in your drink. In the [place] one drink equals one 12 oz bottle of beer (355 mL), one 5 oz glass of wine (148 mL), or one 1 oz glass of hard liquor (44 mL). Lifestyle  Take daily care of your teeth and gums.  Stay active. Exercise for at least 30 minutes on 5 or more days each week.  Do not use any products that contain nicotine or tobacco, such as cigarettes, e-cigarettes, and chewing tobacco. If you need help quitting, ask your health care provider.  If you are sexually active, practice safe sex. Use a condom or other form of protection in order to prevent STIs (sexually transmitted infections).  Talk with your health care provider about taking a low-dose aspirin or statin. What's next?  Go to your health care provider once a year for a well check visit.  Ask your health care provider how often you should have your eyes and teeth checked.  Stay up to date on all vaccines. This information is not intended to replace advice given to you by your health care provider. Make sure you discuss any questions you have with your health care provider. Document Revised: 03/25/2018 Document Reviewed: 03/25/2018 Elsevier Patient Education  2020 Elsevier Inc.    

## 2020-02-21 NOTE — Assessment & Plan Note (Signed)
LDL at goal on atorvastatin 10 mg, continue same.

## 2020-02-21 NOTE — Assessment & Plan Note (Signed)
Mammogram UTD and reviewed.  Compliant to Femara daily. Following with oncology.

## 2020-02-21 NOTE — Assessment & Plan Note (Signed)
Recent Bone Density scan reviewed, improved from prior. No longer on Fosamax per oncology.   Continue calcium, vitamin D, weight bearing exercise.

## 2020-02-21 NOTE — Assessment & Plan Note (Signed)
Noted today which did improve down to 95 during visit. Continue to monitor.

## 2020-02-21 NOTE — Assessment & Plan Note (Signed)
Borderline high today, endorses "white coat syndrome", HR improved during exam.   Continue losartan 50 mg, recommended she monitor BP at home. CMP reviewed.

## 2020-02-21 NOTE — Assessment & Plan Note (Signed)
Stable on recent labs. Continue ARB for renal protection. Continue to monitor.

## 2020-02-21 NOTE — Assessment & Plan Note (Signed)
Immunizations UTD. Mammogram UTD. Bone density scan UTD. Encouraged regular weight bearing activity.  Exam today stable. Labs reviewed.

## 2020-03-29 ENCOUNTER — Other Ambulatory Visit: Payer: Self-pay | Admitting: Primary Care

## 2020-03-29 DIAGNOSIS — E785 Hyperlipidemia, unspecified: Secondary | ICD-10-CM

## 2020-03-29 DIAGNOSIS — I10 Essential (primary) hypertension: Secondary | ICD-10-CM

## 2020-06-21 ENCOUNTER — Other Ambulatory Visit: Payer: Self-pay | Admitting: Oncology

## 2020-06-21 ENCOUNTER — Other Ambulatory Visit: Payer: Self-pay | Admitting: Primary Care

## 2020-06-21 DIAGNOSIS — I10 Essential (primary) hypertension: Secondary | ICD-10-CM

## 2020-07-21 NOTE — Progress Notes (Signed)
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  INTERVAL HISTORY: Patient returns to clinic today for routine 11-monthevaluation.  She continues to tolerate letrozole and Fosamax without significant side effects.  She currently feels well and is asymptomatic. She has no neurologic complaints.  She denies any recent fevers or illnesses.  She denies any chest pain, shortness of breath, cough, or hemoptysis.  She denies any nausea, vomiting, constipation, or diarrhea. She has no urinary complaints.  Patient feels at her baseline offers no specific complaints today.  REVIEW OF SYSTEMS:   Review of Systems  Constitutional: Negative.  Negative for fever, malaise/fatigue and weight loss.  HENT: Negative for sinus pain.   Respiratory: Negative.  Negative for cough and shortness of breath.   Cardiovascular: Negative.  Negative for chest pain and leg swelling.  Gastrointestinal: Negative.  Negative for abdominal pain, constipation and nausea.  Genitourinary: Negative for hematuria and urgency.  Musculoskeletal: Negative.   Skin: Negative.  Negative for rash.  Neurological: Negative.  Negative for sensory change, weakness and headaches.  Psychiatric/Behavioral: Negative.  The patient is not nervous/anxious and does not have insomnia.      As per HPI. Otherwise, a complete review of systems is negative.  PAST MEDICAL HISTORY: Past Medical History:  Diagnosis Date  . Arthritis   . Cancer ([facility] 03/13/2016   INVASIVE  MAMMARY CARCINOMA  . Cataract 02/03/2018  . Chickenpox   . Complication of anesthesia   . Frequent UTI   . GERD (gastroesophageal reflux disease)    RARE  . Hypertension   . Personal history of chemotherapy 2017/2018   F/U left breast cancer  . Personal history of radiation therapy 2018   F/U left breast cancer  . PONV (postoperative nausea and vomiting)   . RSD (reflex sympathetic dystrophy)   . Seasonal allergies   . Skin cancer   . Urine incontinence     PAST SURGICAL HISTORY: Past Surgical History:  Procedure Laterality Date  . ABDOMINAL HYSTERECTOMY  2005   total/ Dr WDavis Gourd . BLADDER TUMOR EXCISION  2006  . BREAST BIOPSY Left 03/13/2016   INVASIVE MAMMARY CARCINOMA  . BREAST LUMPECTOMY Left 2018  . BREAST LUMPECTOMY WITH SENTINEL LYMPH NODE BIOPSY Left 07/14/2016   Procedure: BREAST LUMPECTOMY WITH SENTINEL LYMPH NODE BX;  Surgeon: JRobert Bellow MD;  Location: [facility] ORS;  Service: General;  Laterality: Left;  . denture surgery    . OOPHORECTOMY    . PORTACATH PLACEMENT N/A 04/01/2016   Procedure: INSERTION PORT-A-CATH;  Surgeon: JRobert Bellow MD;  Location: [facility] ORS;  Service: General;  Laterality: N/A;  . SKIN CANCER EXCISION Right 2015   foot/ Dr HN[place]ada Crane . TUBAL LIGATION    . TUMOR EXCISION Right    FOOT    FAMILY HISTORY: Family History  Problem Relation Age of Onset  . Emphysema Father   . Heart disease Mother   . Transient ischemic attack Mother   . Breast cancer Cousin        paternal  . Kidney cancer Neg Hx   .  Bladder Cancer Neg Hx     ADVANCED DIRECTIVES (Y/N):  N  HEALTH MAINTENANCE: Social History   Tobacco Use  . Smoking status: Former Smoker    Packs/day: 1.00    Years: 20.00    Pack years: 20.00    Types: Cigarettes    Quit date: 04/14/1994    Years since quitting: 26.3  . Smokeless tobacco: Never Used  Vaping Use  . Vaping Use: Never used  Substance Use Topics  . Alcohol use: No    Alcohol/week: 0.0 standard drinks  .  Drug use: No     Colonoscopy:  PAP:  Bone density:  Lipid panel:  Allergies  Allergen Reactions  . Bee Pollen Hives  . Mosquito (Diagnostic) Hives  . Other Other (See Comments)    Uncoded Allergy. Allergen: VICRYL  (90% glycolide and 10% L-lactide [polyglactin-- synthetic absorbable sterile surgical suture]), Other Reaction: Infection  . Gabapentin Swelling  . Mosquito (Culex Pipiens) Allergy Skin Test Hives  . Norvasc [Amlodipine Besylate] Swelling    TO FEET  . Lisinopril Cough    Current Outpatient Medications  Medication Sig Dispense Refill  . acetaminophen (TYLENOL) 500 MG tablet Take 1,000 mg by mouth every 6 (six) hours as needed for moderate pain.     . Ascorbic Acid (VITAMIN C) 1000 MG tablet Take 1,000 mg by mouth daily.    Marland Kitchen atorvastatin (LIPITOR) 10 MG tablet TAKE 1 TABLET BY MOUTH ONCE A DAY FOR CHOLSTEROL. 90 tablet 3  . Calcium Carb-Cholecalciferol (CALCIUM/VITAMIN D PO) Take 1,200 mg by mouth.    . CRANBERRY PO Take 1 capsule by mouth 2 (two) times daily.    . hydrochlorothiazide (HYDRODIURIL) 25 MG tablet TAKE 1 TABLET BY MOUTH ONCE DAILY 90 tablet 1  . letrozole (FEMARA) 2.5 MG tablet TAKE 1 TABLET BY MOUTH ONCE DAILY 90 tablet 0  . losartan (COZAAR) 50 MG tablet TAKE 1 TABLET BY MOUTH ONCE DAILY 90 tablet 1  . Probiotic Product (PROBIOTIC PO) Take 1 tablet by mouth daily.     No current facility-administered medications for this visit.    OBJECTIVE: Vitals:   07/26/20 1056  BP: 125/75  Pulse: (!) 102  Resp: 18  Temp: (!) 97.4 F (36.3 C)     Body mass index is 25.14 kg/m.    ECOG FS:0 - Asymptomatic  General: Well-developed, well-nourished, no acute distress. Eyes: Pink conjunctiva, anicteric sclera. HEENT: Normocephalic, moist mucous membranes. Breasts: Exam deferred today. Lungs: No audible wheezing or coughing. Heart: Regular rate and rhythm. Abdomen: Soft, nontender, no obvious distention. Musculoskeletal: No edema, cyanosis, or  clubbing. Neuro: Alert, answering all questions appropriately. Cranial nerves grossly intact. Skin: No rashes or petechiae noted. Psych: Normal affect.   LAB RESULTS:  Lab Results  Component Value Date   NA 137 02/17/2020   K 3.8 02/17/2020   CL 101 02/17/2020   CO2 27 02/17/2020   GLUCOSE 111 (H) 02/17/2020   BUN 16 02/17/2020   CREATININE 1.12 02/17/2020   CALCIUM 9.3 02/17/2020   PROT 7.1 02/17/2020   ALBUMIN 4.3 02/17/2020   AST 20 02/17/2020   ALT 18 02/17/2020   ALKPHOS 45 02/17/2020   BILITOT 0.7 02/17/2020   GFRNONAA 50 (L) 12/22/2016   GFRAA 58 (L) 12/22/2016    Lab Results  Component Value Date   WBC 7.1 02/09/2019   NEUTROABS 5.3 02/03/2018   HGB 12.0 02/09/2019   HCT 35.7 (L) 02/09/2019   MCV 90.4 02/09/2019   PLT 243.0 02/09/2019  STUDIES: No results found.  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  PLAN:    1. 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 She then underwent a lumpectomy on July 14, 2016 and completed adjuvant XRT.  Continue letrozole for a minimum of 5 years completing treatment in June 2023.  Her most recent mammogram on November 21, 2019 was reported as BI-RADS 2.  Repeat in August 2022.  Return to clinic in 6 months for routine evaluation.   2.  Decreased EF%: Patient had a pretreatment EF of 41%. Continue monitoring evaluation per cardiology. 3.  Osteopenia: Patient's most recent bone mineral density on November 21, 2019 reported T score of -1.8 which reveals continued improvement over the past several years where her T score has decreased from -2.5 to -2.2 to -1.8.  Patient has now discontinued Fosamax.  Continue calcium and vitamin D supplementation.  Repeat bone mineral density in August 2022 along with  mammogram.  Patient expressed understanding and was in agreement with this plan. She also understands that She can call clinic at any time with any questions, concerns, or complaints.    Cancer Staging Malignant neoplasm of upper-outer quadrant of left female breast [facility]) Staging form: Breast, AJCC 7th Edition - Clinical stage from 03/19/2016: Stage IIA (T2, N0, M0) - Signed by Lloyd Huger, MD on 04/06/2016 Laterality: Right Estrogen receptor status: Positive Progesterone receptor status: Positive HER2 status: Negative - Pathologic stage from 07/27/2016: Stage IIA (T2, N0, cM0) - Signed by Lloyd Huger, MD on 07/27/2016 Laterality: Left Estrogen receptor status: Positive Progesterone receptor status: Positive HER2 status: Negative    Lloyd Huger, MD 07/27/20 8:26 AM

## 2020-07-26 ENCOUNTER — Encounter: Payer: Self-pay | Admitting: Oncology

## 2020-07-26 ENCOUNTER — Inpatient Hospital Stay: Payer: PPO | Attending: Oncology | Admitting: Oncology

## 2020-07-26 ENCOUNTER — Other Ambulatory Visit: Payer: Self-pay

## 2020-07-26 VITALS — BP 125/75 | HR 102 | Temp 97.4°F | Resp 18 | Ht 67.0 in | Wt 160.5 lb

## 2020-07-26 DIAGNOSIS — C50412 Malignant neoplasm of upper-outer quadrant of left female breast: Secondary | ICD-10-CM | POA: Insufficient documentation

## 2020-07-26 DIAGNOSIS — Z170 Estrogen receptor positive status [ER+]: Secondary | ICD-10-CM | POA: Diagnosis not present

## 2020-07-26 DIAGNOSIS — Z87891 Personal history of nicotine dependence: Secondary | ICD-10-CM | POA: Diagnosis not present

## 2020-07-26 DIAGNOSIS — M8580 Other specified disorders of bone density and structure, unspecified site: Secondary | ICD-10-CM | POA: Insufficient documentation

## 2020-08-21 ENCOUNTER — Encounter: Payer: Self-pay | Admitting: Primary Care

## 2020-08-21 ENCOUNTER — Ambulatory Visit (INDEPENDENT_AMBULATORY_CARE_PROVIDER_SITE_OTHER): Payer: PPO | Admitting: Primary Care

## 2020-08-21 ENCOUNTER — Other Ambulatory Visit: Payer: Self-pay

## 2020-08-21 VITALS — BP 124/62 | HR 100 | Temp 98.6°F | Ht 67.0 in | Wt 159.0 lb

## 2020-08-21 DIAGNOSIS — E119 Type 2 diabetes mellitus without complications: Secondary | ICD-10-CM

## 2020-08-21 LAB — POCT GLYCOSYLATED HEMOGLOBIN (HGB A1C): Hemoglobin A1C: 6.2 % — AB (ref 4.0–5.6)

## 2020-08-21 NOTE — Assessment & Plan Note (Signed)
Well controlled in the office today with A1C of 6.2. Continue off medications.  Foot exam today. Eye exam UTD, will obtain records. Managed on statin and ARB.  Follow up in 6 months.

## 2020-08-21 NOTE — Progress Notes (Signed)
Subjective:    Patient ID: Catherine Tate, female    DOB: 07-03-1940, 80 y.o.   MRN: [id number]  HPI  Catherine Tate is a very pleasant 80 y.o. female wit a history of hypertension, type 2 diabetes, recurrent UTI, breast cancer, CKD, hyperlipidemia who presents today for follow up of diabetes.  Current medications include: None  She is checking her blood glucose 0 times daily.  Last A1C: 6.5 in November 2021, 6.2 today. Last Eye Exam: Completed in 2021 Last Foot Exam: Due Pneumonia Vaccination: UTD ACE/ARB: losartan Statin: atorvastatin   BP Readings from Last 3 Encounters:  08/21/20 124/62  07/26/20 125/75  02/21/20 (!) 144/88       Review of Systems  Eyes: Negative for visual disturbance.  Respiratory: Negative for shortness of breath.   Cardiovascular: Negative for chest pain.  Neurological: Negative for dizziness and numbness.         Past Medical History:  Diagnosis Date  . Arthritis   . Cancer ([place]) 03/13/2016   INVASIVE MAMMARY CARCINOMA  . Cataract 02/03/2018  . Chickenpox   . Complication of anesthesia   . Frequent UTI   . GERD (gastroesophageal reflux disease)    RARE  . Hypertension   . Personal history of chemotherapy 2017/2018   F/U left breast cancer  . Personal history of radiation therapy 2018   F/U left breast cancer  . PONV (postoperative nausea and vomiting)   . RSD (reflex sympathetic dystrophy)   . Seasonal allergies   . Skin cancer   . Urine incontinence     Social History   Socioeconomic History  . Marital status: Single    Spouse name: Not on file  . Number of children: Not on file  . Years of education: Not on file  . Highest education level: Not on file  Occupational History  . Not on file  Tobacco Use  . Smoking status: Former Smoker    Packs/day: 1.00    Years: 20.00    Pack years: 20.00    Types: Cigarettes    Quit date: 04/14/1994    Years since quitting: 26.3  . Smokeless tobacco: Never Used  Vaping Use  .  Vaping Use: Never used  Substance and Sexual Activity  . Alcohol use: No    Alcohol/week: 0.0 standard drinks  . Drug use: No  . Sexual activity: Not Currently  Other Topics Concern  . Not on file  Social History Narrative   Single.   2 children, 1 grandchild.   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   Enjoys making jewelry, puzzle books, sewing, traveling.   Social Determinants of Health   Financial Resource Strain: Low Risk   . Difficulty of Paying Living Expenses: Not hard at all  Food Insecurity: No Food Insecurity  . Worried About Charity fundraiser in the Last Year: Never true  . Ran Out of Food in the Last Year: Never true  Transportation Needs: No Transportation Needs  . Lack of Transportation (Medical): No  . Lack of Transportation (Non-Medical): No  Physical Activity: Insufficiently Active  . Days of Exercise per Week: 3 days  . Minutes of Exercise per Session: 30 min  Stress: No Stress Concern Present  . Feeling of Stress : Not at all  Social Connections: Not on file  Intimate Partner Violence: Not At Risk  . Fear of Current or Ex-Partner: No  . Emotionally Abused: No  . Physically Abused: No  . Sexually  Abused: No    Past Surgical History:  Procedure Laterality Date  . ABDOMINAL HYSTERECTOMY  2005   total/ Dr Davis Gourd  . BLADDER TUMOR EXCISION  2006  . BREAST BIOPSY Left 03/13/2016   INVASIVE MAMMARY CARCINOMA  . BREAST LUMPECTOMY Left 2018  . BREAST LUMPECTOMY WITH SENTINEL LYMPH NODE BIOPSY Left 07/14/2016   Procedure: BREAST LUMPECTOMY WITH SENTINEL LYMPH NODE BX;  Surgeon: [name], MD;  Location: [facility] ORS;  Service: General;  Laterality: Left;  . denture surgery    . OOPHORECTOMY    . PORTACATH PLACEMENT N/A 04/01/2016   Procedure: INSERTION PORT-A-CATH;  Surgeon: [name], MD;  Location: [facility] ORS;  Service: General;  Laterality: N/A;  . SKIN CANCER EXCISION Right 2015   foot/ Dr [place] Crane  . TUBAL LIGATION    . TUMOR EXCISION  Right    FOOT    Family History  Problem Relation Age of Onset  . Emphysema Father   . Heart disease Mother   . Transient ischemic attack Mother   . Breast cancer Cousin        paternal  . Kidney cancer Neg Hx   . Bladder Cancer Neg Hx     Allergies  Allergen Reactions  . Bee Pollen Hives  . Mosquito (Diagnostic) Hives  . Other Other (See Comments)    Uncoded Allergy. Allergen: VICRYL  (90% glycolide and 10% L-lactide [polyglactin-- synthetic absorbable sterile surgical suture]), Other Reaction: Infection  . Gabapentin Swelling  . Mosquito (Culex Pipiens) Allergy Skin Test Hives  . Norvasc [Amlodipine Besylate] Swelling    TO FEET  . Lisinopril Cough    Current Outpatient Medications on File Prior to Visit  Medication Sig Dispense Refill  . acetaminophen (TYLENOL) 500 MG tablet Take 1,000 mg by mouth every 6 (six) hours as needed for moderate pain.     . Ascorbic Acid (VITAMIN C) 1000 MG tablet Take 1,000 mg by mouth daily.    Marland Kitchen atorvastatin (LIPITOR) 10 MG tablet TAKE 1 TABLET BY MOUTH ONCE A DAY FOR CHOLSTEROL. 90 tablet 3  . Calcium Carb-Cholecalciferol (CALCIUM/VITAMIN D PO) Take 1,200 mg by mouth.    . CRANBERRY PO Take 1 capsule by mouth 2 (two) times daily.    . hydrochlorothiazide (HYDRODIURIL) 25 MG tablet TAKE 1 TABLET BY MOUTH ONCE DAILY 90 tablet 1  . letrozole (FEMARA) 2.5 MG tablet TAKE 1 TABLET BY MOUTH ONCE DAILY 90 tablet 0  . losartan (COZAAR) 50 MG tablet TAKE 1 TABLET BY MOUTH ONCE DAILY 90 tablet 1  . Probiotic Product (PROBIOTIC PO) Take 1 tablet by mouth daily.     No current facility-administered medications on file prior to visit.    BP 124/62   Pulse 100   Temp 98.6 F (37 C) (Temporal)   Ht 5\' 7"  (1.702 m)   Wt 159 lb (72.1 kg)   SpO2 96%   BMI 24.90 kg/m  Objective:   Physical Exam Cardiovascular:     Rate and Rhythm: Normal rate and regular rhythm.  Pulmonary:     Effort: Pulmonary effort is normal.     Breath sounds: Normal  breath sounds.  Musculoskeletal:     Cervical back: Neck supple.  Skin:    General: Skin is warm and dry.           Assessment & Plan:      This visit occurred during the SARS-CoV-2 public health emergency.  Safety protocols were in place, including screening questions prior to  the visit, additional usage of staff PPE, and extensive cleaning of exam room while observing appropriate contact time as indicated for disinfecting solutions.

## 2020-08-21 NOTE — Patient Instructions (Signed)
Schedule your physical for 6 months.  It was a pleasure to see you today!

## 2020-09-07 ENCOUNTER — Encounter: Payer: Self-pay | Admitting: Primary Care

## 2020-09-20 ENCOUNTER — Other Ambulatory Visit: Payer: Self-pay | Admitting: Primary Care

## 2020-09-20 ENCOUNTER — Other Ambulatory Visit: Payer: Self-pay | Admitting: Oncology

## 2020-09-20 DIAGNOSIS — I10 Essential (primary) hypertension: Secondary | ICD-10-CM

## 2020-11-21 ENCOUNTER — Ambulatory Visit
Admission: RE | Admit: 2020-11-21 | Discharge: 2020-11-21 | Disposition: A | Discharge status: Home / Self Care | Payer: PPO | Source: Ambulatory Visit | Attending: Oncology | Admitting: Oncology

## 2020-11-21 ENCOUNTER — Other Ambulatory Visit: Payer: Self-pay

## 2020-11-21 DIAGNOSIS — M8589 Other specified disorders of bone density and structure, multiple sites: Secondary | ICD-10-CM | POA: Insufficient documentation

## 2020-11-21 DIAGNOSIS — Z1231 Encounter for screening mammogram for malignant neoplasm of breast: Secondary | ICD-10-CM | POA: Insufficient documentation

## 2020-11-21 DIAGNOSIS — Z780 Asymptomatic menopausal state: Secondary | ICD-10-CM | POA: Insufficient documentation

## 2020-11-21 DIAGNOSIS — Z923 Personal history of irradiation: Secondary | ICD-10-CM | POA: Insufficient documentation

## 2020-11-21 DIAGNOSIS — Z170 Estrogen receptor positive status [ER+]: Secondary | ICD-10-CM

## 2020-11-21 DIAGNOSIS — M810 Age-related osteoporosis without current pathological fracture: Secondary | ICD-10-CM | POA: Diagnosis not present

## 2020-11-21 DIAGNOSIS — Z853 Personal history of malignant neoplasm of breast: Secondary | ICD-10-CM | POA: Diagnosis not present

## 2020-11-21 DIAGNOSIS — C50412 Malignant neoplasm of upper-outer quadrant of left female breast: Secondary | ICD-10-CM

## 2020-11-21 DIAGNOSIS — Z13820 Encounter for screening for osteoporosis: Secondary | ICD-10-CM | POA: Insufficient documentation

## 2020-11-27 DIAGNOSIS — Z853 Personal history of malignant neoplasm of breast: Secondary | ICD-10-CM | POA: Diagnosis not present

## 2020-11-27 DIAGNOSIS — Z1211 Encounter for screening for malignant neoplasm of colon: Secondary | ICD-10-CM | POA: Diagnosis not present

## 2020-12-03 DIAGNOSIS — Z1211 Encounter for screening for malignant neoplasm of colon: Secondary | ICD-10-CM | POA: Diagnosis not present

## 2020-12-09 LAB — COLOGUARD: COLOGUARD: POSITIVE — AB

## 2020-12-11 ENCOUNTER — Other Ambulatory Visit: Payer: Self-pay | Admitting: General Surgery

## 2020-12-11 NOTE — Progress Notes (Signed)
Subjective:     Patient ID: Catherine Tate is a 80 y.o. female.   HPI   The following portions of the patient's history were reviewed and updated as appropriate.   This an established patient is here today for: office visit. Patient is here today to follow up from a recent mammogram and bone density test.  She has a history of left breast cancer in 2018. She states her left breasts is a little sore from doing yard work last week, otherwise she is doing well.   Review of Systems  Constitutional: Negative for chills and fever.  Respiratory: Negative for cough.          Chief Complaint  Patient presents with   Follow-up      BP 120/62   Pulse (!) 112   Temp 36.7 C (98 F)   Ht 170.2 cm ('5\' 7"' )   Wt 73 kg (161 lb)   SpO2 98%   BMI 25.22 kg/m        Past Medical History:  Diagnosis Date   Arthritis     Breast cancer (CMS-HCC) 03/13/2016    2.5 cm tumor, T2, N0. Neo-adjuvant chemotherapy, whole breast radiation.  ER/PR positive, HER-2 negative.   Cataract 02/03/2018   Chickenpox     Complication of anesthesia     Frequent UTI     GERD (gastroesophageal reflux disease)     History of chemotherapy     History of radiation therapy     Hypertension     Personal history of chemotherapy     Personal history of radiation therapy 2018   PONV (postoperative nausea and vomiting)     RSD (reflex sympathetic dystrophy)     Seasonal allergies     Skin cancer     Urine incontinence             Past Surgical History:  Procedure Laterality Date   ABDOMINAL HYSTERECTOMY   2005   bladder tumor excision   2006   denture surgery       INCISIONAL BIOPSY BREAST   03/13/2016   LAPAROSCOPIC TUBAL LIGATION       MASTECTOMY PARTIAL / LUMPECTOMY Left 07/14/2016   oopherectomy       port a cath placement    04/01/2016   skin cancer excision Right 2015    foot                OB History     Gravida  2   Para  2   Term      Preterm      AB      Living         SAB       IAB      Ectopic      Molar      Multiple      Live Births           Obstetric Comments  Age at first period 49 Age of first pregnancy 42             Social History          Socioeconomic History   Marital status: Single  Tobacco Use   Smoking status: Former Smoker   Smokeless tobacco: Never Used  Substance and Sexual Activity   Alcohol use: Never   Drug use: Never             Allergies  Allergen Reactions   Others Other (See Comments)  Uncoded Allergy. Allergen: VICRYL, Other Reaction: Infection   Neurontin [Gabapentin] Swelling   Bee Pollen Hives   Lisinopril Cough   Mosquito Allergenic Extract Hives   Norvasc [Amlodipine] Swelling      Feet swelling      Current Medications        Current Outpatient Medications  Medication Sig Dispense Refill   acetaminophen (TYLENOL) 500 MG tablet Take 1,000 mg by mouth every 6 (six) hours as needed          ascorbic acid, vitamin C, (VITAMIN C) 1000 MG tablet Take 1,000 mg by mouth once daily       atorvastatin (LIPITOR) 10 MG tablet Take 10 mg by mouth once daily          calcium carbonate-vitamin D3 (CALTRATE 600+D) 600 mg(1,530m) -400 unit tablet Take 1 tablet by mouth once daily       cranberry fruit extract (CRANBERRY ORAL) Take by mouth 2 (two) times daily       hydroCHLOROthiazide (HYDRODIURIL) 25 MG tablet Take 25 mg by mouth once daily          Lactobac no.41/Bifidobact no.7 (PROBIOTIC-10 ORAL) Take by mouth once daily       letrozole (FEMARA) 2.5 mg tablet Take 2.5 mg by mouth once daily          losartan (COZAAR) 50 MG tablet Take 50 mg by mouth once daily          alendronate (FOSAMAX) 70 MG tablet Take 70 mg by mouth every 7 (seven) days    (Patient not taking: Reported on 11/27/2020)        No current facility-administered medications for this visit.             Family History  Problem Relation Age of Onset   Heart disease Mother     Transient ischemic attack Mother     Emphysema Father      Breast cancer Cousin     Breast cancer Sister     Colon cancer Neg Hx             Objective:   Physical Exam Exam conducted with a chaperone present.  Constitutional:      Appearance: Normal appearance.  Cardiovascular:     Rate and Rhythm: Normal rate and regular rhythm.     Pulses: Normal pulses.     Heart sounds: Normal heart sounds.  Pulmonary:     Effort: Pulmonary effort is normal.     Breath sounds: Normal breath sounds.  Chest:  Breasts:     Right: Normal.      Musculoskeletal:     Cervical back: Neck supple.  Lymphadenopathy:     Upper Body:     Right upper body: No supraclavicular or axillary adenopathy.     Left upper body: No supraclavicular or axillary adenopathy.  Skin:    General: Skin is warm and dry.  Neurological:     Mental Status: She is alert and oriented to person, place, and time.  Psychiatric:        Mood and Affect: Mood normal.        Behavior: Behavior normal.      Labs and Radiology:    Screening mammograms dated November 21, 2020 ordered by Dr. FGrayland Ormondwere independently reviewed.  Dense calcifications in the field of prior surgery and radiation in the left breast.  Otherwise no interval change.  Bone density stable.  (Osteopenia).   Chart review showed no prior colon  cancer screening.      Assessment:     Stable breast exam now 4 years post left lumpectomy.    Plan:     Discussed colon cancer screening.  Patient was adamant about not having a colonoscopy.  She was amenable to Cologuard testing and a request has been sent in this regard.   The patient is tolerating her Femara well.  We reviewed the possibility that she may be recommended to continue this therapy outside of 5 years.  She may be offered BCI testing to determine if it would be beneficial.  She will review this with Dr. Grayland Ormond at her follow-up appointment.   We will plan to recheck her next year after screening mammograms.  She will be notified of the Cologuard  reports when they are available.      This note is partially prepared by Karie Fetch, RN, acting as a scribe in the presence of Dr. Hervey Ard, MD.  The documentation recorded by the scribe accurately reflects the service I personally performed and the decisions made by me.    [name], MD FACS  December 03, 2020: Status: Final result   Visible to patient: No (inaccessible in MyChart)   Dx: Colon cancer screening    Component 8 d ago   Cologuard Positive Abnormal

## 2020-12-19 ENCOUNTER — Other Ambulatory Visit: Payer: Self-pay | Admitting: Primary Care

## 2020-12-19 ENCOUNTER — Other Ambulatory Visit: Payer: Self-pay | Admitting: Oncology

## 2020-12-19 DIAGNOSIS — I10 Essential (primary) hypertension: Secondary | ICD-10-CM

## 2020-12-24 ENCOUNTER — Other Ambulatory Visit: Payer: Self-pay | Admitting: Primary Care

## 2020-12-24 DIAGNOSIS — E785 Hyperlipidemia, unspecified: Secondary | ICD-10-CM

## 2020-12-24 DIAGNOSIS — I10 Essential (primary) hypertension: Secondary | ICD-10-CM

## 2021-01-01 ENCOUNTER — Encounter: Payer: Self-pay | Admitting: General Surgery

## 2021-01-02 ENCOUNTER — Ambulatory Visit
Admission: RE | Admit: 2021-01-02 | Discharge: 2021-01-02 | Disposition: A | Discharge status: Home / Self Care | Payer: PPO | Attending: General Surgery | Admitting: General Surgery

## 2021-01-02 ENCOUNTER — Ambulatory Visit: Payer: PPO | Admitting: Anesthesiology

## 2021-01-02 ENCOUNTER — Encounter
Admission: RE | Disposition: A | Discharge status: Home / Self Care | Payer: Self-pay | Source: Home / Self Care | Attending: General Surgery

## 2021-01-02 DIAGNOSIS — Z1211 Encounter for screening for malignant neoplasm of colon: Secondary | ICD-10-CM | POA: Diagnosis not present

## 2021-01-02 DIAGNOSIS — Z87891 Personal history of nicotine dependence: Secondary | ICD-10-CM | POA: Diagnosis not present

## 2021-01-02 DIAGNOSIS — Z888 Allergy status to other drugs, medicaments and biological substances status: Secondary | ICD-10-CM | POA: Diagnosis not present

## 2021-01-02 DIAGNOSIS — Z85828 Personal history of other malignant neoplasm of skin: Secondary | ICD-10-CM | POA: Diagnosis not present

## 2021-01-02 DIAGNOSIS — R195 Other fecal abnormalities: Secondary | ICD-10-CM | POA: Diagnosis not present

## 2021-01-02 DIAGNOSIS — Z79899 Other long term (current) drug therapy: Secondary | ICD-10-CM | POA: Insufficient documentation

## 2021-01-02 DIAGNOSIS — Z923 Personal history of irradiation: Secondary | ICD-10-CM | POA: Diagnosis not present

## 2021-01-02 DIAGNOSIS — K5730 Diverticulosis of large intestine without perforation or abscess without bleeding: Secondary | ICD-10-CM | POA: Insufficient documentation

## 2021-01-02 DIAGNOSIS — D123 Benign neoplasm of transverse colon: Secondary | ICD-10-CM | POA: Diagnosis not present

## 2021-01-02 DIAGNOSIS — C50919 Malignant neoplasm of unspecified site of unspecified female breast: Secondary | ICD-10-CM | POA: Insufficient documentation

## 2021-01-02 DIAGNOSIS — D125 Benign neoplasm of sigmoid colon: Secondary | ICD-10-CM | POA: Insufficient documentation

## 2021-01-02 DIAGNOSIS — Z9221 Personal history of antineoplastic chemotherapy: Secondary | ICD-10-CM | POA: Diagnosis not present

## 2021-01-02 DIAGNOSIS — Z79811 Long term (current) use of aromatase inhibitors: Secondary | ICD-10-CM | POA: Insufficient documentation

## 2021-01-02 DIAGNOSIS — K219 Gastro-esophageal reflux disease without esophagitis: Secondary | ICD-10-CM | POA: Diagnosis not present

## 2021-01-02 HISTORY — PX: COLONOSCOPY WITH PROPOFOL: SHX5780

## 2021-01-02 SURGERY — COLONOSCOPY WITH PROPOFOL
Anesthesia: General

## 2021-01-02 MED ORDER — PROPOFOL 500 MG/50ML IV EMUL
INTRAVENOUS | Status: AC
  Filled 2021-01-02: qty 100

## 2021-01-02 MED ORDER — SODIUM CHLORIDE 0.9 % IV SOLN
INTRAVENOUS | Status: DC
  Administered 2021-01-02: 20 mL/h via INTRAVENOUS

## 2021-01-02 MED ORDER — LIDOCAINE HCL (PF) 2 % IJ SOLN
INTRAMUSCULAR | Status: AC
  Filled 2021-01-02: qty 5

## 2021-01-02 MED ORDER — DEXMEDETOMIDINE HCL IN NACL 200 MCG/50ML IV SOLN
INTRAVENOUS | Status: AC
  Filled 2021-01-02: qty 50

## 2021-01-02 MED ORDER — PROPOFOL 500 MG/50ML IV EMUL
INTRAVENOUS | Status: DC | PRN
  Administered 2021-01-02: 140 ug/kg/min via INTRAVENOUS

## 2021-01-02 MED ORDER — PROPOFOL 10 MG/ML IV BOLUS
INTRAVENOUS | Status: DC | PRN
  Administered 2021-01-02: 70 mg via INTRAVENOUS

## 2021-01-02 MED ORDER — PROPOFOL 500 MG/50ML IV EMUL
INTRAVENOUS | Status: AC
  Filled 2021-01-02: qty 50

## 2021-01-02 MED ORDER — PHENYLEPHRINE HCL (PRESSORS) 10 MG/ML IV SOLN
INTRAVENOUS | Status: DC | PRN
Start: 2021-01-02 — End: 2021-01-02
  Administered 2021-01-02: 100 ug via INTRAVENOUS
  Administered 2021-01-02: 200 ug via INTRAVENOUS

## 2021-01-02 NOTE — Anesthesia Preprocedure Evaluation (Signed)
Anesthesia Evaluation  Patient identified by MRN, date of birth, ID band Patient awake    Reviewed: Allergy & Precautions, NPO status , Patient's Chart, lab work & pertinent test results  History of Anesthesia Complications (+) PONV and history of anesthetic complications  Airway Mallampati: III  TM Distance: >3 FB Neck ROM: Full    Dental  (+) Edentulous Upper, Edentulous Lower   Pulmonary neg sleep apnea, neg COPD, former smoker,    breath sounds clear to auscultation- rhonchi (-) wheezing      Cardiovascular Exercise Tolerance: Poor hypertension, Pt. on medications (-) CAD and (-) Past MI  Rhythm:Regular Rate:Normal - Systolic murmurs and - Diastolic murmurs    Neuro/Psych  Neuromuscular disease (Reflex sympathetic dystrophy) negative psych ROS   GI/Hepatic Neg liver ROS, GERD  ,  Endo/Other  diabetes (A1c 6.2), Well Controlled, Type 2  Renal/GU CRFRenal disease     Musculoskeletal  (+) Arthritis ,   Abdominal (+) - obese,   Peds  Hematology negative hematology ROS (+)   Anesthesia Other Findings Past Medical History: No date: Arthritis 03/13/2016: Cancer ([place])     Comment: INVASIVE MAMMARY CARCINOMA No date: Chickenpox No date: Complication of anesthesia No date: Frequent UTI No date: GERD (gastroesophageal reflux disease)     Comment: RARE No date: Hypertension No date: PONV (postoperative nausea and vomiting) No date: RSD (reflex sympathetic dystrophy) No date: Seasonal allergies No date: Skin cancer No date: Urine incontinence   Reproductive/Obstetrics negative OB ROS                           Anesthesia Physical  Anesthesia Plan  ASA: III  Anesthesia Plan: General   Post-op Pain Management:    Induction: Intravenous  PONV Risk Score and Plan: 2 and 3 and Treatment may vary due to age or medical condition and TIVA  Airway Management Planned: Nasal Cannula and  Natural Airway  Additional Equipment:   Intra-op Plan:   Post-operative Plan:   Informed Consent: I have reviewed the patients History and Physical, chart, labs and discussed the procedure including the risks, benefits and alternatives for the proposed anesthesia with the patient or authorized representative who has indicated his/her understanding and acceptance.     Dental advisory given  Plan Discussed with: CRNA and Anesthesiologist  Anesthesia Plan Comments:        Anesthesia Quick Evaluation

## 2021-01-02 NOTE — H&P (Signed)
Catherine Tate [id number] [birth year]     HPI:  80 y/o with a positive cologuard test. For colonoscopy.   Medications Prior to Admission  Medication Sig Dispense Refill Last Dose   acetaminophen (TYLENOL) 500 MG tablet Take 1,000 mg by mouth every 6 (six) hours as needed for moderate pain.    01/01/2021   Ascorbic Acid (VITAMIN C) 1000 MG tablet Take 1,000 mg by mouth daily.   01/01/2021   atorvastatin (LIPITOR) 10 MG tablet TAKE 1 TABLET BY MOUTH ONCE A DAY FOR CHOLSTEROL. 90 tablet 3 01/01/2021   Calcium Carb-Cholecalciferol (CALCIUM/VITAMIN D PO) Take 1,200 mg by mouth.   01/01/2021   CRANBERRY PO Take 1 capsule by mouth 2 (two) times daily.   01/01/2021   hydrochlorothiazide (HYDRODIURIL) 25 MG tablet TAKE 1 TABLET BY MOUTH ONCE DAILY for blood pressure. 90 tablet 1 01/01/2021   letrozole (FEMARA) 2.5 MG tablet TAKE 1 TABLET BY MOUTH ONCE DAILY 90 tablet 3 01/01/2021   losartan (COZAAR) 50 MG tablet Take 1 tablet (50 mg total) by mouth daily. For blood pressure. Office visit required in November for further refills. 90 tablet 0 01/01/2021   Probiotic Product (PROBIOTIC PO) Take 1 tablet by mouth daily.   01/01/2021   Allergies  Allergen Reactions   Bee Pollen Hives   Mosquito (Diagnostic) Hives   Other Other (See Comments)    Uncoded Allergy. Allergen: VICRYL  (90% glycolide and 10% L-lactide [polyglactin-- synthetic absorbable sterile surgical suture]), Other Reaction: Infection   Gabapentin Swelling   Mosquito (Culex Pipiens) Allergy Skin Test Hives   Norvasc [Amlodipine Besylate] Swelling    TO FEET   Lisinopril Cough   Past Medical History:  Diagnosis Date   Arthritis    Cancer ([place]) 03/13/2016   INVASIVE MAMMARY CARCINOMA   Cataract 02/03/2018   Chickenpox    Complication of anesthesia    Frequent UTI    GERD (gastroesophageal reflux disease)    RARE   Hypertension    Personal history of chemotherapy 2017/2018   F/U left breast cancer   Personal history of radiation therapy  2018   F/U left breast cancer   PONV (postoperative nausea and vomiting)    RSD (reflex sympathetic dystrophy)    Seasonal allergies    Skin cancer    Urine incontinence    Past Surgical History:  Procedure Laterality Date   ABDOMINAL HYSTERECTOMY  2005   total/ Dr Davis Gourd   BLADDER TUMOR EXCISION  2006   BREAST BIOPSY Left 03/13/2016   INVASIVE MAMMARY CARCINOMA   BREAST LUMPECTOMY Left 2018   BREAST LUMPECTOMY WITH SENTINEL LYMPH NODE BIOPSY Left 07/14/2016   Procedure: BREAST LUMPECTOMY WITH SENTINEL LYMPH NODE BX;  Surgeon: [name], MD;  Location: [facility] ORS;  Service: General;  Laterality: Left;   denture surgery     OOPHORECTOMY     PORTACATH PLACEMENT N/A 04/01/2016   Procedure: INSERTION PORT-A-CATH;  Surgeon: [name], MD;  Location: [facility] ORS;  Service: General;  Laterality: N/A;   SKIN CANCER EXCISION Right 2015   foot/ Dr [place] Crane   TUBAL LIGATION     TUMOR EXCISION Right    FOOT   Social History   Socioeconomic History   Marital status: Single    Spouse name: Not on file   Number of children: Not on file   Years of education: Not on file   Highest education level: Not on file  Occupational History   Not on file  Tobacco Use  Smoking status: Former    Packs/day: 1.00    Years: 20.00    Pack years: 20.00    Types: Cigarettes    Quit date: 04/14/1994    Years since quitting: 26.7   Smokeless tobacco: Never  Vaping Use   Vaping Use: Never used  Substance and Sexual Activity   Alcohol use: No    Alcohol/week: 0.0 standard drinks   Drug use: No   Sexual activity: Not Currently  Other Topics Concern   Not on file  Social History Narrative   Single.   2 children, 1 grandchild.   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   Enjoys making jewelry, puzzle books, sewing, traveling.   Social Determinants of Health   Financial Resource Strain: Low Risk    Difficulty of Paying Living Expenses: Not hard at all  Food Insecurity: No Food  Insecurity   Worried About Charity fundraiser in the Last Year: Never true   [place] in the Last Year: Never true  Transportation Needs: No Transportation Needs   Lack of Transportation (Medical): No   Lack of Transportation (Non-Medical): No  Physical Activity: Insufficiently Active   Days of Exercise per Week: 3 days   Minutes of Exercise per Session: 30 min  Stress: No Stress Concern Present   Feeling of Stress : Not at all  Social Connections: Not on file  Intimate Partner Violence: Not At Risk   Fear of Current or Ex-Partner: No   Emotionally Abused: No   Physically Abused: No   Sexually Abused: No   Social History   Social History Narrative   Single.   2 children, 1 grandchild.   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   Enjoys making jewelry, puzzle books, sewing, traveling.     ROS: Negative.     PE: HEENT: Negative. Lungs: Clear. Cardio: RR.   Assessment/Plan:  Proceed with planned endoscopy.    Catherine Tate [facility] 01/02/2021

## 2021-01-02 NOTE — Op Note (Signed)
[facility] Patient Name: Catherine Tate Procedure Date: 01/02/2021 8:59 AM MRN: [id number] Account #: [id number] Date of Birth: [birth year] Admit Type: Outpatient Age: 80 Room: [facility] ENDO ROOM 1 Gender: Female Note Status: Finalized Instrument Name: Peds Colonoscope [id number] Procedure:             Colonoscopy Indications:           Positive Cologuard test Providers:             [name], MD Referring MD:          [name] (Referring MD) Medicines:             Propofol per Anesthesia Complications:         No immediate complications. Procedure:             Pre-Anesthesia Assessment:                        - Prior to the procedure, a History and Physical was                         performed, and patient medications, allergies and                         sensitivities were reviewed. The patient's tolerance                         of previous anesthesia was reviewed.                        - The risks and benefits of the procedure and the                         sedation options and risks were discussed with the                         patient. All questions were answered and informed                         consent was obtained.                        After obtaining informed consent, the colonoscope was                         passed under direct vision. Throughout the procedure,                         the patient's blood pressure, pulse, and oxygen                         saturations were monitored continuously. The                         Colonoscope was introduced through the anus and                         advanced to the the cecum, identified by appendiceal  orifice and ileocecal valve. The colonoscopy was                         performed without difficulty. The patient tolerated                         the procedure well. The quality of the bowel                         preparation was  excellent. Findings:      A 10 mm polyp was found in the proximal transverse colon. The polyp was       sessile. The polyp was removed with a hot snare. Resection and retrieval       were complete.      A 10 mm polyp was found in the sigmoid colon. The polyp was       pedunculated. The polyp was removed with a hot snare. Resection and       retrieval were complete.      The retroflexed view of the distal rectum and anal verge was normal and       showed no anal or rectal abnormalities.      Multiple small-mouthed diverticula were found in the recto-sigmoid       colon, sigmoid colon and ascending colon. Impression:            - One 10 mm polyp in the proximal transverse colon,                         removed with a hot snare. Resected and retrieved.                        - One 10 mm polyp in the sigmoid colon, removed with a                         hot snare. Resected and retrieved.                        - The distal rectum and anal verge are normal on                         retroflexion view.                        - Diverticulosis in the recto-sigmoid colon, in the                         sigmoid colon and in the ascending colon. Recommendation:        - Telephone endoscopist for pathology results in 1                         week. Procedure Code(s):     --- Professional ---                        [phone], Colonoscopy, flexible; with removal of                         tumor(s), polyp(s), or other lesion(s) by snare  technique Diagnosis Code(s):     --- Professional ---                        R19.5, Other fecal abnormalities                        K57.30, Diverticulosis of large intestine without                         perforation or abscess without bleeding                        K63.5, Polyp of colon CPT copyright 2019 American Medical Association. All rights reserved. The codes documented in this report are preliminary and upon coder review may  be  revised to meet current compliance requirements. [name], MD 01/02/2021 9:36:48 AM This report has been signed electronically. Number of Addenda: 0 Note Initiated On: 01/02/2021 8:59 AM Scope Withdrawal Time: 0 hours 15 minutes 18 seconds  Total Procedure Duration: 0 hours 22 minutes 36 seconds  Estimated Blood Loss:  Estimated blood loss: none.      [facility]

## 2021-01-02 NOTE — Transfer of Care (Signed)
Immediate Anesthesia Transfer of Care Note  Patient: Catherine Tate  Procedure(s) Performed: COLONOSCOPY WITH PROPOFOL  Patient Location: PACU  Anesthesia Type:General  Level of Consciousness: awake, alert  and oriented  Airway & Oxygen Therapy: Patient Spontanous Breathing  Post-op Assessment: Report given to RN and Post -op Vital signs reviewed and stable  Post vital signs: Reviewed and stable  Last Vitals:  Vitals Value Taken Time  BP 134/51 01/02/21 0948  Temp 35.6 C 01/02/21 0938  Pulse 87 01/02/21 0955  Resp 15 01/02/21 0955  SpO2 98 % 01/02/21 0955  Vitals shown include unvalidated device data.  Last Pain:  Vitals:   01/02/21 0948  TempSrc:   PainSc: 0-No pain         Complications: No notable events documented.

## 2021-01-03 ENCOUNTER — Encounter: Payer: Self-pay | Admitting: General Surgery

## 2021-01-03 LAB — SURGICAL PATHOLOGY

## 2021-01-03 NOTE — Anesthesia Postprocedure Evaluation (Signed)
Anesthesia Post Note  Patient: Catherine Tate  Procedure(s) Performed: COLONOSCOPY WITH PROPOFOL  Patient location during evaluation: Endoscopy Anesthesia Type: General Level of consciousness: awake and alert Pain management: pain level controlled Vital Signs Assessment: post-procedure vital signs reviewed and stable Respiratory status: spontaneous breathing, nonlabored ventilation and respiratory function stable Cardiovascular status: blood pressure returned to baseline and stable Postop Assessment: no apparent nausea or vomiting Anesthetic complications: no   No notable events documented.   Last Vitals:  Vitals:   01/02/21 0948 01/02/21 0958  BP: (!) 134/51 139/63  Pulse: 90 84  Resp: 20 14  Temp:    SpO2: 98% 100%    Last Pain:  Vitals:   01/03/21 0746  TempSrc:   PainSc: 0-No pain                 [place] Ouch

## 2021-01-27 NOTE — Progress Notes (Signed)
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  INTERVAL HISTORY: Catherine Tate is a 80 yo female with pmh signifiacnt for HTN, T2DM, Osteoporosis, CKD stage III, HLD and breast cancer who is followed by Dr. Grayland Ormond. She is here for her routine 6 month evaluation. She has been stable and tolerating letrozole. No new SE. she stopped Fosamax and is currently on calcium and vitamin D only.  She had a positive Cologuard test prompting a colonoscopy which she had on 01/02/2021.  Colonoscopy showed several 10 mm polyps and diverticulosis in sigmoid colon.  Pathology showed tubular adenoma but was negative for high-grade dysplasia or malignancy.  In the interim, she had a bone density and a mammogram.  She is here today to review results.  She reports overall doing well.  Denies any falls.  REVIEW OF SYSTEMS:   Review of Systems  All other systems reviewed and are negative.   As per HPI. Otherwise, a complete review of systems is negative.  PAST MEDICAL HISTORY: Past Medical History:  Diagnosis Date   Arthritis    Cancer ([place]) 03/13/2016   INVASIVE MAMMARY CARCINOMA   Cataract 02/03/2018   Chickenpox    Complication of anesthesia    Frequent UTI    GERD (gastroesophageal reflux disease)    RARE   Hypertension    Personal history of chemotherapy 2017/2018   F/U left breast cancer   Personal history of radiation therapy 2018   F/U left breast cancer   PONV (postoperative nausea and vomiting)    RSD (reflex  sympathetic dystrophy)    Seasonal allergies    Skin cancer    Urine incontinence     PAST SURGICAL HISTORY: Past Surgical History:  Procedure Laterality Date   ABDOMINAL HYSTERECTOMY  2005   total/ Dr Davis Gourd   BLADDER TUMOR EXCISION  2006   BREAST BIOPSY Left 03/13/2016   INVASIVE MAMMARY CARCINOMA   BREAST LUMPECTOMY Left 2018   BREAST LUMPECTOMY WITH SENTINEL LYMPH NODE BIOPSY Left 07/14/2016   Procedure: BREAST LUMPECTOMY WITH SENTINEL LYMPH NODE BX;  Surgeon: [name], MD;  Location: [facility];  Service: General;  Laterality: Left;   COLONOSCOPY WITH PROPOFOL N/A 01/02/2021   Procedure: COLONOSCOPY WITH PROPOFOL;  Surgeon: [name], MD;  Location: [facility];  Service: Endoscopy;  Laterality: N/A;   denture surgery     OOPHORECTOMY     PORTACATH PLACEMENT N/A 04/01/2016   Procedure: INSERTION PORT-A-CATH;  Surgeon: [name], MD;  Location: [facility];  Service: General;  Laterality: N/A;   SKIN CANCER EXCISION Right 2015   foot/ Dr [place] Crane   TUBAL LIGATION     TUMOR EXCISION Right    FOOT    FAMILY HISTORY: Family History  Problem Relation Age of Onset   Emphysema Father    Heart disease Mother    Transient ischemic attack Mother    Breast cancer Cousin        paternal   Kidney cancer Neg Hx    Bladder Cancer Neg Hx     ADVANCED DIRECTIVES (Y/N):  N  HEALTH MAINTENANCE: Social History   Tobacco Use   Smoking status: Former    Packs/day: 1.00    Years: 20.00    Pack years: 20.00    Types: Cigarettes    Quit date: 04/14/1994    Years since quitting: 26.8   Smokeless tobacco: Never  Vaping Use   Vaping Use: Never used  Substance Use Topics   Alcohol use: No    Alcohol/week: 0.0 standard drinks   Drug use: No     Colonoscopy:  PAP:  Bone density:  Lipid panel:  Allergies  Allergen Reactions   Bee Pollen Hives   Mosquito (Diagnostic) Hives   Other Other (See Comments)    Uncoded Allergy. Allergen: VICRYL  (90%  glycolide and 10% L-lactide [polyglactin-- synthetic absorbable sterile surgical suture]), Other Reaction: Infection   Gabapentin Swelling   Mosquito (Culex Pipiens) Allergy Skin Test Hives   Norvasc [Amlodipine Besylate] Swelling    TO FEET   Lisinopril Cough    Current Outpatient Medications  Medication Sig Dispense Refill   acetaminophen (TYLENOL) 500 MG tablet Take 1,000 mg by mouth every 6 (six) hours as needed for moderate pain.      alendronate (FOSAMAX) 70 MG tablet Take 1 tablet (70 mg total) by mouth once a week. Take with a full glass of water on an empty stomach. 90 tablet 2   Ascorbic Acid (VITAMIN C) 1000 MG tablet Take 1,000 mg by mouth daily.     atorvastatin (LIPITOR) 10 MG tablet TAKE 1 TABLET BY MOUTH ONCE A DAY FOR CHOLSTEROL. 90 tablet 3   Calcium Carb-Cholecalciferol (CALCIUM/VITAMIN D PO) Take 1,200 mg by mouth.     CRANBERRY PO Take 1 capsule by mouth 2 (two) times daily.     hydrochlorothiazide (HYDRODIURIL) 25 MG tablet TAKE 1 TABLET BY MOUTH ONCE DAILY for blood pressure. 90 tablet 1   letrozole (FEMARA) 2.5 MG tablet TAKE 1 TABLET BY MOUTH ONCE DAILY 90 tablet 3   losartan (COZAAR) 50 MG tablet Take 1 tablet (50 mg total) by mouth daily. For blood pressure. Office visit required in November for further refills. 90 tablet 0   Probiotic Product (PROBIOTIC PO) Take 1 tablet by mouth daily.     No current facility-administered medications for this visit.    OBJECTIVE: Vitals:   01/28/21 1010  BP: 130/80  Pulse: (!) 105  Resp: 16  Temp: (!) 97.5 F (36.4 C)     Body mass index is 26.43 kg/m.    ECOG FS:0 - Asymptomatic  Physical Exam Constitutional:      Appearance: Normal appearance.  HENT:     Head: Normocephalic and atraumatic.  Eyes:     Pupils: Pupils are equal, round, and reactive to light.  Cardiovascular:     Rate and Rhythm: Normal rate and regular rhythm.     Heart sounds: Normal heart sounds. No murmur heard. Pulmonary:     Effort:  Pulmonary effort is normal.     Breath sounds: Normal breath sounds. No wheezing.  Abdominal:     General: Bowel sounds are normal. There is no distension.     Palpations: Abdomen is soft.     Tenderness: There is no abdominal tenderness.  Musculoskeletal:        General: Normal range of motion.     Cervical back: Normal range of motion.  Skin:    General: Skin is warm and dry.     Findings: No rash.  Neurological:  Mental Status: She is alert and oriented to person, place, and time.  Psychiatric:        Judgment: Judgment normal.     LAB RESULTS:  Lab Results  Component Value Date   NA 137 02/17/2020   K 3.8 02/17/2020   CL 101 02/17/2020   CO2 27 02/17/2020   GLUCOSE 111 (H) 02/17/2020   BUN 16 02/17/2020   CREATININE 1.12 02/17/2020   CALCIUM 9.3 02/17/2020   PROT 7.1 02/17/2020   ALBUMIN 4.3 02/17/2020   AST 20 02/17/2020   ALT 18 02/17/2020   ALKPHOS 45 02/17/2020   BILITOT 0.7 02/17/2020   GFRNONAA 50 (L) 12/22/2016   GFRAA 58 (L) 12/22/2016    Lab Results  Component Value Date   WBC 7.1 02/09/2019   NEUTROABS 5.3 02/03/2018   HGB 12.0 02/09/2019   HCT 35.7 (L) 02/09/2019   MCV 90.4 02/09/2019   PLT 243.0 02/09/2019     STUDIES: No results found.  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  PLAN:    1. 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  She then underwent adjuvant XRT.  She continues to tolerate letrozole well.  She is on letrozole and will complete 5 years of treatment in June 2023. Most recent mammogram from 11/21/20 was negative.  Repeat in August 2023.  Return to clinic in 6 months for follow-up and to see Dr. Grayland Ormond.  2. Osteopenia Bone density scan from 11/21/20 showed a T-score of -2.3 (-1.8)  which is  slightly worse from last year. She is not on fosamax.  She is taking calcium and vitamin D.  Recommend restarting Fosamax given she tolerated this well.  Patient is in agreement.  I spent 20 minutes dedicated to the care of this patient (face-to-face and non-face-to-face) on the date of the encounter to include what is described in the assessment and plan.  Patient expressed understanding and was in agreement with this plan. She also understands that She can call clinic at any time with any questionncerns, or complaints.    Cancer Staging Malignant neoplasm of upper-outer quadrant of left female breast [facility]) Staging form: Breast, AJCC 7th Edition - Clinical stage from 03/19/2016: Stage IIA (T2, N0, M0) - Signed by Lloyd Huger, MD on 04/06/2016 Laterality: Right Estrogen receptor status: Positive Progesterone receptor status: Positive HER2 status: Negative - Pathologic stage from 07/27/2016: Stage IIA (T2, N0, cM0) - Signed by Lloyd Huger, MD on 07/27/2016 Laterality: Left Estrogen receptor status: Positive Progesterone receptor status: Positive HER2 status: Negative    Jacquelin Hawking, NP 01/28/21 11:46 AM

## 2021-01-28 ENCOUNTER — Other Ambulatory Visit: Payer: Self-pay

## 2021-01-28 ENCOUNTER — Encounter: Payer: Self-pay | Admitting: Oncology

## 2021-01-28 ENCOUNTER — Inpatient Hospital Stay: Payer: PPO | Attending: Oncology | Admitting: Oncology

## 2021-01-28 VITALS — BP 130/80 | HR 105 | Temp 97.5°F | Resp 16 | Wt 158.8 lb

## 2021-01-28 DIAGNOSIS — Z170 Estrogen receptor positive status [ER+]: Secondary | ICD-10-CM | POA: Diagnosis not present

## 2021-01-28 DIAGNOSIS — E1122 Type 2 diabetes mellitus with diabetic chronic kidney disease: Secondary | ICD-10-CM | POA: Insufficient documentation

## 2021-01-28 DIAGNOSIS — Z79899 Other long term (current) drug therapy: Secondary | ICD-10-CM | POA: Insufficient documentation

## 2021-01-28 DIAGNOSIS — M810 Age-related osteoporosis without current pathological fracture: Secondary | ICD-10-CM | POA: Diagnosis not present

## 2021-01-28 DIAGNOSIS — C50412 Malignant neoplasm of upper-outer quadrant of left female breast: Secondary | ICD-10-CM | POA: Insufficient documentation

## 2021-01-28 DIAGNOSIS — Z9221 Personal history of antineoplastic chemotherapy: Secondary | ICD-10-CM | POA: Diagnosis not present

## 2021-01-28 DIAGNOSIS — Z79811 Long term (current) use of aromatase inhibitors: Secondary | ICD-10-CM | POA: Diagnosis not present

## 2021-01-28 DIAGNOSIS — Z923 Personal history of irradiation: Secondary | ICD-10-CM | POA: Diagnosis not present

## 2021-01-28 DIAGNOSIS — N183 Chronic kidney disease, stage 3 unspecified: Secondary | ICD-10-CM | POA: Insufficient documentation

## 2021-01-28 DIAGNOSIS — Z87891 Personal history of nicotine dependence: Secondary | ICD-10-CM | POA: Diagnosis not present

## 2021-01-28 DIAGNOSIS — I129 Hypertensive chronic kidney disease with stage 1 through stage 4 chronic kidney disease, or unspecified chronic kidney disease: Secondary | ICD-10-CM | POA: Diagnosis not present

## 2021-01-28 MED ORDER — ALENDRONATE SODIUM 70 MG PO TABS: 70.0000 mg | ORAL_TABLET | ORAL | 2 refills | Status: DC

## 2021-01-28 NOTE — Progress Notes (Signed)
Patient denies new problems/concerns today.   °

## 2021-02-19 ENCOUNTER — Ambulatory Visit: Payer: PPO

## 2021-02-22 ENCOUNTER — Encounter: Payer: PPO | Admitting: Primary Care

## 2021-03-19 ENCOUNTER — Other Ambulatory Visit: Payer: Self-pay | Admitting: Primary Care

## 2021-03-19 DIAGNOSIS — I10 Essential (primary) hypertension: Secondary | ICD-10-CM

## 2021-03-19 DIAGNOSIS — E785 Hyperlipidemia, unspecified: Secondary | ICD-10-CM

## 2021-04-14 HISTORY — PX: COLONOSCOPY: SHX174

## 2021-04-16 ENCOUNTER — Telehealth: Payer: Self-pay | Admitting: Primary Care

## 2021-04-16 DIAGNOSIS — E785 Hyperlipidemia, unspecified: Secondary | ICD-10-CM

## 2021-04-16 DIAGNOSIS — I10 Essential (primary) hypertension: Secondary | ICD-10-CM

## 2021-04-16 MED ORDER — ATORVASTATIN CALCIUM 10 MG PO TABS: 10.0000 mg | ORAL_TABLET | Freq: Every day | ORAL | 0 refills | Status: DC

## 2021-04-16 MED ORDER — HYDROCHLOROTHIAZIDE 25 MG PO TABS: ORAL_TABLET | ORAL | 0 refills | Status: DC

## 2021-04-16 MED ORDER — LOSARTAN POTASSIUM 50 MG PO TABS: 50.0000 mg | ORAL_TABLET | Freq: Every day | ORAL | 0 refills | Status: DC

## 2021-04-16 NOTE — Telephone Encounter (Signed)
Mrs. Catherine Tate wanted to know if she can get a 3 month suppky due to its costing more for a month to month   Encourage patient to contact the pharmacy for refills or they can request refills through [place]:  Please schedule appointment if longer than 1 year  NEXT APPOINTMENT DATE: 06/06/21  MEDICATION: atorvastatin (LIPITOR) 10 MG tablet hydrochlorothiazide (HYDRODIURIL) 25 MG tablet losartan (COZAAR) 50 MG tablet    Is the patient out of medication? Yes-   PHARMACY: [facility]  Let patient know to contact pharmacy at the end of the day to make sure medication is ready.  Please notify patient to allow 48-72 hours to process  CLINICAL FILLS OUT ALL BELOW:   LAST REFILL:  QTY:  REFILL DATE:    OTHER COMMENTS:    Okay for refill?  Please advise

## 2021-04-16 NOTE — Telephone Encounter (Signed)
Number busy

## 2021-04-16 NOTE — Telephone Encounter (Signed)
Please notify patient that I've been sending 30 day supply as she is overdue for CPE/follow up.  I see that she is scheduled for next month. Please kindly notify patient that she MUST keep this appointment for further refills.   Will send a 90 day supply for each requested medication.

## 2021-06-06 ENCOUNTER — Ambulatory Visit (INDEPENDENT_AMBULATORY_CARE_PROVIDER_SITE_OTHER): Payer: PPO | Admitting: Primary Care

## 2021-06-06 ENCOUNTER — Encounter: Payer: Self-pay | Admitting: Primary Care

## 2021-06-06 ENCOUNTER — Other Ambulatory Visit: Payer: Self-pay

## 2021-06-06 VITALS — BP 124/62 | HR 96 | Temp 97.8°F | Ht 65.0 in | Wt 155.0 lb

## 2021-06-06 DIAGNOSIS — E1165 Type 2 diabetes mellitus with hyperglycemia: Secondary | ICD-10-CM

## 2021-06-06 DIAGNOSIS — Z853 Personal history of malignant neoplasm of breast: Secondary | ICD-10-CM

## 2021-06-06 DIAGNOSIS — M810 Age-related osteoporosis without current pathological fracture: Secondary | ICD-10-CM | POA: Diagnosis not present

## 2021-06-06 DIAGNOSIS — Z0000 Encounter for general adult medical examination without abnormal findings: Secondary | ICD-10-CM

## 2021-06-06 DIAGNOSIS — N390 Urinary tract infection, site not specified: Secondary | ICD-10-CM

## 2021-06-06 DIAGNOSIS — Z0001 Encounter for general adult medical examination with abnormal findings: Secondary | ICD-10-CM

## 2021-06-06 DIAGNOSIS — N1831 Chronic kidney disease, stage 3a: Secondary | ICD-10-CM | POA: Diagnosis not present

## 2021-06-06 DIAGNOSIS — E785 Hyperlipidemia, unspecified: Secondary | ICD-10-CM | POA: Diagnosis not present

## 2021-06-06 DIAGNOSIS — R300 Dysuria: Secondary | ICD-10-CM | POA: Diagnosis not present

## 2021-06-06 DIAGNOSIS — I10 Essential (primary) hypertension: Secondary | ICD-10-CM

## 2021-06-06 LAB — CBC
HCT: 36.2 % (ref 36.0–46.0)
Hemoglobin: 12.3 g/dL (ref 12.0–15.0)
MCHC: 33.9 g/dL (ref 30.0–36.0)
MCV: 91.7 fl (ref 78.0–100.0)
Platelets: 262 10*3/uL (ref 150.0–400.0)
RBC: 3.95 Mil/uL (ref 3.87–5.11)
RDW: 12.8 % (ref 11.5–15.5)
WBC: 6.5 10*3/uL (ref 4.0–10.5)

## 2021-06-06 LAB — COMPREHENSIVE METABOLIC PANEL
ALT: 19 U/L (ref 0–35)
AST: 21 U/L (ref 0–37)
Albumin: 4.4 g/dL (ref 3.5–5.2)
Alkaline Phosphatase: 48 U/L (ref 39–117)
BUN: 17 mg/dL (ref 6–23)
CO2: 31 mEq/L (ref 19–32)
Calcium: 9.8 mg/dL (ref 8.4–10.5)
Chloride: 101 mEq/L (ref 96–112)
Creatinine, Ser: 1.08 mg/dL (ref 0.40–1.20)
GFR: 48.91 mL/min — ABNORMAL LOW (ref >60.00)
Glucose, Bld: 105 mg/dL — ABNORMAL HIGH (ref 70–99)
Potassium: 4.5 mEq/L (ref 3.5–5.1)
Sodium: 137 mEq/L (ref 135–145)
Total Bilirubin: 0.6 mg/dL (ref 0.2–1.2)
Total Protein: 7.2 g/dL (ref 6.0–8.3)

## 2021-06-06 LAB — LIPID PANEL
Cholesterol: 143 mg/dL (ref 0–200)
HDL: 41.3 mg/dL (ref >39.00)
LDL Cholesterol: 69 mg/dL (ref 0–99)
NonHDL: 101.88
Total CHOL/HDL Ratio: 3
Triglycerides: 166 mg/dL — ABNORMAL HIGH (ref 0.0–149.0)
VLDL: 33.2 mg/dL (ref 0.0–40.0)

## 2021-06-06 LAB — POC URINALSYSI DIPSTICK (AUTOMATED)
Bilirubin, UA: NEGATIVE
Blood, UA: NEGATIVE
Glucose, UA: NEGATIVE
Ketones, UA: NEGATIVE
Nitrite, UA: NEGATIVE
Protein, UA: NEGATIVE
Spec Grav, UA: 1.015 (ref 1.010–1.025)
Urobilinogen, UA: 0.2 E.U./dL
pH, UA: 6.5 (ref 5.0–8.0)

## 2021-06-06 LAB — HEMOGLOBIN A1C: Hgb A1c MFr Bld: 6.6 % — ABNORMAL HIGH (ref 4.6–6.5)

## 2021-06-06 NOTE — Assessment & Plan Note (Signed)
Immunizations up-to-date Mammogram up-to-date Bone density scan up-to-date. Colonoscopy up-to-date, due 2027  Courage to healthy diet regular exercise.  Exam today as noted. Labs pending.

## 2021-06-06 NOTE — Assessment & Plan Note (Signed)
Repeat renal function pending.  Continue losartan for blood pressure control and renal function.

## 2021-06-06 NOTE — Assessment & Plan Note (Signed)
Following with oncology.  Continue letrozole 2.5 mg daily. Mammogram up-to-date.

## 2021-06-06 NOTE — Assessment & Plan Note (Signed)
Acute on chronic.  UA today with 3+ leuks, otherwise negative. Culture sent today.  Await treatment given her chronic dysuria and normal presentation today

## 2021-06-06 NOTE — Assessment & Plan Note (Signed)
Increase symptoms recently.  UA today with 3+ leuks, otherwise negative. Culture sent.  Await results prior to treatment as she appears well and has chronic dysuria at baseline

## 2021-06-06 NOTE — Assessment & Plan Note (Signed)
Controlled.  Continue losartan 50 mg daily, HCTZ 25 mg.   CMP pending.

## 2021-06-06 NOTE — Progress Notes (Signed)
Subjective:    Patient ID: Catherine Tate, female    DOB: November 04, 1940, 81 y.o.   MRN: [id number]  HPI  Catherine Tate is a very pleasant 81 y.o. female who presents today for complete physical and follow up of chronic conditions.  She would also like to discuss dysuria. Chronic dysuria for years, comes and goes, recently returned yesterday. She is taking cranberry pills once daily. She denies hematuria, urinary frequency, abdominal pain, fevers  Immunizations: -Tetanus: 2012 -Influenza: Completed the season -Covid-19: Completed several doses -Shingles: Completed Zostavax in 2011 -Pneumonia: Prevnar 13 in 2017, Pneumovax 23 in 2018  Diet: [place].  Exercise: No regular exercise.  Eye exam: Completes annually  Dental exam: Completes semi-annually   Mammogram: Completed in August 2022 Dexa: Completed in August 2022 Colonoscopy: Completed in 2022, polyps removed. Due in 2027   BP Readings from Last 3 Encounters:  06/06/21 124/62  01/28/21 130/80  01/02/21 139/63         Review of Systems  Constitutional:  Negative for unexpected weight change.  HENT:  Negative for rhinorrhea.   Respiratory:  Negative for cough and shortness of breath.   Cardiovascular:  Negative for chest pain.  Gastrointestinal:  Negative for constipation and diarrhea.  Genitourinary:  Positive for dysuria. Negative for difficulty urinating.  Musculoskeletal:  Negative for arthralgias and myalgias.  Skin:  Negative for rash.  Allergic/Immunologic: Negative for environmental allergies.  Neurological:  Negative for dizziness and headaches.  Psychiatric/Behavioral:  The patient is not nervous/anxious.         Past Medical History:  Diagnosis Date   Arthritis    Cancer ([place]) 03/13/2016   INVASIVE MAMMARY CARCINOMA   Cataract 02/03/2018   Chickenpox    Complication of anesthesia    Frequent UTI    GERD (gastroesophageal reflux disease)    RARE   Hypertension    Personal history of  chemotherapy 2017/2018   F/U left breast cancer   Personal history of radiation therapy 2018   F/U left breast cancer   PONV (postoperative nausea and vomiting)    RSD (reflex sympathetic dystrophy)    Seasonal allergies    Skin cancer    Urine incontinence     Social History   Socioeconomic History   Marital status: Single    Spouse name: Not on file   Number of children: Not on file   Years of education: Not on file   Highest education level: Not on file  Occupational History   Not on file  Tobacco Use   Smoking status: Former    Packs/day: 1.00    Years: 20.00    Pack years: 20.00    Types: Cigarettes    Quit date: 04/14/1994    Years since quitting: 27.1   Smokeless tobacco: Never  Vaping Use   Vaping Use: Never used  Substance and Sexual Activity   Alcohol use: No    Alcohol/week: 0.0 standard drinks   Drug use: No   Sexual activity: Not Currently  Other Topics Concern   Not on file  Social History Narrative   Single.   2 children, 1 grandchild.   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   Enjoys making jewelry, puzzle books, sewing, traveling.   Social Determinants of Health   Financial Resource Strain: Not on file  Food Insecurity: Not on file  Transportation Tate: Not on file  Physical Activity: Not on file  Stress: Not on file  Social Connections: Not on  file  Intimate Partner Violence: Not on file    Past Surgical History:  Procedure Laterality Date   ABDOMINAL HYSTERECTOMY  2005   total/ [facility]   BLADDER TUMOR EXCISION  2006   BREAST BIOPSY Left 03/13/2016   INVASIVE MAMMARY CARCINOMA   BREAST LUMPECTOMY Left 2018   BREAST LUMPECTOMY WITH SENTINEL LYMPH NODE BIOPSY Left 07/14/2016   Procedure: BREAST LUMPECTOMY WITH SENTINEL LYMPH NODE BX;  Surgeon: [name], MD;  Location: [facility];  Service: General;  Laterality: Left;   COLONOSCOPY WITH PROPOFOL N/A 01/02/2021   Procedure: COLONOSCOPY WITH PROPOFOL;  Surgeon: [name], MD;  Location: [facility];  Service: Endoscopy;  Laterality: N/A;   denture surgery     OOPHORECTOMY     PORTACATH PLACEMENT N/A 04/01/2016   Procedure: INSERTION PORT-A-CATH;  Surgeon: [name], MD;  Location: [facility];  Service: General;  Laterality: N/A;   SKIN CANCER EXCISION Right 2015   foot/ Dr [place] Crane   TUBAL LIGATION     TUMOR EXCISION Right    FOOT    Family History  Problem Relation Age of Onset   Emphysema Father    Heart disease Mother    Transient ischemic attack Mother    Breast cancer Cousin        paternal   Kidney cancer Neg Hx    Bladder Cancer Neg Hx     Allergies  Allergen Reactions   Bee Pollen Hives   Mosquito (Diagnostic) Hives   Other Other (See Comments)    Uncoded Allergy. Allergen: VICRYL  (90% glycolide and 10% L-lactide [polyglactin-- synthetic absorbable sterile surgical suture]), Other Reaction: Infection   Gabapentin Swelling   Mosquito (Culex Pipiens) Allergy Skin Test Hives   Norvasc [Amlodipine Besylate] Swelling    TO FEET   Lisinopril Cough    Current Outpatient Medications on File Prior to Visit  Medication Sig Dispense Refill   acetaminophen (TYLENOL) 500 MG tablet Take 1,000 mg by mouth every 6 (six) hours as needed for moderate pain.      alendronate (FOSAMAX) 70 MG tablet Take 1 tablet (70 mg total) by mouth once a week. Take with a full glass of water on an empty stomach. 90 tablet 2   Ascorbic Acid (VITAMIN C) 1000 MG tablet Take 1,000 mg by mouth daily.     atorvastatin (LIPITOR) 10 MG tablet Take 1 tablet (10 mg total) by mouth daily. For cholesterol. Office visit required for further refills. 90 tablet 0   Calcium Carb-Cholecalciferol (CALCIUM/VITAMIN D PO) Take 1,200 mg by mouth.     CRANBERRY PO Take 1 capsule by mouth 2 (two) times daily.     hydrochlorothiazide (HYDRODIURIL) 25 MG tablet TAKE 1 TABLET BY MOUTH ONCE DAILY for blood pressure. Office visit required for further refills. ' 90 tablet 0    letrozole (FEMARA) 2.5 MG tablet TAKE 1 TABLET BY MOUTH ONCE DAILY 90 tablet 3   losartan (COZAAR) 50 MG tablet Take 1 tablet (50 mg total) by mouth daily. For blood pressure. Office visit required for further refills. 90 tablet 0   Probiotic Product (PROBIOTIC PO) Take 1 tablet by mouth daily.     No current facility-administered medications on file prior to visit.    BP 124/62    Pulse 96    Temp 97.8 F (36.6 C) (Oral)    Ht 5\' 5"  (1.651 m)    Wt 155 lb (70.3 kg)    SpO2 96%  BMI 25.79 kg/m  Objective:   Physical Exam HENT:     Right Ear: Tympanic membrane and ear canal normal.     Left Ear: Tympanic membrane and ear canal normal.     Nose: Nose normal.  Eyes:     Conjunctiva/sclera: Conjunctivae normal.     Pupils: Pupils are equal, round, and reactive to light.  Neck:     Thyroid: No thyromegaly.  Cardiovascular:     Rate and Rhythm: Normal rate and regular rhythm.     Heart sounds: No murmur heard. Pulmonary:     Effort: Pulmonary effort is normal.     Breath sounds: Normal breath sounds. No rales.  Abdominal:     General: Bowel sounds are normal.     Palpations: Abdomen is soft.     Tenderness: There is no abdominal tenderness.  Musculoskeletal:        General: Normal range of motion.     Cervical back: Neck supple.  Lymphadenopathy:     Cervical: No cervical adenopathy.  Skin:    General: Skin is warm and dry.     Findings: No rash.  Neurological:     Mental Status: She is alert and oriented to person, place, and time.     Cranial Nerves: No cranial nerve deficit.     Deep Tendon Reflexes: Reflexes are normal and symmetric.  Psychiatric:        Mood and Affect: Mood normal.          Assessment & Plan:      This visit occurred during the SARS-CoV-2 public health emergency.  Safety protocols were in place, including screening questions prior to the visit, additional usage of staff PPE, and extensive cleaning of exam room while observing appropriate  contact time as indicated for disinfecting solutions.

## 2021-06-06 NOTE — Assessment & Plan Note (Signed)
Continue atorvastatin 10 mg daily. Repeat lipid panel pending. 

## 2021-06-06 NOTE — Patient Instructions (Addendum)
Stop by the lab prior to leaving today. I will notify you of your results once received.  ° °It was a pleasure to see you today! ° °Preventive Care 65 Years and Older, Female °Preventive care refers to lifestyle choices and visits with your health care provider that can promote health and wellness. Preventive care visits are also called wellness exams. °What can I expect for my preventive care visit? °Counseling °Your health care provider may ask you questions about your: °Medical history, including: °Past medical problems. °Family medical history. °Pregnancy and menstrual history. °History of falls. °Current health, including: °Memory and ability to understand (cognition). °Emotional well-being. °Home life and relationship well-being. °Sexual activity and sexual health. °Lifestyle, including: °Alcohol, nicotine or tobacco, and drug use. °Access to firearms. °Diet, exercise, and sleep habits. °Work and work environment. °Sunscreen use. °Safety issues such as seatbelt and bike helmet use. °Physical exam °Your health care provider will check your: °Height and weight. These may be used to calculate your BMI (body mass index). BMI is a measurement that tells if you are at a healthy weight. °Waist circumference. This measures the distance around your waistline. This measurement also tells if you are at a healthy weight and may help predict your risk of certain diseases, such as type 2 diabetes and high blood pressure. °Heart rate and blood pressure. °Body temperature. °Skin for abnormal spots. °What immunizations do I need? °Vaccines are usually given at various ages, according to a schedule. Your health care provider will recommend vaccines for you based on your age, medical history, and lifestyle or other factors, such as travel or where you work. °What tests do I need? °Screening °Your health care provider may recommend screening tests for certain conditions. This may include: °Lipid and cholesterol levels. °Hepatitis  C test. °Hepatitis B test. °HIV (human immunodeficiency virus) test. °STI (sexually transmitted infection) testing, if you are at risk. °Lung cancer screening. °Colorectal cancer screening. °Diabetes screening. This is done by checking your blood sugar (glucose) after you have not eaten for a while (fasting). °Mammogram. Talk with your health care provider about how often you should have regular mammograms. °BRCA-related cancer screening. This may be done if you have a family history of breast, ovarian, tubal, or peritoneal cancers. °Bone density scan. This is done to screen for osteoporosis. °Talk with your health care provider about your test results, treatment options, and if necessary, the need for more tests. °Follow these instructions at home: °Eating and drinking ° °Eat a diet that includes fresh fruits and vegetables, whole grains, lean protein, and low-fat dairy products. Limit your intake of foods with high amounts of sugar, saturated fats, and salt. °Take vitamin and mineral supplements as recommended by your health care provider. °Do not drink alcohol if your health care provider tells you not to drink. °If you drink alcohol: °Limit how much you have to 0-1 drink a day. °Know how much alcohol is in your drink. In the [place] one drink equals one 12 oz bottle of beer (355 mL), one 5 oz glass of wine (148 mL), or one 1½ oz glass of hard liquor (44 mL). °Lifestyle °Brush your teeth every morning and night with fluoride toothpaste. Floss one time each day. °Exercise for at least 30 minutes 5 or more days each week. °Do not use any products that contain nicotine or tobacco. These products include cigarettes, chewing tobacco, and vaping devices, such as e-cigarettes. If you need help quitting, ask your health care provider. °Do not use drugs. °  If you are sexually active, practice safe sex. Use a condom or other form of protection in order to prevent STIs. °Take aspirin only as told by your health care provider.  Make sure that you understand how much to take and what form to take. Work with your health care provider to find out whether it is safe and beneficial for you to take aspirin daily. °Ask your health care provider if you need to take a cholesterol-lowering medicine (statin). °Find healthy ways to manage stress, such as: °Meditation, yoga, or listening to music. °Journaling. °Talking to a trusted person. °Spending time with friends and family. °Minimize exposure to UV radiation to reduce your risk of skin cancer. °Safety °Always wear your seat belt while driving or riding in a vehicle. °Do not drive: °If you have been drinking alcohol. Do not ride with someone who has been drinking. °When you are tired or distracted. °While texting. °If you have been using any mind-altering substances or drugs. °Wear a helmet and other protective equipment during sports activities. °If you have firearms in your house, make sure you follow all gun safety procedures. °What's next? °Visit your health care provider once a year for an annual wellness visit. °Ask your health care provider how often you should have your eyes and teeth checked. °Stay up to date on all vaccines. °This information is not intended to replace advice given to you by your health care provider. Make sure you discuss any questions you have with your health care provider. °Document Revised: 09/26/2020 Document Reviewed: 09/26/2020 °Elsevier Patient Education © 2022 [employer] ° °

## 2021-06-06 NOTE — Assessment & Plan Note (Signed)
Not on treatment.  She is diet controlled. Repeat A1C pending.

## 2021-06-06 NOTE — Assessment & Plan Note (Signed)
Reviewed bone density scan from August 2022.  Continue alendronate 70 mg weekly. Continue calcium and vitamin D. Continue walking.

## 2021-06-08 LAB — URINE CULTURE
MICRO NUMBER:: 13048659
SPECIMEN QUALITY:: ADEQUATE

## 2021-06-09 ENCOUNTER — Other Ambulatory Visit: Payer: Self-pay | Admitting: Primary Care

## 2021-06-09 DIAGNOSIS — N3000 Acute cystitis without hematuria: Secondary | ICD-10-CM

## 2021-06-09 MED ORDER — SULFAMETHOXAZOLE-TRIMETHOPRIM 800-160 MG PO TABS
1.0000 | ORAL_TABLET | Freq: Two times a day (BID) | ORAL | 0 refills | Status: AC
Start: 2021-06-09 — End: 2021-06-12

## 2021-07-29 ENCOUNTER — Other Ambulatory Visit: Payer: Self-pay | Admitting: Primary Care

## 2021-07-29 DIAGNOSIS — E785 Hyperlipidemia, unspecified: Secondary | ICD-10-CM

## 2021-07-29 DIAGNOSIS — I10 Essential (primary) hypertension: Secondary | ICD-10-CM

## 2021-07-29 NOTE — Progress Notes (Signed)
?[facility]  ?[phone] [phone] ? ?ID: Catherine Tate OB: [birth year]  MR#: [id number]  QMV#:[id number] ? ?Patient Care Team: ?[name], NP as PCP - General (Internal Medicine) ?[name], MD (General Surgery) ?[name], MD (Inactive) as Consulting Physician (Family Medicine) ?Lloyd Huger, MD as Consulting Physician (Oncology) ? 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 ? ?INTERVAL HISTORY: Patient returns to clinic today for routine 11-monthevaluation.  She currently feels well and is asymptomatic.  She is tolerating letrozole and Fosamax without significant side effects.  She has no neurologic complaints.  She denies any recent fevers or illnesses.  She denies any chest pain, shortness of breath, cough, or hemoptysis.  She denies any nausea, vomiting, constipation, or diarrhea. She has no urinary complaints.  Patient offers no specific complaints today. ? ?REVIEW OF SYSTEMS:   ?Review of Systems  ?Constitutional: Negative.  Negative for fever, malaise/fatigue and weight loss.  ?HENT:  Negative for sinus pain.   ?Respiratory: Negative.  Negative for cough and shortness of breath.   ?Cardiovascular: Negative.  Negative for chest pain and leg swelling.  ?Gastrointestinal: Negative.  Negative for abdominal pain, constipation and nausea.  ?Genitourinary:  Negative for hematuria and urgency.  ?Musculoskeletal: Negative.   ?Skin: Negative.  Negative for rash.  ?Neurological: Negative.  Negative for sensory change, weakness and headaches.  ?Psychiatric/Behavioral: Negative.  The patient is not nervous/anxious and does not have insomnia.    ? ?As per HPI. Otherwise, a complete review of systems is negative. ? ?PAST MEDICAL HISTORY: ?Past Medical History:  ?Diagnosis Date  ? Arthritis   ? Cancer ([place] 03/13/2016  ? INVASIVE MAMMARY CARCINOMA  ? Cataract  02/03/2018  ? Chickenpox   ? Complication of anesthesia   ? Frequent UTI   ? GERD (gastroesophageal reflux disease)   ? RARE  ? Hypertension   ? Personal history of chemotherapy 2017/2018  ? F/U left breast cancer  ? Personal history of radiation therapy 2018  ? F/U left breast cancer  ? PONV (postoperative nausea and vomiting)   ? RSD (reflex sympathetic dystrophy)   ? Seasonal allergies   ? Skin cancer   ? Urine incontinence   ? ? ?PAST SURGICAL HISTORY: ?Past Surgical History:  ?Procedure Laterality Date  ? ABDOMINAL HYSTERECTOMY  2005  ? total/ Dr WDavis Gourd ? BLADDER TUMOR EXCISION  2006  ? BREAST BIOPSY Left 03/13/2016  ? INVASIVE MAMMARY CARCINOMA  ? BREAST LUMPECTOMY Left 2018  ? BREAST LUMPECTOMY WITH SENTINEL LYMPH NODE BIOPSY Left 07/14/2016  ? Procedure: BREAST LUMPECTOMY WITH SENTINEL LYMPH NODE BX;  Surgeon: JRobert Bellow MD;  Location: [facility] OR[facility];  Service: General;  Laterality: Left;  ? COLONOSCOPY WITH PROPOFOL N/A 01/02/2021  ? Procedure: COLONOSCOPY WITH PROPOFOL;  Surgeon: BRobert Bellow MD;  [facility];  Service: Endoscopy;  Laterality: N/A;  ? denture surgery    ? OOPHORECTOMY    ? PORTACATH PLACEMENT N/A 04/01/2016  ? Procedure: INSERTION PORT-A-CATH;  Surgeon: JRobert Bellow MD;  Location: [facility] ORS;  Service: General;  Laterality: N/A;  ? SKIN CANCER EXCISION Right 2015  ? foot/ Dr HN[place] Crane ? TUBAL LIGATION    ? TUMOR EXCISION Right   ? FOOT  ? ? ?FAMILY HISTORY: ?Family History  ?Problem Relation Age of Onset  ? Emphysema Father   ? Heart disease Mother   ? Transient  ischemic attack Mother   ? Breast cancer Cousin   ?     paternal  ? Kidney cancer Neg Hx   ? Bladder Cancer Neg Hx   ? ? ?ADVANCED DIRECTIVES (Y/N):  N ? ?HEALTH MAINTENANCE: ?Social History  ? ?Tobacco Use  ? Smoking status: Former  ?  Packs/day: 1.00  ?  Years: 20.00  ?  Pack years: 20.00  ?  Types: Cigarettes  ?  Quit date: 04/14/1994  ?  Years since quitting: 27.3  ? Smokeless tobacco: Never  ?Vaping Use   ? Vaping Use: Never used  ?Substance Use Topics  ? Alcohol use: No  ?  Alcohol/week: 0.0 standard drinks  ? Drug use: No  ? ? ? Colonoscopy: ? PAP: ? Bone density: ? Lipid panel: ? ?Allergies  ?Allergen Reactions  ? Bee Pollen Hives  ? Mosquito (Diagnostic) Hives  ? Other Other (See Comments)  ?  Uncoded Allergy. Allergen: VICRYL  (90% glycolide and 10% L-lactide [polyglactin-- synthetic absorbable sterile surgical suture]), Other Reaction: Infection  ? Gabapentin Swelling  ? Mosquito (Culex Pipiens) Allergy Skin Test Hives  ? Norvasc [Amlodipine Besylate] Swelling  ?  TO FEET  ? Lisinopril Cough  ? ? ?Current Outpatient Medications  ?Medication Sig Dispense Refill  ? acetaminophen (TYLENOL) 500 MG tablet Take 1,000 mg by mouth every 6 (six) hours as needed for moderate pain.     ? alendronate (FOSAMAX) 70 MG tablet Take 1 tablet (70 mg total) by mouth once a week. Take with a full glass of water on an empty stomach. 90 tablet 2  ? Ascorbic Acid (VITAMIN C) 1000 MG tablet Take 1,000 mg by mouth daily.    ? atorvastatin (LIPITOR) 10 MG tablet TAKE 1 TABLET BY MOUTH ONCE A DAY FOR CHOLSTEROL. 90 tablet 1  ? Calcium Carb-Cholecalciferol (CALCIUM/VITAMIN D PO) Take 1,200 mg by mouth.    ? CRANBERRY PO Take 1 capsule by mouth 2 (two) times daily.    ? hydrochlorothiazide (HYDRODIURIL) 25 MG tablet TAKE 1 TABLET BY MOUTH ONCE DAILY for blood pressure. Office visit required for further refills. ' 90 tablet 0  ? letrozole (FEMARA) 2.5 MG tablet TAKE 1 TABLET BY MOUTH ONCE DAILY 90 tablet 3  ? losartan (COZAAR) 50 MG tablet Take 1 tablet (50 mg total) by mouth daily. for blood pressure. 90 tablet 1  ? Probiotic Product (PROBIOTIC PO) Take 1 tablet by mouth daily.    ? ?No current facility-administered medications for this visit.  ? ? ?OBJECTIVE: ?Vitals:  ? 07/30/21 1001  ?BP: 119/74  ?Pulse: (!) 110  ?Resp: 16  ?Temp: (!) 97.1 ?F (36.2 ?C)  ?SpO2: 99%  ?   Body mass index is 26.24 kg/m?Marland Kitchen    ECOG FS:0 -  Asymptomatic ? ?General: Well-developed, well-nourished, no acute distress. ?Eyes: Pink conjunctiva, anicteric sclera. ?HEENT: Normocephalic, moist mucous membranes. ?Breast: Exam deferred today. ?Lungs: No audible wheezing or coughing. ?Heart: Regular rate and rhythm. ?Abdomen: Soft, nontender, no obvious distention. ?Musculoskeletal: No edema, cyanosis, or clubbing. ?Neuro: Alert, answering all questions appropriately. Cranial nerves grossly intact. ?Skin: No rashes or petechiae noted. ?Psych: Normal affect. ? ? ?LAB RESULTS: ? ?Lab Results  ?Component Value Date  ? NA 137 06/06/2021  ? K 4.5 06/06/2021  ? CL 101 06/06/2021  ? CO2 31 06/06/2021  ? GLUCOSE 105 (H) 06/06/2021  ? BUN 17 06/06/2021  ? CREATININE 1.08 06/06/2021  ? CALCIUM 9.8 06/06/2021  ? PROT 7.2 06/06/2021  ? ALBUMIN 4.4  06/06/2021  ? AST 21 06/06/2021  ? ALT 19 06/06/2021  ? ALKPHOS 48 06/06/2021  ? BILITOT 0.6 06/06/2021  ? GFRNONAA 50 (L) 12/22/2016  ? GFRAA 58 (L) 12/22/2016  ? ? ?Lab Results  ?Component Value Date  ? WBC 6.5 06/06/2021  ? NEUTROABS 5.3 02/03/2018  ? HGB 12.3 06/06/2021  ? HCT 36.2 06/06/2021  ? MCV 91.7 06/06/2021  ? PLT 262.0 06/06/2021  ? ? ? ?STUDIES: ?No results found. ? 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 ? ?PLAN:   ? ?1. 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 She then underwent a lumpectomy on July 14, 2016 and completed adjuvant XRT.  Continue letrozole for a minimum of 5 years completing treatment in June 2023.  Patient has been instructed to complete her recently filled 90-day supply and then discontinue treatment.  Her most recent mammogram on November 21, 2020 was reported as BI-RADS 1.  Repeat in August 2023.  Patient will follow-up several days after her mammogram and  then can be transitioned to yearly visits.    ?2.  Decreased EF%: Patient had a pretreatment EF of 41%. Continue monitoring evaluation per cardiology. ?3.  Osteopenia: Patient's bone mineral density on November 21, 2020 was sign

## 2021-07-30 ENCOUNTER — Inpatient Hospital Stay: Payer: PPO | Attending: Oncology | Admitting: Oncology

## 2021-07-30 ENCOUNTER — Ambulatory Visit: Payer: PPO | Admitting: Oncology

## 2021-07-30 ENCOUNTER — Encounter: Payer: Self-pay | Admitting: Oncology

## 2021-07-30 VITALS — BP 119/74 | HR 110 | Temp 97.1°F | Resp 16 | Ht 65.0 in | Wt 157.7 lb

## 2021-07-30 DIAGNOSIS — Z79811 Long term (current) use of aromatase inhibitors: Secondary | ICD-10-CM | POA: Diagnosis not present

## 2021-07-30 DIAGNOSIS — Z170 Estrogen receptor positive status [ER+]: Secondary | ICD-10-CM | POA: Insufficient documentation

## 2021-07-30 DIAGNOSIS — Z853 Personal history of malignant neoplasm of breast: Secondary | ICD-10-CM

## 2021-07-30 DIAGNOSIS — C50412 Malignant neoplasm of upper-outer quadrant of left female breast: Secondary | ICD-10-CM | POA: Diagnosis not present

## 2021-07-30 DIAGNOSIS — M8580 Other specified disorders of bone density and structure, unspecified site: Secondary | ICD-10-CM | POA: Insufficient documentation

## 2021-07-30 DIAGNOSIS — Z87891 Personal history of nicotine dependence: Secondary | ICD-10-CM | POA: Diagnosis not present

## 2021-07-30 DIAGNOSIS — Z923 Personal history of irradiation: Secondary | ICD-10-CM | POA: Insufficient documentation

## 2021-08-14 ENCOUNTER — Ambulatory Visit (INDEPENDENT_AMBULATORY_CARE_PROVIDER_SITE_OTHER): Payer: PPO

## 2021-08-14 VITALS — Wt 157.0 lb

## 2021-08-14 DIAGNOSIS — Z0000 Encounter for general adult medical examination without abnormal findings: Secondary | ICD-10-CM | POA: Diagnosis not present

## 2021-08-14 NOTE — Progress Notes (Signed)
? ?Subjective:  ? Catherine Tate is a 81 y.o. female who presents for Medicare Annual (Subsequent) preventive examination. ? ?Virtual Visit via Telephone Note ? ?I connected with  Catherine Tate on 08/14/21 at  9:15 AM EDT by telephone and verified that I am speaking with the correct person using two identifiers. ? ?Location: ?Patient: Home ?Provider: [facility] [facility] ?Persons participating in the virtual visit: patient/Nurse Health Advisor ?  ?I discussed the limitations, risks, security and privacy concerns of performing an evaluation and management service by telephone and the availability of in person appointments. The patient expressed understanding and agreed to proceed. ? ?Interactive audio and video telecommunications were attempted between this nurse and patient, however failed, due to patient having technical difficulties OR patient did not have access to video capability.  We continued and completed visit with audio only. ? ?Some vital signs may be absent or patient reported.  ? ?[name], LPN  ? ?Review of Systems    ? ?Cardiac Risk Factors include: advanced age (>69mn, >>47women);diabetes mellitus;dyslipidemia;hypertension ? ?   ?Objective:  ?  ?Today's Vitals  ? 08/14/21 08469 ?Weight: 157 lb (71.2 kg)  ?PainSc: 0-No pain  ? ?Body mass index is 26.13 kg/m?. ? ? ?  08/14/2021  ?  9:23 AM 07/30/2021  ? 10:05 AM 01/28/2021  ? 10:08 AM 01/02/2021  ?  8:32 AM 07/26/2020  ? 10:55 AM 02/17/2020  ? 10:35 AM 01/24/2020  ?  3:33 PM  ?Advanced Directives  ?Does Patient Have a Medical Advance Directive? Yes Yes Yes Yes Yes Yes Yes  ?Type of AParamedicof [place]icks[place] City[place] will Living will;Healthcare Power of [place] will Living will  [place] will [place]cad[place] will  ?Does patient want to make changes to medical advance directive?       Yes (ED - Information included in AVS)  ?Copy of [place] Chart? Yes - validated most recent copy scanned in chart (See row information)     Yes - validated most recent copy scanned in chart (See row information)   ?Would patient like information on creating a medical advance directive?       Yes (ED - Information included in AVS)  ? ? ?Current Medications (verified) ?Outpatient Encounter Medications as of 08/14/2021  ?Medication Sig  ? acetaminophen (TYLENOL) 500 MG tablet Take 1,000 mg by mouth every 6 (six) hours as needed for moderate pain.   ? alendronate (FOSAMAX) 70 MG tablet Take 1 tablet (70 mg total) by mouth once a week. Take with a full glass of water on an empty stomach.  ? Ascorbic Acid (VITAMIN C) 1000 MG tablet Take 1,000 mg by mouth daily.  ? atorvastatin (LIPITOR) 10 MG tablet TAKE 1 TABLET BY MOUTH ONCE A DAY FOR CHOLSTEROL.  ? Calcium Carb-Cholecalciferol (CALCIUM/VITAMIN D PO) Take 1,200 mg by mouth.  ? CRANBERRY PO Take 1 capsule by mouth 2 (two) times daily.  ? hydrochlorothiazide (HYDRODIURIL) 25 MG tablet TAKE 1 TABLET BY MOUTH ONCE DAILY for blood pressure. Office visit required for further refills. '  ? letrozole (FAshley 2.5 MG tablet TAKE 1 TABLET BY MOUTH ONCE DAILY  ? losartan (COZAAR) 50 MG tablet Take 1 tablet (50 mg total) by mouth daily. for blood pressure.  ? Probiotic Product (PROBIOTIC PO) Take 1 tablet by mouth daily.  ? ?No facility-administered encounter medications on file as of 08/14/2021.  ? ? ?  Allergies (verified) ?Bee pollen, Mosquito (diagnostic), Other, Gabapentin, Mosquito (culex pipiens) allergy skin test, Norvasc [amlodipine besylate], and Lisinopril  ? ?History: ?Past Medical History:  ?Diagnosis Date  ? Arthritis   ? Cancer ([place]) 03/13/2016  ? INVASIVE MAMMARY CARCINOMA  ? Cataract 02/03/2018  ? Chickenpox   ? Complication of anesthesia   ? Frequent UTI   ? GERD (gastroesophageal reflux disease)   ? RARE  ? Hypertension   ? Personal history of chemotherapy 2017/2018  ? F/U left breast cancer  ? Personal history  of radiation therapy 2018  ? F/U left breast cancer  ? PONV (postoperative nausea and vomiting)   ? RSD (reflex sympathetic dystrophy)   ? Seasonal allergies   ? Skin cancer   ? Urine incontinence   ? ?Past Surgical History:  ?Procedure Laterality Date  ? ABDOMINAL HYSTERECTOMY  2005  ? total/ Dr Davis Gourd  ? BLADDER TUMOR EXCISION  2006  ? BREAST BIOPSY Left 03/13/2016  ? INVASIVE MAMMARY CARCINOMA  ? BREAST LUMPECTOMY Left 2018  ? BREAST LUMPECTOMY WITH SENTINEL LYMPH NODE BIOPSY Left 07/14/2016  ? Procedure: BREAST LUMPECTOMY WITH SENTINEL LYMPH NODE BX;  Surgeon: [name], MD;  Location: [facility];  Service: General;  Laterality: Left;  ? COLONOSCOPY WITH PROPOFOL N/A 01/02/2021  ? Procedure: COLONOSCOPY WITH PROPOFOL;  Surgeon: [name], MD;  Location: [facility];  Service: Endoscopy;  Laterality: N/A;  ? denture surgery    ? OOPHORECTOMY    ? PORTACATH PLACEMENT N/A 04/01/2016  ? Procedure: INSERTION PORT-A-CATH;  Surgeon: [name], MD;  Location: [facility] ORS;  Service: General;  Laterality: N/A;  ? SKIN CANCER EXCISION Right 2015  ? foot/ Dr [place] Crane  ? TUBAL LIGATION    ? TUMOR EXCISION Right   ? FOOT  ? ?Family History  ?Problem Relation Age of Onset  ? Emphysema Father   ? Heart disease Mother   ? Transient ischemic attack Mother   ? Breast cancer Cousin   ?     paternal  ? Kidney cancer Neg Hx   ? Bladder Cancer Neg Hx   ? ?Social History  ? ?Socioeconomic History  ? Marital status: Single  ?  Spouse name: Not on file  ? Number of children: Not on file  ? Years of education: Not on file  ? Highest education level: Not on file  ?Occupational History  ? Occupation: retired  ?Tobacco Use  ? Smoking status: Former  ?  Packs/day: 1.00  ?  Years: 20.00  ?  Pack years: 20.00  ?  Types: Cigarettes  ?  Quit date: 04/14/1994  ?  Years since quitting: 27.3  ? Smokeless tobacco: Never  ?Vaping Use  ? Vaping Use: Never used  ?Substance and Sexual Activity  ? Alcohol use: No  ?  Alcohol/week: 0.0  standard drinks  ? Drug use: No  ? Sexual activity: Not Currently  ?Other Topics Concern  ? Not on file  ?Social History Narrative  ? Single.  ? 2 children, 1 grandchild.  ? 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  ? Enjoys making jewelry, puzzle books, sewing, traveling.  ? Son lives with her  ? ?Social Determinants of Health  ? ?Financial Resource Strain: Low Risk   ? Difficulty of Paying Living Expenses: Not hard at all  ?Food Insecurity: No Food Insecurity  ? Worried About Charity fundraiser in the Last Year: Never true  ? Ran Out of Food in the Last  Year: Never true  ?Transportation Needs: No Transportation Needs  ? Lack of Transportation (Medical): No  ? Lack of Transportation (Non-Medical): No  ?Physical Activity: Insufficiently Active  ? Days of Exercise per Week: 4 days  ? Minutes of Exercise per Session: 30 min  ?Stress: No Stress Concern Present  ? Feeling of Stress : Not at all  ?Social Connections: Moderately Integrated  ? Frequency of Communication with Friends and Family: More than three times a week  ? Frequency of Social Gatherings with Friends and Family: More than three times a week  ? Attends Religious Services: More than 4 times per year  ? Active Member of Clubs or Organizations: Yes  ? Attends Archivist Meetings: More than 4 times per year  ? Marital Status: Never married  ? ? ?Tobacco Counseling ?Counseling given: Not Answered ? ? ?Clinical Intake: ? ?Pre-visit preparation completed: Yes ? ?Pain : No/denies pain ?Pain Score: 0-No pain ? ?  ? ?BMI - recorded: 26.13 ?Nutritional Status: BMI 25 -29 Overweight ?Nutritional Risks: None ?Diabetes: Yes ?CBG done?: No ?Did pt. bring in CBG monitor from home?: No ? ?How often do you need to have someone help you when you read instructions, pamphlets, or other written materials from your doctor or pharmacy?: 1 - Never ? ?Diabetic? Nutrition Risk Assessment: ? ?Has the patient had any N/V/D within the last 2 months?  No  ?Does the  patient have any non-healing wounds?  No  ?Has the patient had any unintentional weight loss or weight gain?  No  ? ?Diabetes: ? ?Is the patient diabetic?  Yes  ?If diabetic, was a CBG obtained today?  No  ?Sharma Covert

## 2021-08-14 NOTE — Patient Instructions (Signed)
Ms. Brodzinski , ?Thank you for taking time to come for your Medicare Wellness Visit. I appreciate your ongoing commitment to your health goals. Please review the following plan we discussed and let me know if I can assist you in the future.  ? ?Screening recommendations/referrals: ?Colonoscopy: Done 01/02/2021 - likely no repeat due to age ?Mammogram: Done 11/21/2020 - Repeat annually - appointment for 11/25/2021 ?Bone Density: Done 11/21/2020 - Repeat annually - appointment for 11/25/2021 ?Recommended yearly ophthalmology/optometry visit for glaucoma screening and checkup ?Recommended yearly dental visit for hygiene and checkup ? ?Vaccinations: ?Influenza vaccine: Done 12/27/2020 - Repeat annually ?Pneumococcal vaccine: Done 11/05/2015 & 02/09/2017 ?Tdap vaccine: Done 09/10/2010 - Repeat in 10 years *due ?Shingles vaccine: Zostavax done 2001 - Shingrix is 2 doses 2-6 months apart and over 90% effective  *due   ?Covid-19: Done 06/07/2019, 06/28/2019, 01/25/2020, & 10/01/2020 ? ?Advanced directives: in chart ? ?Conditions/risks identified: Aim for 30 minutes of exercise or brisk walking, 6-8 glasses of water, and 5 servings of fruits and vegetables each day.  ? ?Next appointment: Follow up in one year for your annual wellness visit  ? ? ?Preventive Care 30 Years and Older, Female ?Preventive care refers to lifestyle choices and visits with your health care provider that can promote health and wellness. ?What does preventive care include? ?A yearly physical exam. This is also called an annual well check. ?Dental exams once or twice a year. ?Routine eye exams. Ask your health care provider how often you should have your eyes checked. ?Personal lifestyle choices, including: ?Daily care of your teeth and gums. ?Regular physical activity. ?Eating a healthy diet. ?Avoiding tobacco and drug use. ?Limiting alcohol use. ?Practicing safe sex. ?Taking low-dose aspirin every day. ?Taking vitamin and mineral supplements as recommended by your  health care provider. ?What happens during an annual well check? ?The services and screenings done by your health care provider during your annual well check will depend on your age, overall health, lifestyle risk factors, and family history of disease. ?Counseling  ?Your health care provider may ask you questions about your: ?Alcohol use. ?Tobacco use. ?Drug use. ?Emotional well-being. ?Home and relationship well-being. ?Sexual activity. ?Eating habits. ?History of falls. ?Memory and ability to understand (cognition). ?Work and work Statistician. ?Reproductive health. ?Screening  ?You may have the following tests or measurements: ?Height, weight, and BMI. ?Blood pressure. ?Lipid and cholesterol levels. These may be checked every 5 years, or more frequently if you are over 42 years old. ?Skin check. ?Lung cancer screening. You may have this screening every year starting at age 46 if you have a 30-pack-year history of smoking and currently smoke or have quit within the past 15 years. ?Fecal occult blood test (FOBT) of the stool. You may have this test every year starting at age 59. ?Flexible sigmoidoscopy or colonoscopy. You may have a sigmoidoscopy every 5 years or a colonoscopy every 10 years starting at age 8. ?Hepatitis C blood test. ?Hepatitis B blood test. ?Sexually transmitted disease (STD) testing. ?Diabetes screening. This is done by checking your blood sugar (glucose) after you have not eaten for a while (fasting). You may have this done every 1-3 years. ?Bone density scan. This is done to screen for osteoporosis. You may have this done starting at age 34. ?Mammogram. This may be done every 1-2 years. Talk to your health care provider about how often you should have regular mammograms. ?Talk with your health care provider about your test results, treatment options, and if necessary, the need  for more tests. ?Vaccines  ?Your health care provider may recommend certain vaccines, such as: ?Influenza vaccine.  This is recommended every year. ?Tetanus, diphtheria, and acellular pertussis (Tdap, Td) vaccine. You may need a Td booster every 10 years. ?Zoster vaccine. You may need this after age 38. ?Pneumococcal 13-valent conjugate (PCV13) vaccine. One dose is recommended after age 81. ?Pneumococcal polysaccharide (PPSV23) vaccine. One dose is recommended after age 108. ?Talk to your health care provider about which screenings and vaccines you need and how often you need them. ?This information is not intended to replace advice given to you by your health care provider. Make sure you discuss any questions you have with your health care provider. ?Document Released: 04/27/2015 Document Revised: 12/19/2015 Document Reviewed: 01/30/2015 ?Elsevier Interactive Patient Education ? 2017 [place]. ? ?Fall Prevention in the Home ?Falls can cause injuries. They can happen to people of all ages. There are many things you can do to make your home safe and to help prevent falls. ?What can I do on the outside of my home? ?Regularly fix the edges of walkways and driveways and fix any cracks. ?Remove anything that might make you trip as you walk through a door, such as a raised step or threshold. ?Trim any bushes or trees on the path to your home. ?Use bright outdoor lighting. ?Clear any walking paths of anything that might make someone trip, such as rocks or tools. ?Regularly check to see if handrails are loose or broken. Make sure that both sides of any steps have handrails. ?Any raised decks and porches should have guardrails on the edges. ?Have any leaves, snow, or ice cleared regularly. ?Use sand or salt on walking paths during winter. ?Clean up any spills in your garage right away. This includes oil or grease spills. ?What can I do in the bathroom? ?Use night lights. ?Install grab bars by the toilet and in the tub and shower. Do not use towel bars as grab bars. ?Use non-skid mats or decals in the tub or shower. ?If you need to sit  down in the shower, use a plastic, non-slip stool. ?Keep the floor dry. Clean up any water that spills on the floor as soon as it happens. ?Remove soap buildup in the tub or shower regularly. ?Attach bath mats securely with double-sided non-slip rug tape. ?Do not have throw rugs and other things on the floor that can make you trip. ?What can I do in the bedroom? ?Use night lights. ?Make sure that you have a light by your bed that is easy to reach. ?Do not use any sheets or blankets that are too big for your bed. They should not hang down onto the floor. ?Have a firm chair that has side arms. You can use this for support while you get dressed. ?Do not have throw rugs and other things on the floor that can make you trip. ?What can I do in the kitchen? ?Clean up any spills right away. ?Avoid walking on wet floors. ?Keep items that you use a lot in easy-to-reach places. ?If you need to reach something above you, use a strong step stool that has a grab bar. ?Keep electrical cords out of the way. ?Do not use floor polish or wax that makes floors slippery. If you must use wax, use non-skid floor wax. ?Do not have throw rugs and other things on the floor that can make you trip. ?What can I do with my stairs? ?Do not leave any items on the stairs. ?  Make sure that there are handrails on both sides of the stairs and use them. Fix handrails that are broken or loose. Make sure that handrails are as long as the stairways. ?Check any carpeting to make sure that it is firmly attached to the stairs. Fix any carpet that is loose or worn. ?Avoid having throw rugs at the top or bottom of the stairs. If you do have throw rugs, attach them to the floor with carpet tape. ?Make sure that you have a light switch at the top of the stairs and the bottom of the stairs. If you do not have them, ask someone to add them for you. ?What else can I do to help prevent falls? ?Wear shoes that: ?Do not have high heels. ?Have rubber bottoms. ?Are  comfortable and fit you well. ?Are closed at the toe. Do not wear sandals. ?If you use a stepladder: ?Make sure that it is fully opened. Do not climb a closed stepladder. ?Make sure that both sides of the step

## 2021-09-03 ENCOUNTER — Other Ambulatory Visit: Payer: Self-pay | Admitting: Primary Care

## 2021-09-03 DIAGNOSIS — I10 Essential (primary) hypertension: Secondary | ICD-10-CM

## 2021-09-23 ENCOUNTER — Encounter: Payer: Self-pay | Admitting: Family

## 2021-09-23 ENCOUNTER — Ambulatory Visit (INDEPENDENT_AMBULATORY_CARE_PROVIDER_SITE_OTHER): Payer: PPO | Admitting: Family

## 2021-09-23 VITALS — BP 122/66 | HR 119 | Temp 98.7°F | Resp 16 | Ht 65.0 in | Wt 152.5 lb

## 2021-09-23 DIAGNOSIS — J301 Allergic rhinitis due to pollen: Secondary | ICD-10-CM | POA: Diagnosis not present

## 2021-09-23 DIAGNOSIS — L03116 Cellulitis of left lower limb: Secondary | ICD-10-CM | POA: Insufficient documentation

## 2021-09-23 HISTORY — DX: Cellulitis of left lower limb: L03.116

## 2021-09-23 MED ORDER — FLUTICASONE PROPIONATE 50 MCG/ACT NA SUSP: 2.0000 | Freq: Every day | NASAL | 6 refills | Status: DC

## 2021-09-23 MED ORDER — CEPHALEXIN 500 MG PO CAPS: 500.0000 mg | ORAL_CAPSULE | Freq: Three times a day (TID) | ORAL | 0 refills | Status: AC

## 2021-09-23 MED ORDER — CETIRIZINE HCL 10 MG PO TABS
10.0000 mg | ORAL_TABLET | Freq: Every day | ORAL | 11 refills | Status: DC
Start: 2021-09-23 — End: 2023-01-26

## 2021-09-23 NOTE — Progress Notes (Signed)
Established Patient Office Visit  Subjective:  Patient ID: Catherine Tate, female    DOB: Jun 08, 1940  Age: 81 y.o. MRN: [id number]  CC:  Chief Complaint  Patient presents with   Rash    Left leg X 1 week    HPI Catherine Tate is here today with concerns.   Rash on left lower leg that is not improving.  Taking allergy medication and applying hydrocortisone without much relief.  Started benadryl a few days ago without relief either.  Rash is not itchy. Didn't see anything bite her.   Last few weeks ago with runny nose, pnd, scratchy throat. Sight nasal congestion. Comes and goes. Some blood in nose at times. No fever no chills. Also with a cough that is dry, worse in the am. Occasionally takes over the counter cough medication and an 'allergy pill' at [employer].    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   Cataract 02/03/2018   Chickenpox    Complication of anesthesia    Frequent UTI    GERD (gastroesophageal reflux disease)    RARE   Hypertension    Personal history of chemotherapy 2017/2018   F/U left breast cancer   Personal history of radiation therapy 2018   F/U left breast cancer   PONV (postoperative nausea and vomiting)    RSD (reflex sympathetic dystrophy)    Seasonal allergies    Skin cancer    Urine incontinence     Past Surgical History:  Procedure Laterality Date   ABDOMINAL HYSTERECTOMY  2005   total/ Dr Davis Gourd   BLADDER TUMOR EXCISION  2006   BREAST BIOPSY Left 03/13/2016   INVASIVE MAMMARY CARCINOMA   BREAST LUMPECTOMY Left 2018   BREAST LUMPECTOMY WITH SENTINEL LYMPH NODE BIOPSY Left 07/14/2016   Procedure: BREAST LUMPECTOMY WITH SENTINEL LYMPH NODE BX;  Surgeon: [name], MD;  Location: [facility] ORS;  Service: General;  Laterality: Left;   COLONOSCOPY WITH PROPOFOL N/A 01/02/2021   Procedure: COLONOSCOPY WITH PROPOFOL;  Surgeon: [name], MD;  Location: [place]  ENDOSCOPY;  Service: Endoscopy;  Laterality: N/A;   denture surgery     OOPHORECTOMY     PORTACATH PLACEMENT N/A 04/01/2016   Procedure: INSERTION PORT-A-CATH;  Surgeon: [name], MD;  Location: [facility] ORS;  Service: General;  Laterality: N/A;   SKIN CANCER EXCISION Right 2015   foot/ Dr Nevada Crane   TUBAL LIGATION     TUMOR EXCISION Right    FOOT    Family History  Problem Relation Age of Onset   Emphysema Father    Heart disease Mother    Transient ischemic attack Mother    Breast cancer Cousin        paternal   Kidney cancer Neg Hx    Bladder Cancer Neg Hx     Social History   Socioeconomic History   Marital status: Single    Spouse name: Not on file   Number of children: Not on file   Years of education: Not on file   Highest education level: Not on file  Occupational History   Occupation: retired  Tobacco Use   Smoking status: Former    Packs/day: 1.00    Years: 20.00    Total pack years: 20.00    Types: Cigarettes    Quit date: 04/14/1994    Years since quitting: 27.4   Smokeless tobacco: Never  Vaping  Use   Vaping Use: Never used  Substance and Sexual Activity   Alcohol use: No    Alcohol/week: 0.0 standard drinks of alcohol   Drug use: No   Sexual activity: Not Currently  Other Topics Concern   Not on file  Social History Narrative   Single.   2 children, 1 grandchild.   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   Enjoys making jewelry, puzzle books, sewing, traveling.   Son lives with her   Social Determinants of Health   Financial Resource Strain: Low Risk  (08/14/2021)   Overall Financial Resource Strain (CARDIA)    Difficulty of Paying Living Expenses: Not hard at all  Food Insecurity: No Food Insecurity (08/14/2021)   Hunger Vital Sign    Worried About Running Out of Food in the Last Year: Never true    [place] in the Last Year: Never true  Transportation Needs: No Transportation Needs (08/14/2021)   PRAPARE - Civil engineer, contracting (Medical): No    Lack of Transportation (Non-Medical): No  Physical Activity: Insufficiently Active (08/14/2021)   Exercise Vital Sign    Days of Exercise per Week: 4 days    Minutes of Exercise per Session: 30 min  Stress: No Stress Concern Present (08/14/2021)   [place]    Feeling of Stress : Not at all  Social Connections: Moderately Integrated (08/14/2021)   Social Connection and Isolation Panel [NHANES]    Frequency of Communication with Friends and Family: More than three times a week    Frequency of Social Gatherings with Friends and Family: More than three times a week    Attends Religious Services: More than 4 times per year    Active Member of [employer] or Organizations: Yes    Attends Music therapist: More than 4 times per year    Marital Status: Never married  Intimate Partner Violence: Not At Risk (08/14/2021)   Humiliation, Afraid, Rape, and Kick questionnaire    Fear of Current or Ex-Partner: No    Emotionally Abused: No    Physically Abused: No    Sexually Abused: No    Outpatient Medications Prior to Visit  Medication Sig Dispense Refill   acetaminophen (TYLENOL) 500 MG tablet Take 1,000 mg by mouth every 6 (six) hours as needed for moderate pain.      alendronate (FOSAMAX) 70 MG tablet Take 1 tablet (70 mg total) by mouth once a week. Take with a full glass of water on an empty stomach. 90 tablet 2   Ascorbic Acid (VITAMIN C) 1000 MG tablet Take 1,000 mg by mouth daily.     atorvastatin (LIPITOR) 10 MG tablet TAKE 1 TABLET BY MOUTH ONCE A DAY FOR CHOLSTEROL. 90 tablet 1   Calcium Carb-Cholecalciferol (CALCIUM/VITAMIN D PO) Take 1,200 mg by mouth.     CRANBERRY PO Take 1 capsule by mouth 2 (two) times daily.     hydrochlorothiazide (HYDRODIURIL) 25 MG tablet Take 1 tablet (25 mg total) by mouth daily. for blood pressure. 90 tablet 2   letrozole (FEMARA) 2.5 MG tablet TAKE 1  TABLET BY MOUTH ONCE DAILY 90 tablet 3   losartan (COZAAR) 50 MG tablet Take 1 tablet (50 mg total) by mouth daily. for blood pressure. 90 tablet 1   Probiotic Product (PROBIOTIC PO) Take 1 tablet by mouth daily.     No facility-administered medications prior to visit.  Allergies  Allergen Reactions   Bee Pollen Hives   Mosquito (Diagnostic) Hives   Other Other (See Comments)    Uncoded Allergy. Allergen: VICRYL  (90% glycolide and 10% L-lactide [polyglactin-- synthetic absorbable sterile surgical suture]), Other Reaction: Infection   Gabapentin Swelling   Mosquito (Culex Pipiens) Allergy Skin Test Hives   Norvasc [Amlodipine Besylate] Swelling    TO FEET   Lisinopril Cough        Objective:    Physical Exam Constitutional:      General: She is not in acute distress.    Appearance: Normal appearance. She is normal weight. She is not ill-appearing, toxic-appearing or diaphoretic.  HENT:     Right Ear: A middle ear effusion is present.     Left Ear: A middle ear effusion is present.     Nose: Nose normal. No congestion or rhinorrhea.     Right Turbinates: Not enlarged or swollen.     Left Turbinates: Not enlarged or swollen.     Right Sinus: No maxillary sinus tenderness or frontal sinus tenderness.     Left Sinus: No maxillary sinus tenderness or frontal sinus tenderness.     Mouth/Throat:     Mouth: Mucous membranes are moist.     Pharynx: No pharyngeal swelling, oropharyngeal exudate or posterior oropharyngeal erythema.     Tonsils: No tonsillar exudate.  Eyes:     Extraocular Movements: Extraocular movements intact.     Conjunctiva/sclera: Conjunctivae normal.     Pupils: Pupils are equal, round, and reactive to light.  Neck:     Thyroid: No thyroid mass.  Cardiovascular:     Rate and Rhythm: Normal rate and regular rhythm.  Pulmonary:     Effort: Pulmonary effort is normal.     Breath sounds: Normal breath sounds.  Lymphadenopathy:     Cervical:     Right  cervical: No superficial cervical adenopathy.    Left cervical: No superficial cervical adenopathy.  Skin:    Findings: Rash (red flat rash with warm to site on left lower leg medial shin) present.  Neurological:     Mental Status: She is alert.        BP 122/66   Pulse (!) 119   Temp 98.7 F (37.1 C)   Resp 16   Ht '5\' 5"'$  (1.651 m)   Wt 152 lb 8 oz (69.2 kg)   SpO2 98%   BMI 25.38 kg/m  Wt Readings from Last 3 Encounters:  09/23/21 152 lb 8 oz (69.2 kg)  08/14/21 157 lb (71.2 kg)  07/30/21 157 lb 11.2 oz (71.5 kg)     Health Maintenance Due  Topic Date Due   Hepatitis C Screening  Never done   COVID-19 Vaccine (4 - Booster for Pfizer series) 03/21/2020   OPHTHALMOLOGY EXAM  08/01/2020   TETANUS/TDAP  09/09/2020   FOOT EXAM  08/21/2021    There are no preventive care reminders to display for this patient.  Lab Results  Component Value Date   TSH 3.31 02/03/2018   Lab Results  Component Value Date   WBC 6.5 06/06/2021   HGB 12.3 06/06/2021   HCT 36.2 06/06/2021   MCV 91.7 06/06/2021   PLT 262.0 06/06/2021   Lab Results  Component Value Date   NA 137 06/06/2021   K 4.5 06/06/2021   CO2 31 06/06/2021   GLUCOSE 105 (H) 06/06/2021   BUN 17 06/06/2021   CREATININE 1.08 06/06/2021   BILITOT 0.6 06/06/2021   ALKPHOS  48 06/06/2021   AST 21 06/06/2021   ALT 19 06/06/2021   PROT 7.2 06/06/2021   ALBUMIN 4.4 06/06/2021   CALCIUM 9.8 06/06/2021   ANIONGAP 8 12/22/2016   GFR 48.91 (L) 06/06/2021   Lab Results  Component Value Date   HGBA1C 6.6 (H) 06/06/2021      Assessment & Plan:   Problem List Items Addressed This Visit       Respiratory   Seasonal allergic rhinitis due to pollen    Start flonase once daily as well as zyrtec nightly       Relevant Medications   fluticasone (FLONASE) 50 MCG/ACT nasal spray   cetirizine (ZYRTEC) 10 MG tablet     Other   Cellulitis of left lower extremity - Primary    Cephalexin 500 mg tid x 7 days   Monitor daily for s/s infection        Relevant Medications   cephALEXin (KEFLEX) 500 MG capsule    Meds ordered this encounter  Medications   fluticasone (FLONASE) 50 MCG/ACT nasal spray    Sig: Place 2 sprays into both nostrils daily.    Dispense:  16 g    Refill:  6    Order Specific Question:   Supervising Provider    Answer:   BEDSOLE, AMY E [[id number]]   cetirizine (ZYRTEC) 10 MG tablet    Sig: Take 1 tablet (10 mg total) by mouth daily.    Dispense:  30 tablet    Refill:  11    Order Specific Question:   Supervising Provider    Answer:   Diona Browner, AMY E [[id number]]   cephALEXin (KEFLEX) 500 MG capsule    Sig: Take 1 capsule (500 mg total) by mouth 3 (three) times daily for 7 days.    Dispense:  21 capsule    Refill:  0    Order Specific Question:   Supervising Provider    Answer:   BEDSOLE, AMY E [[id number]]    Follow-up: Return if symptoms worsen or fail to improve with pcp.    Eugenia Pancoast, FNP

## 2021-09-23 NOTE — Patient Instructions (Signed)
Start flonase daily as well as nightly zytrec.   Due to recent changes in healthcare laws, you may see results of your imaging and/or laboratory studies on MyChart before I have had a chance to review them.  I understand that in some cases there may be results that are confusing or concerning to you. Please understand that not all results are received at the same time and often I may need to interpret multiple results in order to provide you with the best plan of care or course of treatment. Therefore, I ask that you please give me 2 business days to thoroughly review all your results before contacting my office for clarification. Should we see a critical lab result, you will be contacted sooner.   It was a pleasure seeing you today! Please do not hesitate to reach out with any questions and or concerns.  Regards,   Eugenia Pancoast FNP-C

## 2021-09-23 NOTE — Assessment & Plan Note (Signed)
Cephalexin 500 mg tid x 7 days  Monitor daily for s/s infection

## 2021-09-23 NOTE — Assessment & Plan Note (Signed)
Start flonase once daily as well as zyrtec nightly

## 2021-09-30 ENCOUNTER — Other Ambulatory Visit: Payer: Self-pay | Admitting: Oncology

## 2021-11-25 ENCOUNTER — Ambulatory Visit
Admission: RE | Admit: 2021-11-25 | Discharge: 2021-11-25 | Disposition: A | Discharge status: Home / Self Care | Payer: PPO | Source: Ambulatory Visit | Attending: Oncology | Admitting: Oncology

## 2021-11-25 DIAGNOSIS — M8589 Other specified disorders of bone density and structure, multiple sites: Secondary | ICD-10-CM | POA: Diagnosis not present

## 2021-11-25 DIAGNOSIS — Z13820 Encounter for screening for osteoporosis: Secondary | ICD-10-CM | POA: Diagnosis not present

## 2021-11-25 DIAGNOSIS — Z780 Asymptomatic menopausal state: Secondary | ICD-10-CM | POA: Diagnosis not present

## 2021-11-25 DIAGNOSIS — Z853 Personal history of malignant neoplasm of breast: Secondary | ICD-10-CM

## 2021-11-25 DIAGNOSIS — Z79811 Long term (current) use of aromatase inhibitors: Secondary | ICD-10-CM

## 2021-11-25 DIAGNOSIS — Z1231 Encounter for screening mammogram for malignant neoplasm of breast: Secondary | ICD-10-CM | POA: Diagnosis not present

## 2021-11-25 DIAGNOSIS — M810 Age-related osteoporosis without current pathological fracture: Secondary | ICD-10-CM | POA: Diagnosis not present

## 2021-11-27 ENCOUNTER — Encounter: Payer: Self-pay | Admitting: Medical Oncology

## 2021-11-27 ENCOUNTER — Inpatient Hospital Stay: Payer: PPO | Attending: Oncology | Admitting: Medical Oncology

## 2021-11-27 ENCOUNTER — Other Ambulatory Visit: Payer: Self-pay | Admitting: *Deleted

## 2021-11-27 VITALS — BP 145/72 | HR 103 | Temp 98.2°F | Resp 16 | Ht 65.0 in | Wt 155.5 lb

## 2021-11-27 DIAGNOSIS — M8580 Other specified disorders of bone density and structure, unspecified site: Secondary | ICD-10-CM

## 2021-11-27 DIAGNOSIS — C50412 Malignant neoplasm of upper-outer quadrant of left female breast: Secondary | ICD-10-CM | POA: Diagnosis not present

## 2021-11-27 DIAGNOSIS — Z79811 Long term (current) use of aromatase inhibitors: Secondary | ICD-10-CM | POA: Diagnosis not present

## 2021-11-27 DIAGNOSIS — Z923 Personal history of irradiation: Secondary | ICD-10-CM | POA: Insufficient documentation

## 2021-11-27 DIAGNOSIS — Z170 Estrogen receptor positive status [ER+]: Secondary | ICD-10-CM | POA: Diagnosis not present

## 2021-11-27 DIAGNOSIS — Z9221 Personal history of antineoplastic chemotherapy: Secondary | ICD-10-CM | POA: Diagnosis not present

## 2021-11-27 DIAGNOSIS — M810 Age-related osteoporosis without current pathological fracture: Secondary | ICD-10-CM | POA: Diagnosis not present

## 2021-11-27 DIAGNOSIS — Z87891 Personal history of nicotine dependence: Secondary | ICD-10-CM | POA: Insufficient documentation

## 2021-11-27 DIAGNOSIS — Z853 Personal history of malignant neoplasm of breast: Secondary | ICD-10-CM | POA: Diagnosis not present

## 2021-11-27 NOTE — Progress Notes (Signed)
Pt. Catherine Tate that she has a bite and could not figure out what it came from. It made her have cellulitis left leg and she saw PCP and she started on her Zyrtec every day and it got better. She wears incontinence pads for 15 years and at times she can have burning due to the pads.

## 2021-11-27 NOTE — Progress Notes (Signed)
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  INTERVAL HISTORY: Patient returns to discuss her recent mammogram and DEXA scan. She has since finished her Letrozole course and continues her Fosamax, calcium, vitamin D. No new bone pains or breast changes.   REVIEW OF SYSTEMS:   Review of Systems  Constitutional: Negative.  Negative for fever, malaise/fatigue and weight loss.  HENT:  Negative for sinus pain.   Respiratory: Negative.  Negative for cough and shortness of breath.   Cardiovascular: Negative.  Negative for chest pain and leg swelling.  Gastrointestinal: Negative.  Negative for abdominal pain, constipation and nausea.  Genitourinary:  Negative for hematuria and urgency.  Musculoskeletal: Negative.   Skin: Negative.  Negative for rash.  Neurological: Negative.  Negative for sensory change, weakness and headaches.  Psychiatric/Behavioral: Negative.  The patient is not nervous/anxious and does not have insomnia.      As per HPI. Otherwise, a complete review of systems is negative.  PAST MEDICAL HISTORY: Past Medical History:  Diagnosis Date   Arthritis    Cancer ([place]) 03/13/2016   INVASIVE MAMMARY CARCINOMA   Cataract 02/03/2018   Chickenpox    Complication of anesthesia    Frequent UTI    GERD (gastroesophageal reflux disease)    RARE   Hypertension    Personal history of chemotherapy 2017/2018   F/U left breast cancer   Personal history of radiation therapy 2018   F/U left breast  cancer   PONV (postoperative nausea and vomiting)    RSD (reflex sympathetic dystrophy)    Seasonal allergies    Skin cancer    Urine incontinence     PAST SURGICAL HISTORY: Past Surgical History:  Procedure Laterality Date   ABDOMINAL HYSTERECTOMY  2005   total/ Dr Davis Gourd   BLADDER TUMOR EXCISION  2006   BREAST BIOPSY Left 03/13/2016   INVASIVE MAMMARY CARCINOMA   BREAST LUMPECTOMY Left 2018   BREAST LUMPECTOMY WITH SENTINEL LYMPH NODE BIOPSY Left 07/14/2016   Procedure: BREAST LUMPECTOMY WITH SENTINEL LYMPH NODE BX;  Surgeon: [name], MD;  Location: [facility];  Service: General;  Laterality: Left;   COLONOSCOPY WITH PROPOFOL N/A 01/02/2021   Procedure: COLONOSCOPY WITH PROPOFOL;  Surgeon: [name], MD;  Location: [facility];  Service: Endoscopy;  Laterality: N/A;   denture surgery     OOPHORECTOMY     PORTACATH PLACEMENT N/A 04/01/2016   Procedure: INSERTION PORT-A-CATH;  Surgeon: [name], MD;  Location: [facility];  Service: General;  Laterality: N/A;   SKIN CANCER EXCISION Right 2015   foot/ Dr Nevada Crane   TUBAL LIGATION     TUMOR EXCISION Right    FOOT    FAMILY HISTORY: Family History  Problem Relation Age of Onset   Heart disease Mother    Transient ischemic attack Mother    Emphysema Father    Breast cancer Sister    Breast cancer Cousin        paternal   Kidney cancer Neg Hx    Bladder Cancer Neg Hx     ADVANCED DIRECTIVES (  Y/N):  N  HEALTH MAINTENANCE: Social History   Tobacco Use   Smoking status: Former    Packs/day: 1.00    Years: 20.00    Total pack years: 20.00    Types: Cigarettes    Quit date: 04/14/1994    Years since quitting: 27.6   Smokeless tobacco: Never  Vaping Use   Vaping Use: Never used  Substance Use Topics   Alcohol use: No    Alcohol/week: 0.0 standard drinks of alcohol   Drug use: No     Colonoscopy:  PAP:  Bone density:  Lipid panel:  Allergies  Allergen Reactions   Bee Pollen Hives    Mosquito (Diagnostic) Hives   Other Other (See Comments)    Uncoded Allergy. Allergen: VICRYL  (90% glycolide and 10% L-lactide [polyglactin-- synthetic absorbable sterile surgical suture]), Other Reaction: Infection   Gabapentin Swelling   Mosquito (Culex Pipiens) Allergy Skin Test Hives   Norvasc [Amlodipine Besylate] Swelling    TO FEET   Lisinopril Cough    Current Outpatient Medications  Medication Sig Dispense Refill   acetaminophen (TYLENOL) 500 MG tablet Take 1,000 mg by mouth every 6 (six) hours as needed for moderate pain.      alendronate (FOSAMAX) 70 MG tablet TAKE 1 TABLET (70 MG TOTAL) BY MOUTH ONCE A WEEK. TAKE WITH A FULL GLASS OF WATER ON AN EMPTY STOMACH 12 tablet 3   Ascorbic Acid (VITAMIN C) 1000 MG tablet Take 1,000 mg by mouth daily.     atorvastatin (LIPITOR) 10 MG tablet TAKE 1 TABLET BY MOUTH ONCE A DAY FOR CHOLSTEROL. 90 tablet 1   Calcium Carb-Cholecalciferol (CALCIUM/VITAMIN D PO) Take 1,200 mg by mouth.     cetirizine (ZYRTEC) 10 MG tablet Take 1 tablet (10 mg total) by mouth daily. 30 tablet 11   CRANBERRY PO Take 1 capsule by mouth 2 (two) times daily.     fluticasone (FLONASE) 50 MCG/ACT nasal spray Place 2 sprays into both nostrils daily. 16 g 6   hydrochlorothiazide (HYDRODIURIL) 25 MG tablet Take 1 tablet (25 mg total) by mouth daily. for blood pressure. 90 tablet 2   losartan (COZAAR) 50 MG tablet Take 1 tablet (50 mg total) by mouth daily. for blood pressure. 90 tablet 1   Probiotic Product (PROBIOTIC PO) Take 1 tablet by mouth daily.     No current facility-administered medications for this visit.    OBJECTIVE: There were no vitals filed for this visit.    There is no height or weight on file to calculate BMI.    ECOG FS:0 - Asymptomatic  General: Well-developed, well-nourished, no acute distress. Eyes: Pink conjunctiva, anicteric sclera. HEENT: Normocephalic, moist mucous membranes. Breast: Exam deferred today. Lungs: No audible wheezing or  coughing. Heart: Regular rate and rhythm. Abdomen: Soft, nontender, no obvious distention. Musculoskeletal: No edema, cyanosis, or clubbing. Neuro: Alert, answering all questions appropriately. Cranial nerves grossly intact. Skin: No rashes or petechiae noted. Psych: Normal affect.   LAB RESULTS:  Lab Results  Component Value Date   NA 137 06/06/2021   K 4.5 06/06/2021   CL 101 06/06/2021   CO2 31 06/06/2021   GLUCOSE 105 (H) 06/06/2021   BUN 17 06/06/2021   CREATININE 1.08 06/06/2021   CALCIUM 9.8 06/06/2021   PROT 7.2 06/06/2021   ALBUMIN 4.4 06/06/2021   AST 21 06/06/2021   ALT 19 06/06/2021   ALKPHOS 48 06/06/2021   BILITOT 0.6 06/06/2021   GFRNONAA 50 (L) 12/22/2016   GFRAA 58 (  L) 12/22/2016    Lab Results  Component Value Date   WBC 6.5 06/06/2021   NEUTROABS 5.3 02/03/2018   HGB 12.3 06/06/2021   HCT 36.2 06/06/2021   MCV 91.7 06/06/2021   PLT 262.0 06/06/2021     STUDIES: MM 3D SCREEN BREAST BILATERAL  Result Date: 11/26/2021 CLINICAL DATA:  Screening. EXAM: DIGITAL SCREENING BILATERAL MAMMOGRAM WITH TOMOSYNTHESIS AND CAD TECHNIQUE: Bilateral screening digital craniocaudal and mediolateral oblique mammograms were obtained. Bilateral screening digital breast tomosynthesis was performed. The images were evaluated with computer-aided detection. COMPARISON:  Previous exam(s). ACR Breast Density Category b: There are scattered areas of fibroglandular density. FINDINGS: There are no findings suspicious for malignancy. IMPRESSION: No mammographic evidence of malignancy. A result letter of this screening mammogram will be mailed directly to the patient. RECOMMENDATION: Screening mammogram in one year. (Code:SM-B-01Y) BI-RADS CATEGORY  1: Negative. Electronically Signed   By: Everlean Alstrom M.D.   On: 11/26/2021 13:26   DG Bone Density  Result Date: 11/25/2021 EXAM: DUAL X-RAY ABSORPTIOMETRY (DXA) FOR BONE MINERAL DENSITY IMPRESSION: Your patient Catherine Tate  completed a BMD test on 11/25/2021 using the [employer] (software version: 14.10) manufactured by [employer]. The following summarizes the results of our evaluation. Technologist: SCE PATIENT BIOGRAPHICAL: Name: Catherine Tate, Catherine Tate Patient ID: [id number] Birth Date: [birth year] Height: 64.5 in. Gender: Female Exam Date: 11/25/2021 Weight: 154.2 lbs. Indications: Advanced Age, Caucasian, Diabetic, Family Hist. (Parent hip fracture), Family History of Fracture, Family Hx of Osteoporosis, Height Loss, History of Breast Cancer, History of Fracture (Adult), Hysterectomy, Oophorectomy Bilateral, Parent Hip Fracture, Postmenopausal, Previous Chemo and Radiation Fractures: Left forearm Treatments: calcium w/ vit D, Fosamax, ZYRTEC DENSITOMETRY RESULTS: Site          Region     Measured Date Measured Age WHO Classification Young Adult T-score BMD         %Change vs. Previous Significant Change (*) DualFemur Neck Left 11/25/2021 79.8 Osteopenia -2.2 0.731 g/cm2 1.5% - DualFemur Neck Left 11/21/2020 78.8 Osteopenia -2.3 0.720 g/cm2 -9.1% Yes DualFemur Neck Left 11/21/2019 77.8 Osteopenia -1.8 0.792 g/cm2 7.5% Yes DualFemur Neck Left 11/18/2018 76.7 Osteopenia -2.2 0.737 g/cm2 6.5% - DualFemur Neck Left 11/16/2017 75.7 Osteoporosis -2.5 0.692 g/cm2 -4.4% - DualFemur Neck Left 11/13/2016 74.7 Osteopenia -2.3 0.724 g/cm2 - - DualFemur Total Mean 11/25/2021 79.8 Osteopenia -1.5 0.821 g/cm2 -0.6% - DualFemur Total Mean 11/21/2020 78.8 Osteopenia -1.4 0.826 g/cm2 -0.2% - DualFemur Total Mean 11/21/2019 77.8 Osteopenia -1.4 0.828 g/cm2 -0.5% - DualFemur Total Mean 11/18/2018 76.7 Osteopenia -1.4 0.832 g/cm2 1.3% - DualFemur Total Mean 11/16/2017 75.7 Osteopenia -1.5 0.821 g/cm2 -0.6% - DualFemur Total Mean 11/13/2016 74.7 Osteopenia -1.4 0.826 g/cm2 - - Right Forearm Radius 33% 11/25/2021 79.8 Osteopenia -2.1 0.689 g/cm2 - - ASSESSMENT: The BMD measured at Femur Neck Left is 0.731 g/cm2 with a T-score of -2.2. This  patient is considered osteopenic according to [facility]) criteria. Lumbar spine was not utilized due to advanced degenerative changes. Patient is not a candidate for FRAX due to Fosamax. Compared with prior study, there has been no significant change in the total hip. The scan quality is good. World Health Organization [facility]) criteria for post-menopausal, Caucasian Women: Normal:                   T-score at or above -1 SD Osteopenia/low bone mass: T-score between -1 and -2.5 SD Osteoporosis:             T-score  at or below -2.5 SD RECOMMENDATIONS: 1. All patients should optimize calcium and vitamin D intake. 2. Consider FDA-approved medical therapies in postmenopausal women and men aged 64 years and older, based on the following: a. A hip or vertebral(clinical or morphometric) fracture b. T-score < -2.5 at the femoral neck or spine after appropriate evaluation to exclude secondary causes c. Low bone mass (T-score between -1.0 and -2.5 at the femoral neck or spine) and a 10-year probability of a hip fracture > 3% or a 10-year probability of a major osteoporosis-related fracture > 20% based on the [place]-adapted WHO algorithm 3. Clinician judgment and/or patient preferences may indicate treatment for people with 10-year fracture probabilities above or below these levels FOLLOW-UP: People with diagnosed cases of osteoporosis or at high risk for fracture should have regular bone mineral density tests. For patients eligible for Medicare, routine testing is allowed once every 2 years. The testing frequency can be increased to one year for patients who have rapidly progressing disease, those who are receiving or discontinuing medical therapy to restore bone mass, or have additional risk factors. I have reviewed this report, and agree with the above findings. [facility] Electronically Signed   By: Ammie Ferrier M.D.   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  PLAN:    1. 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 She then underwent a lumpectomy on July 14, 2016 and completed adjuvant XRT."  Finished Letrozole June 2023. Recent Mammogram on 11/25/2021 was benign appearing. She is now on yearly monitoring.  2.  Osteopenia: Patient's bone mineral density on November 21, 2020 was significantly worse than 1 year prior with a reported T score of -2.3.  Recent DEXA was slightly improved with a T score of -2.2. No recent fracture. Continue Fosamax, calcium and vitamin D.    Patient expressed understanding and was in agreement with this plan. She also understands that She can call clinic at any time with any questions, concerns, or complaints.     Cancer Staging  History of breast cancer Staging form: Breast, AJCC 7th Edition - Clinical stage from 03/19/2016: Stage IIA (T2, N0, M0) - Signed by Lloyd Huger, MD on 04/06/2016 Laterality: Right Estrogen receptor status: Positive Progesterone receptor status: Positive HER2 status: Negative - Pathologic stage from 07/27/2016: Stage IIA (T2, N0, cM0) - Signed by Lloyd Huger, MD on 07/27/2016 Laterality: Left Estrogen receptor status: Positive Progesterone receptor status: Positive HER2 status: Negative    Hughie Closs, PA-C 11/27/21 11:42 AM

## 2021-11-28 ENCOUNTER — Other Ambulatory Visit: Payer: Self-pay | Admitting: *Deleted

## 2021-11-28 DIAGNOSIS — C50412 Malignant neoplasm of upper-outer quadrant of left female breast: Secondary | ICD-10-CM

## 2021-12-03 ENCOUNTER — Ambulatory Visit: Payer: PPO | Admitting: Primary Care

## 2021-12-03 ENCOUNTER — Ambulatory Visit (INDEPENDENT_AMBULATORY_CARE_PROVIDER_SITE_OTHER): Payer: PPO | Admitting: Primary Care

## 2021-12-03 ENCOUNTER — Encounter: Payer: Self-pay | Admitting: Primary Care

## 2021-12-03 VITALS — BP 134/62 | HR 110 | Temp 98.6°F | Ht 65.0 in | Wt 151.0 lb

## 2021-12-03 DIAGNOSIS — E1165 Type 2 diabetes mellitus with hyperglycemia: Secondary | ICD-10-CM

## 2021-12-03 DIAGNOSIS — R300 Dysuria: Secondary | ICD-10-CM

## 2021-12-03 LAB — POC URINALSYSI DIPSTICK (AUTOMATED)
Bilirubin, UA: NEGATIVE
Blood, UA: NEGATIVE
Glucose, UA: NEGATIVE
Ketones, UA: NEGATIVE
Nitrite, UA: NEGATIVE
Protein, UA: NEGATIVE
Spec Grav, UA: <=1.005 — AB (ref 1.010–1.025)
Urobilinogen, UA: 0.2 E.U./dL
pH, UA: 5.5 (ref 5.0–8.0)

## 2021-12-03 LAB — POCT GLYCOSYLATED HEMOGLOBIN (HGB A1C): Hemoglobin A1C: 6.6 % — AB (ref 4.0–5.6)

## 2021-12-03 NOTE — Assessment & Plan Note (Signed)
UA today with 3+ leuks, otherwise negative. Culture pending.  Await results.

## 2021-12-03 NOTE — Progress Notes (Signed)
Subjective:    Patient ID: Catherine Tate, female    DOB: 31-May-1940, 81 y.o.   MRN: [id number]  Diabetes Pertinent negatives for diabetes include no chest pain.    Catherine Tate is a very pleasant 81 y.o. female with a history of type 2 diabetes, hyperlipidemia, breast cancer, recurrent UTI, hypertension who presents today for follow up of diabetes and to discuss dysuria.  Current medications include: None.  She is checking her blood glucose 0 times daily.  Last A1C: 6.6 in February 2023, 6.6 today Last Eye Exam: Due  Last Foot Exam: Due Pneumonia Vaccination: 2018 Urine Microalbumin: None. Managed on ARB Statin: atorvastatin   Dietary changes since last visit: Limiting sweets.    Exercise: No regular exercise.   BP Readings from Last 3 Encounters:  12/03/21 134/62  11/27/21 (!) 145/72  09/23/21 122/66   2) Dysuria: Chronic and intermittent, also history of cystitis and urinary incontinence. Recently she's noticed bothersome dysuria and would like her urine tested.   Last confirmed cystitis was in February 2023, culture positive with E coli. Treated with cephalexin 500 mg TID x 7 days in June 2023 for lower extremity cellulitis.     Review of Systems  Respiratory:  Negative for shortness of breath.   Cardiovascular:  Negative for chest pain.  Neurological:  Positive for numbness.         Past Medical History:  Diagnosis Date   Arthritis    Cancer ([place]) 03/13/2016   INVASIVE MAMMARY CARCINOMA   Cataract 02/03/2018   Chickenpox    Complication of anesthesia    Frequent UTI    GERD (gastroesophageal reflux disease)    RARE   Hypertension    Personal history of chemotherapy 2017/2018   F/U left breast cancer   Personal history of radiation therapy 2018   F/U left breast cancer   PONV (postoperative nausea and vomiting)    RSD (reflex sympathetic dystrophy)    Seasonal allergies    Skin cancer    Urine incontinence     Social History    Socioeconomic History   Marital status: Single    Spouse name: Not on file   Number of children: Not on file   Years of education: Not on file   Highest education level: Not on file  Occupational History   Occupation: retired  Tobacco Use   Smoking status: Former    Packs/day: 1.00    Years: 20.00    Total pack years: 20.00    Types: Cigarettes    Quit date: 04/14/1994    Years since quitting: 27.6   Smokeless tobacco: Never  Vaping Use   Vaping Use: Never used  Substance and Sexual Activity   Alcohol use: No    Alcohol/week: 0.0 standard drinks of alcohol   Drug use: No   Sexual activity: Not Currently  Other Topics Concern   Not on file  Social History Narrative   Single.   2 children, 1 grandchild.   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   Enjoys making jewelry, puzzle books, sewing, traveling.   Son lives with her   Social Determinants of Health   Financial Resource Strain: Low Risk  (08/14/2021)   Overall Financial Resource Strain (CARDIA)    Difficulty of Paying Living Expenses: Not hard at all  Food Insecurity: No Food Insecurity (08/14/2021)   Hunger Vital Sign    Worried About Running Out of Food in the Last Year: Never true  Ran Out of Food in the Last Year: Never true  Transportation Needs: No Transportation Needs (08/14/2021)   PRAPARE - Hydrologist (Medical): No    Lack of Transportation (Non-Medical): No  Physical Activity: Insufficiently Active (08/14/2021)   Exercise Vital Sign    Days of Exercise per Week: 4 days    Minutes of Exercise per Session: 30 min  Stress: No Stress Concern Present (08/14/2021)   [place]    Feeling of Stress : Not at all  Social Connections: Moderately Integrated (08/14/2021)   Social Connection and Isolation Panel [NHANES]    Frequency of Communication with Friends and Family: More than three times a week    Frequency of  Social Gatherings with Friends and Family: More than three times a week    Attends Religious Services: More than 4 times per year    Active Member of Clubs or Organizations: Yes    Attends Archivist Meetings: More than 4 times per year    Marital Status: Never married  Intimate Partner Violence: Not At Risk (08/14/2021)   Humiliation, Afraid, Rape, and Kick questionnaire    Fear of Current or Ex-Partner: No    Emotionally Abused: No    Physically Abused: No    Sexually Abused: No    Past Surgical History:  Procedure Laterality Date   ABDOMINAL HYSTERECTOMY  2005   total/ Dr Davis Gourd   BLADDER TUMOR EXCISION  2006   BREAST BIOPSY Left 03/13/2016   INVASIVE MAMMARY CARCINOMA   BREAST LUMPECTOMY Left 2018   BREAST LUMPECTOMY WITH SENTINEL LYMPH NODE BIOPSY Left 07/14/2016   Procedure: BREAST LUMPECTOMY WITH SENTINEL LYMPH NODE BX;  Surgeon: [name], MD;  Location: [facility];  Service: General;  Laterality: Left;   COLONOSCOPY WITH PROPOFOL N/A 01/02/2021   Procedure: COLONOSCOPY WITH PROPOFOL;  Surgeon: [name], MD;  Location: [facility];  Service: Endoscopy;  Laterality: N/A;   denture surgery     OOPHORECTOMY     PORTACATH PLACEMENT N/A 04/01/2016   Procedure: INSERTION PORT-A-CATH;  Surgeon: [name], MD;  Location: [facility];  Service: General;  Laterality: N/A;   SKIN CANCER EXCISION Right 2015   foot/ Dr Nevada Crane   TUBAL LIGATION     TUMOR EXCISION Right    FOOT    Family History  Problem Relation Age of Onset   Heart disease Mother    Transient ischemic attack Mother    Emphysema Father    Breast cancer Sister    Breast cancer Cousin        paternal   Kidney cancer Neg Hx    Bladder Cancer Neg Hx     Allergies  Allergen Reactions   Bee Pollen Hives   Mosquito (Diagnostic) Hives   Other Other (See Comments)    Uncoded Allergy. Allergen: VICRYL  (90% glycolide and 10% L-lactide [polyglactin-- synthetic absorbable sterile  surgical suture]), Other Reaction: Infection   Gabapentin Swelling   Mosquito (Culex Pipiens) Allergy Skin Test Hives   Norvasc [Amlodipine Besylate] Swelling    TO FEET   Lisinopril Cough    Current Outpatient Medications on File Prior to Visit  Medication Sig Dispense Refill   acetaminophen (TYLENOL) 500 MG tablet Take 1,000 mg by mouth every 6 (six) hours as needed for moderate pain.      alendronate (FOSAMAX) 70 MG tablet TAKE 1 TABLET (70 MG TOTAL) BY MOUTH ONCE A  WEEK. TAKE WITH A FULL GLASS OF WATER ON AN EMPTY STOMACH 12 tablet 3   Ascorbic Acid (VITAMIN C) 1000 MG tablet Take 1,000 mg by mouth daily.     atorvastatin (LIPITOR) 10 MG tablet TAKE 1 TABLET BY MOUTH ONCE A DAY FOR CHOLSTEROL. 90 tablet 1   Calcium Carb-Cholecalciferol (CALCIUM/VITAMIN D PO) Take 1,200 mg by mouth.     CRANBERRY PO Take 1 capsule by mouth 2 (two) times daily.     fluticasone (FLONASE) 50 MCG/ACT nasal spray Place 2 sprays into both nostrils daily. 16 g 6   hydrochlorothiazide (HYDRODIURIL) 25 MG tablet Take 1 tablet (25 mg total) by mouth daily. for blood pressure. 90 tablet 2   losartan (COZAAR) 50 MG tablet Take 1 tablet (50 mg total) by mouth daily. for blood pressure. 90 tablet 1   Probiotic Product (PROBIOTIC PO) Take 1 tablet by mouth daily.     cetirizine (ZYRTEC) 10 MG tablet Take 1 tablet (10 mg total) by mouth daily. (Patient not taking: Reported on 12/03/2021) 30 tablet 11   No current facility-administered medications on file prior to visit.    BP 134/62   Pulse (!) 110   Temp 98.6 F (37 C) (Oral)   Ht '5\' 5"'$  (1.651 m)   Wt 151 lb (68.5 kg)   SpO2 96%   BMI 25.13 kg/m  Objective:   Physical Exam Constitutional:      General: She is not in acute distress.    Appearance: She is not ill-appearing.  Cardiovascular:     Rate and Rhythm: Normal rate and regular rhythm.  Pulmonary:     Effort: Pulmonary effort is normal.     Breath sounds: Normal breath sounds.  Musculoskeletal:      Cervical back: Neck supple.  Skin:    General: Skin is warm and dry.           Assessment & Plan:   Problem List Items Addressed This Visit       Endocrine   Type 2 diabetes mellitus with hyperglycemia ([place]) - Primary    Controlled with A1C today 6.6 today.  Continue off medications.  Continue to work on diet and walking.  Managed on statin and ARB. Foot exam today. Pneumonia vaccine UTD.   Follow up in 6 months.       Relevant Orders   POCT glycosylated hemoglobin (Hb A1C) (Completed)     Other   Dysuria    UA today with 3+ leuks, otherwise negative. Culture pending.  Await results.       Relevant Orders   POCT Urinalysis Dipstick (Automated)   Urine Culture       [name], NP

## 2021-12-03 NOTE — Assessment & Plan Note (Signed)
Controlled with A1C today 6.6 today.  Continue off medications.  Continue to work on diet and walking.  Managed on statin and ARB. Foot exam today. Pneumonia vaccine UTD.   Follow up in 6 months.

## 2021-12-03 NOTE — Patient Instructions (Signed)
We will be in touch on Thursday once we receive your urine test results.  Please schedule a physical to meet with me in 6 months.   It was a pleasure to see you today!

## 2021-12-05 ENCOUNTER — Other Ambulatory Visit: Payer: Self-pay | Admitting: Primary Care

## 2021-12-05 DIAGNOSIS — N3000 Acute cystitis without hematuria: Secondary | ICD-10-CM

## 2021-12-05 LAB — URINE CULTURE
MICRO NUMBER:: 13814220
SPECIMEN QUALITY:: ADEQUATE

## 2021-12-05 MED ORDER — SULFAMETHOXAZOLE-TRIMETHOPRIM 800-160 MG PO TABS: 1.0000 | ORAL_TABLET | Freq: Two times a day (BID) | ORAL | 0 refills | Status: DC

## 2021-12-11 ENCOUNTER — Encounter: Payer: Self-pay | Admitting: Primary Care

## 2022-01-20 ENCOUNTER — Other Ambulatory Visit: Payer: Self-pay | Admitting: Primary Care

## 2022-01-20 DIAGNOSIS — E785 Hyperlipidemia, unspecified: Secondary | ICD-10-CM

## 2022-01-20 DIAGNOSIS — I10 Essential (primary) hypertension: Secondary | ICD-10-CM

## 2022-01-29 ENCOUNTER — Ambulatory Visit: Payer: PPO | Admitting: Medical Oncology

## 2022-04-18 ENCOUNTER — Other Ambulatory Visit: Payer: Self-pay | Admitting: Primary Care

## 2022-04-18 DIAGNOSIS — I10 Essential (primary) hypertension: Secondary | ICD-10-CM

## 2022-04-18 DIAGNOSIS — E785 Hyperlipidemia, unspecified: Secondary | ICD-10-CM

## 2022-04-21 ENCOUNTER — Telehealth: Payer: Self-pay | Admitting: Primary Care

## 2022-04-21 DIAGNOSIS — I10 Essential (primary) hypertension: Secondary | ICD-10-CM

## 2022-04-22 MED ORDER — HYDROCHLOROTHIAZIDE 25 MG PO TABS: 25.0000 mg | ORAL_TABLET | Freq: Every day | ORAL | 0 refills | Status: DC

## 2022-04-22 NOTE — Telephone Encounter (Signed)
Patient states she has about 2 weeks of HCTZ pills left. Verified she is taking 1 tab daily.  She has CPE scheduled 06/10/22

## 2022-04-22 NOTE — Telephone Encounter (Signed)
Prescription Request  04/22/2022  Is this a "Controlled Substance" medicine? No  LOV: 12/03/2021  What is the name of the medication or equipment? hydrochlorothiazide (HYDRODIURIL) 25 MG tablet   Have you contacted your pharmacy to request a refill? Yes   Which pharmacy would you like this sent to?  [facility], [place] [address] Phone: [phone] Fax: [phone]    Patient notified that their request is being sent to the clinical staff for review and that they should receive a response within 2 business days.   Please advise at Mobile [phone] (mobile)

## 2022-04-22 NOTE — Telephone Encounter (Signed)
Refills sent to pharmacy. 

## 2022-04-22 NOTE — Telephone Encounter (Signed)
She should have enough HCTZ blood pressure medication to last for another 6 weeks. Is she running low?  If not, then have her notify us when she's about 2 weeks from running out.   Also make sure she is scheduled for CPE for late February 2024

## 2022-06-09 ENCOUNTER — Telehealth: Payer: Self-pay | Admitting: Primary Care

## 2022-06-09 ENCOUNTER — Other Ambulatory Visit: Payer: Self-pay | Admitting: *Deleted

## 2022-06-09 MED ORDER — ALENDRONATE SODIUM 70 MG PO TABS: 70.0000 mg | ORAL_TABLET | ORAL | 3 refills | Status: DC

## 2022-06-09 NOTE — Telephone Encounter (Signed)
Prescription Request  06/09/2022  Is this a "Controlled Substance" medicine? No  LOV: 12/03/2021  What is the name of the medication or equipment? hydrochlorothiazide (HYDRODIURIL) 25 MG tablet   Have you contacted your pharmacy to request a refill? No   Which pharmacy would you like this sent to?  [place], [place] [address] Phone: [phone] Fax: [phone]    Patient notified that their request is being sent to the clinical staff for review and that they should receive a response within 2 business days.   Please advise at [place]

## 2022-06-10 ENCOUNTER — Encounter: Payer: Self-pay | Admitting: Primary Care

## 2022-06-10 ENCOUNTER — Ambulatory Visit (INDEPENDENT_AMBULATORY_CARE_PROVIDER_SITE_OTHER): Payer: PPO | Admitting: Primary Care

## 2022-06-10 VITALS — BP 136/78 | HR 103 | Temp 98.6°F | Ht 65.0 in | Wt 157.0 lb

## 2022-06-10 DIAGNOSIS — Z0001 Encounter for general adult medical examination with abnormal findings: Secondary | ICD-10-CM | POA: Diagnosis not present

## 2022-06-10 DIAGNOSIS — E1165 Type 2 diabetes mellitus with hyperglycemia: Secondary | ICD-10-CM

## 2022-06-10 DIAGNOSIS — M810 Age-related osteoporosis without current pathological fracture: Secondary | ICD-10-CM

## 2022-06-10 DIAGNOSIS — Z853 Personal history of malignant neoplasm of breast: Secondary | ICD-10-CM | POA: Diagnosis not present

## 2022-06-10 DIAGNOSIS — I10 Essential (primary) hypertension: Secondary | ICD-10-CM

## 2022-06-10 DIAGNOSIS — E785 Hyperlipidemia, unspecified: Secondary | ICD-10-CM

## 2022-06-10 DIAGNOSIS — R300 Dysuria: Secondary | ICD-10-CM

## 2022-06-10 LAB — COMPREHENSIVE METABOLIC PANEL
ALT: 28 U/L (ref 0–35)
AST: 33 U/L (ref 0–37)
Albumin: 4.1 g/dL (ref 3.5–5.2)
Alkaline Phosphatase: 49 U/L (ref 39–117)
BUN: 24 mg/dL — ABNORMAL HIGH (ref 6–23)
CO2: 25 mEq/L (ref 19–32)
Calcium: 9.5 mg/dL (ref 8.4–10.5)
Chloride: 104 mEq/L (ref 96–112)
Creatinine, Ser: 1.24 mg/dL — ABNORMAL HIGH (ref 0.40–1.20)
GFR: 41.15 mL/min — ABNORMAL LOW (ref >60.00)
Glucose, Bld: 112 mg/dL — ABNORMAL HIGH (ref 70–99)
Potassium: 4.2 mEq/L (ref 3.5–5.1)
Sodium: 138 mEq/L (ref 135–145)
Total Bilirubin: 0.5 mg/dL (ref 0.2–1.2)
Total Protein: 7.4 g/dL (ref 6.0–8.3)

## 2022-06-10 LAB — LIPID PANEL
Cholesterol: 130 mg/dL (ref 0–200)
HDL: 45.4 mg/dL (ref >39.00)
LDL Cholesterol: 63 mg/dL (ref 0–99)
NonHDL: 85.08
Total CHOL/HDL Ratio: 3
Triglycerides: 109 mg/dL (ref 0.0–149.0)
VLDL: 21.8 mg/dL (ref 0.0–40.0)

## 2022-06-10 LAB — POC URINALSYSI DIPSTICK (AUTOMATED)
Bilirubin, UA: NEGATIVE
Blood, UA: NEGATIVE
Glucose, UA: NEGATIVE
Ketones, UA: NEGATIVE
Nitrite, UA: POSITIVE
Protein, UA: NEGATIVE
Spec Grav, UA: 1.01 (ref 1.010–1.025)
Urobilinogen, UA: 0.2 E.U./dL
pH, UA: 6 (ref 5.0–8.0)

## 2022-06-10 LAB — HEMOGLOBIN A1C: Hgb A1c MFr Bld: 6.6 % — ABNORMAL HIGH (ref 4.6–6.5)

## 2022-06-10 MED ORDER — NITROFURANTOIN MONOHYD MACRO 100 MG PO CAPS: 100.0000 mg | ORAL_CAPSULE | Freq: Two times a day (BID) | ORAL | 0 refills | Status: AC

## 2022-06-10 NOTE — Assessment & Plan Note (Signed)
Controlled.  Continue losartan 50 mg daily, HCZT 25 mg daily. CMP pending.

## 2022-06-10 NOTE — Telephone Encounter (Signed)
Can we check on this? 90 day supply sent in early January 2024.

## 2022-06-10 NOTE — Assessment & Plan Note (Signed)
Following with oncology, office notes reviewed from April 2023. Reviewed mammogram from August 2023.

## 2022-06-10 NOTE — Telephone Encounter (Signed)
The pharmacy has this prescription ready for the patient to pickup. Attempted to call patient, unable to reach and unable to leave voicemail.

## 2022-06-10 NOTE — Assessment & Plan Note (Signed)
Reviewed bone density scan from August 2023. Continue alendronate 70 mg weekly.

## 2022-06-10 NOTE — Assessment & Plan Note (Signed)
UA today with 2+ leuks and positive for nitrites. Culture pending.  Given history, coupled with symptoms, will treat.  Start Macrobid (nitrofurantoin) tablets for urinary tract infection. Take 1 tablet by mouth twice daily for five days.  Increase water intake. Await results.

## 2022-06-10 NOTE — Patient Instructions (Signed)
Stop by the lab prior to leaving today. I will notify you of your results once received.   Start Macrobid (nitrofurantoin) tablets for urinary tract infection. Take 1 tablet by mouth twice daily for five days.  Please schedule a follow up visit for 6 months for a diabetes check.  It was a pleasure to see you today!

## 2022-06-10 NOTE — Progress Notes (Signed)
Subjective:    Patient ID: Catherine Tate, female    DOB: [birth year], 82 y.o.   MRN: [id number]  Dysuria  Pertinent negatives include no frequency.    Catherine Tate is a very pleasant 82 y.o. female who presents today for complete physical and follow up of chronic conditions.  She would also like to discuss dysuria. Chronic dysuria, increased symptoms over the last one week, over the last 2 days symptoms have somewhat improved. She denies hematuria, vaginal discharge, vaginal itching. She does have chronic urinary incontinence.   History of acute cystitis, culture positive. Last encounter in our office for cystitis was in August 2023, E coli, no resistance.   Immunizations: -Tetanus: Completed in 2012 -Influenza: Completed this season -Shingles: Completed Zostavax -Pneumonia: Completed Prevnar 13 in 2017 and Pneumovax 23 in 2018  Diet: Saxton.  Exercise: No regular exercise.  Eye exam: Completes annually  Dental exam: Completes semi-annually   Mammogram: Completed in August 2023  Colonoscopy: Completed in 2022, due 2027 Dexa: Completed in August 2023   BP Readings from Last 3 Encounters:  06/10/22 136/78  12/03/21 134/62  11/27/21 (!) 145/72       Review of Systems  Constitutional:  Negative for unexpected weight change.  HENT:  Negative for rhinorrhea.   Respiratory:  Negative for cough and shortness of breath.   Cardiovascular:  Negative for chest pain.  Gastrointestinal:  Negative for constipation and diarrhea.  Genitourinary:  Positive for dysuria. Negative for difficulty urinating, frequency, pelvic pain and vaginal discharge.  Musculoskeletal:  Negative for arthralgias and myalgias.  Skin:  Negative for rash.  Allergic/Immunologic: Negative for environmental allergies.  Neurological:  Negative for dizziness and headaches.  Psychiatric/Behavioral:  The patient is not nervous/anxious.          Past Medical History:  Diagnosis Date   Arthritis     Cancer ([place]) 03/13/2016   INVASIVE MAMMARY CARCINOMA   Cataract 02/03/2018   Cellulitis of left lower extremity 09/23/2021   Chest wall abscess 05/15/2017   Chickenpox    Complication of anesthesia    Frequent UTI    GERD (gastroesophageal reflux disease)    RARE   Hypertension    Personal history of chemotherapy 2017/2018   F/U left breast cancer   Personal history of radiation therapy 2018   F/U left breast cancer   PONV (postoperative nausea and vomiting)    RSD (reflex sympathetic dystrophy)    Seasonal allergies    Skin cancer    Urine incontinence     Social History   Socioeconomic History   Marital status: Single    Spouse name: Not on file   Number of children: Not on file   Years of education: Not on file   Highest education level: Not on file  Occupational History   Occupation: retired  Tobacco Use   Smoking status: Former    Packs/day: 1.00    Years: 20.00    Total pack years: 20.00    Types: Cigarettes    Quit date: 04/14/1994    Years since quitting: 28.1   Smokeless tobacco: Never  Vaping Use   Vaping Use: Never used  Substance and Sexual Activity   Alcohol use: No    Alcohol/week: 0.0 standard drinks of alcohol   Drug use: No   Sexual activity: Not Currently  Other Topics Concern   Not on file  Social History Narrative   Single.   2 children, 1 grandchild.   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   Enjoys making jewelry, puzzle books, sewing, traveling.   Son lives with her   Social Determinants of Health   Financial Resource Strain: Low Risk  (08/14/2021)   Overall Financial Resource Strain (CARDIA)    Difficulty of Paying Living Expenses: Not hard at all  Food Insecurity: No Food Insecurity (08/14/2021)   Hunger Vital Sign    Worried About Running Out of Food in the Last Year: Never true    [place] in the Last Year: Never true  Transportation Needs: No Transportation Needs (08/14/2021)   PRAPARE - Radiographer, therapeutic (Medical): No    Lack of Transportation (Non-Medical): No  Physical Activity: Insufficiently Active (08/14/2021)   Exercise Vital Sign    Days of Exercise per Week: 4 days    Minutes of Exercise per Session: 30 min  Stress: No Stress Concern Present (08/14/2021)   [place]    Feeling of Stress : Not at all  Social Connections: Moderately Integrated (08/14/2021)   Social Connection and Isolation Panel [NHANES]    Frequency of Communication with Friends and Family: More than three times a week    Frequency of Social Gatherings with Friends and Family: More than three times a week    Attends Religious Services: More than 4 times per year    Active Member of Clubs or Organizations: Yes    Attends Archivist Meetings: More than 4 times per year    Marital Status: Never married  Intimate Partner Violence: Not At Risk (08/14/2021)   Humiliation, Afraid, Rape, and Kick questionnaire    Fear of Current or Ex-Partner: No    Emotionally Abused: No    Physically Abused: No    Sexually Abused: No    Past Surgical History:  Procedure Laterality Date   ABDOMINAL HYSTERECTOMY  2005   total/ Dr Davis Gourd   BLADDER TUMOR EXCISION  2006   BREAST BIOPSY Left 03/13/2016   INVASIVE MAMMARY CARCINOMA   BREAST LUMPECTOMY Left 2018   BREAST LUMPECTOMY WITH SENTINEL LYMPH NODE BIOPSY Left 07/14/2016   Procedure: BREAST LUMPECTOMY WITH SENTINEL LYMPH NODE BX;  Surgeon: [name], MD;  Location: [facility] OR[facility];  Service: General;  Laterality: Left;   COLONOSCOPY WITH PROPOFOL N/A 01/02/2021   Procedure: COLONOSCOPY WITH PROPOFOL;  Surgeon: [name], MD;  Location: [facility];  Service: Endoscopy;  Laterality: N/A;   denture surgery     OOPHORECTOMY     PORTACATH PLACEMENT N/A 04/01/2016   Procedure: INSERTION PORT-A-CATH;  Surgeon: [name], MD;  Location: [facility] ORS;  Service: General;  Laterality:  N/A;   SKIN CANCER EXCISION Right 2015   foot/ Dr Nevada Crane   TUBAL LIGATION     TUMOR EXCISION Right    FOOT    Family History  Problem Relation Age of Onset   Heart disease Mother    Transient ischemic attack Mother    Emphysema Father    Breast cancer Sister    Breast cancer Cousin        paternal   Kidney cancer Neg Hx    Bladder Cancer Neg Hx     Allergies  Allergen Reactions   Bee Pollen Hives   Mosquito (Diagnostic) Hives   Other Other (See Comments)    Uncoded Allergy. Allergen: VICRYL  (90% glycolide and 10% L-lactide [polyglactin-- synthetic absorbable sterile surgical suture]), Other Reaction: Infection  Gabapentin Swelling   Mosquito (Culex Pipiens) Allergy Skin Test Hives   Norvasc [Amlodipine Besylate] Swelling    TO FEET   Lisinopril Cough    Current Outpatient Medications on File Prior to Visit  Medication Sig Dispense Refill   acetaminophen (TYLENOL) 500 MG tablet Take 1,000 mg by mouth every 6 (six) hours as needed for moderate pain.      alendronate (FOSAMAX) 70 MG tablet Take 1 tablet (70 mg total) by mouth once a week. Take with a full glass of water on an empty stomach. 12 tablet 3   Ascorbic Acid (VITAMIN C) 1000 MG tablet Take 1,000 mg by mouth daily.     atorvastatin (LIPITOR) 10 MG tablet TAKE 1 TABLET BY MOUTH ONCE A DAY FOR CHOLSTEROL. 90 tablet 0   Calcium Carb-Cholecalciferol (CALCIUM/VITAMIN D PO) Take 1,200 mg by mouth.     CRANBERRY PO Take 1 capsule by mouth 2 (two) times daily.     fluticasone (FLONASE) 50 MCG/ACT nasal spray Place 2 sprays into both nostrils daily. 16 g 6   hydrochlorothiazide (HYDRODIURIL) 25 MG tablet Take 1 tablet (25 mg total) by mouth daily. for blood pressure. 90 tablet 0   losartan (COZAAR) 50 MG tablet TAKE 1 TABLET BY MOUTH ONCE A DAY FOR BLOOD PRESSURE. 90 tablet 0   Probiotic Product (PROBIOTIC PO) Take 1 tablet by mouth daily.     cetirizine (ZYRTEC) 10 MG tablet Take 1 tablet (10 mg total) by mouth daily.  (Patient not taking: Reported on 06/10/2022) 30 tablet 11   No current facility-administered medications on file prior to visit.    BP 136/78   Pulse (!) 103   Temp 98.6 F (37 C) (Temporal)   Ht '5\' 5"'$  (1.651 m)   Wt 157 lb (71.2 kg)   SpO2 98%   BMI 26.13 kg/m  Objective:   Physical Exam HENT:     Right Ear: Tympanic membrane and ear canal normal.     Left Ear: Tympanic membrane and ear canal normal.     Nose: Nose normal.  Eyes:     Conjunctiva/sclera: Conjunctivae normal.     Pupils: Pupils are equal, round, and reactive to light.  Neck:     Thyroid: No thyromegaly.  Cardiovascular:     Rate and Rhythm: Normal rate and regular rhythm.     Heart sounds: No murmur heard. Pulmonary:     Effort: Pulmonary effort is normal.     Breath sounds: Normal breath sounds. No rales.  Abdominal:     General: Bowel sounds are normal.     Palpations: Abdomen is soft.     Tenderness: There is no abdominal tenderness.  Musculoskeletal:        General: Normal range of motion.     Cervical back: Neck supple.  Lymphadenopathy:     Cervical: No cervical adenopathy.  Skin:    General: Skin is warm and dry.     Findings: No rash.  Neurological:     Mental Status: She is alert and oriented to person, place, and time.     Cranial Nerves: No cranial nerve deficit.     Deep Tendon Reflexes: Reflexes are normal and symmetric.  Psychiatric:        Mood and Affect: Mood normal.           Assessment & Plan:  Encounter for annual general medical examination with abnormal findings in adult Assessment & Plan: Immunizations UTD. Mammogram UTD Colonoscopy UTD, due 2027 Bone  density scan UTD.  Discussed the importance of a healthy diet and regular exercise in order for weight loss, and to reduce the risk of further co-morbidity.  Exam stable. Labs pending.  Follow up in 1 year for repeat physical.    Osteoporosis, unspecified osteoporosis type, unspecified pathological fracture  presence Assessment & Plan: Reviewed bone density scan from August 2023. Continue alendronate 70 mg weekly.   History of breast cancer Assessment & Plan: Following with oncology, office notes reviewed from April 2023. Reviewed mammogram from August 2023.   Essential hypertension Assessment & Plan: Controlled.  Continue losartan 50 mg daily, HCZT 25 mg daily. CMP pending.  Orders: -     Comprehensive metabolic panel  Type 2 diabetes mellitus with hyperglycemia, without long-term current use of insulin (HCC) Assessment & Plan: Repeat A1C pending. Remain off treatment.  Follow up in 6 months.  Orders: -     Hemoglobin A1c  Hyperlipidemia, unspecified hyperlipidemia type Assessment & Plan: Continue atorvastatin 10 mg daily. Repeat lipid panel pending.  Orders: -     Lipid panel -     Comprehensive metabolic panel  Dysuria Assessment & Plan: UA today with 2+ leuks and positive for nitrites. Culture pending.  Given history, coupled with symptoms, will treat.  Start Macrobid (nitrofurantoin) tablets for urinary tract infection. Take 1 tablet by mouth twice daily for five days.  Increase water intake. Await results.    Orders: -     POCT Urinalysis Dipstick (Automated) -     Urine Culture -     Nitrofurantoin Monohyd Macro; Take 1 capsule (100 mg total) by mouth 2 (two) times daily for 5 days.  Dispense: 10 capsule; Refill: 0        [name], NP

## 2022-06-10 NOTE — Assessment & Plan Note (Signed)
Immunizations UTD. Mammogram UTD Colonoscopy UTD, due 2027 Bone density scan UTD.  Discussed the importance of a healthy diet and regular exercise in order for weight loss, and to reduce the risk of further co-morbidity.  Exam stable. Labs pending.  Follow up in 1 year for repeat physical.

## 2022-06-10 NOTE — Assessment & Plan Note (Signed)
Continue atorvastatin 10 mg daily. Repeat lipid panel pending.

## 2022-06-10 NOTE — Assessment & Plan Note (Signed)
Repeat A1C pending. Remain off treatment.  Follow up in 6 months.

## 2022-06-10 NOTE — Telephone Encounter (Signed)
Unable to reach patient. Unable to leave voicemail.  Notified patients son to advise patient prescription is ready to be picked up.

## 2022-06-11 ENCOUNTER — Other Ambulatory Visit: Payer: Self-pay | Admitting: Primary Care

## 2022-06-11 DIAGNOSIS — N3000 Acute cystitis without hematuria: Secondary | ICD-10-CM

## 2022-06-13 LAB — URINE CULTURE
MICRO NUMBER:: 14620545
SPECIMEN QUALITY:: ADEQUATE

## 2022-06-17 ENCOUNTER — Telehealth: Payer: Self-pay | Admitting: Primary Care

## 2022-06-17 NOTE — Telephone Encounter (Signed)
Patient needs repeat UA with culture, please set her up to be seen by a provider if she's still experiencing symptoms.

## 2022-06-17 NOTE — Telephone Encounter (Signed)
Patient has been scheduled

## 2022-06-17 NOTE — Telephone Encounter (Signed)
Patient called in and was wondering if she could get a refill of Nitrofurantoin Monohyd Macro. She stated that she is still experiencing some burning but not as bad as it was before. She didn't want to take nothing from OTC if this could be refilled. Please advise. Thank you!

## 2022-06-18 ENCOUNTER — Ambulatory Visit (INDEPENDENT_AMBULATORY_CARE_PROVIDER_SITE_OTHER): Payer: PPO | Admitting: Family Medicine

## 2022-06-18 ENCOUNTER — Encounter: Payer: Self-pay | Admitting: Family Medicine

## 2022-06-18 VITALS — BP 134/72 | HR 103 | Temp 97.5°F | Ht 65.0 in | Wt 158.5 lb

## 2022-06-18 DIAGNOSIS — N390 Urinary tract infection, site not specified: Secondary | ICD-10-CM

## 2022-06-18 DIAGNOSIS — N1831 Chronic kidney disease, stage 3a: Secondary | ICD-10-CM | POA: Diagnosis not present

## 2022-06-18 DIAGNOSIS — R300 Dysuria: Secondary | ICD-10-CM

## 2022-06-18 DIAGNOSIS — N3946 Mixed incontinence: Secondary | ICD-10-CM | POA: Diagnosis not present

## 2022-06-18 LAB — POC URINALSYSI DIPSTICK (AUTOMATED)
Bilirubin, UA: NEGATIVE
Glucose, UA: NEGATIVE
Ketones, UA: NEGATIVE
Nitrite, UA: POSITIVE
Protein, UA: NEGATIVE
Spec Grav, UA: 1.015 (ref 1.010–1.025)
Urobilinogen, UA: 0.2 E.U./dL
pH, UA: 6 (ref 5.0–8.0)

## 2022-06-18 MED ORDER — CEPHALEXIN 500 MG PO CAPS: 500.0000 mg | ORAL_CAPSULE | Freq: Two times a day (BID) | ORAL | 0 refills | Status: AC

## 2022-06-18 MED ORDER — CEPHALEXIN 500 MG PO CAPS: 500.0000 mg | ORAL_CAPSULE | Freq: Two times a day (BID) | ORAL | 0 refills | Status: DC

## 2022-06-18 NOTE — Progress Notes (Signed)
Patient ID: BRETTANY TYRRELL, female    DOB: 1940/06/10, 82 y.o.   MRN: [id number]  This visit was conducted in person.  BP 134/72   Pulse (!) 103   Temp (!) 97.5 F (36.4 C) (Temporal)   Ht '5\' 5"'$  (1.651 m)   Wt 158 lb 8 oz (71.9 kg)   SpO2 95%   BMI 26.38 kg/m    CC: ?recurrent UTI Subjective:   HPI: Catherine Tate is a 82 y.o. female presenting on 06/18/2022 for Dysuria (C/o ongoing burning when urinating. Seen/treated recently for UTI. )   Saw PCP last week for CPE, at that time had UTI symptoms of chronic dysuria acutely worse x 1 wk, abnormal UA with UCx growing >100k pansensitivie E coli, treated with macrobid 5d course.   Symptoms were improving on treatment but notes ongoing dysuria worse in evenings.  She does not urinate normally - has chronic leaking normally wears depends and pad - changes Q3-4 hours.  No urge to go to the bathroom.  No vaginal discharge or vulvar rash, no hematuria, flank pain, fevers/chills, nausea/vomiting or lower abd pain.   Prior UTI 11/2021.  Chronic h/o incontinence with chronic leaking for 15-20 yrs - "weak urethra". She has seen bladder specialist - no treatment available.  She takes cranberry tablet daily.  She is on hctz '25mg'$  daily.   Last saw [facility] 2018     Relevant past medical, surgical, family and social history reviewed and updated as indicated. Interim medical history since our last visit reviewed. Allergies and medications reviewed and updated. Outpatient Medications Prior to Visit  Medication Sig Dispense Refill   acetaminophen (TYLENOL) 500 MG tablet Take 1,000 mg by mouth every 6 (six) hours as needed for moderate pain.      alendronate (FOSAMAX) 70 MG tablet Take 1 tablet (70 mg total) by mouth once a week. Take with a full glass of water on an empty stomach. 12 tablet 3   Ascorbic Acid (VITAMIN C) 1000 MG tablet Take 1,000 mg by mouth daily.     atorvastatin (LIPITOR) 10 MG tablet TAKE 1 TABLET BY MOUTH ONCE A  DAY FOR CHOLSTEROL. 90 tablet 0   Calcium Carb-Cholecalciferol (CALCIUM/VITAMIN D PO) Take 1,200 mg by mouth.     cetirizine (ZYRTEC) 10 MG tablet Take 1 tablet (10 mg total) by mouth daily. 30 tablet 11   CRANBERRY PO Take 1 capsule by mouth 2 (two) times daily.     fluticasone (FLONASE) 50 MCG/ACT nasal spray Place 2 sprays into both nostrils daily. 16 g 6   hydrochlorothiazide (HYDRODIURIL) 25 MG tablet Take 1 tablet (25 mg total) by mouth daily. for blood pressure. 90 tablet 0   losartan (COZAAR) 50 MG tablet TAKE 1 TABLET BY MOUTH ONCE A DAY FOR BLOOD PRESSURE. 90 tablet 0   Probiotic Product (PROBIOTIC PO) Take 1 tablet by mouth daily.     No facility-administered medications prior to visit.     Per HPI unless specifically indicated in ROS section below Review of Systems  Objective:  BP 134/72   Pulse (!) 103   Temp (!) 97.5 F (36.4 C) (Temporal)   Ht '5\' 5"'$  (1.651 m)   Wt 158 lb 8 oz (71.9 kg)   SpO2 95%   BMI 26.38 kg/m   Wt Readings from Last 3 Encounters:  06/18/22 158 lb 8 oz (71.9 kg)  06/10/22 157 lb (71.2 kg)  12/03/21 151 lb (68.5 kg)  Physical Exam Vitals and nursing note reviewed.  Constitutional:      Appearance: Normal appearance. She is not ill-appearing.  Abdominal:     General: Bowel sounds are normal. There is no distension.     Palpations: Abdomen is soft. There is no mass.     Tenderness: There is no abdominal tenderness. There is no right CVA tenderness, left CVA tenderness, guarding or rebound. Negative signs include Murphy's sign.     Hernia: No hernia is present.  Genitourinary:    Comments: GYN exam declined Musculoskeletal:     Right lower leg: No edema.     Left lower leg: No edema.  Skin:    General: Skin is warm and dry.     Findings: No rash.  Neurological:     Mental Status: She is alert.  Psychiatric:        Mood and Affect: Mood normal.        Behavior: Behavior normal.       Results for orders placed or performed in  visit on 06/18/22  POCT Urinalysis Dipstick (Automated)  Result Value Ref Range   Color, UA yellow    Clarity, UA cloudy    Glucose, UA Negative Negative   Bilirubin, UA negative    Ketones, UA negative    Spec Grav, UA 1.015 1.010 - 1.025   Blood, UA +/-    pH, UA 6.0 5.0 - 8.0   Protein, UA Negative Negative   Urobilinogen, UA 0.2 0.2 or 1.0 E.U./dL   Nitrite, UA positive    Leukocytes, UA Large (3+) (A) Negative   Lab Results  Component Value Date   CREATININE 1.24 (H) 06/10/2022   BUN 24 (H) 06/10/2022   NA 138 06/10/2022   K 4.2 06/10/2022   CL 104 06/10/2022   CO2 25 06/10/2022   GFR = 44  Assessment & Plan:   Problem List Items Addressed This Visit     Recurrent UTI - Primary    Ongoing symptoms despite 5d macrobid course. UCx with pansensitive E coli. Rx 7d keflex course, update if ongoing symptoms. Rec avoid bladder irritants, may take tylenol PRN discomfort. Push fluids and rest.       Relevant Medications   cephALEXin (KEFLEX) 500 MG capsule   Urinary incontinence, mixed    Chronic.This provides additional level of complexity.       Dysuria   Relevant Orders   POCT Urinalysis Dipstick (Automated) (Completed)   Urine Culture   Chronic kidney disease (CKD), stage III (moderate)     Meds ordered this encounter  Medications   DISCONTD: cephALEXin (KEFLEX) 500 MG capsule    Sig: Take 1 capsule (500 mg total) by mouth 2 (two) times daily for 7 days.    Dispense:  14 capsule    Refill:  0   cephALEXin (KEFLEX) 500 MG capsule    Sig: Take 1 capsule (500 mg total) by mouth 2 (two) times daily for 7 days.    Dispense:  14 capsule    Refill:  0    Orders Placed This Encounter  Procedures   Urine Culture   POCT Urinalysis Dipstick (Automated)    Patient Instructions  You likely have persistent urine infection - take 7 day course of keflex (cephalexin) sent to pharmacy. Let us know if ongoing symptoms after finishing course.  Good to see you today.   Push fluids and rest. May take tylenol for discomfort. Avoid dark sodas, caffeine.   Follow up plan:  Return if symptoms worsen or fail to improve.  Ria Bush, MD

## 2022-06-18 NOTE — Assessment & Plan Note (Signed)
Chronic.This provides additional level of complexity.

## 2022-06-18 NOTE — Patient Instructions (Addendum)
You likely have persistent urine infection - take 7 day course of keflex (cephalexin) sent to pharmacy. Let us know if ongoing symptoms after finishing course.  Good to see you today.  Push fluids and rest. May take tylenol for discomfort. Avoid dark sodas, caffeine.

## 2022-06-18 NOTE — Assessment & Plan Note (Addendum)
Ongoing symptoms despite 5d macrobid course. UCx with pansensitive E coli. Rx 7d keflex course, update if ongoing symptoms. Rec avoid bladder irritants, may take tylenol PRN discomfort. Push fluids and rest.

## 2022-06-20 LAB — URINE CULTURE
MICRO NUMBER:: 14657047
SPECIMEN QUALITY:: ADEQUATE

## 2022-06-25 ENCOUNTER — Other Ambulatory Visit: Payer: Self-pay | Admitting: Primary Care

## 2022-06-25 DIAGNOSIS — N390 Urinary tract infection, site not specified: Secondary | ICD-10-CM

## 2022-07-01 ENCOUNTER — Ambulatory Visit (INDEPENDENT_AMBULATORY_CARE_PROVIDER_SITE_OTHER): Payer: PPO | Admitting: Primary Care

## 2022-07-01 ENCOUNTER — Encounter: Payer: Self-pay | Admitting: Primary Care

## 2022-07-01 VITALS — BP 136/78 | HR 115 | Temp 98.5°F | Ht 65.0 in | Wt 158.0 lb

## 2022-07-01 DIAGNOSIS — R300 Dysuria: Secondary | ICD-10-CM

## 2022-07-01 DIAGNOSIS — M25562 Pain in left knee: Secondary | ICD-10-CM | POA: Diagnosis not present

## 2022-07-01 DIAGNOSIS — N3946 Mixed incontinence: Secondary | ICD-10-CM

## 2022-07-01 DIAGNOSIS — N1831 Chronic kidney disease, stage 3a: Secondary | ICD-10-CM | POA: Diagnosis not present

## 2022-07-01 LAB — POC URINALSYSI DIPSTICK (AUTOMATED)
Bilirubin, UA: NEGATIVE
Blood, UA: NEGATIVE
Glucose, UA: NEGATIVE
Ketones, UA: NEGATIVE
Nitrite, UA: NEGATIVE
Protein, UA: NEGATIVE
Spec Grav, UA: 1.015 (ref 1.010–1.025)
Urobilinogen, UA: 0.2 E.U./dL
pH, UA: 6 (ref 5.0–8.0)

## 2022-07-01 MED ORDER — OXYBUTYNIN CHLORIDE 5 MG PO TABS: 5.0000 mg | ORAL_TABLET | Freq: Two times a day (BID) | ORAL | 0 refills | Status: DC

## 2022-07-01 NOTE — Progress Notes (Signed)
Established Patient Office Visit  Subjective   Patient ID: Catherine Tate, female    DOB: 1940-07-11  Age: 82 y.o. MRN: [id number]  Chief Complaint  Patient presents with   Medical Management of Chronic Issues    Recurrent UTI follow up Finished course of ABX that Dr. Darnell Level prescribed, she states she felt better until she finished them and ow she still has slight burning with urination   Knee Pain    Left knee started hurting last night. She felt a pop that night and then it was hard to stand on it or move. She put knee brace on this morning and took tylenol and it is slightly better.     Knee Pain    Catherine Tate is a 82 year old female with past medical history of hypertension, type 2 diabetes, osteoporosis, recurrent UTI, CKD stage 3, HLD presents today for a follow up on UTI.   She was evaluated on 06/10/22 and urine culture showed e.coli and she was treated with 5 days of Macrobid. She was evaluated again on Dr. Danise Mina on 06/18/22 and was started on Keflex 500 mg BID for seven days. Urine culture showed panresistant E.Coli.  Since then, she has completed the course. She has a history of incontinence and wears a depends and pads, and reports that is why she keeps getting these infection. She reports drinking two cups of coffee daily and avoids sodas. Drinks 4 16 oz bottled water on average.   She denies any urinary or vaginal symptoms. She has intermittent urinary burning is still there but other symptoms have resolved.   Left Knee pain: Started last night, felt something pop, while standing. When she sat down, pain was better. She couldn't want walk last night however, she is able to today. She put her knee brace on this morning and took two tylenol this morning and her pain is much bette. She denies any rash. She can bear weight and is able to walk with no limitation as long as she has the brace on. Denies any fall or any trauma.   Patient Active Problem List   Diagnosis Date Noted    Acute pain of left knee 07/01/2022   Seasonal allergic rhinitis due to pollen 09/23/2021   Type 2 diabetes mellitus with hyperglycemia ([place]) 02/16/2019   Tachycardia 02/16/2019   Chronic kidney disease (CKD), stage III (moderate) 02/10/2018   Osteoporosis 02/10/2018   Dysuria 10/19/2017   Encounter for annual general medical examination with abnormal findings in adult 02/09/2017   Hyperlipidemia 02/09/2017   Malignant neoplasm of upper-outer quadrant of left breast in female, estrogen receptor positive ([place]) 07/21/2016   History of breast cancer 03/19/2016   Medicare annual wellness visit, subsequent 11/05/2015   Essential hypertension 05/10/2015   Recurrent UTI 05/10/2015   Urinary incontinence, mixed 05/10/2015   Past Medical History:  Diagnosis Date   Arthritis    Cancer ([place]) 03/13/2016   INVASIVE MAMMARY CARCINOMA   Cataract 02/03/2018   Cellulitis of left lower extremity 09/23/2021   Chest wall abscess 05/15/2017   Chickenpox    Complication of anesthesia    Frequent UTI    GERD (gastroesophageal reflux disease)    RARE   Hypertension    Personal history of chemotherapy 2017/2018   F/U left breast cancer   Personal history of radiation therapy 2018   F/U left breast cancer   PONV (postoperative nausea and vomiting)    RSD (reflex sympathetic dystrophy)    Seasonal allergies  Skin cancer    Urine incontinence       Review of Systems  Constitutional:  Negative for chills and fever.  Respiratory:  Negative for shortness of breath.   Cardiovascular:  Negative for chest pain.  Gastrointestinal:  Negative for nausea and vomiting.  Genitourinary:  Negative for dysuria, flank pain, frequency, hematuria and urgency.       Urinary incontinence and Intermittent burning   Neurological:  Negative for dizziness and headaches.      Objective:     BP 136/78   Pulse (!) 115   Temp 98.5 F (36.9 C) (Temporal)   Ht 5\' 5"  (1.651 m)   Wt 158 lb (71.7 kg)   SpO2 98%    BMI 26.29 kg/m  BP Readings from Last 3 Encounters:  07/01/22 136/78  06/18/22 134/72  06/10/22 136/78   Wt Readings from Last 3 Encounters:  07/01/22 158 lb (71.7 kg)  06/18/22 158 lb 8 oz (71.9 kg)  06/10/22 157 lb (71.2 kg)      Physical Exam Vitals and nursing note reviewed.  Constitutional:      Appearance: Normal appearance.  Cardiovascular:     Rate and Rhythm: Regular rhythm. Tachycardia present.     Pulses: Normal pulses.     Heart sounds: Normal heart sounds.  Pulmonary:     Effort: Pulmonary effort is normal.     Breath sounds: Normal breath sounds.  Musculoskeletal:        General: No swelling, tenderness or signs of injury. Normal range of motion.  Skin:    General: Skin is warm and dry.  Neurological:     Mental Status: She is alert and oriented to person, place, and time.  Psychiatric:        Mood and Affect: Mood normal.        Behavior: Behavior normal.      Results for orders placed or performed in visit on 07/01/22  POCT Urinalysis Dipstick (Automated)  Result Value Ref Range   Color, UA yellow    Clarity, UA clear    Glucose, UA Negative Negative   Bilirubin, UA neg    Ketones, UA neg    Spec Grav, UA 1.015 1.010 - 1.025   Blood, UA neg    pH, UA 6.0 5.0 - 8.0   Protein, UA Negative Negative   Urobilinogen, UA 0.2 0.2 or 1.0 E.U./dL   Nitrite, UA neg    Leukocytes, UA Large (3+) (A) Negative       The ASCVD Risk score (Arnett DK, et al., 2019) failed to calculate for the following reasons:   The 2019 ASCVD risk score is only valid for ages 79 to 69    Assessment & Plan:   Problem List Items Addressed This Visit       Genitourinary   Chronic kidney disease (CKD), stage III (moderate)    Repeat Bmp pending.   Continue losartan for renal function and BP.       Relevant Orders   Basic Metabolic Panel   CBC with Differential     Other   Urinary incontinence, mixed    Chronic history of incontinence.   Has seen urologist  in 2019 and recommended Oxybuytnin; but did not start at that time since she was doing chemo at the time.   Agreeable to start treatment now.   Start Oxybutynin IR 5 mg two times daily as needed.  Discussed side effects with patient.   She will update in two  weeks.       Relevant Medications   oxybutynin (DITROPAN) 5 MG tablet   Dysuria - Primary    UA today with  3+ leuks.  Culture pending.   Continue increased water intake.  Await results.   Long discussion with patient regarding urinary incontinence and it could be contributing to recurrent UTIs.   Patient agreeable to start treatment for incontinence.        Relevant Orders   Urine Culture   POCT Urinalysis Dipstick (Automated) (Completed)   Basic Metabolic Panel   CBC with Differential   Acute pain of left knee    Unclear etiology. No red flag symptoms on exam.  Symptoms resolved at this time.  Continue brace and tylenol as needed.   She will update if symptoms return.        No follow-ups on file.    Tinnie Gens, BSN-RN, DNP STUDENT

## 2022-07-01 NOTE — Assessment & Plan Note (Signed)
Repeat Bmp pending.   Continue losartan for renal function and BP.

## 2022-07-01 NOTE — Assessment & Plan Note (Addendum)
Unclear etiology. No red flag symptoms on exam.  Symptoms resolved at this time.  Continue brace and tylenol as needed.   She will update if symptoms return.   I evaluated patient, was consulted regarding treatment, and agree with assessment and plan per Tinnie Gens, RN, DNP student.   Allie Bossier, NP-C

## 2022-07-01 NOTE — Assessment & Plan Note (Addendum)
UA today with  3+ leuks.  Culture pending.   Collecting CBC and BMP given tachcardia in the setting of recurrent cystitis. Lower suspicion for acute pyelonephritis.     Continue increased water intake.  Await results.   Long discussion with patient regarding urinary incontinence and it could be contributing to recurrent UTIs.   Patient agreeable to start treatment for incontinence. See notes under urinary incontinence.   I evaluated patient, was consulted regarding treatment, and agree with assessment and plan per Tinnie Gens, RN, DNP student.   Allie Bossier, NP-C

## 2022-07-01 NOTE — Progress Notes (Signed)
Subjective:    Patient ID: Catherine Tate, female    DOB: 01-03-1941, 82 y.o.   MRN: [id number]  Knee Pain     Catherine Tate is a very pleasant 82 y.o. female with a history of recurrent cystitis culture positive, breast cancer, hypertension, CKD, urinary incontinence who presents today for follow up of UTI and to discuss knee pain.  1) Recurrent Cystitis: She was originally evaluated by me on 06/10/22 for her annual exam when she mentioned UTI symptoms. Urine culture was E coli positive with pan sensitivity. She was treated with Macrobid 100 mg BID x 5 days.   Symptoms returned and was evaluated by Dr. Danise Mina on 06/18/22. Urine culture was E coli positive with pan sensitivity. She was treated with cephalexin 500 mg BID 7 days.   Today she mentions that symptoms improved during treatment, but returned this morning with dysuria.   She denies fevers, hematuria, abdominal pain, urinary frequency. She does struggle with urinary incontinence, wears depends during the day and evening and does soak her depends at times. Evaluated by Urology in the past, was told that she was not a candidate for bladder tacking. She was prescribed Oxybutynin several times but she refused treatment.   2) Knee Pain: Acute to the left knee beginning last night. Last night she felt a "pop" and noticed difficulty putting weight onto her left knee/lower extremity while getting up from a chair in the kitchen.   She began using a knee brace and took some Tylenol, has noticed significant improvement. She is able to bear weight and walk without difficulty.      Review of Systems  Constitutional:  Negative for fever.  Gastrointestinal:  Negative for abdominal pain.  Genitourinary:  Positive for dysuria. Negative for frequency, hematuria and vaginal discharge.  Musculoskeletal:  Positive for arthralgias.         Past Medical History:  Diagnosis Date   Arthritis    Cancer ([place]) 03/13/2016   INVASIVE MAMMARY  CARCINOMA   Cataract 02/03/2018   Cellulitis of left lower extremity 09/23/2021   Chest wall abscess 05/15/2017   Chickenpox    Complication of anesthesia    Frequent UTI    GERD (gastroesophageal reflux disease)    RARE   Hypertension    Personal history of chemotherapy 2017/2018   F/U left breast cancer   Personal history of radiation therapy 2018   F/U left breast cancer   PONV (postoperative nausea and vomiting)    RSD (reflex sympathetic dystrophy)    Seasonal allergies    Skin cancer    Urine incontinence     Social History   Socioeconomic History   Marital status: Single    Spouse name: Not on file   Number of children: Not on file   Years of education: Not on file   Highest education level: Not on file  Occupational History   Occupation: retired  Tobacco Use   Smoking status: Former    Packs/day: 1.00    Years: 20.00    Additional pack years: 0.00    Total pack years: 20.00    Types: Cigarettes    Quit date: 04/14/1994    Years since quitting: 28.2   Smokeless tobacco: Never  Vaping Use   Vaping Use: Never used  Substance and Sexual Activity   Alcohol use: No    Alcohol/week: 0.0 standard drinks of alcohol   Drug use: No   Sexual activity: Not Currently  Other Topics Concern  Not on file  Social History Narrative   Single.   2 children, 1 grandchild.   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   Enjoys making jewelry, puzzle books, sewing, traveling.   Son lives with her   Social Determinants of Health   Financial Resource Strain: Low Risk  (08/14/2021)   Overall Financial Resource Strain (CARDIA)    Difficulty of Paying Living Expenses: Not hard at all  Food Insecurity: No Food Insecurity (08/14/2021)   Hunger Vital Sign    Worried About Running Out of Food in the Last Year: Never true    [place] in the Last Year: Never true  Transportation Needs: No Transportation Needs (08/14/2021)   PRAPARE - Hydrologist  (Medical): No    Lack of Transportation (Non-Medical): No  Physical Activity: Insufficiently Active (08/14/2021)   Exercise Vital Sign    Days of Exercise per Week: 4 days    Minutes of Exercise per Session: 30 min  Stress: No Stress Concern Present (08/14/2021)   [place]    Feeling of Stress : Not at all  Social Connections: Moderately Integrated (08/14/2021)   Social Connection and Isolation Panel [NHANES]    Frequency of Communication with Friends and Family: More than three times a week    Frequency of Social Gatherings with Friends and Family: More than three times a week    Attends Religious Services: More than 4 times per year    Active Member of Clubs or Organizations: Yes    Attends Archivist Meetings: More than 4 times per year    Marital Status: Never married  Intimate Partner Violence: Not At Risk (08/14/2021)   Humiliation, Afraid, Rape, and Kick questionnaire    Fear of Current or Ex-Partner: No    Emotionally Abused: No    Physically Abused: No    Sexually Abused: No    Past Surgical History:  Procedure Laterality Date   ABDOMINAL HYSTERECTOMY  2005   total/ Dr Davis Gourd   BLADDER TUMOR EXCISION  2006   BREAST BIOPSY Left 03/13/2016   INVASIVE MAMMARY CARCINOMA   BREAST LUMPECTOMY Left 2018   BREAST LUMPECTOMY WITH SENTINEL LYMPH NODE BIOPSY Left 07/14/2016   Procedure: BREAST LUMPECTOMY WITH SENTINEL LYMPH NODE BX;  Surgeon: [name], MD;  Location: [facility] ORS;  Service: General;  Laterality: Left;   COLONOSCOPY WITH PROPOFOL N/A 01/02/2021   Procedure: COLONOSCOPY WITH PROPOFOL;  Surgeon: [name], MD;  Location: [facility] ENDOSCOPY;  Service: Endoscopy;  Laterality: N/A;   denture surgery     OOPHORECTOMY     PORTACATH PLACEMENT N/A 04/01/2016   Procedure: INSERTION PORT-A-CATH;  Surgeon: [name], MD;  Location: [facility] ORS;  Service: General;  Laterality: N/A;   SKIN  CANCER EXCISION Right 2015   foot/ Dr Nevada Crane   TUBAL LIGATION     TUMOR EXCISION Right    FOOT    Family History  Problem Relation Age of Onset   Heart disease Mother    Transient ischemic attack Mother    Emphysema Father    Breast cancer Sister    Breast cancer Cousin        paternal   Kidney cancer Neg Hx    Bladder Cancer Neg Hx     Allergies  Allergen Reactions   Bee Pollen Hives   Mosquito (Diagnostic) Hives   Other Other (See Comments)  Uncoded Allergy. Allergen: VICRYL  (90% glycolide and 10% L-lactide [polyglactin-- synthetic absorbable sterile surgical suture]), Other Reaction: Infection   Gabapentin Swelling   Mosquito (Culex Pipiens) Allergy Skin Test Hives   Norvasc [Amlodipine Besylate] Swelling    TO FEET   Lisinopril Cough    Current Outpatient Medications on File Prior to Visit  Medication Sig Dispense Refill   acetaminophen (TYLENOL) 500 MG tablet Take 1,000 mg by mouth every 6 (six) hours as needed for moderate pain.      alendronate (FOSAMAX) 70 MG tablet Take 1 tablet (70 mg total) by mouth once a week. Take with a full glass of water on an empty stomach. 12 tablet 3   Ascorbic Acid (VITAMIN C) 1000 MG tablet Take 1,000 mg by mouth daily.     atorvastatin (LIPITOR) 10 MG tablet TAKE 1 TABLET BY MOUTH ONCE A DAY FOR CHOLSTEROL. 90 tablet 0   Calcium Carb-Cholecalciferol (CALCIUM/VITAMIN D PO) Take 1,200 mg by mouth.     cetirizine (ZYRTEC) 10 MG tablet Take 1 tablet (10 mg total) by mouth daily. 30 tablet 11   CRANBERRY PO Take 1 capsule by mouth 2 (two) times daily.     fluticasone (FLONASE) 50 MCG/ACT nasal spray Place 2 sprays into both nostrils daily. 16 g 6   hydrochlorothiazide (HYDRODIURIL) 25 MG tablet Take 1 tablet (25 mg total) by mouth daily. for blood pressure. 90 tablet 0   losartan (COZAAR) 50 MG tablet TAKE 1 TABLET BY MOUTH ONCE A DAY FOR BLOOD PRESSURE. 90 tablet 0   Probiotic Product (PROBIOTIC PO) Take 1 tablet by mouth daily.      No current facility-administered medications on file prior to visit.    BP 136/78   Pulse (!) 115   Temp 98.5 F (36.9 C) (Temporal)   Ht 5\' 5"  (1.651 m)   Wt 158 lb (71.7 kg)   SpO2 98%   BMI 26.29 kg/m  Objective:   Physical Exam Cardiovascular:     Rate and Rhythm: Normal rate and regular rhythm.  Pulmonary:     Effort: Pulmonary effort is normal.     Breath sounds: Normal breath sounds.  Abdominal:     Tenderness: There is no right CVA tenderness or left CVA tenderness.  Musculoskeletal:     Cervical back: Neck supple.  Skin:    General: Skin is warm and dry.           Assessment & Plan:  Dysuria Assessment & Plan: UA today with  3+ leuks.  Culture pending.   Collecting CBC and BMP given tachcardia in the setting of recurrent cystitis. Lower suspicion for acute pyelonephritis.     Continue increased water intake.  Await results.   Long discussion with patient regarding urinary incontinence and it could be contributing to recurrent UTIs.   Patient agreeable to start treatment for incontinence. See notes under urinary incontinence.   I evaluated patient, was consulted regarding treatment, and agree with assessment and plan per Tinnie Gens, RN, DNP student.   Allie Bossier, NP-C     Orders: -     Urine Culture -     POCT Urinalysis Dipstick (Automated) -     Basic metabolic panel -     CBC with Differential/Platelet  Acute pain of left knee Assessment & Plan: Unclear etiology. No red flag symptoms on exam.  Symptoms resolved at this time.  Continue brace and tylenol as needed.   She will update if symptoms return.  I evaluated patient, was consulted regarding treatment, and agree with assessment and plan per Tinnie Gens, RN, DNP student.   Allie Bossier, NP-C    Stage 3a chronic kidney disease [facility]) Assessment & Plan: Repeat Bmp pending.   Continue losartan for renal function and BP.   Orders: -     Basic metabolic panel -     CBC  with Differential/Platelet  Urinary incontinence, mixed Assessment & Plan: Chronic history of incontinence for which is contributing to recurrent cystitis.    She ready for treatment now. Start Oxybutynin IR 5 mg two times daily as needed.  Discussed side effects with patient.   She will update in two weeks.   I evaluated patient, was consulted regarding treatment, and agree with assessment and plan per Tinnie Gens, RN, DNP student.   Allie Bossier, NP-C   Orders: -     oxyBUTYnin Chloride; Take 1 tablet (5 mg total) by mouth 2 (two) times daily. For urinary incontinence.  Dispense: 180 tablet; Refill: 0        [name], NP

## 2022-07-01 NOTE — Assessment & Plan Note (Addendum)
Chronic history of incontinence for which is contributing to recurrent cystitis.    She ready for treatment now. Start Oxybutynin IR 5 mg two times daily as needed.  Discussed side effects with patient.   She will update in two weeks.   I evaluated patient, was consulted regarding treatment, and agree with assessment and plan per Tinnie Gens, RN, DNP student.   Allie Bossier, NP-C

## 2022-07-01 NOTE — Patient Instructions (Addendum)
Stop by the lab prior to leaving today. I will notify you of your results once received.   Start oxybutynin 5 mg two times daily as needed.   Please update in two weeks.   It was a pleasure to see you today!

## 2022-07-02 LAB — BASIC METABOLIC PANEL
BUN: 20 mg/dL (ref 6–23)
CO2: 26 mEq/L (ref 19–32)
Calcium: 9.2 mg/dL (ref 8.4–10.5)
Chloride: 99 mEq/L (ref 96–112)
Creatinine, Ser: 1.12 mg/dL (ref 0.40–1.20)
GFR: 46.47 mL/min — ABNORMAL LOW (ref >60.00)
Glucose, Bld: 128 mg/dL — ABNORMAL HIGH (ref 70–99)
Potassium: 4.3 mEq/L (ref 3.5–5.1)
Sodium: 134 mEq/L — ABNORMAL LOW (ref 135–145)

## 2022-07-02 LAB — CBC WITH DIFFERENTIAL/PLATELET
Basophils Absolute: 0.1 10*3/uL (ref 0.0–0.1)
Basophils Relative: 1.1 % (ref 0.0–3.0)
Eosinophils Absolute: 0.1 10*3/uL (ref 0.0–0.7)
Eosinophils Relative: 1.6 % (ref 0.0–5.0)
HCT: 36.8 % (ref 36.0–46.0)
Hemoglobin: 12.5 g/dL (ref 12.0–15.0)
Lymphocytes Relative: 26.2 % (ref 12.0–46.0)
Lymphs Abs: 1.7 10*3/uL (ref 0.7–4.0)
MCHC: 34 g/dL (ref 30.0–36.0)
MCV: 92.2 fl (ref 78.0–100.0)
Monocytes Absolute: 0.4 10*3/uL (ref 0.1–1.0)
Monocytes Relative: 6.5 % (ref 3.0–12.0)
Neutro Abs: 4.1 10*3/uL (ref 1.4–7.7)
Neutrophils Relative %: 64.6 % (ref 43.0–77.0)
Platelets: 233 10*3/uL (ref 150.0–400.0)
RBC: 3.99 Mil/uL (ref 3.87–5.11)
RDW: 12.6 % (ref 11.5–15.5)
WBC: 6.4 10*3/uL (ref 4.0–10.5)

## 2022-07-03 ENCOUNTER — Other Ambulatory Visit: Payer: Self-pay | Admitting: Primary Care

## 2022-07-03 DIAGNOSIS — N3000 Acute cystitis without hematuria: Secondary | ICD-10-CM

## 2022-07-03 LAB — URINE CULTURE
MICRO NUMBER:: 14711638
SPECIMEN QUALITY:: ADEQUATE

## 2022-07-03 MED ORDER — SULFAMETHOXAZOLE-TRIMETHOPRIM 800-160 MG PO TABS: 1.0000 | ORAL_TABLET | Freq: Two times a day (BID) | ORAL | 0 refills | Status: DC

## 2022-07-09 ENCOUNTER — Other Ambulatory Visit: Payer: PPO

## 2022-07-10 ENCOUNTER — Telehealth: Payer: Self-pay | Admitting: Primary Care

## 2022-07-10 DIAGNOSIS — N3000 Acute cystitis without hematuria: Secondary | ICD-10-CM

## 2022-07-10 MED ORDER — ONDANSETRON 4 MG PO TBDP: 4.0000 mg | ORAL_TABLET | Freq: Three times a day (TID) | ORAL | 0 refills | Status: DC | PRN

## 2022-07-10 NOTE — Telephone Encounter (Signed)
Patient has been taking sulfamethoxazole-trimethoprim (BACTRIM DS) 800-160 MG tablet for about a week, called in and complains of upset stomach, nausea decreased appetite for the past few days. Patient is requesting a medication for nausea be called in for them to take. Please advise at [phone].

## 2022-07-10 NOTE — Telephone Encounter (Signed)
Called and spoke to pt. I advised her that there is a possibility insurance will not want to cover it because it can be considered a high risk med for someone over 65. She may need to pay out of pocket if she wants the medication.

## 2022-07-10 NOTE — Telephone Encounter (Signed)
Noted.  Notify patient that antinausea medication called Zofran sent to pharmacy.

## 2022-07-14 NOTE — Telephone Encounter (Signed)
Patient called in and stated she is still experiencing some nausea and that her UTI still isn't cleared up. She stated that she barely slept last night. Please advise. Thank you!

## 2022-07-15 NOTE — Telephone Encounter (Signed)
Patient needs re-evaluation. Please schedule.

## 2022-07-15 NOTE — Telephone Encounter (Signed)
Patient is scheduled for tomorrow.

## 2022-07-16 ENCOUNTER — Telehealth: Payer: Self-pay

## 2022-07-16 ENCOUNTER — Encounter: Payer: Self-pay | Admitting: Primary Care

## 2022-07-16 ENCOUNTER — Ambulatory Visit (INDEPENDENT_AMBULATORY_CARE_PROVIDER_SITE_OTHER): Payer: PPO | Admitting: Primary Care

## 2022-07-16 ENCOUNTER — Other Ambulatory Visit: Payer: Self-pay | Admitting: Primary Care

## 2022-07-16 VITALS — BP 138/68 | HR 105 | Temp 98.2°F | Ht 65.0 in | Wt 159.0 lb

## 2022-07-16 DIAGNOSIS — N390 Urinary tract infection, site not specified: Secondary | ICD-10-CM | POA: Diagnosis not present

## 2022-07-16 DIAGNOSIS — N3946 Mixed incontinence: Secondary | ICD-10-CM

## 2022-07-16 DIAGNOSIS — R197 Diarrhea, unspecified: Secondary | ICD-10-CM | POA: Insufficient documentation

## 2022-07-16 DIAGNOSIS — E871 Hypo-osmolality and hyponatremia: Secondary | ICD-10-CM

## 2022-07-16 DIAGNOSIS — R350 Frequency of micturition: Secondary | ICD-10-CM | POA: Diagnosis not present

## 2022-07-16 LAB — CBC WITH DIFFERENTIAL/PLATELET
Basophils Absolute: 0 10*3/uL (ref 0.0–0.1)
Basophils Relative: 0.3 % (ref 0.0–3.0)
Eosinophils Absolute: 0 10*3/uL (ref 0.0–0.7)
Eosinophils Relative: 0.4 % (ref 0.0–5.0)
HCT: 34.4 % — ABNORMAL LOW (ref 36.0–46.0)
Hemoglobin: 12.2 g/dL (ref 12.0–15.0)
Lymphocytes Relative: 20.4 % (ref 12.0–46.0)
Lymphs Abs: 1.5 10*3/uL (ref 0.7–4.0)
MCHC: 35.4 g/dL (ref 30.0–36.0)
MCV: 90.2 fl (ref 78.0–100.0)
Monocytes Absolute: 0.4 10*3/uL (ref 0.1–1.0)
Monocytes Relative: 5.8 % (ref 3.0–12.0)
Neutro Abs: 5.3 10*3/uL (ref 1.4–7.7)
Neutrophils Relative %: 73.1 % (ref 43.0–77.0)
Platelets: 258 10*3/uL (ref 150.0–400.0)
RBC: 3.81 Mil/uL — ABNORMAL LOW (ref 3.87–5.11)
RDW: 12.4 % (ref 11.5–15.5)
WBC: 7.3 10*3/uL (ref 4.0–10.5)

## 2022-07-16 LAB — BASIC METABOLIC PANEL
BUN: 14 mg/dL (ref 6–23)
CO2: 26 mEq/L (ref 19–32)
Calcium: 9.5 mg/dL (ref 8.4–10.5)
Chloride: 83 mEq/L — ABNORMAL LOW (ref 96–112)
Creatinine, Ser: 1.08 mg/dL (ref 0.40–1.20)
GFR: 48.53 mL/min — ABNORMAL LOW (ref >60.00)
Glucose, Bld: 117 mg/dL — ABNORMAL HIGH (ref 70–99)
Potassium: 4.7 mEq/L (ref 3.5–5.1)
Sodium: 117 mEq/L — CL (ref 135–145)

## 2022-07-16 LAB — POC URINALSYSI DIPSTICK (AUTOMATED)
Bilirubin, UA: NEGATIVE
Blood, UA: NEGATIVE
Glucose, UA: NEGATIVE
Ketones, UA: NEGATIVE
Nitrite, UA: POSITIVE
Protein, UA: NEGATIVE
Spec Grav, UA: 1.01 (ref 1.010–1.025)
Urobilinogen, UA: 0.2 E.U./dL — AB
pH, UA: 8 (ref 5.0–8.0)

## 2022-07-16 NOTE — Progress Notes (Signed)
Subjective:    Patient ID: Catherine Tate, female    DOB: [birth year], 82 y.o.   MRN: [id number]  Urinary Frequency  Associated symptoms include frequency and nausea. Pertinent negatives include no vomiting.    Catherine Tate is a very pleasant 82 y.o. female with a history of recurrent cystitis, urinary incontinence, CKD, type 2 diabetes who presents today to discuss urinary frequency.  She was last evaluated on 07/01/22 for follow up of cystitis with ongoing dysuria despite antibiotic treatment in February and March 2024. During her visit on 07/01/22 she was treated for urinary incontinence with oxybutynin 5 mg BID as this was thought to be the cause for her recurrent UTI's.   06/10/22: Culture positive with E Coli, treated with Macrobid 100 mg BID x 5 days.   06/18/22: Culture positive with E Coli, treated with Keflex 500 mg BID x 7 days.  07/01/22: Culture positive with E coli, treated with Bactrim DS BID x 7 days.   Since her last visit she has completed her Bactrim DS course. She has been compliant to her oxybutynin 5 mg twice daily. She feels that she's soaking more pads and leaking more urine.   About one week ago she developed decreased appetite, nausea, increased fatigue, disturbed sleep, and generalized weakness. She is drinking water and ginger ale well. This morning she experienced 2 episodes of diarrhea after eating. No episodes since.    Review of Systems  Constitutional:  Positive for fatigue. Negative for fever.  Gastrointestinal:  Positive for diarrhea and nausea. Negative for abdominal pain and vomiting.  Genitourinary:  Positive for dysuria and frequency.       Urinary incontinence          Past Medical History:  Diagnosis Date   Arthritis    Cancer 03/13/2016   INVASIVE MAMMARY CARCINOMA   Cataract 02/03/2018   Cellulitis of left lower extremity 09/23/2021   Chest wall abscess 05/15/2017   Chickenpox    Complication of anesthesia    Frequent UTI     GERD (gastroesophageal reflux disease)    RARE   Hypertension    Personal history of chemotherapy 2017/2018   F/U left breast cancer   Personal history of radiation therapy 2018   F/U left breast cancer   PONV (postoperative nausea and vomiting)    RSD (reflex sympathetic dystrophy)    Seasonal allergies    Skin cancer    Urine incontinence     Social History   Socioeconomic History   Marital status: Single    Spouse name: Not on file   Number of children: Not on file   Years of education: Not on file   Highest education level: Not on file  Occupational History   Occupation: retired  Tobacco Use   Smoking status: Former    Packs/day: 1.00    Years: 20.00    Additional pack years: 0.00    Total pack years: 20.00    Types: Cigarettes    Quit date: 04/14/1994    Years since quitting: 28.2   Smokeless tobacco: Never  Vaping Use   Vaping Use: Never used  Substance and Sexual Activity   Alcohol use: No    Alcohol/week: 0.0 standard drinks of alcohol   Drug use: No   Sexual activity: Not Currently  Other Topics Concern   Not on file  Social History Narrative   Single.   2 children, 1 grandchild.   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  Enjoys making jewelry, puzzle books, sewing, traveling.   Son lives with her   Social Determinants of Health   Financial Resource Strain: Low Risk  (08/14/2021)   Overall Financial Resource Strain (CARDIA)    Difficulty of Paying Living Expenses: Not hard at all  Food Insecurity: No Food Insecurity (08/14/2021)   Hunger Vital Sign    Worried About Running Out of Food in the Last Year: Never true    [place] in the Last Year: Never true  Transportation Needs: No Transportation Needs (08/14/2021)   PRAPARE - Hydrologist (Medical): No    Lack of Transportation (Non-Medical): No  Physical Activity: Insufficiently Active (08/14/2021)   Exercise Vital Sign    Days of Exercise per Week: 4 days     Minutes of Exercise per Session: 30 min  Stress: No Stress Concern Present (08/14/2021)   [place]    Feeling of Stress : Not at all  Social Connections: Moderately Integrated (08/14/2021)   Social Connection and Isolation Panel [NHANES]    Frequency of Communication with Friends and Family: More than three times a week    Frequency of Social Gatherings with Friends and Family: More than three times a week    Attends Religious Services: More than 4 times per year    Active Member of Clubs or Organizations: Yes    Attends Archivist Meetings: More than 4 times per year    Marital Status: Never married  Intimate Partner Violence: Not At Risk (08/14/2021)   Humiliation, Afraid, Rape, and Kick questionnaire    Fear of Current or Ex-Partner: No    Emotionally Abused: No    Physically Abused: No    Sexually Abused: No    Past Surgical History:  Procedure Laterality Date   ABDOMINAL HYSTERECTOMY  2005   total/ Dr Davis Gourd   BLADDER TUMOR EXCISION  2006   BREAST BIOPSY Left 03/13/2016   INVASIVE MAMMARY CARCINOMA   BREAST LUMPECTOMY Left 2018   BREAST LUMPECTOMY WITH SENTINEL LYMPH NODE BIOPSY Left 07/14/2016   Procedure: BREAST LUMPECTOMY WITH SENTINEL LYMPH NODE BX;  Surgeon: [name], MD;  Location: [facility] OR[facility];  Service: General;  Laterality: Left;   COLONOSCOPY WITH PROPOFOL N/A 01/02/2021   Procedure: COLONOSCOPY WITH PROPOFOL;  Surgeon: [name], MD;  Location: [facility];  Service: Endoscopy;  Laterality: N/A;   denture surgery     OOPHORECTOMY     PORTACATH PLACEMENT N/A 04/01/2016   Procedure: INSERTION PORT-A-CATH;  Surgeon: [name], MD;  Location: [facility] OR[facility];  Service: General;  Laterality: N/A;   SKIN CANCER EXCISION Right 2015   foot/ Dr Nevada Crane   TUBAL LIGATION     TUMOR EXCISION Right    FOOT    Family History  Problem Relation Age of Onset   Heart disease Mother     Transient ischemic attack Mother    Emphysema Father    Breast cancer Sister    Breast cancer Cousin        paternal   Kidney cancer Neg Hx    Bladder Cancer Neg Hx     Allergies  Allergen Reactions   Bee Pollen Hives   Mosquito (Diagnostic) Hives   Other Other (See Comments)    Uncoded Allergy. Allergen: VICRYL  (90% glycolide and 10% L-lactide [polyglactin-- synthetic absorbable sterile surgical suture]), Other Reaction: Infection   Gabapentin Swelling   Mosquito (Culex  Pipiens) Allergy Skin Test Hives   Norvasc [Amlodipine Besylate] Swelling    TO FEET   Lisinopril Cough    Current Outpatient Medications on File Prior to Visit  Medication Sig Dispense Refill   acetaminophen (TYLENOL) 500 MG tablet Take 1,000 mg by mouth every 6 (six) hours as needed for moderate pain.      alendronate (FOSAMAX) 70 MG tablet Take 1 tablet (70 mg total) by mouth once a week. Take with a full glass of water on an empty stomach. 12 tablet 3   Ascorbic Acid (VITAMIN C) 1000 MG tablet Take 1,000 mg by mouth daily.     atorvastatin (LIPITOR) 10 MG tablet TAKE 1 TABLET BY MOUTH ONCE A DAY FOR CHOLSTEROL. 90 tablet 0   Calcium Carb-Cholecalciferol (CALCIUM/VITAMIN D PO) Take 1,200 mg by mouth.     cetirizine (ZYRTEC) 10 MG tablet Take 1 tablet (10 mg total) by mouth daily. 30 tablet 11   CRANBERRY PO Take 1 capsule by mouth 2 (two) times daily.     fluticasone (FLONASE) 50 MCG/ACT nasal spray Place 2 sprays into both nostrils daily. 16 g 6   hydrochlorothiazide (HYDRODIURIL) 25 MG tablet Take 1 tablet (25 mg total) by mouth daily. for blood pressure. 90 tablet 0   losartan (COZAAR) 50 MG tablet TAKE 1 TABLET BY MOUTH ONCE A DAY FOR BLOOD PRESSURE. 90 tablet 0   ondansetron (ZOFRAN-ODT) 4 MG disintegrating tablet Take 1 tablet (4 mg total) by mouth every 8 (eight) hours as needed for nausea or vomiting. 20 tablet 0   oxybutynin (DITROPAN) 5 MG tablet Take 1 tablet (5 mg total) by mouth 2 (two) times  daily. For urinary incontinence. 180 tablet 0   Probiotic Product (PROBIOTIC PO) Take 1 tablet by mouth daily.     No current facility-administered medications on file prior to visit.    BP 138/68   Pulse (!) 105   Temp 98.2 F (36.8 C) (Temporal)   Ht 5\' 5"  (1.651 m)   Wt 159 lb (72.1 kg)   SpO2 97%   BMI 26.46 kg/m  Objective:   Physical Exam Constitutional:      Appearance: She is not ill-appearing.  Cardiovascular:     Rate and Rhythm: Regular rhythm. Tachycardia present.  Pulmonary:     Effort: Pulmonary effort is normal.     Breath sounds: Normal breath sounds.  Abdominal:     General: Bowel sounds are normal.     Palpations: Abdomen is soft.     Tenderness: There is no abdominal tenderness.  Musculoskeletal:     Cervical back: Neck supple.  Skin:    General: Skin is warm and dry.           Assessment & Plan:  Recurrent UTI Assessment & Plan: Now with what appears to be a 4th UTI within in the last 6 weeks.   Increase oxybutynin to 10 mg BID. Referral placed to Urology.  CBC with diff and BMP pending.  Await culture results.   Orders: -     CBC with Differential/Platelet -     Basic metabolic panel -     Ambulatory referral to Urology  Urinary frequency -     POCT Urinalysis Dipstick (Automated) -     Urine Culture  Acute diarrhea Assessment & Plan: Concerning for c-diff, especially in the setting of recurrent UTI with multiple antibiotic use.  Will order c-diff panel.  Orders: -     C. difficile GDH and Toxin  A/B  Urinary incontinence, mixed Assessment & Plan: Not effective at this point.  Increase oxybutynin to 10 mg BID. Referral placed to Urology.          [name], NP

## 2022-07-16 NOTE — Assessment & Plan Note (Addendum)
Now with what appears to be a 4th UTI within in the last 6 weeks.   Increase oxybutynin to 10 mg BID. Referral placed to Urology.  CBC with diff and BMP pending.  Await culture results.

## 2022-07-16 NOTE — Patient Instructions (Addendum)
For bladder control: Take 2 oxybutynin pills twice daily.  Stop by the lab prior to leaving today. I will notify you of your results once received.   Return the stool sample if you continue to experience diarrhea.   It was a pleasure to see you today!

## 2022-07-16 NOTE — Telephone Encounter (Signed)
I was out of the office when this result was received. Please direct all critical labs to a provider that is in the office.

## 2022-07-16 NOTE — Telephone Encounter (Signed)
[place] lab called in with a critical lab:   Sodium: 117

## 2022-07-16 NOTE — Assessment & Plan Note (Signed)
Concerning for c-diff, especially in the setting of recurrent UTI with multiple antibiotic use.  Will order c-diff panel.

## 2022-07-16 NOTE — Addendum Note (Signed)
Addended by: Pilar Grammes on: 07/16/2022 01:22 PM   Modules accepted: Orders

## 2022-07-16 NOTE — Assessment & Plan Note (Signed)
Not effective at this point.  Increase oxybutynin to 10 mg BID. Referral placed to Urology.

## 2022-07-17 ENCOUNTER — Encounter: Payer: Self-pay | Admitting: Internal Medicine

## 2022-07-17 ENCOUNTER — Inpatient Hospital Stay: Payer: PPO

## 2022-07-17 ENCOUNTER — Inpatient Hospital Stay
Admission: EM | Admit: 2022-07-17 | Discharge: 2022-07-20 | DRG: 392 | Disposition: A | Discharge status: Home / Self Care | Payer: PPO | Attending: Internal Medicine | Admitting: Internal Medicine

## 2022-07-17 DIAGNOSIS — N1330 Unspecified hydronephrosis: Secondary | ICD-10-CM | POA: Diagnosis not present

## 2022-07-17 DIAGNOSIS — E860 Dehydration: Secondary | ICD-10-CM | POA: Diagnosis not present

## 2022-07-17 DIAGNOSIS — K529 Noninfective gastroenteritis and colitis, unspecified: Secondary | ICD-10-CM | POA: Diagnosis not present

## 2022-07-17 DIAGNOSIS — R1314 Dysphagia, pharyngoesophageal phase: Secondary | ICD-10-CM | POA: Diagnosis present

## 2022-07-17 DIAGNOSIS — Z91048 Other nonmedicinal substance allergy status: Secondary | ICD-10-CM

## 2022-07-17 DIAGNOSIS — K449 Diaphragmatic hernia without obstruction or gangrene: Secondary | ICD-10-CM | POA: Diagnosis present

## 2022-07-17 DIAGNOSIS — Z8249 Family history of ischemic heart disease and other diseases of the circulatory system: Secondary | ICD-10-CM | POA: Diagnosis not present

## 2022-07-17 DIAGNOSIS — Z87891 Personal history of nicotine dependence: Secondary | ICD-10-CM

## 2022-07-17 DIAGNOSIS — K2289 Other specified disease of esophagus: Secondary | ICD-10-CM | POA: Diagnosis present

## 2022-07-17 DIAGNOSIS — N390 Urinary tract infection, site not specified: Secondary | ICD-10-CM | POA: Diagnosis not present

## 2022-07-17 DIAGNOSIS — N3941 Urge incontinence: Secondary | ICD-10-CM | POA: Diagnosis present

## 2022-07-17 DIAGNOSIS — R32 Unspecified urinary incontinence: Secondary | ICD-10-CM

## 2022-07-17 DIAGNOSIS — R300 Dysuria: Secondary | ICD-10-CM | POA: Diagnosis not present

## 2022-07-17 DIAGNOSIS — R112 Nausea with vomiting, unspecified: Secondary | ICD-10-CM | POA: Insufficient documentation

## 2022-07-17 DIAGNOSIS — Z888 Allergy status to other drugs, medicaments and biological substances status: Secondary | ICD-10-CM

## 2022-07-17 DIAGNOSIS — I129 Hypertensive chronic kidney disease with stage 1 through stage 4 chronic kidney disease, or unspecified chronic kidney disease: Secondary | ICD-10-CM | POA: Diagnosis not present

## 2022-07-17 DIAGNOSIS — Z66 Do not resuscitate: Secondary | ICD-10-CM | POA: Diagnosis present

## 2022-07-17 DIAGNOSIS — R338 Other retention of urine: Secondary | ICD-10-CM | POA: Diagnosis present

## 2022-07-17 DIAGNOSIS — E871 Hypo-osmolality and hyponatremia: Secondary | ICD-10-CM | POA: Diagnosis not present

## 2022-07-17 DIAGNOSIS — E785 Hyperlipidemia, unspecified: Secondary | ICD-10-CM | POA: Diagnosis not present

## 2022-07-17 DIAGNOSIS — M810 Age-related osteoporosis without current pathological fracture: Secondary | ICD-10-CM | POA: Diagnosis not present

## 2022-07-17 DIAGNOSIS — R933 Abnormal findings on diagnostic imaging of other parts of digestive tract: Secondary | ICD-10-CM | POA: Diagnosis not present

## 2022-07-17 DIAGNOSIS — R197 Diarrhea, unspecified: Principal | ICD-10-CM | POA: Diagnosis present

## 2022-07-17 DIAGNOSIS — Z7983 Long term (current) use of bisphosphonates: Secondary | ICD-10-CM

## 2022-07-17 DIAGNOSIS — Z803 Family history of malignant neoplasm of breast: Secondary | ICD-10-CM

## 2022-07-17 DIAGNOSIS — E1122 Type 2 diabetes mellitus with diabetic chronic kidney disease: Secondary | ICD-10-CM | POA: Diagnosis not present

## 2022-07-17 DIAGNOSIS — E876 Hypokalemia: Secondary | ICD-10-CM | POA: Diagnosis present

## 2022-07-17 DIAGNOSIS — R531 Weakness: Secondary | ICD-10-CM | POA: Diagnosis not present

## 2022-07-17 DIAGNOSIS — Z853 Personal history of malignant neoplasm of breast: Secondary | ICD-10-CM

## 2022-07-17 DIAGNOSIS — I10 Essential (primary) hypertension: Secondary | ICD-10-CM | POA: Diagnosis present

## 2022-07-17 DIAGNOSIS — K219 Gastro-esophageal reflux disease without esophagitis: Secondary | ICD-10-CM | POA: Diagnosis present

## 2022-07-17 DIAGNOSIS — Z79899 Other long term (current) drug therapy: Secondary | ICD-10-CM

## 2022-07-17 DIAGNOSIS — K224 Dyskinesia of esophagus: Secondary | ICD-10-CM | POA: Diagnosis not present

## 2022-07-17 DIAGNOSIS — N183 Chronic kidney disease, stage 3 unspecified: Secondary | ICD-10-CM | POA: Diagnosis present

## 2022-07-17 DIAGNOSIS — N3592 Unspecified urethral stricture, female: Secondary | ICD-10-CM | POA: Diagnosis present

## 2022-07-17 DIAGNOSIS — N368 Other specified disorders of urethra: Secondary | ICD-10-CM | POA: Diagnosis not present

## 2022-07-17 DIAGNOSIS — R339 Retention of urine, unspecified: Secondary | ICD-10-CM | POA: Diagnosis not present

## 2022-07-17 DIAGNOSIS — Z87440 Personal history of urinary (tract) infections: Secondary | ICD-10-CM

## 2022-07-17 DIAGNOSIS — R630 Anorexia: Secondary | ICD-10-CM | POA: Diagnosis present

## 2022-07-17 DIAGNOSIS — Z923 Personal history of irradiation: Secondary | ICD-10-CM

## 2022-07-17 DIAGNOSIS — R1310 Dysphagia, unspecified: Secondary | ICD-10-CM | POA: Diagnosis not present

## 2022-07-17 DIAGNOSIS — Z9221 Personal history of antineoplastic chemotherapy: Secondary | ICD-10-CM | POA: Diagnosis not present

## 2022-07-17 DIAGNOSIS — Z85828 Personal history of other malignant neoplasm of skin: Secondary | ICD-10-CM

## 2022-07-17 DIAGNOSIS — Z823 Family history of stroke: Secondary | ICD-10-CM

## 2022-07-17 DIAGNOSIS — Z825 Family history of asthma and other chronic lower respiratory diseases: Secondary | ICD-10-CM

## 2022-07-17 LAB — COMPREHENSIVE METABOLIC PANEL
ALT: 34 U/L (ref 0–44)
AST: 37 U/L (ref 15–41)
Albumin: 4.2 g/dL (ref 3.5–5.0)
Alkaline Phosphatase: 54 U/L (ref 38–126)
Anion gap: 9 (ref 5–15)
BUN: 13 mg/dL (ref 8–23)
CO2: 25 mmol/L (ref 22–32)
Calcium: 9.4 mg/dL (ref 8.9–10.3)
Chloride: 84 mmol/L — ABNORMAL LOW (ref 98–111)
Creatinine, Ser: 0.96 mg/dL (ref 0.44–1.00)
GFR, Estimated: 60 mL/min — ABNORMAL LOW (ref >60)
Glucose, Bld: 164 mg/dL — ABNORMAL HIGH (ref 70–99)
Potassium: 4 mmol/L (ref 3.5–5.1)
Sodium: 118 mmol/L — CL (ref 135–145)
Total Bilirubin: 1.1 mg/dL (ref 0.3–1.2)
Total Protein: 7.6 g/dL (ref 6.5–8.1)

## 2022-07-17 LAB — URINALYSIS, ROUTINE W REFLEX MICROSCOPIC
Bilirubin Urine: NEGATIVE
Glucose, UA: NEGATIVE mg/dL
Hgb urine dipstick: NEGATIVE
Ketones, ur: NEGATIVE mg/dL
Leukocytes,Ua: NEGATIVE
Nitrite: NEGATIVE
Protein, ur: NEGATIVE mg/dL
Specific Gravity, Urine: 1.003 — ABNORMAL LOW (ref 1.005–1.030)
pH: 7 (ref 5.0–8.0)

## 2022-07-17 LAB — URINE CULTURE
MICRO NUMBER:: 14777616
SPECIMEN QUALITY:: ADEQUATE

## 2022-07-17 LAB — CBC
HCT: 33.8 % — ABNORMAL LOW (ref 36.0–46.0)
Hemoglobin: 12.3 g/dL (ref 12.0–15.0)
MCH: 31.3 pg (ref 26.0–34.0)
MCHC: 36.4 g/dL — ABNORMAL HIGH (ref 30.0–36.0)
MCV: 86 fL (ref 80.0–100.0)
Platelets: 244 10*3/uL (ref 150–400)
RBC: 3.93 MIL/uL (ref 3.87–5.11)
RDW: 11.3 % — ABNORMAL LOW (ref 11.5–15.5)
WBC: 7 10*3/uL (ref 4.0–10.5)
nRBC: 0 % (ref 0.0–0.2)

## 2022-07-17 LAB — OSMOLALITY, URINE: Osmolality, Ur: 214 mOsm/kg — ABNORMAL LOW (ref 300–900)

## 2022-07-17 LAB — LIPASE, BLOOD: Lipase: 35 U/L (ref 11–51)

## 2022-07-17 LAB — OSMOLALITY: Osmolality: 248 mOsm/kg — CL (ref 275–295)

## 2022-07-17 LAB — SODIUM
Sodium: 120 mmol/L — ABNORMAL LOW (ref 135–145)
Sodium: 118 mmol/L — CL (ref 135–145)

## 2022-07-17 LAB — SODIUM, URINE, RANDOM: Sodium, Ur: 74 mmol/L

## 2022-07-17 LAB — MAGNESIUM: Magnesium: 1.7 mg/dL (ref 1.7–2.4)

## 2022-07-17 MED ORDER — ACETAMINOPHEN 325 MG PO TABS: 650.0000 mg | ORAL_TABLET | Freq: Four times a day (QID) | ORAL | Status: DC | PRN

## 2022-07-17 MED ORDER — ACETAMINOPHEN 650 MG RE SUPP: 650.0000 mg | Freq: Four times a day (QID) | RECTAL | Status: DC | PRN

## 2022-07-17 MED ORDER — SENNOSIDES-DOCUSATE SODIUM 8.6-50 MG PO TABS: 1.0000 | ORAL_TABLET | Freq: Every evening | ORAL | Status: DC | PRN

## 2022-07-17 MED ORDER — VITAMIN C 500 MG PO TABS
1000.0000 mg | ORAL_TABLET | Freq: Every day | ORAL | Status: DC
  Administered 2022-07-18 – 2022-07-20 (×3): 1000 mg via ORAL
  Filled 2022-07-17 (×2): qty 2

## 2022-07-17 MED ORDER — ONDANSETRON HCL 4 MG/2ML IJ SOLN
4.0000 mg | Freq: Once | INTRAMUSCULAR | Status: AC
  Administered 2022-07-17: 4 mg via INTRAVENOUS
  Filled 2022-07-17: qty 2

## 2022-07-17 MED ORDER — MELATONIN 5 MG PO TABS
5.0000 mg | ORAL_TABLET | Freq: Every evening | ORAL | Status: DC | PRN
  Administered 2022-07-17 – 2022-07-20 (×3): 5 mg via ORAL
  Filled 2022-07-17 (×3): qty 1

## 2022-07-17 MED ORDER — SODIUM CHLORIDE 0.9 % IV SOLN
INTRAVENOUS | Status: DC
  Administered 2022-07-18: 100 mL/h via INTRAVENOUS

## 2022-07-17 MED ORDER — ENOXAPARIN SODIUM 40 MG/0.4ML IJ SOSY
40.0000 mg | PREFILLED_SYRINGE | INTRAMUSCULAR | Status: DC
  Administered 2022-07-17 – 2022-07-19 (×3): 40 mg via SUBCUTANEOUS
  Filled 2022-07-17 (×3): qty 0.4

## 2022-07-17 MED ORDER — ONDANSETRON HCL 4 MG PO TABS: 4.0000 mg | ORAL_TABLET | Freq: Four times a day (QID) | ORAL | Status: DC | PRN

## 2022-07-17 MED ORDER — ONDANSETRON HCL 4 MG/2ML IJ SOLN: 4.0000 mg | Freq: Four times a day (QID) | INTRAMUSCULAR | Status: DC | PRN

## 2022-07-17 MED ORDER — FLUTICASONE PROPIONATE 50 MCG/ACT NA SUSP: 2.0000 | Freq: Every day | NASAL | Status: DC | PRN

## 2022-07-17 MED ORDER — LOSARTAN POTASSIUM 50 MG PO TABS
50.0000 mg | ORAL_TABLET | Freq: Every day | ORAL | Status: DC
  Administered 2022-07-18 – 2022-07-20 (×3): 50 mg via ORAL
  Filled 2022-07-17 (×2): qty 1

## 2022-07-17 MED ORDER — ATORVASTATIN CALCIUM 10 MG PO TABS
10.0000 mg | ORAL_TABLET | Freq: Every day | ORAL | Status: DC
  Administered 2022-07-18 – 2022-07-20 (×3): 10 mg via ORAL
  Filled 2022-07-17 (×2): qty 1

## 2022-07-17 MED ORDER — HYDRALAZINE HCL 10 MG PO TABS: 10.0000 mg | ORAL_TABLET | Freq: Four times a day (QID) | ORAL | Status: DC | PRN

## 2022-07-17 MED ORDER — SODIUM CHLORIDE 0.9 % IV BOLUS
1000.0000 mL | Freq: Once | INTRAVENOUS | Status: AC
  Administered 2022-07-17: 1000 mL via INTRAVENOUS

## 2022-07-17 NOTE — ED Notes (Signed)
Outpatient blood work from yesterday, Na :  117.  Patient placed in ED 5 on Cardiac monitor.  AAOx3.  Skin warm and dry. NAD

## 2022-07-17 NOTE — Assessment & Plan Note (Signed)
Atorvastatin 10 mg daily resumed for 4/5

## 2022-07-17 NOTE — Assessment & Plan Note (Addendum)
Severe, appears to be asymptomatic at this time I suspect this is multifactorial including recent episode of diarrhea creating an osmotic effect and poor p.o. intake We will check urine osmolality, serum osmolality, urine sodium level and a portable chest x-ray for lesion creating SIADH (less likely) Status post sodium chloride 1 L bolus per EDP Will check serum sodium level at 1600 Goal serum sodium increase is 6-8 in the next 24 hours Admit to telemetry medical, inpatient

## 2022-07-17 NOTE — ED Notes (Signed)
Pt ambulated to toilet with assistance, pt brief changed and pt linen changed. Pt back In bed, pt comfortable and denies other needs at this time

## 2022-07-17 NOTE — ED Triage Notes (Signed)
N/V with diarrhea since yesterday. Pt sts that her pcp told her that sodium was low also.

## 2022-07-17 NOTE — Hospital Course (Signed)
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  Temperature was 98.7, respiration rate of 17, heart rate 100, blood pressure 179/93, SpO2 of 97% on room air.  Serum sodium is 118, potassium 4.0, chloride 84, bicarb 25, BUN 13, serum creatinine 0.96, EGFR 60, nonfasting blood glucose 164, WBC 7.0, hemoglobin 12.3, platelets of 244.  Magnesium level was 1.7.  ED treatment: Ondansetron 4 mg IV one-time dose, sodium chloride 1 L bolus.

## 2022-07-17 NOTE — Assessment & Plan Note (Signed)
Continue outpatient follow-up with medical oncology as appropriate

## 2022-07-17 NOTE — Assessment & Plan Note (Signed)
Home Fosamax not resumed on admission

## 2022-07-17 NOTE — ED Provider Notes (Addendum)
[facility] Provider Note    Event Date/Time   First MD Initiated Contact with Patient 07/17/22 1322     (approximate)   History   Abdominal Pain   HPI  Catherine Tate is a 82 y.o. female   Past medical history of HTN, urge continence, breast cancer status postchemotherapy and radiation therapy, frequent UTIs, hypertension, presents to the emergency department with hyponatremia 117.  Her outpatient doctor.  Recent history of frequent UTIs most recently treated last week with antibiotics continues to have urinary frequency and mild dysuria and urge incontinence, referral has been placed by primary doctor to urologist.  Wilburn Mylar she had several bouts of watery profuse diarrhea without bleeding without abdominal pain has been nauseous without vomiting.  Over the course of the last several weeks to months she has had frequent UTIs and is thus felt generalized weakness and poor p.o. intake, nauseous but no vomiting.  Mostly drinks water and ginger ale and does not eat much.  She denies chest pain, respiratory symptoms, and no longer has diarrhea today and denies abdominal pain.  The niece and caretaker at bedside who states that mentation is normal.  No reports of seizure.  Independent Historian contributed to assessment above: Her niece and caretaker who is at bedside to offer collateral information as above  External Medical Documents Reviewed: Family medicine note from yesterday 07/16/2022 addressing her recurrent UTIs with a plan for referral to urology      Physical Exam   Triage Vital Signs: ED Triage Vitals [07/17/22 1254]  Enc Vitals Group     BP (!) 179/93     Pulse Rate 100     Resp 17     Temp 98.7 F (37.1 C)     Temp Source Oral     SpO2 97 %     Weight 159 lb (72.1 kg)     Height      Head Circumference      Peak Flow      Pain Score 0     Pain Loc      Pain Edu?      Excl. in [place]?     Most recent vital signs: Vitals:   07/17/22  1254  BP: (!) 179/93  Pulse: 100  Resp: 17  Temp: 98.7 F (37.1 C)  SpO2: 97%    General: Awake, no distress.  CV:  Good peripheral perfusion.  Resp:  Normal effort.  Abd:  No distention.  Other:  Awake alert oriented mentation is normal per family member who is at bedside, moving all extremities, comfortable and nontoxic appearance.  Hypertensive afebrile.  Abdomen is soft and nontender skin appears warm well-perfused.   ED Results / Procedures / Treatments   Labs (all labs ordered are listed, but only abnormal results are displayed) Labs Reviewed  COMPREHENSIVE METABOLIC PANEL - Abnormal; Notable for the following components:      Result Value   Sodium 118 (*)    Chloride 84 (*)    Glucose, Bld 164 (*)    GFR, Estimated 60 (*)    All other components within normal limits  CBC - Abnormal; Notable for the following components:   HCT 33.8 (*)    MCHC 36.4 (*)    RDW 11.3 (*)    All other components within normal limits  GASTROINTESTINAL PANEL BY PCR, STOOL (REPLACES STOOL CULTURE)  C DIFFICILE QUICK SCREEN W PCR REFLEX    LIPASE, BLOOD  MAGNESIUM  URINALYSIS,  ROUTINE W REFLEX MICROSCOPIC  OSMOLALITY, URINE  SODIUM, URINE, RANDOM  OSMOLALITY  SODIUM     I ordered and reviewed the above labs they are notable for hyponatremia 118       PROCEDURES:  Critical Care performed: No  .Critical Care  Performed by: Lucillie Garfinkel, MD Authorized by: Lucillie Garfinkel, MD   Critical care provider statement:    Critical care time (minutes):  0    MEDICATIONS ORDERED IN ED: Medications  ascorbic acid (VITAMIN C) tablet 1,000 mg (has no administration in time range)  acetaminophen (TYLENOL) tablet 650 mg (has no administration in time range)    Or  acetaminophen (TYLENOL) suppository 650 mg (has no administration in time range)  ondansetron (ZOFRAN) tablet 4 mg (has no administration in time range)    Or  ondansetron (ZOFRAN) injection 4 mg (has no administration in time  range)  enoxaparin (LOVENOX) injection 40 mg (has no administration in time range)  senna-docusate (Senokot-S) tablet 1 tablet (has no administration in time range)  hydrALAZINE (APRESOLINE) tablet 10 mg (has no administration in time range)  melatonin tablet 5 mg (has no administration in time range)  atorvastatin (LIPITOR) tablet 10 mg (has no administration in time range)  losartan (COZAAR) tablet 50 mg (has no administration in time range)  fluticasone (FLONASE) 50 MCG/ACT nasal spray 2 spray (has no administration in time range)  sodium chloride 0.9 % bolus 1,000 mL (0 mLs Intravenous Stopped 07/17/22 1552)  ondansetron (ZOFRAN) injection 4 mg (4 mg Intravenous Given 07/17/22 1430)    External physician / consultants:  I spoke with hospitalist for admission and regarding care plan for this patient.   IMPRESSION / MDM / ASSESSMENT AND PLAN / ED COURSE  I reviewed the triage vital signs and the nursing notes.                                Patient's presentation is most consistent with acute presentation with potential threat to life or bodily function.  Differential diagnosis includes, but is not limited to, hyponatremia, metabolic derangements, dehydration, AKI, intra-abdominal infection, urinary tract infection  The patient is on the cardiac monitor to evaluate for evidence of arrhythmia and/or significant heart rate changes.  MDM: With profound hyponatremia but fortunately without severe features.  Most likely in the setting of poor p.o. intake as well as diarrheal illness that has now resolved.  Abdomen is soft and nontender so I considered but doubt intra-abdominal infection or surgical abdominal pathologies.  Given her likely dehydration I will start with a crystalloid bolus normal saline and admit her for careful correction of her hyponatremia.  Urinalysis is sent to assess for urinary tract infection in the setting of recurrent infections.        FINAL CLINICAL  IMPRESSION(S) / ED DIAGNOSES   Final diagnoses:  Diarrhea, unspecified type  Hyponatremia  Poor appetite  Urinary incontinence, unspecified type  Recurrent UTI     Rx / DC Orders   ED Discharge Orders     None        Note:  This document was prepared using Dragon voice recognition software and may include unintentional dictation errors.    Lucillie Garfinkel, MD 07/17/22 1620    Lucillie Garfinkel, MD 07/17/22 [phone]

## 2022-07-17 NOTE — Assessment & Plan Note (Signed)
Home hydrochlorothiazide 25 mg daily not resumed on admission Resumed home losartan 50 mg daily for 07/18/2022 Hydralazine 10 mg p.o. every 6 hours as needed for SBP greater than 170, 4 days ordered

## 2022-07-17 NOTE — Assessment & Plan Note (Signed)
With reported diarrhea Query gastroenteritis Symptomatic support with IV fluids and as needed medications

## 2022-07-17 NOTE — Telephone Encounter (Signed)
Noted  

## 2022-07-17 NOTE — ED Notes (Signed)
Hospitalist notified about pt critical sodium, see orders.

## 2022-07-17 NOTE — Assessment & Plan Note (Signed)
Per patient this has resolved that she has not had diarrhea today

## 2022-07-17 NOTE — H&P (Addendum)
Addendum:   Serum sodium resulted at 1642 showing the level was 120 after 1 L of sodium chloride bolus given in the ED prior to admission  # Severe hyponatremia, asymptomatic, improving, slowly - Sodium chloride IVF at 75 mL/h, 1 day ordered - Timed Serum Sodium Level Recheck 2000-hour on day of admission - Will discuss with cross coverage provider for monitoring - Continue admission to inpatient, PCU   History and Physical   Catherine Tate [id number] DOB: Jun 12, 1940 DOA: 07/17/2022  PCP: Catherine Koch, NP  Outpatient Specialists: Dr. Grayland Tate, medical oncology Patient coming from: Home  I have personally briefly reviewed patient's old medical records in [place].  Chief Concern: Nausea, vomiting, diarrhea, hyponatremia per PCP  HPI: 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  Temperature was 98.7, respiration rate of 17, heart rate 100, blood pressure 179/93, SpO2 of 97% on room air.  Serum sodium is 118, potassium 4.0, chloride 84, bicarb 25, BUN 13, serum creatinine 0.96, EGFR 60, nonfasting blood glucose 164, WBC 7.0, hemoglobin 12.3, platelets of 244.  Magnesium level was 1.7.  ED treatment: Ondansetron 4 mg IV one-time dose, sodium chloride 1 L bolus. ---------------------------------- At bedside, she is able to tell me her name, age, current calendar year, current locatin of hospital.  She endorses nausea. She denies vomiting. She endorses two episodes of diarrhea, watery, brown 07/17/22 and over the weekend on Saturday.   She endroses poor PO intake for over one weak.   She denies chest pain, shortness of breath, abdominal pain, swelling of her legs  Social history: Her son lives with her. She is retired and  formerly worked at [employer]. She denies tobacco, etoh, and recreational drug use.   ROS: Constitutional: no weight change, no fever ENT/Mouth: no sore throat, no rhinorrhea Eyes: no eye pain, no vision changes Cardiovascular: no chest pain, no dyspnea,  no edema, no palpitations Respiratory: no cough, no sputum, no wheezing Gastrointestinal: + nausea, no vomiting, + diarrhea, no constipation Genitourinary: no urinary incontinence, no dysuria, no hematuria Musculoskeletal: no arthralgias, no myalgias Skin: no skin lesions, no pruritus, Neuro: + weakness, no loss of consciousness, no syncope Psych: no anxiety, no depression, + decrease appetite Heme/Lymph: no bruising, no bleeding  ED Course: Discussed with emergency medicine provider, patient requiring hospitalization for chief concerns of hyponatremia.  Assessment/Plan  Principal Problem:   Hyponatremia Active Problems:   Essential hypertension   History of breast cancer   Hyperlipidemia   Osteoporosis   Acute diarrhea   Nausea and vomiting   Assessment and Plan:  * Hyponatremia Severe, appears to be asymptomatic at this time I suspect this is multifactorial including recent episode of diarrhea creating an osmotic effect and poor p.o. intake We will check urine osmolality, serum osmolality, urine sodium level and a portable chest x-ray for lesion creating SIADH (less likely) Status post sodium chloride 1 L bolus per EDP Will check serum sodium level at 1600 Goal serum sodium increase is 6-8 in the next 24 hours Admit to telemetry medical, inpatient  Nausea and vomiting With reported diarrhea Query gastroenteritis Symptomatic support with IV fluids and as needed medications  Acute diarrhea Per patient this has resolved that she has not had diarrhea today  Osteoporosis Home Fosamax not resumed on admission  Hyperlipidemia  Atorvastatin 10 mg daily resumed for 4/5  History of breast cancer Continue outpatient  follow-up with medical oncology as appropriate  Essential hypertension Home hydrochlorothiazide 25 mg daily not resumed on admission Resumed home losartan 50 mg daily for 07/18/2022 Hydralazine 10 mg p.o. every 6 hours as needed for SBP greater than 170, 4 days ordered  Chart reviewed.   DVT prophylaxis: Enoxaparin Code Status: DNR, per patient at bedside with Niece at bedside Diet: Regular diet today as patient has hyponatremia Family Communication: Catherine Tate, niece at bedside with patient permission. Disposition Plan: Pending clinical course Consults called: none at this time Admission status: PCU, inpatient  Past Medical History:  Diagnosis Date   Arthritis    Cancer 03/13/2016   INVASIVE MAMMARY CARCINOMA   Cataract 02/03/2018   Cellulitis of left lower extremity 09/23/2021   Chest wall abscess 05/15/2017   Chickenpox    Complication of anesthesia    Frequent UTI    GERD (gastroesophageal reflux disease)    RARE   Hypertension    Personal history of chemotherapy 2017/2018   F/U left breast cancer   Personal history of radiation therapy 2018   F/U left breast cancer   PONV (postoperative nausea and vomiting)    RSD (reflex sympathetic dystrophy)    Seasonal allergies    Skin cancer    Urine incontinence    Past Surgical History:  Procedure Laterality Date   ABDOMINAL HYSTERECTOMY  2005   total/ Dr Davis Gourd   BLADDER TUMOR EXCISION  2006   BREAST BIOPSY Left 03/13/2016   INVASIVE MAMMARY CARCINOMA   BREAST LUMPECTOMY Left 2018   BREAST LUMPECTOMY WITH SENTINEL LYMPH NODE BIOPSY Left 07/14/2016   Procedure: BREAST LUMPECTOMY WITH SENTINEL LYMPH NODE BX;  Surgeon: [name], MD;  Location: [facility];  Service: General;  Laterality: Left;   COLONOSCOPY WITH PROPOFOL N/A 01/02/2021   Procedure: COLONOSCOPY WITH PROPOFOL;  Surgeon: [name], MD;  Location: [facility];  Service: Endoscopy;  Laterality: N/A;   denture surgery     OOPHORECTOMY      PORTACATH PLACEMENT N/A 04/01/2016   Procedure: INSERTION PORT-A-CATH;  Surgeon: [name], MD;  Location: [facility];  Service: General;  Laterality: N/A;   SKIN CANCER EXCISION Right 2015   foot/ Dr Nevada Crane   TUBAL LIGATION     TUMOR EXCISION Right    FOOT   Social History:  reports that she quit smoking about 28 years ago. Her smoking use included cigarettes. She has a 20.00 pack-year smoking history. She has never used smokeless tobacco. She reports that she does not drink alcohol and does not use drugs.  Allergies  Allergen Reactions   Bee Pollen Hives   Mosquito (Diagnostic) Hives   Other Other (See Comments)    Uncoded Allergy. Allergen: VICRYL  (90% glycolide and 10% L-lactide [polyglactin-- synthetic absorbable sterile surgical suture]), Other Reaction: Infection   Gabapentin Swelling   Mosquito (Culex Pipiens) Allergy Skin Test Hives   Norvasc [Amlodipine Besylate] Swelling    TO FEET   Lisinopril Cough   Family History  Problem Relation Age of Onset   Heart disease Mother    Transient ischemic attack Mother    Emphysema Father    Breast cancer Sister    Breast cancer Cousin        paternal   Kidney cancer Neg Hx    Bladder Cancer Neg Hx    Family history: Family history reviewed and not pertinent.  Prior to  Admission medications   Medication Sig Start Date End Date Taking? Authorizing Provider  acetaminophen (TYLENOL) 500 MG tablet Take 1,000 mg by mouth every 6 (six) hours as needed for moderate pain.     [provider]  alendronate (FOSAMAX) 70 MG tablet Take 1 tablet (70 mg total) by mouth once a week. Take with a full glass of water on an empty stomach. 06/09/22   Lloyd Huger, MD  Ascorbic Acid (VITAMIN C) 1000 MG tablet Take 1,000 mg by mouth daily.    [provider]  atorvastatin (LIPITOR) 10 MG tablet TAKE 1 TABLET BY MOUTH ONCE A DAY FOR CHOLSTEROL. 04/18/22   Catherine Koch, NP  Calcium Carb-Cholecalciferol (CALCIUM/VITAMIN  D PO) Take 1,200 mg by mouth.    [provider]  cetirizine (ZYRTEC) 10 MG tablet Take 1 tablet (10 mg total) by mouth daily. 09/23/21   Eugenia Pancoast, FNP  CRANBERRY PO Take 1 capsule by mouth 2 (two) times daily.    [provider]  fluticasone (FLONASE) 50 MCG/ACT nasal spray Place 2 sprays into both nostrils daily. 09/23/21   Eugenia Pancoast, FNP  hydrochlorothiazide (HYDRODIURIL) 25 MG tablet Take 1 tablet (25 mg total) by mouth daily. for blood pressure. 04/22/22   Catherine Koch, NP  losartan (COZAAR) 50 MG tablet TAKE 1 TABLET BY MOUTH ONCE A DAY FOR BLOOD PRESSURE. 04/18/22   Catherine Koch, NP  ondansetron (ZOFRAN-ODT) 4 MG disintegrating tablet Take 1 tablet (4 mg total) by mouth every 8 (eight) hours as needed for nausea or vomiting. 07/10/22   Catherine Koch, NP  oxybutynin (DITROPAN) 5 MG tablet Take 1 tablet (5 mg total) by mouth 2 (two) times daily. For urinary incontinence. 07/01/22   Catherine Koch, NP  Probiotic Product (PROBIOTIC PO) Take 1 tablet by mouth daily.    [provider]   Physical Exam: Vitals:   07/17/22 1254  BP: (!) 179/93  Pulse: 100  Resp: 17  Temp: 98.7 F (37.1 C)  TempSrc: Oral  SpO2: 97%  Weight: 72.1 kg   Constitutional: appears age-appropriate, NAD, calm, comfortable Eyes: PERRL, lids and conjunctivae normal ENMT: Mucous membranes are moist. Posterior pharynx clear of any exudate or lesions. Age-appropriate dentition. Hearing appropriate Neck: normal, supple, no masses, no thyromegaly Respiratory: clear to auscultation bilaterally, no wheezing, no crackles. Normal respiratory effort. No accessory muscle use.  Cardiovascular: Regular rate and rhythm, no murmurs / rubs / gallops. No extremity edema. 2+ pedal pulses. No carotid bruits.  Abdomen: no tenderness, no masses palpated, no hepatosplenomegaly. Bowel sounds positive.  Musculoskeletal: no clubbing / cyanosis. No joint deformity upper and lower extremities.  Good ROM, no contractures, no atrophy. Normal muscle tone.  Skin: no rashes, lesions, ulcers. No induration Neurologic: Sensation intact. Strength 5/5 in all 4.  Psychiatric: Normal judgment and insight. Alert and oriented x 3. Normal mood.   EKG: Ordered pending completion  Chest x-ray on Admission: I personally reviewed and I agree with radiologist reading as below.  DG Chest Port 1 View  Result Date: 07/17/2022 CLINICAL DATA:  Weakness.  Hyponatremia. EXAM: PORTABLE CHEST 1 VIEW COMPARISON:  Radiographs 04/01/2016. FINDINGS: 1454 hours. The heart size and mediastinal contours are stable. The lungs appear clear. There is no pleural effusion or pneumothorax. No acute osseous findings are evident. Numerous telemetry leads overlie the chest. IMPRESSION: No evidence of active cardiopulmonary process. Electronically Signed   By: Richardean Sale M.D.   On: 07/17/2022 15:06  Labs on Admission: I have personally reviewed following labs CBC: Recent Labs  Lab 07/16/22 1322 07/17/22 1258  WBC 7.3 7.0  NEUTROABS 5.3  --   HGB 12.2 12.3  HCT 34.4* 33.8*  MCV 90.2 86.0  PLT 258.0 [id number]   Basic Metabolic Panel: Recent Labs  Lab 07/16/22 1322 07/17/22 1258  NA 117* 118*  K 4.7 4.0  CL 83* 84*  CO2 26 25  GLUCOSE 117* 164*  BUN 14 13  CREATININE 1.08 0.96  CALCIUM 9.5 9.4  MG  --  1.7   GFR: Estimated Creatinine Clearance: 46.5 mL/min (by C-G formula based on SCr of 0.96 mg/dL).  Liver Function Tests: Recent Labs  Lab 07/17/22 1258  AST 37  ALT 34  ALKPHOS 54  BILITOT 1.1  PROT 7.6  ALBUMIN 4.2   Recent Labs  Lab 07/17/22 1258  LIPASE 35   Urine analysis:    Component Value Date/Time   COLORURINE YELLOW 01/14/2016 1619   APPEARANCEUR Cloudy (A) 08/28/2016 0909   LABSPEC 1.010 01/14/2016 1619   PHURINE 6.0 01/14/2016 1619   GLUCOSEU Negative 08/28/2016 0909   HGBUR NEGATIVE 01/14/2016 1619   BILIRUBINUR neg 07/16/2022 1247   BILIRUBINUR Negative 08/28/2016 0909    KETONESUR NEGATIVE 01/14/2016 1619   PROTEINUR Negative 07/16/2022 1247   PROTEINUR Negative 08/28/2016 0909   PROTEINUR NEGATIVE 01/14/2016 1619   UROBILINOGEN 0.2 (A) 07/16/2022 1247   NITRITE pos 07/16/2022 1247   NITRITE Negative 08/28/2016 0909   NITRITE NEGATIVE 01/14/2016 1619   LEUKOCYTESUR Large (3+) (A) 07/16/2022 1247   LEUKOCYTESUR 2+ (A) 08/28/2016 0909   CRITICAL CARE Performed by: Dr. Tobie Poet  Total critical care time: 35 minutes  Critical care time was exclusive of separately billable procedures and treating other patients.  Critical care was necessary to treat or prevent imminent or life-threatening deterioration.  Critical care was time spent personally by me on the following activities: development of treatment plan with patient and/or surrogate as well as nursing, discussions with consultants, evaluation of patient's response to treatment, examination of patient, obtaining history from patient or surrogate, ordering and performing treatments and interventions, ordering and review of laboratory studies, ordering and review of radiographic studies, pulse oximetry and re-evaluation of patient's condition.  This document was prepared using Dragon Voice Recognition software and may include unintentional dictation errors.  Dr. Tobie Poet [facility]  If 7PM-7AM, please contact overnight-coverage provider If 7AM-7PM, please contact day coverage provider [email or web address]  07/17/2022, 4:23 PM

## 2022-07-18 ENCOUNTER — Inpatient Hospital Stay: Payer: PPO

## 2022-07-18 DIAGNOSIS — E871 Hypo-osmolality and hyponatremia: Secondary | ICD-10-CM | POA: Diagnosis not present

## 2022-07-18 DIAGNOSIS — R300 Dysuria: Secondary | ICD-10-CM

## 2022-07-18 DIAGNOSIS — R339 Retention of urine, unspecified: Secondary | ICD-10-CM

## 2022-07-18 DIAGNOSIS — N368 Other specified disorders of urethra: Secondary | ICD-10-CM | POA: Diagnosis not present

## 2022-07-18 DIAGNOSIS — N1330 Unspecified hydronephrosis: Secondary | ICD-10-CM

## 2022-07-18 LAB — BASIC METABOLIC PANEL
Anion gap: 7 (ref 5–15)
Anion gap: 7 (ref 5–15)
Anion gap: 9 (ref 5–15)
BUN: 11 mg/dL (ref 8–23)
BUN: 10 mg/dL (ref 8–23)
BUN: 12 mg/dL (ref 8–23)
CO2: 23 mmol/L (ref 22–32)
CO2: 23 mmol/L (ref 22–32)
CO2: 23 mmol/L (ref 22–32)
Calcium: 8.4 mg/dL — ABNORMAL LOW (ref 8.9–10.3)
Calcium: 8.1 mg/dL — ABNORMAL LOW (ref 8.9–10.3)
Calcium: 8.6 mg/dL — ABNORMAL LOW (ref 8.9–10.3)
Chloride: 91 mmol/L — ABNORMAL LOW (ref 98–111)
Chloride: 94 mmol/L — ABNORMAL LOW (ref 98–111)
Chloride: 90 mmol/L — ABNORMAL LOW (ref 98–111)
Creatinine, Ser: 0.83 mg/dL (ref 0.44–1.00)
Creatinine, Ser: 0.8 mg/dL (ref 0.44–1.00)
Creatinine, Ser: 0.79 mg/dL (ref 0.44–1.00)
GFR, Estimated: >60 mL/min (ref >60)
GFR, Estimated: >60 mL/min (ref >60)
GFR, Estimated: >60 mL/min (ref >60)
Glucose, Bld: 122 mg/dL — ABNORMAL HIGH (ref 70–99)
Glucose, Bld: 121 mg/dL — ABNORMAL HIGH (ref 70–99)
Glucose, Bld: 113 mg/dL — ABNORMAL HIGH (ref 70–99)
Potassium: 3.2 mmol/L — ABNORMAL LOW (ref 3.5–5.1)
Potassium: 4.1 mmol/L (ref 3.5–5.1)
Potassium: 4.3 mmol/L (ref 3.5–5.1)
Sodium: 121 mmol/L — ABNORMAL LOW (ref 135–145)
Sodium: 124 mmol/L — ABNORMAL LOW (ref 135–145)
Sodium: 122 mmol/L — ABNORMAL LOW (ref 135–145)

## 2022-07-18 LAB — CBC
HCT: 29.6 % — ABNORMAL LOW (ref 36.0–46.0)
Hemoglobin: 10.6 g/dL — ABNORMAL LOW (ref 12.0–15.0)
MCH: 31.4 pg (ref 26.0–34.0)
MCHC: 35.8 g/dL (ref 30.0–36.0)
MCV: 87.6 fL (ref 80.0–100.0)
Platelets: 202 10*3/uL (ref 150–400)
RBC: 3.38 MIL/uL — ABNORMAL LOW (ref 3.87–5.11)
RDW: 11.4 % — ABNORMAL LOW (ref 11.5–15.5)
WBC: 6.5 10*3/uL (ref 4.0–10.5)
nRBC: 0 % (ref 0.0–0.2)

## 2022-07-18 LAB — MAGNESIUM: Magnesium: 1.5 mg/dL — ABNORMAL LOW (ref 1.7–2.4)

## 2022-07-18 LAB — PHOSPHORUS: Phosphorus: 2.8 mg/dL (ref 2.5–4.6)

## 2022-07-18 MED ORDER — HYDRALAZINE HCL 50 MG PO TABS: 50.0000 mg | ORAL_TABLET | Freq: Four times a day (QID) | ORAL | Status: DC | PRN

## 2022-07-18 MED ORDER — POTASSIUM CHLORIDE CRYS ER 20 MEQ PO TBCR
40.0000 meq | EXTENDED_RELEASE_TABLET | Freq: Once | ORAL | Status: AC
  Administered 2022-07-18: 40 meq via ORAL
  Filled 2022-07-18: qty 2

## 2022-07-18 MED ORDER — HYDRALAZINE HCL 20 MG/ML IJ SOLN: 10.0000 mg | Freq: Four times a day (QID) | INTRAMUSCULAR | Status: DC | PRN

## 2022-07-18 MED ORDER — POTASSIUM CHLORIDE 10 MEQ/100ML IV SOLN
10.0000 meq | INTRAVENOUS | Status: AC
  Administered 2022-07-18 (×2): 10 meq via INTRAVENOUS
  Filled 2022-07-18 (×2): qty 100

## 2022-07-18 MED ORDER — MAGNESIUM SULFATE 2 GM/50ML IV SOLN
2.0000 g | Freq: Once | INTRAVENOUS | Status: AC
  Administered 2022-07-18: 2 g via INTRAVENOUS
  Filled 2022-07-18: qty 50

## 2022-07-18 MED ORDER — SODIUM CHLORIDE 0.9 % IV SOLN: INTRAVENOUS | Status: AC

## 2022-07-18 MED ORDER — SODIUM CHLORIDE 0.9 % IV SOLN: INTRAVENOUS | Status: DC

## 2022-07-18 NOTE — ED Notes (Signed)
GI at bedside

## 2022-07-18 NOTE — ED Notes (Signed)
Dr. Lonna Cobb to bedside.

## 2022-07-18 NOTE — ED Notes (Signed)
Dr. Lonna Cobb at bedside, inserted 82F coude catheter, with assistance of dilators. attached to drainage bag. This RN assisted.

## 2022-07-18 NOTE — ED Notes (Addendum)
Dr. Lonna Cobb at bedside, attempting to catheterize pt.

## 2022-07-18 NOTE — H&P (View-Only) (Signed)
GI Inpatient Consult Note  Reason for Consult: Esophageal dysphagia    Attending Requesting Consult: Dr. Gillis Santa, MD  History of Present Illness: Catherine Tate is a 82 y.o. female seen for evaluation of esophageal dysphagia at the request of hospitalist - Dr. Gillis Santa. She has a PMH of HTN, HLD, GERD, history of breast cancer s/p chemoradiation, season allergies, osteoporosis, and reflux sympathetic dystrophy. She presented to the [facility] ED yesterday for chief complaint of  nausea, vomiting, diarrhea, and hyponatremia. ED work-up revealed severe hyponatremia serum sodium 118. GI consulted this afternoon for concerns of esophageal dysphagia. Barium swallow performed this afternoon commented on severe esophageal dysmotility and barium tablet hung up at level of GEJ. Patient reports she has been struggling with esophageal dysphagia for approximately 1-year. She endorses difficulties with solid food esophageal dysphagia localized to upper sternum just under her suprasternal notch. She has noticed issues with breads, meats, rice, and large pills. She denies any regurgitation of food bolus. No issues swallowing liquids. She denies any acid suppression therapy. She denies any hx of GERD requiring medication. She denies any loss of appetite or unintentional weight loss. She has noticed associated globus sensation and dry cough. She has not had an endoscopy before. She did have surveillance colonoscopy performed by Dr. Adela Glimpse 12/2020 which commented on diverticulosis and two 10 mm tubular adenomas removed from transverse and sigmoid colon. She has not had any bowel movements over the past 24 hours.   Past Medical History:  Past Medical History:  Diagnosis Date   Arthritis    Cancer 03/13/2016   INVASIVE MAMMARY CARCINOMA   Cataract 02/03/2018   Cellulitis of left lower extremity 09/23/2021   Chest wall abscess 05/15/2017   Chickenpox    Complication of anesthesia    Frequent UTI    GERD  (gastroesophageal reflux disease)    RARE   Hypertension    Personal history of chemotherapy 2017/2018   F/U left breast cancer   Personal history of radiation therapy 2018   F/U left breast cancer   PONV (postoperative nausea and vomiting)    RSD (reflex sympathetic dystrophy)    Seasonal allergies    Skin cancer    Urine incontinence     Problem List: Patient Active Problem List   Diagnosis Date Noted   Hyponatremia 07/17/2022   Nausea and vomiting 07/17/2022   Acute diarrhea 07/16/2022   Acute pain of left knee 07/01/2022   Seasonal allergic rhinitis due to pollen 09/23/2021   Type 2 diabetes mellitus with hyperglycemia 02/16/2019   Tachycardia 02/16/2019   Chronic kidney disease (CKD), stage III (moderate) 02/10/2018   Osteoporosis 02/10/2018   Dysuria 10/19/2017   Encounter for annual general medical examination with abnormal findings in adult 02/09/2017   Hyperlipidemia 02/09/2017   Malignant neoplasm of upper-outer quadrant of left breast in female, estrogen receptor positive 07/21/2016   History of breast cancer 03/19/2016   Medicare annual wellness visit, subsequent 11/05/2015   Essential hypertension 05/10/2015   Recurrent UTI 05/10/2015   Urinary incontinence, mixed 05/10/2015    Past Surgical History: Past Surgical History:  Procedure Laterality Date   ABDOMINAL HYSTERECTOMY  2005   total/ Dr Barnabas Lister   BLADDER TUMOR EXCISION  2006   BREAST BIOPSY Left 03/13/2016   INVASIVE MAMMARY CARCINOMA   BREAST LUMPECTOMY Left 2018   BREAST LUMPECTOMY WITH SENTINEL LYMPH NODE BIOPSY Left 07/14/2016   Procedure: BREAST LUMPECTOMY WITH SENTINEL LYMPH NODE BX;  Surgeon: Earline Mayotte,  MD;  Location: [facility] OR[facility];  Service: General;  Laterality: Left;   COLONOSCOPY WITH PROPOFOL N/A 01/02/2021   Procedure: COLONOSCOPY WITH PROPOFOL;  Surgeon: Earline MayotteByrnett, Jeffrey W, MD;  Location: [facility];  Service: Endoscopy;  Laterality: N/A;   denture surgery     OOPHORECTOMY      PORTACATH PLACEMENT N/A 04/01/2016   Procedure: INSERTION PORT-A-CATH;  Surgeon: Earline MayotteJeffrey W Byrnett, MD;  Location: [facility] OR[facility];  Service: General;  Laterality: N/A;   SKIN CANCER EXCISION Right 2015   foot/ Dr Margo AyeHall   TUBAL LIGATION     TUMOR EXCISION Right    FOOT    Allergies: Allergies  Allergen Reactions   Bee Pollen Hives   Mosquito (Diagnostic) Hives   Other Other (See Comments)    Uncoded Allergy. Allergen: VICRYL  (90% glycolide and 10% L-lactide [polyglactin-- synthetic absorbable sterile surgical suture]), Other Reaction: Infection   Gabapentin Swelling   Mosquito (Culex Pipiens) Allergy Skin Test Hives   Norvasc [Amlodipine Besylate] Swelling    TO FEET   Lisinopril Cough    Home Medications: (Not in a hospital admission)  Home medication reconciliation was completed with the patient.   Scheduled Inpatient Medications:    vitamin C  1,000 mg Oral Daily   atorvastatin  10 mg Oral Daily   enoxaparin (LOVENOX) injection  40 mg Subcutaneous Q24H   losartan  50 mg Oral Daily    Continuous Inpatient Infusions:    sodium chloride 100 mL/hr at 07/18/22 0816   magnesium sulfate bolus IVPB      PRN Inpatient Medications:  acetaminophen **OR** acetaminophen, fluticasone, hydrALAZINE, hydrALAZINE, melatonin, ondansetron **OR** ondansetron (ZOFRAN) IV, senna-docusate  Family History: family history includes Breast cancer in her cousin and sister; Emphysema in her father; Heart disease in her mother; Transient ischemic attack in her mother.  The patient's family history is negative for inflammatory bowel disorders, GI malignancy, or solid organ transplantation.  Social History:   reports that she quit smoking about 28 years ago. Her smoking use included cigarettes. She has a 20.00 pack-year smoking history. She has never used smokeless tobacco. She reports that she does not drink alcohol and does not use drugs. The patient denies ETOH, tobacco, or drug use.   Review of  Systems: Constitutional: Weight is stable.  Eyes: No changes in vision. ENT: No oral lesions, sore throat.  GI: see HPI.  Heme/Lymph: No easy bruising.  CV: No chest pain.  GU: No hematuria.  Integumentary: No rashes.  Neuro: No headaches.  Psych: No depression/anxiety.  Endocrine: No heat/cold intolerance.  Allergic/Immunologic: No urticaria.  Resp: No cough, SOB.  Musculoskeletal: No joint swelling.    Physical Examination: BP 129/66   Pulse 80   Temp 97.8 F (36.6 C)   Resp (!) 21   Wt 72.1 kg   SpO2 99%   BMI 26.46 kg/m  Gen: NAD, alert and oriented x 4 HEENT: PEERLA, EOMI, Neck: supple, no JVD or thyromegaly Chest: CTA bilaterally, no wheezes, crackles, or other adventitious sounds CV: RRR, no m/g/c/r Abd: soft, NT, ND, +BS in all four quadrants; no HSM, guarding, ridigity, or rebound tenderness Ext: no edema, well perfused with 2+ pulses, Skin: no rash or lesions noted Lymph: no LAD  Data: Lab Results  Component Value Date   WBC 6.5 07/18/2022   HGB 10.6 (L) 07/18/2022   HCT 29.6 (L) 07/18/2022   MCV 87.6 07/18/2022   PLT 202 07/18/2022   Recent Labs  Lab 07/16/22 1322 07/17/22 1258 07/18/22  0520  HGB 12.2 12.3 10.6*   Lab Results  Component Value Date   NA 124 (L) 07/18/2022   K 4.1 07/18/2022   CL 94 (L) 07/18/2022   CO2 23 07/18/2022   BUN 10 07/18/2022   CREATININE 0.80 07/18/2022   Lab Results  Component Value Date   ALT 34 07/17/2022   AST 37 07/17/2022   ALKPHOS 54 07/17/2022   BILITOT 1.1 07/17/2022   No results for input(s): "APTT", "INR", "PTT" in the last 168 hours.  CSY 01/02/2021 (Byrnett) - diverticulosis in rectosigmoid, sigmoid, and ascending colon, one 10 mm TA removed from proximal transverse colon, one 10 mm TA removed from sigmoid colon  Assessment/Plan:  82 y/o Caucasian female with a PMH of HTN, HLD, GERD, history of breast cancer s/p chemoradiation, season allergies, and reflux sympathetic dystrophy presented to the  [facility] ED 4/4 for chief complaint of nausea, vomiting, diarrhea, and malaise found to have significant hyponatremia. GI consulted for further evaluation of esophageal dysphagia.   Esophageal dysphagia - BaS this afternoon commented on patulous esophagus with severe dysmotility and 13 mm barium tablet got stuck at GEJ which could represent distal esophageal stricture  Esophageal dysmotility - likely age-related changes of esophagus   Hyponatremia   Electrolyte derangement - hypokalemia, hypomagnesemia   Acute gastroenteritis  Left hydronephrosis and urinary retention  History of breast cancer   Recommendations:  - Continue supportive care per primary team with antiemetics, pain control prn, IV fluid hydration, and management of medical comorbidities - Correct hyponatremia per primary team - Reviewed barium esophagogram results with patient and discussed recommendation to proceed with EGD +/- dilatation +/- esophageal biopsies for definitive evaluation - EGD tomorrow AM with Dr. Norma Fredrickson - NPO after midnight. Continue diet per primary team for now.  - Strict anti-reflux precautions reviewed  - See procedure note for findings and further recommendations  I reviewed the risks (including bleeding, perforation, infection, anesthesia complications, cardiac/respiratory complications), benefits and alternatives of EGD. Patient consents to proceed.    Thank you for the consult. Please call with questions or concerns.  Gilda Crease, PA-C [facility] [phone]

## 2022-07-18 NOTE — ED Notes (Signed)
Transport requested

## 2022-07-18 NOTE — Progress Notes (Signed)
       CROSS COVER NOTE  NAME: Catherine Tate MRN: [id number] DOB : 1940/07/18    HPI/Events of Note   Report:hyponatremia  On review of chart:    Assessment and  Interventions   Assessment: NS increased during night to 100 cc/h sodium now up to 121. K 3.2 Plan: Continue NS 100 repeat sodium level 0900 Potassium 40 oral and 2 runs 10 meQ IV       Donnie Mesa NP [facility]

## 2022-07-18 NOTE — ED Notes (Signed)
Pt. Returned to room.

## 2022-07-18 NOTE — ED Notes (Signed)
This RN paged hospitalist due to critical sodium. No new orders.

## 2022-07-18 NOTE — ED Notes (Signed)
This RN paged hospitalist to notify critical serum osmolality. No new orders.

## 2022-07-18 NOTE — Consult Note (Signed)
Urology Consult  Requesting physician: Gillis Santa, MD  Reason for consultation: Left hydronephrosis  Chief Complaint: Urinary incontinence  History of Present Illness: Catherine Tate is a 82 y.o. female admitted for hyponatremia, nausea and vomiting.  As recently seen PCP for chronic dysuria and urinary incontinence.  3 positive urine cultures for E. coli late February 2024, early March 2024 and mid March 2024 Chronic urinary incontinence requiring pads some urge incontinence but also unaware incontinence Renal ultrasound was obtained due to her urinary symptoms and she was noted to have severe left hydronephrosis.  No recent upper tract imaging.  A CT in 2007 showed no hydronephrosis Foley catheter was ordered however nursing staff unable to place a 14 or 16 French catheter  Past Medical History:  Diagnosis Date   Arthritis    Cancer 03/13/2016   INVASIVE MAMMARY CARCINOMA   Cataract 02/03/2018   Cellulitis of left lower extremity 09/23/2021   Chest wall abscess 05/15/2017   Chickenpox    Complication of anesthesia    Frequent UTI    GERD (gastroesophageal reflux disease)    RARE   Hypertension    Personal history of chemotherapy 2017/2018   F/U left breast cancer   Personal history of radiation therapy 2018   F/U left breast cancer   PONV (postoperative nausea and vomiting)    RSD (reflex sympathetic dystrophy)    Seasonal allergies    Skin cancer    Urine incontinence     Past Surgical History:  Procedure Laterality Date   ABDOMINAL HYSTERECTOMY  2005   total/ Dr Barnabas Lister   BLADDER TUMOR EXCISION  2006   BREAST BIOPSY Left 03/13/2016   INVASIVE MAMMARY CARCINOMA   BREAST LUMPECTOMY Left 2018   BREAST LUMPECTOMY WITH SENTINEL LYMPH NODE BIOPSY Left 07/14/2016   Procedure: BREAST LUMPECTOMY WITH SENTINEL LYMPH NODE BX;  Surgeon: Earline Mayotte, MD;  Location: [facility];  Service: General;  Laterality: Left;   COLONOSCOPY WITH PROPOFOL N/A 01/02/2021    Procedure: COLONOSCOPY WITH PROPOFOL;  Surgeon: Earline Mayotte, MD;  Location: [facility];  Service: Endoscopy;  Laterality: N/A;   denture surgery     OOPHORECTOMY     PORTACATH PLACEMENT N/A 04/01/2016   Procedure: INSERTION PORT-A-CATH;  Surgeon: Earline Mayotte, MD;  Location: [facility];  Service: General;  Laterality: N/A;   SKIN CANCER EXCISION Right 2015   foot/ Dr Margo Aye   TUBAL LIGATION     TUMOR EXCISION Right    FOOT    Home Medications:  Current Meds  Medication Sig   acetaminophen (TYLENOL) 500 MG tablet Take 1,000 mg by mouth every 6 (six) hours as needed for moderate pain.    alendronate (FOSAMAX) 70 MG tablet Take 1 tablet (70 mg total) by mouth once a week. Take with a full glass of water on an empty stomach.   Ascorbic Acid (VITAMIN C) 1000 MG tablet Take 1,000 mg by mouth daily.   atorvastatin (LIPITOR) 10 MG tablet TAKE 1 TABLET BY MOUTH ONCE A DAY FOR CHOLSTEROL.   Calcium Carb-Cholecalciferol (CALCIUM/VITAMIN D PO) Take 1,200 mg by mouth.   CRANBERRY PO Take 1 capsule by mouth 2 (two) times daily.   fluticasone (FLONASE) 50 MCG/ACT nasal spray Place 2 sprays into both nostrils daily.   hydrochlorothiazide (HYDRODIURIL) 25 MG tablet Take 1 tablet (25 mg total) by mouth daily. for blood pressure.   losartan (COZAAR) 50 MG tablet TAKE 1 TABLET BY MOUTH ONCE A DAY FOR BLOOD PRESSURE.  Probiotic Product (PROBIOTIC PO) Take 1 tablet by mouth daily.    Allergies:  Allergies  Allergen Reactions   Bee Pollen Hives   Mosquito (Diagnostic) Hives   Other Other (See Comments)    Uncoded Allergy. Allergen: VICRYL  (90% glycolide and 10% L-lactide [polyglactin-- synthetic absorbable sterile surgical suture]), Other Reaction: Infection   Gabapentin Swelling   Mosquito (Culex Pipiens) Allergy Skin Test Hives   Norvasc [Amlodipine Besylate] Swelling    TO FEET   Lisinopril Cough    Family History  Problem Relation Age of Onset   Heart disease Mother     Transient ischemic attack Mother    Emphysema Father    Breast cancer Sister    Breast cancer Cousin        paternal   Kidney cancer Neg Hx    Bladder Cancer Neg Hx     Social History:  reports that she quit smoking about 28 years ago. Her smoking use included cigarettes. She has a 20.00 pack-year smoking history. She has never used smokeless tobacco. She reports that she does not drink alcohol and does not use drugs.  ROS: A complete review of systems was performed.  All systems are negative except for pertinent findings as noted.  Physical Exam:  Vital signs in last 24 hours: Temp:  [97.6 F (36.4 C)-98 F (36.7 C)] 98 F (36.7 C) (04/05 1430) Pulse Rate:  [79-103] 99 (04/05 1600) Resp:  [12-32] 19 (04/05 1600) BP: (106-177)/(61-94) 148/61 (04/05 1430) SpO2:  [96 %-100 %] 99 % (04/05 1600) Constitutional:  Alert, No acute distress HEENT: [place] AT, moist mucus membranes.  Cardiovascular: Regular rate and rhythm Respiratory: Normal respiratory effort, lungs clear bilaterally GI: Abdomen is soft, nontender, nondistended, no abdominal masses GU: Atrophic vaginal mucosa.  Urethra meatus easily visualized and normal in appearance.  A 14 French coud Coloplast catheter met significant resistance and unable to place in the bladder Neurologic: Grossly intact, no focal deficits, moving all 4 extremities Psychiatric: Normal mood and affect   Laboratory Data:  Recent Labs    07/16/22 1322 07/17/22 1258 07/18/22 0520  WBC 7.3 7.0 6.5  HGB 12.2 12.3 10.6*  HCT 34.4* 33.8* 29.6*   Recent Labs    07/17/22 1258 07/17/22 1555 07/18/22 0009 07/18/22 0520 07/18/22 1238  NA 118*   < > 119* 121* 124*  K 4.0  --   --  3.2* 4.1  CL 84*  --   --  91* 94*  CO2 25  --   --  23 23  GLUCOSE 164*  --   --  122* 121*  BUN 13  --   --  11 10  CREATININE 0.96  --   --  0.83 0.80  CALCIUM 9.4  --   --  8.4* 8.1*   < > = values in this interval not displayed.   No results for input(s):  "LABPT", "INR" in the last 72 hours. No results for input(s): "LABURIN" in the last 72 hours. Results for orders placed or performed in visit on 07/16/22  Urine Culture     Status: None   Collection Time: 07/16/22 12:56 PM   Specimen: Urine  Result Value Ref Range Status   MICRO NUMBER: [id number]  Final   SPECIMEN QUALITY: Adequate  Final   Sample Source URINE  Final   STATUS: FINAL  Final   Result:   Final    Mixed genital flora isolated. These superficial bacteria are not indicative of a urinary  tract infection. No further organism identification is warranted on this specimen. If clinically indicated, recollect clean-catch, mid-stream urine and transfer  immediately to Urine Culture Transport Tube.      Radiologic Imaging: Renal ultrasound images were personally reviewed and interpreted.  Left hydronephrosis present.  The bladder was distended and night calculated the bladder volume at 710 mL  DG ESOPHAGUS W SINGLE CM (SOL OR THIN BA)  Result Date: 07/18/2022 CLINICAL DATA:  Patient referred to ED by PCP for hyponatremia. Patient complains of poor p.o. intake with nausea and no vomiting. Patient was referred for fluoroscopic esophagram study to rule out esophageal stricture. EXAM: ESOPHAGUS/BARIUM SWALLOW/TABLET STUDY TECHNIQUE: Single contrast examination was performed using thin liquid barium. This exam was performed by Alex Gardener, NP, and was supervised and interpreted by Lesia Hausen, MD. FLUOROSCOPY: Radiation Exposure Index (as provided by the fluoroscopic device): 55.50 mGy Kerma COMPARISON:  None Available. FINDINGS: Limited exam due to patient's inability to stand or position on fluoroscopy table. Swallowing: Appears normal. No vestibular penetration or aspiration seen. Pharynx: Unremarkable. Esophagus: The esophagus is patulous without suspicious mucosal lesion. Esophageal motility: Severe esophageal dysmotility with tertiary contractions. Hiatal Hernia: None. Gastroesophageal  reflux: No gastroesophageal reflux with provocation maneuvers. Ingested 13mm barium tablet: 13 mm barium tablet stuck in narrowing at GE junction and did not pass with multiple swallows of water and barium. Other: None. IMPRESSION: 1. Limited exam due to patient's inability to stand and position on fluoroscopy table. 2. Patulous esophagus with severe dysmotility. 3. The 13 mm tablet got stuck at the GE junction which could reflect a stricture. Correlate with endoscopy as indicated. Electronically Signed   By: Lesia Hausen M.D.   On: 07/18/2022 15:37   US RENAL  Result Date: 07/18/2022 CLINICAL DATA:  Dysuria EXAM: RENAL / URINARY TRACT ULTRASOUND COMPLETE COMPARISON:  CT 04/01/2006 FINDINGS: Right Kidney: Renal measurements: 9.7 x 5.1 x 5.9 cm = volume: 154 mL. Echogenicity within normal limits. No mass or hydronephrosis visualized. Left Kidney: Renal measurements: 8.4 x 4.6 x 3.3 cm = volume: 68 mL. Echogenicity within normal limits. Severe left hydronephrosis. No mass or shadowing stone visualized. Bladder: Appears normal for degree of bladder distention. Bilateral ureteral jets were seen. Other: None. IMPRESSION: Severe left hydronephrosis. Electronically Signed   By: Duanne Guess D.O.   On: 07/18/2022 11:20   DG Chest Port 1 View  Result Date: 07/17/2022 CLINICAL DATA:  Weakness.  Hyponatremia. EXAM: PORTABLE CHEST 1 VIEW COMPARISON:  Radiographs 04/01/2016. FINDINGS: 1454 hours. The heart size and mediastinal contours are stable. The lungs appear clear. There is no pleural effusion or pneumothorax. No acute osseous findings are evident. Numerous telemetry leads overlie the chest. IMPRESSION: No evidence of active cardiopulmonary process. Electronically Signed   By: Carey Bullocks M.D.   On: 07/17/2022 15:06    Impression/Assessment:   1.  Stenotic urethra Urethral dilation performed with catheter placement See procedure note  2.  Urinary retention Most likely secondary to above  3.  Left  hydronephrosis Most likely secondary to urinary retention  4.  Chronic dysuria  Recommendation:  Indwelling Foley x 7 days Repeat renal ultrasound 48 hours CT abdomen/pelvis for persistent hydronephrosis Will schedule follow-up office visit/voiding trial   07/18/2022, 5:01 PM  Irineo Axon,  MD

## 2022-07-18 NOTE — Progress Notes (Signed)
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  ED workup serum sodium 118, glucose 164 CXR negative  [facility] hospitalist consulted for admission and further management as below.  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 Monitor sodium level   # Hypokalemia, potassium repleted. # Hypomagnesemia, mag repleted. Monitor electrolytes and replete as needed.  # Acute gastroenteritis, unknown cause Presented with nausea vomiting and diarrhea Continue gentle hydration IV Follow C. difficile and GI pathogen Monitor electrolytes   # Possible esophageal dysphagia Follow barium swallow Follow GI for possible EGD   # Left hydronephrosis and urinary retention US renal: Severe left hydronephrosis.  Foley catheter inserted on 4/5 UA not very impressive for UTI Patient has incontinence and dysuria and history of frequent UTIs WBC count within normal range, monitor off antibiotics for now  HTN, HLD Continue Lipitor and losartan home dose Held hydrochlorothiazide secondary to hyponatremia Monitor BP and titrate medications accordingly  History of breast cancer Continue outpatient follow-up with medical oncology as appropriate  Body mass index is 26.46 kg/m.   Interventions:    Diet: Regular diet DVT Prophylaxis: Subcutaneous Lovenox   Advance goals of care discussion: DNR  Family Communication: family was not present at bedside, at the time of interview.  The pt provided permission to discuss medical plan with the family. Opportunity was given to ask question and all questions were answered satisfactorily.   Disposition:  Pt is from Home, admitted with severe hyponatremia, N/V/D, esophageal dysphagia, left hydronephrosis, still has low sodium, consulted GI and urologist, which precludes a safe discharge. Discharge to home, when clinically stable, may need few days to improve..  Subjective: No significant events overnight, patient had diarrhea yesterday morning, no diarrhea episodes since then but she is being vomiting persistently, no nausea.  Denies any abdominal pain.  Patient is complaining of urinary incontinence and dysuria.  Also complaining of food sticking in the mid and lower esophagus and causing vomiting.  Patient is requesting for possibility of stretching esophagus and consulting GI.   Physical Exam: General: NAD, lying comfortably Appear in no distress, affect appropriate Eyes: PERRLA ENT: Oral Mucosa Clear, moist  Neck: no JVD,  Cardiovascular: S1 and S2 Present, no Murmur,  Respiratory: good respiratory effort, Bilateral Air entry equal and Decreased, no Crackles, no wheezes Abdomen: Bowel Sound present, Soft and no tenderness,  Skin: no rashes Extremities: no Pedal edema, no calf tenderness Neurologic: without any new focal findings Gait not checked due to patient safety concerns  Vitals:   07/18/22 0330 07/18/22 0707 07/18/22 0800 07/18/22 0815  BP: 106/65 132/64 129/66   Pulse: 86 86 82 80  Resp: 12 17 18  (!) 21  Temp:  97.8 F (36.6 C)    TempSrc:      SpO2: 96% 99% 99% 99%  Weight:  Intake/Output Summary (Last 24 hours) at 07/18/2022 1409 Last data filed at 07/18/2022 0816 Gross per 24 hour  Intake  2344.65 ml  Output --  Net 2344.65 ml   Filed Weights   07/17/22 1254  Weight: 72.1 kg    Data Reviewed: I have personally reviewed and interpreted daily labs, tele strips, imagings as discussed above. I reviewed all nursing notes, pharmacy notes, vitals, pertinent old records I have discussed plan of care as described above with RN and patient/family.  CBC: Recent Labs  Lab 07/16/22 1322 07/17/22 1258 07/18/22 0520  WBC 7.3 7.0 6.5  NEUTROABS 5.3  --   --   HGB 12.2 12.3 10.6*  HCT 34.4* 33.8* 29.6*  MCV 90.2 86.0 87.6  PLT 258.0 244 202   Basic Metabolic Panel: Recent Labs  Lab 07/16/22 1322 07/17/22 1258 07/17/22 1555 07/17/22 2037 07/18/22 0009 07/18/22 0520 07/18/22 1238  NA 117* 118* 120* 118* 119* 121* 124*  K 4.7 4.0  --   --   --  3.2* 4.1  CL 83* 84*  --   --   --  91* 94*  CO2 26 25  --   --   --  23 23  GLUCOSE 117* 164*  --   --   --  122* 121*  BUN 14 13  --   --   --  11 10  CREATININE 1.08 0.96  --   --   --  0.83 0.80  CALCIUM 9.5 9.4  --   --   --  8.4* 8.1*  MG  --  1.7  --   --   --  1.5*  --   PHOS  --   --   --   --   --  2.8  --     Studies: US RENAL  Result Date: 07/18/2022 CLINICAL DATA:  Dysuria EXAM: RENAL / URINARY TRACT ULTRASOUND COMPLETE COMPARISON:  CT 04/01/2006 FINDINGS: Right Kidney: Renal measurements: 9.7 x 5.1 x 5.9 cm = volume: 154 mL. Echogenicity within normal limits. No mass or hydronephrosis visualized. Left Kidney: Renal measurements: 8.4 x 4.6 x 3.3 cm = volume: 68 mL. Echogenicity within normal limits. Severe left hydronephrosis. No mass or shadowing stone visualized. Bladder: Appears normal for degree of bladder distention. Bilateral ureteral jets were seen. Other: None. IMPRESSION: Severe left hydronephrosis. Electronically Signed   By: Duanne Guess D.O.   On: 07/18/2022 11:20   DG Chest Port 1 View  Result Date: 07/17/2022 CLINICAL DATA:  Weakness.  Hyponatremia. EXAM: PORTABLE CHEST 1 VIEW COMPARISON:   Radiographs 04/01/2016. FINDINGS: 1454 hours. The heart size and mediastinal contours are stable. The lungs appear clear. There is no pleural effusion or pneumothorax. No acute osseous findings are evident. Numerous telemetry leads overlie the chest. IMPRESSION: No evidence of active cardiopulmonary process. Electronically Signed   By: Carey Bullocks M.D.   On: 07/17/2022 15:06    Scheduled Meds:  vitamin C  1,000 mg Oral Daily   atorvastatin  10 mg Oral Daily   enoxaparin (LOVENOX) injection  40 mg Subcutaneous Q24H   losartan  50 mg Oral Daily   Continuous Infusions:  sodium chloride 100 mL/hr at 07/18/22 0816   PRN Meds: acetaminophen **OR** acetaminophen, fluticasone, hydrALAZINE, hydrALAZINE, melatonin, ondansetron **OR** ondansetron (ZOFRAN) IV, senna-docusate  Time spent: 55 minutes  Author: Gillis Santa. MD [facility] 07/18/2022 2:09 PM  To reach On-call, see care teams to locate the attending and reach out to them via [email or web address].  If 7PM-7AM, please contact night-coverage If you still have difficulty reaching the attending provider, please page the [facility] (Director on Call) for [facility] on amion for assistance.

## 2022-07-18 NOTE — Consult Note (Signed)
  GI Inpatient Consult Note  Reason for Consult: Esophageal dysphagia    Attending Requesting Consult: Dr. Dileep Kumar, MD  History of Present Illness: Catherine Tate is a 81 y.o. female seen for evaluation of esophageal dysphagia at the request of hospitalist - Dr. Dileep Kumar. She has a PMH of HTN, HLD, GERD, history of breast cancer s/p chemoradiation, season allergies, osteoporosis, and reflux sympathetic dystrophy. She presented to the [facility] ED yesterday for chief complaint of  nausea, vomiting, diarrhea, and hyponatremia. ED work-up revealed severe hyponatremia serum sodium 118. GI consulted this afternoon for concerns of esophageal dysphagia. Barium swallow performed this afternoon commented on severe esophageal dysmotility and barium tablet hung up at level of GEJ. Patient reports she has been struggling with esophageal dysphagia for approximately 1-year. She endorses difficulties with solid food esophageal dysphagia localized to upper sternum just under her suprasternal notch. She has noticed issues with breads, meats, rice, and large pills. She denies any regurgitation of food bolus. No issues swallowing liquids. She denies any acid suppression therapy. She denies any hx of GERD requiring medication. She denies any loss of appetite or unintentional weight loss. She has noticed associated globus sensation and dry cough. She has not had an endoscopy before. She did have surveillance colonoscopy performed by Dr. Jeff Byrnett 12/2020 which commented on diverticulosis and two 10 mm tubular adenomas removed from transverse and sigmoid colon. She has not had any bowel movements over the past 24 hours.   Past Medical History:  Past Medical History:  Diagnosis Date   Arthritis    Cancer 03/13/2016   INVASIVE MAMMARY CARCINOMA   Cataract 02/03/2018   Cellulitis of left lower extremity 09/23/2021   Chest wall abscess 05/15/2017   Chickenpox    Complication of anesthesia    Frequent UTI    GERD  (gastroesophageal reflux disease)    RARE   Hypertension    Personal history of chemotherapy 2017/2018   F/U left breast cancer   Personal history of radiation therapy 2018   F/U left breast cancer   PONV (postoperative nausea and vomiting)    RSD (reflex sympathetic dystrophy)    Seasonal allergies    Skin cancer    Urine incontinence     Problem List: Patient Active Problem List   Diagnosis Date Noted   Hyponatremia 07/17/2022   Nausea and vomiting 07/17/2022   Acute diarrhea 07/16/2022   Acute pain of left knee 07/01/2022   Seasonal allergic rhinitis due to pollen 09/23/2021   Type 2 diabetes mellitus with hyperglycemia 02/16/2019   Tachycardia 02/16/2019   Chronic kidney disease (CKD), stage III (moderate) 02/10/2018   Osteoporosis 02/10/2018   Dysuria 10/19/2017   Encounter for annual general medical examination with abnormal findings in adult 02/09/2017   Hyperlipidemia 02/09/2017   Malignant neoplasm of upper-outer quadrant of left breast in female, estrogen receptor positive 07/21/2016   History of breast cancer 03/19/2016   Medicare annual wellness visit, subsequent 11/05/2015   Essential hypertension 05/10/2015   Recurrent UTI 05/10/2015   Urinary incontinence, mixed 05/10/2015    Past Surgical History: Past Surgical History:  Procedure Laterality Date   ABDOMINAL HYSTERECTOMY  2005   total/ Dr [place]   BLADDER TUMOR EXCISION  2006   BREAST BIOPSY Left 03/13/2016   INVASIVE MAMMARY CARCINOMA   BREAST LUMPECTOMY Left 2018   BREAST LUMPECTOMY WITH SENTINEL LYMPH NODE BIOPSY Left 07/14/2016   Procedure: BREAST LUMPECTOMY WITH SENTINEL LYMPH NODE BX;  Surgeon: Jeffrey W Byrnett,   MD;  Location: [facility] ORS;  Service: General;  Laterality: Left;   COLONOSCOPY WITH PROPOFOL N/A 01/02/2021   Procedure: COLONOSCOPY WITH PROPOFOL;  Surgeon: Earline MayotteByrnett, Jeffrey W, MD;  Location: [facility] ENDOSCOPY;  Service: Endoscopy;  Laterality: N/A;   denture surgery     OOPHORECTOMY      PORTACATH PLACEMENT N/A 04/01/2016   Procedure: INSERTION PORT-A-CATH;  Surgeon: Earline MayotteJeffrey W Byrnett, MD;  Location: [facility] ORS;  Service: General;  Laterality: N/A;   SKIN CANCER EXCISION Right 2015   foot/ Dr Margo AyeHall   TUBAL LIGATION     TUMOR EXCISION Right    FOOT    Allergies: Allergies  Allergen Reactions   Bee Pollen Hives   Mosquito (Diagnostic) Hives   Other Other (See Comments)    Uncoded Allergy. Allergen: VICRYL  (90% glycolide and 10% L-lactide [polyglactin-- synthetic absorbable sterile surgical suture]), Other Reaction: Infection   Gabapentin Swelling   Mosquito (Culex Pipiens) Allergy Skin Test Hives   Norvasc [Amlodipine Besylate] Swelling    TO FEET   Lisinopril Cough    Home Medications: (Not in a hospital admission)  Home medication reconciliation was completed with the patient.   Scheduled Inpatient Medications:    vitamin C  1,000 mg Oral Daily   atorvastatin  10 mg Oral Daily   enoxaparin (LOVENOX) injection  40 mg Subcutaneous Q24H   losartan  50 mg Oral Daily    Continuous Inpatient Infusions:    sodium chloride 100 mL/hr at 07/18/22 0816   magnesium sulfate bolus IVPB      PRN Inpatient Medications:  acetaminophen **OR** acetaminophen, fluticasone, hydrALAZINE, hydrALAZINE, melatonin, ondansetron **OR** ondansetron (ZOFRAN) IV, senna-docusate  Family History: family history includes Breast cancer in her cousin and sister; Emphysema in her father; Heart disease in her mother; Transient ischemic attack in her mother.  The patient's family history is negative for inflammatory bowel disorders, GI malignancy, or solid organ transplantation.  Social History:   reports that she quit smoking about 28 years ago. Her smoking use included cigarettes. She has a 20.00 pack-year smoking history. She has never used smokeless tobacco. She reports that she does not drink alcohol and does not use drugs. The patient denies ETOH, tobacco, or drug use.   Review of  Systems: Constitutional: Weight is stable.  Eyes: No changes in vision. ENT: No oral lesions, sore throat.  GI: see HPI.  Heme/Lymph: No easy bruising.  CV: No chest pain.  GU: No hematuria.  Integumentary: No rashes.  Neuro: No headaches.  Psych: No depression/anxiety.  Endocrine: No heat/cold intolerance.  Allergic/Immunologic: No urticaria.  Resp: No cough, SOB.  Musculoskeletal: No joint swelling.    Physical Examination: BP 129/66   Pulse 80   Temp 97.8 F (36.6 C)   Resp (!) 21   Wt 72.1 kg   SpO2 99%   BMI 26.46 kg/m  Gen: NAD, alert and oriented x 4 HEENT: PEERLA, EOMI, Neck: supple, no JVD or thyromegaly Chest: CTA bilaterally, no wheezes, crackles, or other adventitious sounds CV: RRR, no m/g/c/r Abd: soft, NT, ND, +BS in all four quadrants; no HSM, guarding, ridigity, or rebound tenderness Ext: no edema, well perfused with 2+ pulses, Skin: no rash or lesions noted Lymph: no LAD  Data: Lab Results  Component Value Date   WBC 6.5 07/18/2022   HGB 10.6 (L) 07/18/2022   HCT 29.6 (L) 07/18/2022   MCV 87.6 07/18/2022   PLT 202 07/18/2022   Recent Labs  Lab 07/16/22 1322 07/17/22 1258 07/18/22  0520  HGB 12.2 12.3 10.6*   Lab Results  Component Value Date   NA 124 (L) 07/18/2022   K 4.1 07/18/2022   CL 94 (L) 07/18/2022   CO2 23 07/18/2022   BUN 10 07/18/2022   CREATININE 0.80 07/18/2022   Lab Results  Component Value Date   ALT 34 07/17/2022   AST 37 07/17/2022   ALKPHOS 54 07/17/2022   BILITOT 1.1 07/17/2022   No results for input(s): "APTT", "INR", "PTT" in the last 168 hours.  CSY 01/02/2021 (Byrnett) - diverticulosis in rectosigmoid, sigmoid, and ascending colon, one 10 mm TA removed from proximal transverse colon, one 10 mm TA removed from sigmoid colon  Assessment/Plan:  82 y/o Caucasian female with a PMH of HTN, HLD, GERD, history of breast cancer s/p chemoradiation, season allergies, and reflux sympathetic dystrophy presented to the  [facility] ED 4/4 for chief complaint of nausea, vomiting, diarrhea, and malaise found to have significant hyponatremia. GI consulted for further evaluation of esophageal dysphagia.   Esophageal dysphagia - BaS this afternoon commented on patulous esophagus with severe dysmotility and 13 mm barium tablet got stuck at GEJ which could represent distal esophageal stricture  Esophageal dysmotility - likely age-related changes of esophagus   Hyponatremia   Electrolyte derangement - hypokalemia, hypomagnesemia   Acute gastroenteritis  Left hydronephrosis and urinary retention  History of breast cancer   Recommendations:  - Continue supportive care per primary team with antiemetics, pain control prn, IV fluid hydration, and management of medical comorbidities - Correct hyponatremia per primary team - Reviewed barium esophagogram results with patient and discussed recommendation to proceed with EGD +/- dilatation +/- esophageal biopsies for definitive evaluation - EGD tomorrow AM with Dr. Norma Fredrickson - NPO after midnight. Continue diet per primary team for now.  - Strict anti-reflux precautions reviewed  - See procedure note for findings and further recommendations  I reviewed the risks (including bleeding, perforation, infection, anesthesia complications, cardiac/respiratory complications), benefits and alternatives of EGD. Patient consents to proceed.    Thank you for the consult. Please call with questions or concerns.  Gilda Crease, PA-C [facility] [phone]

## 2022-07-18 NOTE — Procedures (Signed)
Procedure note  Diagnosis: Urinary retention/stenotic urethra  Procedure: Urethral dilation  Description: A 14 French Walther sound was lubricated and resistance noted within the first few millimeters of insertion of the sound.  Using gentle firm pressure the sound was able to be advanced into the bladder with return of clear urine.  On removal of the sound significant tightness was noted.  A 16 French sound was inserted in a similar fashion.  A 14 French Coloplast coud catheter was then able to be advanced in the bladder and the balloon was inflated with 10 cc of sterile water.  Urine was clear.  Recommendation:  Indwelling Foley catheter x 1 week   Irineo Axon, MD

## 2022-07-18 NOTE — ED Notes (Signed)
Pt given crackers and water, pt denies other needs at this time

## 2022-07-18 NOTE — ED Notes (Signed)
Dr. Lonna Cobb states not to move pt. Upstairs, he is on the way to attempt to cath pt. RN confirms.

## 2022-07-18 NOTE — ED Notes (Signed)
Pt transferred to ED room 3. Pt hooked up to cardiac monitor and vital machine. Call bell within reach. Pt was provided ginger ale at her request. Pt has no other needs at this time.

## 2022-07-18 NOTE — ED Notes (Signed)
Pt ambulated to toilet, new brief placed and chuks on bed.

## 2022-07-19 ENCOUNTER — Encounter: Payer: Self-pay | Admitting: Internal Medicine

## 2022-07-19 ENCOUNTER — Inpatient Hospital Stay: Payer: PPO | Admitting: Anesthesiology

## 2022-07-19 ENCOUNTER — Inpatient Hospital Stay: Payer: PPO

## 2022-07-19 ENCOUNTER — Encounter
Admission: EM | Disposition: A | Discharge status: Home / Self Care | Payer: Self-pay | Source: Home / Self Care | Attending: Internal Medicine

## 2022-07-19 DIAGNOSIS — R112 Nausea with vomiting, unspecified: Secondary | ICD-10-CM

## 2022-07-19 DIAGNOSIS — K449 Diaphragmatic hernia without obstruction or gangrene: Secondary | ICD-10-CM | POA: Diagnosis not present

## 2022-07-19 DIAGNOSIS — I10 Essential (primary) hypertension: Secondary | ICD-10-CM | POA: Diagnosis not present

## 2022-07-19 DIAGNOSIS — E871 Hypo-osmolality and hyponatremia: Secondary | ICD-10-CM | POA: Diagnosis not present

## 2022-07-19 DIAGNOSIS — R197 Diarrhea, unspecified: Secondary | ICD-10-CM | POA: Diagnosis not present

## 2022-07-19 HISTORY — PX: ESOPHAGOGASTRODUODENOSCOPY (EGD) WITH PROPOFOL: SHX5813

## 2022-07-19 LAB — BASIC METABOLIC PANEL
Anion gap: 8 (ref 5–15)
Anion gap: 9 (ref 5–15)
BUN: 12 mg/dL (ref 8–23)
BUN: 12 mg/dL (ref 8–23)
CO2: 22 mmol/L (ref 22–32)
CO2: 22 mmol/L (ref 22–32)
Calcium: 8.1 mg/dL — ABNORMAL LOW (ref 8.9–10.3)
Calcium: 8.1 mg/dL — ABNORMAL LOW (ref 8.9–10.3)
Chloride: 96 mmol/L — ABNORMAL LOW (ref 98–111)
Chloride: 96 mmol/L — ABNORMAL LOW (ref 98–111)
Creatinine, Ser: 0.81 mg/dL (ref 0.44–1.00)
Creatinine, Ser: 0.8 mg/dL (ref 0.44–1.00)
GFR, Estimated: >60 mL/min (ref >60)
GFR, Estimated: >60 mL/min (ref >60)
Glucose, Bld: 94 mg/dL (ref 70–99)
Glucose, Bld: 134 mg/dL — ABNORMAL HIGH (ref 70–99)
Potassium: 4.3 mmol/L (ref 3.5–5.1)
Potassium: 4 mmol/L (ref 3.5–5.1)
Sodium: 126 mmol/L — ABNORMAL LOW (ref 135–145)
Sodium: 127 mmol/L — ABNORMAL LOW (ref 135–145)

## 2022-07-19 LAB — SODIUM: Sodium: 119 mmol/L — CL (ref 135–145)

## 2022-07-19 LAB — CBC
HCT: 29.2 % — ABNORMAL LOW (ref 36.0–46.0)
Hemoglobin: 10.4 g/dL — ABNORMAL LOW (ref 12.0–15.0)
MCH: 31.2 pg (ref 26.0–34.0)
MCHC: 35.6 g/dL (ref 30.0–36.0)
MCV: 87.7 fL (ref 80.0–100.0)
Platelets: 200 10*3/uL (ref 150–400)
RBC: 3.33 MIL/uL — ABNORMAL LOW (ref 3.87–5.11)
RDW: 11.8 % (ref 11.5–15.5)
WBC: 5.6 10*3/uL (ref 4.0–10.5)
nRBC: 0 % (ref 0.0–0.2)

## 2022-07-19 LAB — MAGNESIUM: Magnesium: 1.9 mg/dL (ref 1.7–2.4)

## 2022-07-19 LAB — PHOSPHORUS: Phosphorus: 3 mg/dL (ref 2.5–4.6)

## 2022-07-19 SURGERY — ESOPHAGOGASTRODUODENOSCOPY (EGD) WITH PROPOFOL
Anesthesia: General

## 2022-07-19 MED ORDER — CHLORHEXIDINE GLUCONATE CLOTH 2 % EX PADS: 6.0000 | MEDICATED_PAD | Freq: Every day | CUTANEOUS | Status: DC

## 2022-07-19 MED ORDER — SODIUM CHLORIDE 0.9 % IV SOLN: INTRAVENOUS | Status: DC

## 2022-07-19 MED ORDER — LIDOCAINE HCL (PF) 2 % IJ SOLN
INTRAMUSCULAR | Status: AC
  Filled 2022-07-19: qty 5

## 2022-07-19 MED ORDER — PROPOFOL 10 MG/ML IV BOLUS
INTRAVENOUS | Status: DC | PRN
  Administered 2022-07-19: 60 mg via INTRAVENOUS

## 2022-07-19 MED ORDER — PROPOFOL 500 MG/50ML IV EMUL
INTRAVENOUS | Status: DC | PRN
  Administered 2022-07-19: 150 ug/kg/min via INTRAVENOUS

## 2022-07-19 MED ORDER — LIDOCAINE HCL (CARDIAC) PF 100 MG/5ML IV SOSY
PREFILLED_SYRINGE | INTRAVENOUS | Status: DC | PRN
  Administered 2022-07-19: 40 mg via INTRAVENOUS

## 2022-07-19 NOTE — Interval H&P Note (Signed)
History and Physical Interval Note:  07/19/2022 9:59 AM  Catherine Tate  has presented today for surgery, with the diagnosis of Esophageal dysphagia, abnormal barium swallow.  The various methods of treatment have been discussed with the patient and family. After consideration of risks, benefits and other options for treatment, the patient has consented to  Procedure(s): ESOPHAGOGASTRODUODENOSCOPY (EGD) WITH PROPOFOL (N/A) as a surgical intervention.  The patient's history has been reviewed, patient examined, no change in status, stable for surgery.  I have reviewed the patient's chart and labs.  Questions were answered to the patient's satisfaction.     [place], [place]

## 2022-07-19 NOTE — Progress Notes (Signed)
Progress Note    Catherine Tate  MWU:[id number] DOB: October 27, 1940  DOA: 07/17/2022 PCP: Doreene Nestlark, Katherine K, NP      Brief Narrative:    Medical records reviewed and are as summarized below:  Catherine Tate is a 82 y.o. female female with history of left ER/PR positive, HER2/neu negative invasive breast cancer in the upper outer quadrant, GERD, hypertension, hyperlipidemia, who presents to the emergency department for chief concerns of nausea, vomiting, diarrhea and hyponatremia.  ED workup serum sodium 118, glucose 164 CXR negative  [facility] hospitalist consulted for admission and further management as below.      Assessment/Plan:   Principal Problem:   Hyponatremia Active Problems:   Essential hypertension   History of breast cancer   Hyperlipidemia   Osteoporosis   Acute diarrhea   Nausea and vomiting   Body mass index is 26.19 kg/m.   Hypoosmolar hyponatremia: Sodium is up to 126.  Sodium was 118 on admission.  She had recent vomiting and diarrhea which could be contributing to this.  Sodium was 134 2 weeks ago.   Acute gastroenteritis: Etiology unclear.  Improved.    Dysphagia, abnormal barium swallow: S/p EGD which showed moderate tortuosity of the mid to distal esophagus compatible with presbyesophagus, 2 cm hiatal hernia.  Outpatient follow-up with GI for ambulatory esophageal manometry.  GI has signed off.   Hypokalemia and hypomagnesemia: Improved   Urethral stenosis, urinary retention, left hydronephrosis, chronic dysuria: S/p Foley catheter placement on 07/18/2022.  Repeat renal ultrasound tomorrow.  Outpatient follow-up with urologist for voiding trial.   Other comorbidities include history of breast cancer, hypertension, hyperlipidemia    Diet Order             Diet full liquid Room service appropriate? Yes; Fluid consistency: Thin  Diet effective now                             Consultants: Gastroenterologist  Procedures: EGD    Medications:    vitamin C  1,000 mg Oral Daily   atorvastatin  10 mg Oral Daily   enoxaparin (LOVENOX) injection  40 mg Subcutaneous Q24H   losartan  50 mg Oral Daily   Continuous Infusions:  sodium chloride 75 mL/hr at 07/19/22 0930     Anti-infectives (From admission, onward)    None              Family Communication/Anticipated D/C date and plan/Code Status   DVT prophylaxis: enoxaparin (LOVENOX) injection 40 mg Start: 07/17/22 2200 Place TED hose Start: 07/17/22 1428     Code Status: DNR  Family Communication: None Disposition Plan: Plan to discharge home tomorrow   Status is: Inpatient Remains inpatient appropriate because: Hyponatremia       Subjective:   Interval events noted.  Diarrhea has improved.  No vomiting no abdominal pain.  Objective:    Vitals:   07/19/22 1019 07/19/22 1026 07/19/22 1036 07/19/22 1255  BP:  (!) 155/81 (!) 179/80 137/66  Pulse: 87 82 79 82  Resp: 17 19 16 18   Temp:    98.7 F (37.1 C)  TempSrc:      SpO2: 97% 99% 100% 98%  Weight:      Height:       No data found.   Intake/Output Summary (Last 24 hours) at 07/19/2022 1338 Last data filed at 07/19/2022 1011 Gross per 24 hour  Intake 340 ml  Output 1150  ml  Net -810 ml   Filed Weights   07/17/22 1254 07/18/22 1722 07/19/22 0306  Weight: 72.1 kg 71.6 kg 71.4 kg    Exam:  GEN: NAD SKIN: No rash EYES: EOMI ENT: MMM CV: RRR PULM: CTA B ABD: soft, ND, NT, +BS CNS: AAO x 3, non focal EXT: No edema or tenderness GU: Foley catheter draining amber urine       Data Reviewed:   I have personally reviewed following labs and imaging studies:  Labs: Labs show the following:   Basic Metabolic Panel: Recent Labs  Lab 07/17/22 1258 07/17/22 1555 07/18/22 0009 07/18/22 0520 07/18/22 1238 07/18/22 1700 07/19/22 0437  NA 118*   < > 119* 121* 124* 122* 126*  K 4.0   --   --  3.2* 4.1 4.3 4.3  CL 84*  --   --  91* 94* 90* 96*  CO2 25  --   --  23 23 23 22   GLUCOSE 164*  --   --  122* 121* 113* 94  BUN 13  --   --  11 10 12 12   CREATININE 0.96  --   --  0.83 0.80 0.79 0.81  CALCIUM 9.4  --   --  8.4* 8.1* 8.6* 8.1*  MG 1.7  --   --  1.5*  --   --  1.9  PHOS  --   --   --  2.8  --   --  3.0   < > = values in this interval not displayed.   GFR Estimated Creatinine Clearance: 54.9 mL/min (by C-G formula based on SCr of 0.81 mg/dL). Liver Function Tests: Recent Labs  Lab 07/17/22 1258  AST 37  ALT 34  ALKPHOS 54  BILITOT 1.1  PROT 7.6  ALBUMIN 4.2   Recent Labs  Lab 07/17/22 1258  LIPASE 35   No results for input(s): "AMMONIA" in the last 168 hours. Coagulation profile No results for input(s): "INR", "PROTIME" in the last 168 hours.  CBC: Recent Labs  Lab 07/16/22 1322 07/17/22 1258 07/18/22 0520 07/19/22 0437  WBC 7.3 7.0 6.5 5.6  NEUTROABS 5.3  --   --   --   HGB 12.2 12.3 10.6* 10.4*  HCT 34.4* 33.8* 29.6* 29.2*  MCV 90.2 86.0 87.6 87.7  PLT 258.0 244 202 200   Cardiac Enzymes: No results for input(s): "CKTOTAL", "CKMB", "CKMBINDEX", "TROPONINI" in the last 168 hours. BNP (last 3 results) No results for input(s): "PROBNP" in the last 8760 hours. CBG: No results for input(s): "GLUCAP" in the last 168 hours. D-Dimer: No results for input(s): "DDIMER" in the last 72 hours. Hgb A1c: No results for input(s): "HGBA1C" in the last 72 hours. Lipid Profile: No results for input(s): "CHOL", "HDL", "LDLCALC", "TRIG", "CHOLHDL", "LDLDIRECT" in the last 72 hours. Thyroid function studies: No results for input(s): "TSH", "T4TOTAL", "T3FREE", "THYROIDAB" in the last 72 hours.  Invalid input(s): "FREET3" Anemia work up: No results for input(s): "VITAMINB12", "FOLATE", "FERRITIN", "TIBC", "IRON", "RETICCTPCT" in the last 72 hours. Sepsis Labs: Recent Labs  Lab 07/16/22 1322 07/17/22 1258 07/18/22 0520 07/19/22 0437  WBC 7.3  7.0 6.5 5.6    Microbiology Recent Results (from the past 240 hour(s))  Urine Culture     Status: None   Collection Time: 07/16/22 12:56 PM   Specimen: Urine  Result Value Ref Range Status   MICRO NUMBER: [id number]  Final   SPECIMEN QUALITY: Adequate  Final   Sample Source URINE  Final  STATUS: FINAL  Final   Result:   Final    Mixed genital flora isolated. These superficial bacteria are not indicative of a urinary tract infection. No further organism identification is warranted on this specimen. If clinically indicated, recollect clean-catch, mid-stream urine and transfer  immediately to Urine Culture Transport Tube.     Procedures and diagnostic studies:  DG ESOPHAGUS W SINGLE CM (SOL OR THIN BA)  Result Date: 07/18/2022 CLINICAL DATA:  Patient referred to ED by PCP for hyponatremia. Patient complains of poor p.o. intake with nausea and no vomiting. Patient was referred for fluoroscopic esophagram study to rule out esophageal stricture. EXAM: ESOPHAGUS/BARIUM SWALLOW/TABLET STUDY TECHNIQUE: Single contrast examination was performed using thin liquid barium. This exam was performed by Alex Gardener, NP, and was supervised and interpreted by Lesia Hausen, MD. FLUOROSCOPY: Radiation Exposure Index (as provided by the fluoroscopic device): 55.50 mGy Kerma COMPARISON:  None Available. FINDINGS: Limited exam due to patient's inability to stand or position on fluoroscopy table. Swallowing: Appears normal. No vestibular penetration or aspiration seen. Pharynx: Unremarkable. Esophagus: The esophagus is patulous without suspicious mucosal lesion. Esophageal motility: Severe esophageal dysmotility with tertiary contractions. Hiatal Hernia: None. Gastroesophageal reflux: No gastroesophageal reflux with provocation maneuvers. Ingested 65mm barium tablet: 13 mm barium tablet stuck in narrowing at GE junction and did not pass with multiple swallows of water and barium. Other: None. IMPRESSION: 1. Limited exam  due to patient's inability to stand and position on fluoroscopy table. 2. Patulous esophagus with severe dysmotility. 3. The 13 mm tablet got stuck at the GE junction which could reflect a stricture. Correlate with endoscopy as indicated. Electronically Signed   By: Lesia Hausen M.D.   On: 07/18/2022 15:37   US RENAL  Result Date: 07/18/2022 CLINICAL DATA:  Dysuria EXAM: RENAL / URINARY TRACT ULTRASOUND COMPLETE COMPARISON:  CT 04/01/2006 FINDINGS: Right Kidney: Renal measurements: 9.7 x 5.1 x 5.9 cm = volume: 154 mL. Echogenicity within normal limits. No mass or hydronephrosis visualized. Left Kidney: Renal measurements: 8.4 x 4.6 x 3.3 cm = volume: 68 mL. Echogenicity within normal limits. Severe left hydronephrosis. No mass or shadowing stone visualized. Bladder: Appears normal for degree of bladder distention. Bilateral ureteral jets were seen. Other: None. IMPRESSION: Severe left hydronephrosis. Electronically Signed   By: Duanne Guess D.O.   On: 07/18/2022 11:20   DG Chest Port 1 View  Result Date: 07/17/2022 CLINICAL DATA:  Weakness.  Hyponatremia. EXAM: PORTABLE CHEST 1 VIEW COMPARISON:  Radiographs 04/01/2016. FINDINGS: 1454 hours. The heart size and mediastinal contours are stable. The lungs appear clear. There is no pleural effusion or pneumothorax. No acute osseous findings are evident. Numerous telemetry leads overlie the chest. IMPRESSION: No evidence of active cardiopulmonary process. Electronically Signed   By: Carey Bullocks M.D.   On: 07/17/2022 15:06               LOS: 2 days   Anvika Gashi  [facility]   Pager on [email or web address]. If 7PM-7AM, please contact night-coverage at [email or web address]     07/19/2022, 1:38 PM

## 2022-07-19 NOTE — Anesthesia Preprocedure Evaluation (Signed)
Anesthesia Evaluation  Patient identified by MRN, date of birth, ID band Patient awake    Reviewed: Allergy & Precautions, NPO status , Patient's Chart, lab work & pertinent test results  History of Anesthesia Complications (+) PONV and history of anesthetic complications  Airway Mallampati: III  TM Distance: <3 FB Neck ROM: full    Dental  (+) Missing   Pulmonary neg shortness of breath, former smoker    + decreased breath sounds      Cardiovascular Exercise Tolerance: Good hypertension, (-) angina Normal cardiovascular exam     Neuro/Psych  Neuromuscular disease  negative psych ROS   GI/Hepatic Neg liver ROS,GERD  ,,  Endo/Other  diabetes, Type 2    Renal/GU Renal disease  negative genitourinary   Musculoskeletal   Abdominal   Peds  Hematology negative hematology ROS (+)   Anesthesia Other Findings Patient reports that they do not think that any food or pills are stuck in their throat at this time.  That she can actively swallow liquids at this time.  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/03/2018: Cataract 09/23/2021: Cellulitis of left lower extremity 05/15/2017: Chest wall abscess No date: Chickenpox No date: Complication of anesthesia No date: Frequent UTI No date: GERD (gastroesophageal reflux disease)     Comment:  RARE No date: Hypertension 2017/2018: Personal history of chemotherapy     Comment:  F/U left breast cancer 2018: Personal history of radiation therapy     Comment:  F/U left breast cancer No date: PONV (postoperative nausea and vomiting) No date: RSD (reflex sympathetic dystrophy) No date: Seasonal allergies No date: Skin cancer No date: Urine incontinence  Past Surgical History: 2005: ABDOMINAL HYSTERECTOMY     Comment:  total/ Dr Barnabas [place]ister[place] 2006: BLADDER TUMOR EXCISION 03/13/2016: BREAST BIOPSY; Left      Comment:  INVASIVE MAMMARY CARCINOMA 2018: BREAST LUMPECTOMY; Left 07/14/2016: BREAST LUMPECTOMY WITH SENTINEL LYMPH NODE BIOPSY; Left     Comment:  Procedure: BREAST LUMPECTOMY WITH SENTINEL LYMPH NODE               BX;  Surgeon: Earline MayotteJeffrey W Byrnett, MD;  Location: [facility] ORS;              Service: General;  Laterality: Left; 01/02/2021: COLONOSCOPY WITH PROPOFOL; N/A     Comment:  Procedure: COLONOSCOPY WITH PROPOFOL;  Surgeon: Earline MayotteByrnett,              Jeffrey W, MD;  Location: [facility] ENDOSCOPY;  Service:               Endoscopy;  Laterality: N/A; No date: denture surgery No date: OOPHORECTOMY 04/01/2016: PORTACATH PLACEMENT; N/A     Comment:  Procedure: INSERTION PORT-A-CATH;  Surgeon: Earline MayotteJeffrey W               Byrnett, MD;  Location: [facility] ORS;  Service: General;                Laterality: N/A; 2015: SKIN CANCER EXCISION; Right     Comment:  foot/ Dr Margo AyeHall No date: TUBAL LIGATION No date: TUMOR EXCISION; Right     Comment:  FOOT  BMI    Body Mass Index: 26.19 kg/m      Reproductive/Obstetrics negative OB ROS                             Anesthesia Physical Anesthesia Plan  ASA: 3  Anesthesia Plan: General   Post-op Pain Management:    Induction: Intravenous  PONV Risk Score and Plan: Propofol infusion and TIVA  Airway Management Planned: Natural Airway and Nasal Cannula  Additional Equipment:   Intra-op Plan:   Post-operative Plan:   Informed Consent: I have reviewed the patients History and Physical, chart, labs and discussed the procedure including the risks, benefits and alternatives for the proposed anesthesia with the patient or authorized representative who has indicated his/her understanding and acceptance.   Patient has DNR.  Discussed DNR with patient and Suspend DNR.   Dental Advisory Given  Plan Discussed with: Anesthesiologist, CRNA and Surgeon  Anesthesia Plan Comments: (Patient consented for risks of anesthesia including but not  limited to:  - adverse reactions to medications - risk of airway placement if required - damage to eyes, teeth, lips or other oral mucosa - nerve damage due to positioning  - sore throat or hoarseness - Damage to heart, brain, nerves, lungs, other parts of body or loss of life  Patient voiced understanding.)       Anesthesia Quick Evaluation

## 2022-07-19 NOTE — Transfer of Care (Signed)
Immediate Anesthesia Transfer of Care Note  Patient: TIMESHA BROWNLEY  Procedure(s) Performed: Procedure(s): ESOPHAGOGASTRODUODENOSCOPY (EGD) WITH PROPOFOL (N/A)  Patient Location: PACU and Endoscopy Unit  Anesthesia Type:General  Level of Consciousness: sedated  Airway & Oxygen Therapy: Patient Spontanous Breathing and Patient connected to nasal cannula oxygen  Post-op Assessment: Report given to RN and Post -op Vital signs reviewed and stable  Post vital signs: Reviewed and stable  Last Vitals:  Vitals:   07/19/22 0750 07/19/22 1016  BP: 123/84 (!) 153/70  Pulse: 86 93  Resp: 20 16  Temp: 36.9 C (!) 36.2 C  SpO2: 98% 96%    Complications: No apparent anesthesia complications

## 2022-07-19 NOTE — Op Note (Signed)
[facility] Patient Name: Catherine Tate Procedure Date: 07/19/2022 9:52 AM MRN: [id number] Account #: [id number] Date of Birth: [birth year] Admit Type: Inpatient Age: 82 Room: [facility] ENDO ROOM 4 Gender: Female Note Status: Finalized Instrument Name: Upper Endoscope [id number] Procedure:             Upper GI endoscopy Indications:           Dysphagia, Abnormal UGI series Providers:             Boykin Nearing. Norma Fredrickson MD, MD Referring MD:          Doreene Nest (Referring MD) Medicines:             Propofol per Anesthesia Complications:         No immediate complications. Procedure:             Pre-Anesthesia Assessment:                        - The risks and benefits of the procedure and the                         sedation options and risks were discussed with the                         patient. All questions were answered and informed                         consent was obtained.                        - Patient identification and proposed procedure were                         verified prior to the procedure by the nurse. The                         procedure was verified in the procedure room.                        - ASA Grade Assessment: III - A patient with severe                         systemic disease.                        - After reviewing the risks and benefits, the patient                         was deemed in satisfactory condition to undergo the                         procedure.                        After obtaining informed consent, the endoscope was                         passed under direct vision. Throughout the procedure,  the patient's blood pressure, pulse, and oxygen                         saturations were monitored continuously. The Endoscope                         was introduced through the mouth, and advanced to the                         third part of duodenum. The upper GI endoscopy was                          accomplished without difficulty. The patient tolerated                         the procedure well. Findings:      Moderate tortuosity of the mid to distal esophagus was noted compatible       with a diagnosis of Presbyesophagus.      There is no endoscopic evidence of stenosis, stricture, ulcerations or       mass in the entire esophagus.      A 2 cm hiatal hernia was present.      The exam of the stomach was otherwise normal.      The examined duodenum was normal.      The exam was otherwise without abnormality. Impression:            - 2 cm hiatal hernia.                        - Normal examined duodenum.                        - The examination was otherwise normal.                        - No specimens collected. Recommendation:        - Return patient to hospital ward for ongoing care.                        - Full liquid diet.                        - Advance diet as tolerated.                        - Perform ambulatory esophageal manometry at                         appointment to be scheduled.                        - [facility] will sign off for now. Call us back if any                         changes clinically or if we can help for any reason. Procedure Code(s):     --- Professional ---                        [phone], Esophagogastroduodenoscopy, flexible,  transoral; diagnostic, including collection of                         specimen(s) by brushing or washing, when performed                         (separate procedure) Diagnosis Code(s):     --- Professional ---                        R93.3, Abnormal findings on diagnostic imaging of                         other parts of digestive tract                        R13.10, Dysphagia, unspecified                        K44.9, Diaphragmatic hernia without obstruction or                         gangrene CPT copyright 2022 American Medical Association. All rights reserved. The codes documented in  this report are preliminary and upon coder review may  be revised to meet current compliance requirements. Stanton Kidneyeodoro K Maelee Hoot MD, MD 07/19/2022 10:15:57 AM This report has been signed electronically. Number of Addenda: 0 Note Initiated On: 07/19/2022 9:52 AM Estimated Blood Loss:  Estimated blood loss: none.      [facility]

## 2022-07-19 NOTE — Anesthesia Postprocedure Evaluation (Signed)
Anesthesia Post Note  Patient: Catherine Tate  Procedure(s) Performed: ESOPHAGOGASTRODUODENOSCOPY (EGD) WITH PROPOFOL  Patient location during evaluation: Endoscopy Anesthesia Type: General Level of consciousness: awake and alert Pain management: pain level controlled Vital Signs Assessment: post-procedure vital signs reviewed and stable Respiratory status: spontaneous breathing, nonlabored ventilation, respiratory function stable and patient connected to nasal cannula oxygen Cardiovascular status: blood pressure returned to baseline and stable Postop Assessment: no apparent nausea or vomiting Anesthetic complications: no   No notable events documented.   Last Vitals:  Vitals:   07/19/22 1036 07/19/22 1255  BP: (!) 179/80 137/66  Pulse: 79 82  Resp: 16 18  Temp:  37.1 C  SpO2: 100% 98%    Last Pain:  Vitals:   07/19/22 1036  TempSrc:   PainSc: 0-No pain                 Cleda Mccreedy Catherine Tate

## 2022-07-19 NOTE — Anesthesia Procedure Notes (Signed)
Date/Time: 07/19/2022 10:10 AM  Performed by: Stormy Fabian, CRNAPre-anesthesia Checklist: Patient identified, Emergency Drugs available, Suction available and Patient being monitored Patient Re-evaluated:Patient Re-evaluated prior to induction Oxygen Delivery Method: Nasal cannula Induction Type: IV induction Dental Injury: Teeth and Oropharynx as per pre-operative assessment  Comments: Nasal cannula with etCO2 monitoring

## 2022-07-20 ENCOUNTER — Other Ambulatory Visit: Payer: Self-pay

## 2022-07-20 DIAGNOSIS — R338 Other retention of urine: Secondary | ICD-10-CM | POA: Diagnosis present

## 2022-07-20 DIAGNOSIS — E871 Hypo-osmolality and hyponatremia: Secondary | ICD-10-CM | POA: Diagnosis not present

## 2022-07-20 DIAGNOSIS — K2289 Other specified disease of esophagus: Secondary | ICD-10-CM | POA: Diagnosis present

## 2022-07-20 DIAGNOSIS — N1330 Unspecified hydronephrosis: Secondary | ICD-10-CM | POA: Diagnosis present

## 2022-07-20 HISTORY — DX: Other retention of urine: R33.8

## 2022-07-20 LAB — CBC
HCT: 29.2 % — ABNORMAL LOW (ref 36.0–46.0)
Hemoglobin: 10.2 g/dL — ABNORMAL LOW (ref 12.0–15.0)
MCH: 31.4 pg (ref 26.0–34.0)
MCHC: 34.9 g/dL (ref 30.0–36.0)
MCV: 89.8 fL (ref 80.0–100.0)
Platelets: 186 10*3/uL (ref 150–400)
RBC: 3.25 MIL/uL — ABNORMAL LOW (ref 3.87–5.11)
RDW: 11.8 % (ref 11.5–15.5)
WBC: 5.8 10*3/uL (ref 4.0–10.5)
nRBC: 0 % (ref 0.0–0.2)

## 2022-07-20 LAB — BASIC METABOLIC PANEL
Anion gap: 7 (ref 5–15)
BUN: 10 mg/dL (ref 8–23)
CO2: 22 mmol/L (ref 22–32)
Calcium: 8.2 mg/dL — ABNORMAL LOW (ref 8.9–10.3)
Chloride: 101 mmol/L (ref 98–111)
Creatinine, Ser: 0.74 mg/dL (ref 0.44–1.00)
GFR, Estimated: >60 mL/min (ref >60)
Glucose, Bld: 91 mg/dL (ref 70–99)
Potassium: 4.1 mmol/L (ref 3.5–5.1)
Sodium: 130 mmol/L — ABNORMAL LOW (ref 135–145)

## 2022-07-20 LAB — PHOSPHORUS: Phosphorus: 3 mg/dL (ref 2.5–4.6)

## 2022-07-20 LAB — MAGNESIUM: Magnesium: 1.9 mg/dL (ref 1.7–2.4)

## 2022-07-20 MED ORDER — CHLORHEXIDINE GLUCONATE CLOTH 2 % EX PADS
6.0000 | MEDICATED_PAD | Freq: Every day | CUTANEOUS | Status: DC
  Administered 2022-07-20: 6 via TOPICAL

## 2022-07-20 NOTE — Discharge Summary (Addendum)
Physician Discharge Summary   Patient: Catherine Tate MRN: [id number] DOB: 1940/06/09  Admit date:     07/17/2022  Discharge date: 07/20/22  Discharge Physician: Lurene Shadow   PCP: Doreene Nest, NP   Recommendations at discharge:   Follow-up with PCP in 1 week Follow-up with Dr. Lonna Cobb, urologist, in 1 week for voiding trial Follow-up with Dr. Norma Fredrickson, gastroenterologist, within 1 month   Discharge Diagnoses: Principal Problem:   Hyponatremia Active Problems:   Essential hypertension   History of breast cancer   Hyperlipidemia   Osteoporosis   Acute diarrhea   Nausea and vomiting   Hydronephrosis of left kidney   Acute urinary retention   Presbyesophagus  Resolved Problems:   * No resolved hospital problems. [facility] Course:  Catherine Tate is a 82 y.o. female female with history of left ER/PR positive, HER2/neu negative invasive breast cancer in the upper outer quadrant, GERD, hypertension, hyperlipidemia, who presents to the emergency department for chief concerns of nausea, vomiting, diarrhea and hyponatremia.    Assessment and Plan:   Hypoosmolar hyponatremia: Improved.  Sodium has been up to 130. Sodium was 118 on admission.  She had recent vomiting and diarrhea which could be contributing to this.  Sodium was 134 2 weeks prior to admission.    Acute gastroenteritis: Etiology unclear.  Improved.      Dysphagia, abnormal barium swallow: S/p EGD which showed moderate tortuosity of the mid to distal esophagus compatible with presbyesophagus, 2 cm hiatal hernia.  Outpatient follow-up with GI for ambulatory esophageal manometry.  GI has signed off.     Hypokalemia and hypomagnesemia: Improved     Urethral stenosis, urinary retention, left hydronephrosis, chronic dysuria: S/p Foley catheter placement on 07/18/2022.  Repeat renal ultrasound showed only mild left hydronephrosis which is a significant improvement from previous report.  Outpatient follow-up  with urologist for voiding trial.     Other comorbidities include history of breast cancer, hypertension, hyperlipidemia   Her condition has improved and she is deemed stable for discharge to home today.       Consultants: Urologist, gastroenterologist Procedures performed: EGD Disposition: Home Diet recommendation:  Discharge Diet Orders (From admission, onward)     Start     Ordered   07/20/22 0000  Diet - low sodium heart healthy        07/20/22 0857           Cardiac diet DISCHARGE MEDICATION: Allergies as of 07/20/2022       Reactions   Bee Pollen Hives   Mosquito (diagnostic) Hives   Other Other (See Comments)   Uncoded Allergy. Allergen: VICRYL  (90% glycolide and 10% L-lactide [polyglactin-- synthetic absorbable sterile surgical suture]), Other Reaction: Infection   Gabapentin Swelling   Mosquito (culex Pipiens) Allergy Skin Test Hives   Norvasc [amlodipine Besylate] Swelling   TO FEET   Lisinopril Cough        Medication List     STOP taking these medications    hydrochlorothiazide 25 MG tablet Commonly known as: HYDRODIURIL       TAKE these medications    acetaminophen 500 MG tablet Commonly known as: TYLENOL Take 1,000 mg by mouth every 6 (six) hours as needed for moderate pain.   alendronate 70 MG tablet Commonly known as: FOSAMAX Take 1 tablet (70 mg total) by mouth once a week. Take with a full glass of water on an empty stomach.   atorvastatin 10 MG tablet Commonly known as:  LIPITOR TAKE 1 TABLET BY MOUTH ONCE A DAY FOR CHOLSTEROL.   CALCIUM/VITAMIN D PO Take 1,200 mg by mouth.   cetirizine 10 MG tablet Commonly known as: ZYRTEC Take 1 tablet (10 mg total) by mouth daily.   CRANBERRY PO Take 1 capsule by mouth 2 (two) times daily.   fluticasone 50 MCG/ACT nasal spray Commonly known as: FLONASE Place 2 sprays into both nostrils daily.   losartan 50 MG tablet Commonly known as: COZAAR TAKE 1 TABLET BY MOUTH ONCE A DAY FOR  BLOOD PRESSURE.   ondansetron 4 MG disintegrating tablet Commonly known as: ZOFRAN-ODT Take 1 tablet (4 mg total) by mouth every 8 (eight) hours as needed for nausea or vomiting.   oxybutynin 5 MG tablet Commonly known as: DITROPAN Take 1 tablet (5 mg total) by mouth 2 (two) times daily. For urinary incontinence.   PROBIOTIC PO Take 1 tablet by mouth daily.   vitamin C 1000 MG tablet Take 1,000 mg by mouth daily.        Follow-up Information     Stoioff, Verna CzechScott C, MD. Schedule an appointment as soon as possible for a visit in 1 week(s).   Specialty: Urology Contact information: [address] [place] [address] [phone]         Stanton Kidneyoledo, Teodoro K, MD. Schedule an appointment as soon as possible for a visit in 1 month(s).   Specialty: Gastroenterology Contact information: [address] [place] [address] [phone]                Discharge Exam: Ceasar MonsFiled Weights   07/17/22 1254 07/18/22 1722 07/19/22 0306  Weight: 72.1 kg 71.6 kg 71.4 kg   GEN: NAD SKIN: Warm and dry EYES: No pallor or icterus ENT: MMM CV: RRR PULM: CTA B ABD: soft, ND, NT, +BS CNS: AAO x 3, non focal EXT: No edema or tenderness   Condition at discharge: good  The results of significant diagnostics from this hospitalization (including imaging, microbiology, ancillary and laboratory) are listed below for reference.   Imaging Studies: US RENAL  Result Date: 07/19/2022 CLINICAL DATA:  [id number] Hydronephrosis, left [id number] EXAM: RENAL / URINARY TRACT ULTRASOUND COMPLETE COMPARISON:  July 18, 2022 FINDINGS: Right Kidney: Renal measurements: 10.1 x 5.0 x 4.8 cm = volume: 128 mL. Echogenicity within normal limits. No mass or hydronephrosis visualized. Left Kidney: Renal measurements: 8.6 x 4.1 x 3.1 cm = volume: 57 mL. Echogenicity is the upper limits of normal with cortical thinning. No mass visualized. There is mild residual LEFT-sided hydronephrosis, markedly  improved in comparison to prior. Bladder: Bladder is completely decompressed around a Foley catheter with mildly prominent walls. Other: None. IMPRESSION: 1. There is mild residual LEFT-sided hydronephrosis, markedly improved in comparison to prior. 2. Bladder is completely decompressed around a Foley catheter with mildly prominent walls. Electronically Signed   By: Meda KlinefelterStephanie  Peacock M.D.   On: 07/19/2022 20:00   DG ESOPHAGUS W SINGLE CM (SOL OR THIN BA)  Result Date: 07/18/2022 CLINICAL DATA:  Patient referred to ED by PCP for hyponatremia. Patient complains of poor p.o. intake with nausea and no vomiting. Patient was referred for fluoroscopic esophagram study to rule out esophageal stricture. EXAM: ESOPHAGUS/BARIUM SWALLOW/TABLET STUDY TECHNIQUE: Single contrast examination was performed using thin liquid barium. This exam was performed by Alex GardenerSusan Clapp, NP, and was supervised and interpreted by Lesia HausenPeter Noone, MD. FLUOROSCOPY: Radiation Exposure Index (as provided by the fluoroscopic device): 55.50 mGy Kerma COMPARISON:  None Available. FINDINGS: Limited  exam due to patient's inability to stand or position on fluoroscopy table. Swallowing: Appears normal. No vestibular penetration or aspiration seen. Pharynx: Unremarkable. Esophagus: The esophagus is patulous without suspicious mucosal lesion. Esophageal motility: Severe esophageal dysmotility with tertiary contractions. Hiatal Hernia: None. Gastroesophageal reflux: No gastroesophageal reflux with provocation maneuvers. Ingested 13mm barium tablet: 13 mm barium tablet stuck in narrowing at GE junction and did not pass with multiple swallows of water and barium. Other: None. IMPRESSION: 1. Limited exam due to patient's inability to stand and position on fluoroscopy table. 2. Patulous esophagus with severe dysmotility. 3. The 13 mm tablet got stuck at the GE junction which could reflect a stricture. Correlate with endoscopy as indicated. Electronically Signed   By:  Lesia Hausen M.D.   On: 07/18/2022 15:37   US RENAL  Result Date: 07/18/2022 CLINICAL DATA:  Dysuria EXAM: RENAL / URINARY TRACT ULTRASOUND COMPLETE COMPARISON:  CT 04/01/2006 FINDINGS: Right Kidney: Renal measurements: 9.7 x 5.1 x 5.9 cm = volume: 154 mL. Echogenicity within normal limits. No mass or hydronephrosis visualized. Left Kidney: Renal measurements: 8.4 x 4.6 x 3.3 cm = volume: 68 mL. Echogenicity within normal limits. Severe left hydronephrosis. No mass or shadowing stone visualized. Bladder: Appears normal for degree of bladder distention. Bilateral ureteral jets were seen. Other: None. IMPRESSION: Severe left hydronephrosis. Electronically Signed   By: Duanne Guess D.O.   On: 07/18/2022 11:20   DG Chest Port 1 View  Result Date: 07/17/2022 CLINICAL DATA:  Weakness.  Hyponatremia. EXAM: PORTABLE CHEST 1 VIEW COMPARISON:  Radiographs 04/01/2016. FINDINGS: 1454 hours. The heart size and mediastinal contours are stable. The lungs appear clear. There is no pleural effusion or pneumothorax. No acute osseous findings are evident. Numerous telemetry leads overlie the chest. IMPRESSION: No evidence of active cardiopulmonary process. Electronically Signed   By: Carey Bullocks M.D.   On: 07/17/2022 15:06    Microbiology: Results for orders placed or performed in visit on 07/16/22  Urine Culture     Status: None   Collection Time: 07/16/22 12:56 PM   Specimen: Urine  Result Value Ref Range Status   MICRO NUMBER: [id number]  Final   SPECIMEN QUALITY: Adequate  Final   Sample Source URINE  Final   STATUS: FINAL  Final   Result:   Final    Mixed genital flora isolated. These superficial bacteria are not indicative of a urinary tract infection. No further organism identification is warranted on this specimen. If clinically indicated, recollect clean-catch, mid-stream urine and transfer  immediately to Urine Culture Transport Tube.     Labs: CBC: Recent Labs  Lab 07/16/22 1322  07/17/22 1258 07/18/22 0520 07/19/22 0437 07/20/22 0506  WBC 7.3 7.0 6.5 5.6 5.8  NEUTROABS 5.3  --   --   --   --   HGB 12.2 12.3 10.6* 10.4* 10.2*  HCT 34.4* 33.8* 29.6* 29.2* 29.2*  MCV 90.2 86.0 87.6 87.7 89.8  PLT 258.0 244 202 200 186   Basic Metabolic Panel: Recent Labs  Lab 07/17/22 1258 07/17/22 1555 07/18/22 0520 07/18/22 1238 07/18/22 1700 07/19/22 0437 07/19/22 1648 07/20/22 0506  NA 118*   < > 121* 124* 122* 126* 127* 130*  K 4.0  --  3.2* 4.1 4.3 4.3 4.0 4.1  CL 84*  --  91* 94* 90* 96* 96* 101  CO2 25  --  GLUCOSE 164*  --  122* 121* 113* 94 134* 91  BUN 13  --  11 10 12 12 12 10   CREATININE 0.96  --  0.83 0.80 0.79 0.81 0.80 0.74  CALCIUM 9.4  --  8.4* 8.1* 8.6* 8.1* 8.1* 8.2*  MG 1.7  --  1.5*  --   --  1.9  --  1.9  PHOS  --   --  2.8  --   --  3.0  --  3.0   < > = values in this interval not displayed.   Liver Function Tests: Recent Labs  Lab 07/17/22 1258  AST 37  ALT 34  ALKPHOS 54  BILITOT 1.1  PROT 7.6  ALBUMIN 4.2   CBG: No results for input(s): "GLUCAP" in the last 168 hours.  Discharge time spent: greater than 30 minutes.  Signed: Lurene Shadow, MD [facility] 07/20/2022

## 2022-07-20 NOTE — Progress Notes (Signed)
1 Day Post-Op  Subjective: Catherine Tate is doing well with the foley and has only minimal hydro on repeat US.  She has no flank pain.   ROS:  Review of Systems  All other systems reviewed and are negative.   Anti-infectives: Anti-infectives (From admission, onward)    None       Current Facility-Administered Medications  Medication Dose Route Frequency Provider Last Rate Last Admin   acetaminophen (TYLENOL) tablet 650 mg  650 mg Oral Q6H PRN Cox, Amy N, DO       Or   acetaminophen (TYLENOL) suppository 650 mg  650 mg Rectal Q6H PRN Cox, Amy N, DO       ascorbic acid (VITAMIN C) tablet 1,000 mg  1,000 mg Oral Daily Cox, Amy N, DO   1,000 mg at 07/20/22 0840   atorvastatin (LIPITOR) tablet 10 mg  10 mg Oral Daily Cox, Amy N, DO   10 mg at 07/20/22 0840   Chlorhexidine Gluconate Cloth 2 % PADS 6 each  6 each Topical Q0600 Lamonte Richer, RN       enoxaparin (LOVENOX) injection 40 mg  40 mg Subcutaneous Q24H Cox, Amy N, DO   40 mg at 07/19/22 2228   fluticasone (FLONASE) 50 MCG/ACT nasal spray 2 spray  2 spray Each Nare Daily PRN Cox, Amy N, DO       hydrALAZINE (APRESOLINE) injection 10 mg  10 mg Intravenous Q6H PRN Gillis Santa, MD       hydrALAZINE (APRESOLINE) tablet 50 mg  50 mg Oral Q6H PRN Gillis Santa, MD       losartan (COZAAR) tablet 50 mg  50 mg Oral Daily Cox, Amy N, DO   50 mg at 07/20/22 0840   melatonin tablet 5 mg  5 mg Oral QHS PRN Cox, Amy N, DO   5 mg at 07/20/22 0034   ondansetron (ZOFRAN) tablet 4 mg  4 mg Oral Q6H PRN Cox, Amy N, DO       Or   ondansetron (ZOFRAN) injection 4 mg  4 mg Intravenous Q6H PRN Cox, Amy N, DO       senna-docusate (Senokot-S) tablet 1 tablet  1 tablet Oral QHS PRN Cox, Amy N, DO         Objective: Vital signs in last 24 hours: Temp:  [98.4 F (36.9 C)-99.1 F (37.3 C)] 99.1 F (37.3 C) (04/07 0759) Pulse Rate:  [81-102] 85 (04/07 0759) Resp:  [16-20] 18 (04/07 0759) BP: (122-149)/(57-71) 127/66 (04/07 0759) SpO2:  [98 %-99 %] 98  % (04/07 0759)  Intake/Output from previous day: 04/06 0701 - 04/07 0700 In: 1475 [I.V.:1475] Out: 2050 [Urine:2050] Intake/Output this shift: No intake/output data recorded.   Physical Exam Vitals reviewed.  Constitutional:      Appearance: She is well-developed.  Genitourinary:    Comments: Urine clear in foley tubing.  Neurological:     Mental Status: She is alert.     Lab Results:  Recent Labs    07/19/22 0437 07/20/22 0506  WBC 5.6 5.8  HGB 10.4* 10.2*  HCT 29.2* 29.2*  PLT 200 186   BMET Recent Labs    07/19/22 1648 07/20/22 0506  NA 127* 130*  K 4.0 4.1  CL 96* 101  CO2 22 22  GLUCOSE 134* 91  BUN 12 10  CREATININE 0.80 0.74  CALCIUM 8.1* 8.2*   PT/INR No results for input(s): "LABPROT", "INR" in the last 72 hours. ABG No results for input(s): "PHART", "HCO3"  in the last 72 hours.  Invalid input(s): "PCO2", "PO2"  Studies/Results: US RENAL  Result Date: 07/19/2022 CLINICAL DATA:  287908 Hydronephrosis, left [id number] EXAM: RENAL / URINARY TRACT ULTRASOUND COMPLETE COMPARISON:  July 18, 2022 FINDINGS: Right Kidney: Renal measurements: 10.1 x 5.0 x 4.8 cm = volume: 128 mL. Echogenicity within normal limits. No mass or hydronephrosis visualized. Left Kidney: Renal measurements: 8.6 x 4.1 x 3.1 cm = volume: 57 mL. Echogenicity is the upper limits of normal with cortical thinning. No mass visualized. There is mild residual LEFT-sided hydronephrosis, markedly improved in comparison to prior. Bladder: Bladder is completely decompressed around a Foley catheter with mildly prominent walls. Other: None. IMPRESSION: 1. There is mild residual LEFT-sided hydronephrosis, markedly improved in comparison to prior. 2. Bladder is completely decompressed around a Foley catheter with mildly prominent walls. Electronically Signed   By: Meda Klinefelter M.D.   On: 07/19/2022 20:00   DG ESOPHAGUS W SINGLE CM (SOL OR THIN BA)  Result Date: 07/18/2022 CLINICAL DATA:  Patient  referred to ED by PCP for hyponatremia. Patient complains of poor p.o. intake with nausea and no vomiting. Patient was referred for fluoroscopic esophagram study to rule out esophageal stricture. EXAM: ESOPHAGUS/BARIUM SWALLOW/TABLET STUDY TECHNIQUE: Single contrast examination was performed using thin liquid barium. This exam was performed by Alex Gardener, NP, and was supervised and interpreted by Lesia Hausen, MD. FLUOROSCOPY: Radiation Exposure Index (as provided by the fluoroscopic device): 55.50 mGy Kerma COMPARISON:  None Available. FINDINGS: Limited exam due to patient's inability to stand or position on fluoroscopy table. Swallowing: Appears normal. No vestibular penetration or aspiration seen. Pharynx: Unremarkable. Esophagus: The esophagus is patulous without suspicious mucosal lesion. Esophageal motility: Severe esophageal dysmotility with tertiary contractions. Hiatal Hernia: None. Gastroesophageal reflux: No gastroesophageal reflux with provocation maneuvers. Ingested 105mm barium tablet: 13 mm barium tablet stuck in narrowing at GE junction and did not pass with multiple swallows of water and barium. Other: None. IMPRESSION: 1. Limited exam due to patient's inability to stand and position on fluoroscopy table. 2. Patulous esophagus with severe dysmotility. 3. The 13 mm tablet got stuck at the GE junction which could reflect a stricture. Correlate with endoscopy as indicated. Electronically Signed   By: Lesia Hausen M.D.   On: 07/18/2022 15:37   US RENAL  Result Date: 07/18/2022 CLINICAL DATA:  Dysuria EXAM: RENAL / URINARY TRACT ULTRASOUND COMPLETE COMPARISON:  CT 04/01/2006 FINDINGS: Right Kidney: Renal measurements: 9.7 x 5.1 x 5.9 cm = volume: 154 mL. Echogenicity within normal limits. No mass or hydronephrosis visualized. Left Kidney: Renal measurements: 8.4 x 4.6 x 3.3 cm = volume: 68 mL. Echogenicity within normal limits. Severe left hydronephrosis. No mass or shadowing stone visualized. Bladder:  Appears normal for degree of bladder distention. Bilateral ureteral jets were seen. Other: None. IMPRESSION: Severe left hydronephrosis. Electronically Signed   By: Duanne Guess D.O.   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  The foley is draining well and there was only minimal residual dilation of the left kidney.   Continue foley drainage.       LOS: 3 days    Bjorn Pippin 4/7/2024Patient ID: Catherine Tate, female   DOB: [birth year], 82 y.o.   MRN: [id number]

## 2022-07-20 NOTE — Progress Notes (Signed)
Discussed with the patient the discharge summary/ instruction and proper care regarding foley catheter and follow up check up with urologist.

## 2022-07-21 ENCOUNTER — Telehealth: Payer: Self-pay | Admitting: *Deleted

## 2022-07-21 ENCOUNTER — Other Ambulatory Visit: Payer: PPO

## 2022-07-21 ENCOUNTER — Encounter: Payer: Self-pay | Admitting: Internal Medicine

## 2022-07-21 NOTE — Transitions of Care (Post Inpatient/ED Visit) (Signed)
   07/21/2022  Name: Catherine Tate MRN: [id number] DOB: [birth year]  Today's TOC FU Call Status: Today's TOC FU Call Status:: Unsuccessul Call (1st Attempt) Unsuccessful Call (1st Attempt) Date: 07/21/22  Attempted to reach the patient regarding the most recent Inpatient/ED visit.  Follow Up Plan: Additional outreach attempts will be made to reach the patient to complete the Transitions of Care (Post Inpatient/ED visit) call.   Gean Maidens BSN RN [facility] [phone]

## 2022-07-22 ENCOUNTER — Telehealth: Payer: Self-pay | Admitting: *Deleted

## 2022-07-22 NOTE — Transitions of Care (Post Inpatient/ED Visit) (Signed)
   07/22/2022  Name: Catherine Tate MRN: [id number] DOB: 08-28-1940  Today's TOC FU Call Status: Today's TOC FU Call Status:: Successful TOC FU Call Competed Unsuccessful Call (2nd Attempt) Date: 07/22/22 [facility] FU Call Complete Date: 07/22/22  Transition Care Management Follow-up Telephone Call Date of Discharge: 07/20/22 Discharge Facility: [facility]) Type of Discharge: Inpatient Admission Primary Inpatient Discharge Diagnosis:: Hyponatremia How have you been since you were released from the hospital?: Better Any questions or concerns?: Yes Patient Questions/Concerns:: Patient has a foley catheter on and was to go see the urologist in 1week. Urologist unable to see until [phone]. RN contacted PCP and they can take it out at f/u visit. Patient Questions/Concerns Addressed: Notified Provider of Patient Questions/Concerns (Notified pt family this can be done and scheduled f/u appt)  Items Reviewed: Did you receive and understand the discharge instructions provided?: Yes Medications obtained and verified?: Yes (Medications Reviewed) Any new allergies since your discharge?: No Dietary orders reviewed?: No Do you have support at home?: Yes People in Home: alone Name of Support/Comfort Primary Source: niece wanda  Home Care and Equipment/Supplies: Were Home Health Services Ordered?: No Any new equipment or medical supplies ordered?: No  Functional Questionnaire: Do you need assistance with bathing/showering or dressing?: Yes Do you need assistance with meal preparation?: Yes Do you need assistance with eating?: No Do you have difficulty maintaining continence: No Do you need assistance with getting out of bed/getting out of a chair/moving?: No Do you have difficulty managing or taking your medications?: No  Follow up appointments reviewed: PCP Follow-up appointment confirmed?: Yes Date of PCP follow-up appointment?: 07/24/22 Follow-up Provider: Vernona Rieger NP 12:00 Specialist Hospital Follow-up appointment confirmed?: Yes Date of Specialist follow-up appointment?: 08/13/22 Follow-Up Specialty Provider:: Urologist Do you need transportation to your follow-up appointment?: No Do you understand care options if your condition(s) worsen?: Yes-patient verbalized understanding  SDOH Interventions Today    Flowsheet Row Most Recent Value  SDOH Interventions   Food Insecurity Interventions Intervention Not Indicated  Housing Interventions Intervention Not Indicated  Transportation Interventions Intervention Not Indicated      TOC Interventions Today    Flowsheet Row Most Recent Value  TOC Interventions   TOC Interventions Discussed/Reviewed TOC Interventions Discussed, TOC Interventions Reviewed  [RN made appointment with PCP and also made sure that foley could be removed.]      Interventions Today    Flowsheet Row Most Recent Value  General Interventions   General Interventions Discussed/Reviewed General Interventions Discussed, General Interventions Reviewed, Doctor Visits  Doctor Visits Discussed/Reviewed Doctor Visits Reviewed, Doctor Visits Discussed, Specialist  PCP/Specialist Visits Compliance with follow-up visit       Gean Maidens BSN RN [facility] [phone]

## 2022-07-23 ENCOUNTER — Other Ambulatory Visit: Payer: Self-pay | Admitting: Oncology

## 2022-07-23 ENCOUNTER — Other Ambulatory Visit: Payer: Self-pay | Admitting: Primary Care

## 2022-07-23 DIAGNOSIS — I10 Essential (primary) hypertension: Secondary | ICD-10-CM

## 2022-07-23 DIAGNOSIS — E785 Hyperlipidemia, unspecified: Secondary | ICD-10-CM

## 2022-07-24 ENCOUNTER — Inpatient Hospital Stay: Payer: PPO | Admitting: Primary Care

## 2022-07-25 ENCOUNTER — Ambulatory Visit (INDEPENDENT_AMBULATORY_CARE_PROVIDER_SITE_OTHER): Payer: PPO | Admitting: Primary Care

## 2022-07-25 ENCOUNTER — Encounter: Payer: Self-pay | Admitting: Primary Care

## 2022-07-25 VITALS — BP 162/80 | HR 100 | Temp 98.1°F | Ht 65.0 in | Wt 158.0 lb

## 2022-07-25 DIAGNOSIS — K2289 Other specified disease of esophagus: Secondary | ICD-10-CM | POA: Diagnosis not present

## 2022-07-25 DIAGNOSIS — I10 Essential (primary) hypertension: Secondary | ICD-10-CM

## 2022-07-25 DIAGNOSIS — R338 Other retention of urine: Secondary | ICD-10-CM | POA: Diagnosis not present

## 2022-07-25 DIAGNOSIS — N390 Urinary tract infection, site not specified: Secondary | ICD-10-CM

## 2022-07-25 DIAGNOSIS — N3946 Mixed incontinence: Secondary | ICD-10-CM

## 2022-07-25 DIAGNOSIS — E871 Hypo-osmolality and hyponatremia: Secondary | ICD-10-CM

## 2022-07-25 DIAGNOSIS — N1330 Unspecified hydronephrosis: Secondary | ICD-10-CM

## 2022-07-25 LAB — CBC
Hemoglobin: 12.3 g/dL (ref 11.7–15.5)
MCV: 91.5 fL (ref 80.0–100.0)
WBC: 6.8 10*3/uL (ref 3.8–10.8)

## 2022-07-25 MED ORDER — LOSARTAN POTASSIUM 100 MG PO TABS
100.0000 mg | ORAL_TABLET | Freq: Every day | ORAL | 0 refills | Status: DC
Start: 2022-07-25 — End: 2022-10-19

## 2022-07-25 NOTE — Patient Instructions (Signed)
We increased the dose of your losartan blood pressure pill to 100 mg.   Monitor your blood pressure at home.  Stop by the lab prior to leaving today. I will notify you of your results once received.   Follow up with Urology and GI.  It was a pleasure to see you today!

## 2022-07-25 NOTE — Assessment & Plan Note (Signed)
Resolved.  Follow up with Urology as scheduled.

## 2022-07-25 NOTE — Assessment & Plan Note (Signed)
Recent hospitalization Reviewed hospital notes, labs, imaging.   Improved and appears very well today.  Repeat CBC and BMP pending. Follow up with Urology and GI.

## 2022-07-25 NOTE — Assessment & Plan Note (Signed)
Unclear if oxybutynin contributed to symptoms and hyponatremia, but likely.  Remain off oxybutynin. Follow up with urology.

## 2022-07-25 NOTE — Assessment & Plan Note (Signed)
Appointment with Urology is scheduled!

## 2022-07-25 NOTE — Assessment & Plan Note (Signed)
Recent EGD with stretching per GI. Improved.  Reviewed hospital notes.

## 2022-07-25 NOTE — Assessment & Plan Note (Signed)
Likely secondary to recurrent cystitis plus addition of oxybutynin.  Remain off oxybutynin. Follow up with Urology.

## 2022-07-25 NOTE — Assessment & Plan Note (Signed)
Above goal since HCTZ was discontinued.   Increase losartan 100 mg daily. Remain off HCTZ.   She will have BP checked at Urology office.  She will also keep monitoring at home.

## 2022-07-25 NOTE — Progress Notes (Signed)
Subjective:    Patient ID: Catherine Tate, female    DOB: Nov 18, 1940, 82 y.o.   MRN: [id number]  HPI  Catherine Tate is a very pleasant 82 y.o. female with a history of hypertension, type 2 diabetes, recurrent UTI, urinary incontinence, breast caner who presents today for hospital follow up. Her niece joins [place] today.  She originally presented to our office on 07/16/22 for urinary frequency, nausea, decreased appetite, diarrhea, increased fatigue, weakness. Labs that day revealed moderate hyponatremia and potential cystitis. C-diff panel ordered. Oxybutynin was increased given ongoing incontinence that was suspected to be contributing to recurrent cystitis.   She presented to [facility] ED on 07/17/22 for abdominal pain, watery diarrhea, nausea. She was admitted for ongoing hyponatremia and further evaluation.  During her hospital stay she was treated with IV fluids. Urine and serum osmolality were low so she was restricted to a 100 ml/day fluid intake. Magnesium and potassium were low and replaced. GI consulted for esophageal dysphagia. She underwent EGD with dilation due to potential esophageal stricture. She underwent CT abdomen/pelvis which revealed left hydronephrosis. Urology consulted who recommended insertion of Foley Catheter x 7 days.   She was discharged home on 07/20/22 with recommendations for Urology, GI, and PCP follow up.   Since her discharge home she's had her catheter removed. Her swallowing is better and she is no longer choking. She has an appointment is scheduled with Urology in early May.  She is voiding well. She will set up a visit with GI.   She denies nausea and vomiting. She is checking her BP at home which is running 160's-170's/80's. She is complaint to her losartan 50 mg, HCTZ was discontinued during her hospital stay.  BP Readings from Last 3 Encounters:  07/25/22 (!) 162/80  07/20/22 127/66  07/16/22 138/68     Review of Systems  Constitutional:  Negative for  chills, fatigue and fever.  Respiratory:  Negative for shortness of breath.   Cardiovascular:  Negative for chest pain.  Gastrointestinal:  Negative for abdominal pain.  Genitourinary:  Negative for difficulty urinating, dysuria and hematuria.  Neurological:  Negative for dizziness.         Past Medical History:  Diagnosis Date   Arthritis    Cancer 03/13/2016   INVASIVE MAMMARY CARCINOMA   Cataract 02/03/2018   Cellulitis of left lower extremity 09/23/2021   Chest wall abscess 05/15/2017   Chickenpox    Complication of anesthesia    Frequent UTI    GERD (gastroesophageal reflux disease)    RARE   Hypertension    Personal history of chemotherapy 2017/2018   F/U left breast cancer   Personal history of radiation therapy 2018   F/U left breast cancer   PONV (postoperative nausea and vomiting)    RSD (reflex sympathetic dystrophy)    Seasonal allergies    Skin cancer    Urine incontinence     Social History   Socioeconomic History   Marital status: Single    Spouse name: Not on file   Number of children: Not on file   Years of education: Not on file   Highest education level: Not on file  Occupational History   Occupation: retired  Tobacco Use   Smoking status: Former    Packs/day: 1.00    Years: 20.00    Additional pack years: 0.00    Total pack years: 20.00    Types: Cigarettes    Quit date: 04/14/1994    Years since quitting:  28.2   Smokeless tobacco: Never  Vaping Use   Vaping Use: Never used  Substance and Sexual Activity   Alcohol use: No    Alcohol/week: 0.0 standard drinks of alcohol   Drug use: No   Sexual activity: Not Currently  Other Topics Concern   Not on file  Social History Narrative   Single.   2 children, 1 grandchild.   Retired. Once worked in a Statistician.   Enjoys making jewelry, puzzle books, sewing, traveling.   Son lives with her   Social Determinants of Health   Financial Resource Strain: Low Risk  (08/14/2021)    Overall Financial Resource Strain (CARDIA)    Difficulty of Paying Living Expenses: Not hard at all  Food Insecurity: No Food Insecurity (07/22/2022)   Hunger Vital Sign    Worried About Running Out of Food in the Last Year: Never true    Ran Out of Food in the Last Year: Never true  Transportation Needs: No Transportation Needs (07/22/2022)   PRAPARE - Administrator, Civil Service (Medical): No    Lack of Transportation (Non-Medical): No  Physical Activity: Insufficiently Active (08/14/2021)   Exercise Vital Sign    Days of Exercise per Week: 4 days    Minutes of Exercise per Session: 30 min  Stress: No Stress Concern Present (08/14/2021)   [employer] of Occupational Health - Occupational Stress Questionnaire    Feeling of Stress : Not at all  Social Connections: Moderately Integrated (08/14/2021)   Social Connection and Isolation Panel [NHANES]    Frequency of Communication with Friends and Family: More than three times a week    Frequency of Social Gatherings with Friends and Family: More than three times a week    Attends Religious Services: More than 4 times per year    Active Member of Clubs or Organizations: Yes    Attends Banker Meetings: More than 4 times per year    Marital Status: Never married  Intimate Partner Violence: Not At Risk (07/20/2022)   Humiliation, Afraid, Rape, and Kick questionnaire    Fear of Current or Ex-Partner: No    Emotionally Abused: No    Physically Abused: No    Sexually Abused: No    Past Surgical History:  Procedure Laterality Date   ABDOMINAL HYSTERECTOMY  2005   total/ Dr Barnabas Lister   BLADDER TUMOR EXCISION  2006   BREAST BIOPSY Left 03/13/2016   INVASIVE MAMMARY CARCINOMA   BREAST LUMPECTOMY Left 2018   BREAST LUMPECTOMY WITH SENTINEL LYMPH NODE BIOPSY Left 07/14/2016   Procedure: BREAST LUMPECTOMY WITH SENTINEL LYMPH NODE BX;  Surgeon: Earline Mayotte, MD;  Location: [facility] ORS;  Service: General;  Laterality:  Left;   COLONOSCOPY WITH PROPOFOL N/A 01/02/2021   Procedure: COLONOSCOPY WITH PROPOFOL;  Surgeon: Earline Mayotte, MD;  Location: [facility] ENDOSCOPY;  Service: Endoscopy;  Laterality: N/A;   denture surgery     ESOPHAGOGASTRODUODENOSCOPY (EGD) WITH PROPOFOL N/A 07/19/2022   Procedure: ESOPHAGOGASTRODUODENOSCOPY (EGD) WITH PROPOFOL;  Surgeon: Toledo, Boykin Nearing, MD;  Location: [facility] ENDOSCOPY;  Service: Gastroenterology;  Laterality: N/A;   OOPHORECTOMY     PORTACATH PLACEMENT N/A 04/01/2016   Procedure: INSERTION PORT-A-CATH;  Surgeon: Earline Mayotte, MD;  Location: [facility] ORS;  Service: General;  Laterality: N/A;   SKIN CANCER EXCISION Right 2015   foot/ Dr Margo Aye   TUBAL LIGATION     TUMOR EXCISION Right    FOOT    Family History  Problem Relation Age of Onset   Heart disease Mother    Transient ischemic attack Mother    Emphysema Father    Breast cancer Sister    Breast cancer Cousin        paternal   Kidney cancer Neg Hx    Bladder Cancer Neg Hx     Allergies  Allergen Reactions   Bee Pollen Hives   Mosquito (Diagnostic) Hives   Other Other (See Comments)    Uncoded Allergy. Allergen: VICRYL  (90% glycolide and 10% L-lactide [polyglactin-- synthetic absorbable sterile surgical suture]), Other Reaction: Infection   Gabapentin Swelling   Mosquito (Culex Pipiens) Allergy Skin Test Hives   Norvasc [Amlodipine Besylate] Swelling    TO FEET   Lisinopril Cough    Current Outpatient Medications on File Prior to Visit  Medication Sig Dispense Refill   acetaminophen (TYLENOL) 500 MG tablet Take 1,000 mg by mouth every 6 (six) hours as needed for moderate pain.      alendronate (FOSAMAX) 70 MG tablet TAKE ONE TABLET (70 MG TOTAL) BY MOUTH ONCE A WEEK. TAKE WITH A FULL GLASS OF WATER ON AN EMPTY STOMACH 12 tablet 3   Ascorbic Acid (VITAMIN C) 1000 MG tablet Take 1,000 mg by mouth daily.     atorvastatin (LIPITOR) 10 MG tablet TAKE ONE TABLET BY MOUTH ONCE A DAY FOR CHOLSTEROL. 90  tablet 2   Calcium Carb-Cholecalciferol (CALCIUM/VITAMIN D PO) Take 1,200 mg by mouth.     CRANBERRY PO Take 1 capsule by mouth 2 (two) times daily.     fluticasone (FLONASE) 50 MCG/ACT nasal spray Place 2 sprays into both nostrils daily. 16 g 6   ondansetron (ZOFRAN-ODT) 4 MG disintegrating tablet Take 1 tablet (4 mg total) by mouth every 8 (eight) hours as needed for nausea or vomiting. 20 tablet 0   Probiotic Product (PROBIOTIC PO) Take 1 tablet by mouth daily.     cetirizine (ZYRTEC) 10 MG tablet Take 1 tablet (10 mg total) by mouth daily. (Patient not taking: Reported on 07/17/2022) 30 tablet 11   No current facility-administered medications on file prior to visit.    BP (!) 162/80   Pulse 100   Temp 98.1 F (36.7 C) (Temporal)   Ht  (1.651 m)   Wt 158 lb (71.7 kg)   SpO2 95%   BMI 26.29 kg/m  Objective:   Physical Exam Cardiovascular:     Rate and Rhythm: Normal rate and regular rhythm.  Pulmonary:     Effort: Pulmonary effort is normal.     Breath sounds: Normal breath sounds.  Abdominal:     Palpations: Abdomen is soft.     Tenderness: There is no abdominal tenderness.  Musculoskeletal:     Cervical back: Neck supple.  Skin:    General: Skin is warm and dry.  Neurological:     Mental Status: She is alert and oriented to person, place, and time.  Psychiatric:        Mood and Affect: Mood normal.           Assessment & Plan:  Essential hypertension Assessment & Plan: Above goal since HCTZ was discontinued.   Increase losartan 100 mg daily. Remain off HCTZ.   She will have BP checked at Urology office.  She will also keep monitoring at home.  Orders: -     Losartan Potassium; Take 1 tablet (100 mg total) by mouth daily. for blood pressure.  Dispense: 90 tablet; Refill: 0  Hyponatremia  Assessment & Plan: Recent hospitalization Reviewed hospital notes, labs, imaging.   Improved and appears very well today.  Repeat CBC and BMP pending. Follow up  with Urology and GI.  Orders: -     CBC -     Basic metabolic panel  Presbyesophagus Assessment & Plan: Recent EGD with stretching per GI. Improved.  Reviewed hospital notes.   Acute urinary retention Assessment & Plan: Resolved.  Follow up with Urology as scheduled.    Hydronephrosis of left kidney Assessment & Plan: Likely secondary to recurrent cystitis plus addition of oxybutynin.  Remain off oxybutynin. Follow up with Urology.   Recurrent UTI Assessment & Plan: Appointment with Urology is scheduled!   Urinary incontinence, mixed Assessment & Plan: Unclear if oxybutynin contributed to symptoms and hyponatremia, but likely.  Remain off oxybutynin. Follow up with urology.         Doreene Nest, NP

## 2022-07-26 LAB — CBC
HCT: 35.5 % (ref 35.0–45.0)
MCH: 31.7 pg (ref 27.0–33.0)
MCHC: 34.6 g/dL (ref 32.0–36.0)
MPV: 9.9 fL (ref 7.5–12.5)
Platelets: 251 10*3/uL (ref 140–400)
RBC: 3.88 10*6/uL (ref 3.80–5.10)
RDW: 12.2 % (ref 11.0–15.0)

## 2022-07-26 LAB — BASIC METABOLIC PANEL
BUN/Creatinine Ratio: 11 (calc) (ref 6–22)
BUN: 11 mg/dL (ref 7–25)
CO2: 26 mmol/L (ref 20–32)
Calcium: 9.6 mg/dL (ref 8.6–10.4)
Chloride: 95 mmol/L — ABNORMAL LOW (ref 98–110)
Creat: 0.97 mg/dL — ABNORMAL HIGH (ref 0.60–0.95)
Glucose, Bld: 111 mg/dL — ABNORMAL HIGH (ref 65–99)
Potassium: 4.5 mmol/L (ref 3.5–5.3)
Sodium: 129 mmol/L — ABNORMAL LOW (ref 135–146)

## 2022-08-07 ENCOUNTER — Telehealth: Payer: Self-pay | Admitting: Primary Care

## 2022-08-07 DIAGNOSIS — M810 Age-related osteoporosis without current pathological fracture: Secondary | ICD-10-CM

## 2022-08-07 DIAGNOSIS — N390 Urinary tract infection, site not specified: Secondary | ICD-10-CM

## 2022-08-07 DIAGNOSIS — N3946 Mixed incontinence: Secondary | ICD-10-CM

## 2022-08-07 NOTE — Telephone Encounter (Signed)
DME ordered for Walker placed in Epic.  Can you notify the pools for this?

## 2022-08-07 NOTE — Telephone Encounter (Signed)
Community message has been sent. Called and notified patient.

## 2022-08-07 NOTE — Telephone Encounter (Signed)
Patient would like to know If she can have a rx for a small walker ,she said that she walks alright but she still gets a little nervous while walking down the long hall at her daughters house and doesn't have anything to hold on to. She had a fall after she got out of the hospital on her Earvin Hansen has cause d her to be nervous now.  [facility] in [place] is the closest medical supply store near her  Patient would like a phone call if rx is able to be sent out for the walker.   [phone]

## 2022-08-08 DIAGNOSIS — M1990 Unspecified osteoarthritis, unspecified site: Secondary | ICD-10-CM | POA: Diagnosis not present

## 2022-08-13 ENCOUNTER — Ambulatory Visit: Payer: PPO | Admitting: Urology

## 2022-08-13 ENCOUNTER — Other Ambulatory Visit: Payer: Self-pay | Admitting: Medical Oncology

## 2022-08-13 ENCOUNTER — Ambulatory Visit: Payer: PPO | Admitting: Physician Assistant

## 2022-08-13 ENCOUNTER — Other Ambulatory Visit: Payer: Self-pay

## 2022-08-13 DIAGNOSIS — Z780 Asymptomatic menopausal state: Secondary | ICD-10-CM

## 2022-08-13 DIAGNOSIS — C50412 Malignant neoplasm of upper-outer quadrant of left female breast: Secondary | ICD-10-CM

## 2022-08-13 DIAGNOSIS — Z79811 Long term (current) use of aromatase inhibitors: Secondary | ICD-10-CM

## 2022-08-14 ENCOUNTER — Telehealth: Payer: Self-pay

## 2022-08-14 NOTE — Telephone Encounter (Signed)
PA request received via CMM for Ondansetron 4MG  dispersible tablets  PA has been submitted via fax to [employer] through [facility] and is pending additional questions/determination  Key: [id number]

## 2022-08-18 ENCOUNTER — Ambulatory Visit (INDEPENDENT_AMBULATORY_CARE_PROVIDER_SITE_OTHER): Payer: PPO

## 2022-08-18 VITALS — Ht 67.0 in | Wt 158.0 lb

## 2022-08-18 DIAGNOSIS — Z0000 Encounter for general adult medical examination without abnormal findings: Secondary | ICD-10-CM

## 2022-08-18 NOTE — Patient Instructions (Signed)
[name] , Thank you for taking time to come for your Medicare Wellness Visit. I appreciate your ongoing commitment to your health goals. Please review the following plan we discussed and let me know if I can assist you in the future.   These are the goals we discussed:  Goals      Increase water intake     Starting 02/03/2018, I will attempt to drink at least 6-8 glasses of water daily.      Patient Stated     02/09/2019, I will maintain and continue medications as prescribed.      Patient Stated     08/14/2021 - I will continue to ride my exercise bike 3 days a week for 30 minutes.     Patient Stated     No new goals        This is a list of the screening recommended for you and due dates:  Health Maintenance  Topic Date Due   Yearly kidney health urinalysis for diabetes  Never done   Eye exam for diabetics  08/01/2020   COVID-19 Vaccine (4 - 2023-24 season) 12/13/2021   Flu Shot  11/13/2022   Complete foot exam   12/04/2022   Hemoglobin A1C  12/09/2022   Yearly kidney function blood test for diabetes  07/25/2023   Medicare Annual Wellness Visit  08/18/2023   Pneumonia Vaccine  Completed   DEXA scan (bone density measurement)  Completed   HPV Vaccine  Aged Out   DTaP/Tdap/Td vaccine  Discontinued   Zoster (Shingles) Vaccine  Discontinued    Advanced directives: on chart   Conditions/risks identified: Aim for 30 minutes of exercise or brisk walking, 6-8 glasses of water, and 5 servings of fruits and vegetables each day.   Next appointment: Follow up in one year for your annual wellness visit 08/19/23 @ 8:45 televisit   Preventive Care 65 Years and Older, Female Preventive care refers to lifestyle choices and visits with your health care provider that can promote health and wellness. What does preventive care include? A yearly physical exam. This is also called an annual well check. Dental exams once or twice a year. Routine eye exams. Ask your health care provider  how often you should have your eyes checked. Personal lifestyle choices, including: Daily care of your teeth and gums. Regular physical activity. Eating a healthy diet. Avoiding tobacco and drug use. Limiting alcohol use. Practicing safe sex. Taking low-dose aspirin every day. Taking vitamin and mineral supplements as recommended by your health care provider. What happens during an annual well check? The services and screenings done by your health care provider during your annual well check will depend on your age, overall health, lifestyle risk factors, and family history of disease. Counseling  Your health care provider may ask you questions about your: Alcohol use. Tobacco use. Drug use. Emotional well-being. Home and relationship well-being. Sexual activity. Eating habits. History of falls. Memory and ability to understand (cognition). Work and work Astronomer. Reproductive health. Screening  You may have the following tests or measurements: Height, weight, and BMI. Blood pressure. Lipid and cholesterol levels. These may be checked every 5 years, or more frequently if you are over 92 years old. Skin check. Lung cancer screening. You may have this screening every year starting at age 99 if you have a 30-pack-year history of smoking and currently smoke or have quit within the past 15 years. Fecal occult blood test (FOBT) of the stool. You may have this test  every year starting at age 72. Flexible sigmoidoscopy or colonoscopy. You may have a sigmoidoscopy every 5 years or a colonoscopy every 10 years starting at age 52. Hepatitis C blood test. Hepatitis B blood test. Sexually transmitted disease (STD) testing. Diabetes screening. This is done by checking your blood sugar (glucose) after you have not eaten for a while (fasting). You may have this done every 1-3 years. Bone density scan. This is done to screen for osteoporosis. You may have this done starting at age  29. Mammogram. This may be done every 1-2 years. Talk to your health care provider about how often you should have regular mammograms. Talk with your health care provider about your test results, treatment options, and if necessary, the need for more tests. Vaccines  Your health care provider may recommend certain vaccines, such as: Influenza vaccine. This is recommended every year. Tetanus, diphtheria, and acellular pertussis (Tdap, Td) vaccine. You may need a Td booster every 10 years. Zoster vaccine. You may need this after age 43. Pneumococcal 13-valent conjugate (PCV13) vaccine. One dose is recommended after age 72. Pneumococcal polysaccharide (PPSV23) vaccine. One dose is recommended after age 70. Talk to your health care provider about which screenings and vaccines you need and how often you need them. This information is not intended to replace advice given to you by your health care provider. Make sure you discuss any questions you have with your health care provider. Document Released: 04/27/2015 Document Revised: 12/19/2015 Document Reviewed: 01/30/2015 Elsevier Interactive Patient Education  2017 [employer].  Fall Prevention in the Home Falls can cause injuries. They can happen to people of all ages. There are many things you can do to make your home safe and to help prevent falls. What can I do on the outside of my home? Regularly fix the edges of walkways and driveways and fix any cracks. Remove anything that might make you trip as you walk through a door, such as a raised step or threshold. Trim any bushes or trees on the path to your home. Use bright outdoor lighting. Clear any walking paths of anything that might make someone trip, such as rocks or tools. Regularly check to see if handrails are loose or broken. Make sure that both sides of any steps have handrails. Any raised decks and porches should have guardrails on the edges. Have any leaves, snow, or ice cleared  regularly. Use sand or salt on walking paths during winter. Clean up any spills in your garage right away. This includes oil or grease spills. What can I do in the bathroom? Use night lights. Install grab bars by the toilet and in the tub and shower. Do not use towel bars as grab bars. Use non-skid mats or decals in the tub or shower. If you need to sit down in the shower, use a plastic, non-slip stool. Keep the floor dry. Clean up any water that spills on the floor as soon as it happens. Remove soap buildup in the tub or shower regularly. Attach bath mats securely with double-sided non-slip rug tape. Do not have throw rugs and other things on the floor that can make you trip. What can I do in the bedroom? Use night lights. Make sure that you have a light by your bed that is easy to reach. Do not use any sheets or blankets that are too big for your bed. They should not hang down onto the floor. Have a firm chair that has side arms. You can use  this for support while you get dressed. Do not have throw rugs and other things on the floor that can make you trip. What can I do in the kitchen? Clean up any spills right away. Avoid walking on wet floors. Keep items that you use a lot in easy-to-reach places. If you need to reach something above you, use a strong step stool that has a grab bar. Keep electrical cords out of the way. Do not use floor polish or wax that makes floors slippery. If you must use wax, use non-skid floor wax. Do not have throw rugs and other things on the floor that can make you trip. What can I do with my stairs? Do not leave any items on the stairs. Make sure that there are handrails on both sides of the stairs and use them. Fix handrails that are broken or loose. Make sure that handrails are as long as the stairways. Check any carpeting to make sure that it is firmly attached to the stairs. Fix any carpet that is loose or worn. Avoid having throw rugs at the top or  bottom of the stairs. If you do have throw rugs, attach them to the floor with carpet tape. Make sure that you have a light switch at the top of the stairs and the bottom of the stairs. If you do not have them, ask someone to add them for you. What else can I do to help prevent falls? Wear shoes that: Do not have high heels. Have rubber bottoms. Are comfortable and fit you well. Are closed at the toe. Do not wear sandals. If you use a stepladder: Make sure that it is fully opened. Do not climb a closed stepladder. Make sure that both sides of the stepladder are locked into place. Ask someone to hold it for you, if possible. Clearly mark and make sure that you can see: Any grab bars or handrails. First and last steps. Where the edge of each step is. Use tools that help you move around (mobility aids) if they are needed. These include: Canes. Walkers. Scooters. Crutches. Turn on the lights when you go into a dark area. Replace any light bulbs as soon as they burn out. Set up your furniture so you have a clear path. Avoid moving your furniture around. If any of your floors are uneven, fix them. If there are any pets around you, be aware of where they are. Review your medicines with your doctor. Some medicines can make you feel dizzy. This can increase your chance of falling. Ask your doctor what other things that you can do to help prevent falls. This information is not intended to replace advice given to you by your health care provider. Make sure you discuss any questions you have with your health care provider. Document Released: 01/25/2009 Document Revised: 09/06/2015 Document Reviewed: 05/05/2014 Elsevier Interactive Patient Education  2017 [employer].

## 2022-08-18 NOTE — Progress Notes (Signed)
I connected with  Tameca Freemon Dente on 08/18/22 by a audio enabled telemedicine application and verified that I am speaking with the correct person using two identifiers.  Patient Location: Home  Provider Location: Home Office  I discussed the limitations of evaluation and management by telemedicine. The patient expressed understanding and agreed to proceed.  Subjective:   Catherine Tate is a 82 y.o. female who presents for Medicare Annual (Subsequent) preventive examination.  Review of Systems      Cardiac Risk Factors include: advanced age (>6men, >56 women);hypertension;sedentary lifestyle     Objective:    Today's Vitals   08/18/22 0827  Weight: 158 lb (71.7 kg)  Height: 5\' 7"  (1.702 m)   Body mass index is 24.75 kg/m.     08/18/2022    8:37 AM 07/19/2022   10:00 AM 11/27/2021   10:43 AM 08/14/2021    9:23 AM 07/30/2021   10:05 AM 01/28/2021   10:08 AM 01/02/2021    8:32 AM  Advanced Directives  Does Patient Have a Medical Advance Directive? Yes No Yes Yes Yes Yes Yes  Type of Estate agent of [place];Living will  Healthcare Power of [place];Living will Healthcare Power of [place];Living will Living will;Healthcare Power of [employer] Power of [place];Living will Living will  Does patient want to make changes to medical advance directive? No - Patient declined        Copy of Healthcare Power of Attorney in Chart? Yes - validated most recent copy scanned in chart (See row information)  Yes - validated most recent copy scanned in chart (See row information) Yes - validated most recent copy scanned in chart (See row information)     Would patient like information on creating a medical advance directive?  No - Patient declined         Current Medications (verified) Outpatient Encounter Medications as of 08/18/2022  Medication Sig   acetaminophen (TYLENOL) 500 MG tablet Take 1,000 mg by mouth every 6 (six) hours as needed for moderate pain.     alendronate (FOSAMAX) 70 MG tablet TAKE ONE TABLET (70 MG TOTAL) BY MOUTH ONCE A WEEK. TAKE WITH A FULL GLASS OF WATER ON AN EMPTY STOMACH   Ascorbic Acid (VITAMIN C) 1000 MG tablet Take 1,000 mg by mouth daily.   atorvastatin (LIPITOR) 10 MG tablet TAKE ONE TABLET BY MOUTH ONCE A DAY FOR CHOLSTEROL.   Calcium Carb-Cholecalciferol (CALCIUM/VITAMIN D PO) Take 1,200 mg by mouth.   cetirizine (ZYRTEC) 10 MG tablet Take 1 tablet (10 mg total) by mouth daily.   CRANBERRY PO Take 1 capsule by mouth 2 (two) times daily.   fluticasone (FLONASE) 50 MCG/ACT nasal spray Place 2 sprays into both nostrils daily.   losartan (COZAAR) 100 MG tablet Take 1 tablet (100 mg total) by mouth daily. for blood pressure.   Probiotic Product (PROBIOTIC PO) Take 1 tablet by mouth daily.   ondansetron (ZOFRAN-ODT) 4 MG disintegrating tablet Take 1 tablet (4 mg total) by mouth every 8 (eight) hours as needed for nausea or vomiting. (Patient not taking: Reported on 08/18/2022)   No facility-administered encounter medications on file as of 08/18/2022.    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   Cataract 02/03/2018   Cellulitis of left lower extremity 09/23/2021  Chest wall abscess 05/15/2017   Chickenpox    Complication of anesthesia    Frequent UTI    GERD (gastroesophageal reflux disease)    RARE   Hypertension    Personal history of chemotherapy 2017/2018   F/U left breast cancer   Personal history of radiation therapy 2018   F/U left breast cancer   PONV (postoperative nausea and vomiting)    RSD (reflex sympathetic dystrophy)    Seasonal allergies    Skin cancer    Urine incontinence    Past Surgical History:  Procedure Laterality Date   ABDOMINAL HYSTERECTOMY   2005   total/ Dr Barnabas Lister   BLADDER TUMOR EXCISION  2006   BREAST BIOPSY Left 03/13/2016   INVASIVE MAMMARY CARCINOMA   BREAST LUMPECTOMY Left 2018   BREAST LUMPECTOMY WITH SENTINEL LYMPH NODE BIOPSY Left 07/14/2016   Procedure: BREAST LUMPECTOMY WITH SENTINEL LYMPH NODE BX;  Surgeon: Earline Mayotte, MD;  Location: [facility];  Service: General;  Laterality: Left;   COLONOSCOPY WITH PROPOFOL N/A 01/02/2021   Procedure: COLONOSCOPY WITH PROPOFOL;  Surgeon: Earline Mayotte, MD;  Location: [facility];  Service: Endoscopy;  Laterality: N/A;   denture surgery     ESOPHAGOGASTRODUODENOSCOPY (EGD) WITH PROPOFOL N/A 07/19/2022   Procedure: ESOPHAGOGASTRODUODENOSCOPY (EGD) WITH PROPOFOL;  Surgeon: Toledo, Boykin Nearing, MD;  Location: [facility];  Service: Gastroenterology;  Laterality: N/A;   OOPHORECTOMY     PORTACATH PLACEMENT N/A 04/01/2016   Procedure: INSERTION PORT-A-CATH;  Surgeon: Earline Mayotte, MD;  Location: [facility];  Service: General;  Laterality: N/A;   SKIN CANCER EXCISION Right 2015   foot/ Dr Margo Aye   TUBAL LIGATION     TUMOR EXCISION Right    FOOT   Family History  Problem Relation Age of Onset   Heart disease Mother    Transient ischemic attack Mother    Emphysema Father    Breast cancer Sister    Breast cancer Cousin        paternal   Kidney cancer Neg Hx    Bladder Cancer Neg Hx    Social History   Socioeconomic History   Marital status: Single    Spouse name: Not on file   Number of children: Not on file   Years of education: Not on file   Highest education level: Not on file  Occupational History   Occupation: retired  Tobacco Use   Smoking status: Former    Packs/day: 1.00    Years: 20.00    Additional pack years: 0.00    Total pack years: 20.00    Types: Cigarettes    Quit date: 04/14/1994    Years since quitting: 28.3   Smokeless tobacco: Never  Vaping Use   Vaping Use: Never used  Substance and Sexual Activity   Alcohol use: No     Alcohol/week: 0.0 standard drinks of alcohol   Drug use: No   Sexual activity: Not Currently  Other Topics Concern   Not on file  Social History Narrative   Single.   2 children, 1 grandchild.   Retired. Once worked in a Statistician.   Enjoys making jewelry, puzzle books, sewing, traveling.   Son lives with her   Social Determinants of Health   Financial Resource Strain: Low Risk  (08/18/2022)   Overall Financial Resource Strain (CARDIA)    Difficulty of Paying Living Expenses: Not hard at all  Food Insecurity: No Food Insecurity (08/18/2022)   Hunger Vital Sign  Worried About Programme researcher, broadcasting/film/video in the Last Year: Never true    Ran Out of Food in the Last Year: Never true  Transportation Needs: No Transportation Needs (08/18/2022)   PRAPARE - Administrator, Civil Service (Medical): No    Lack of Transportation (Non-Medical): No  Physical Activity: Inactive (08/18/2022)   Exercise Vital Sign    Days of Exercise per Week: 0 days    Minutes of Exercise per Session: 0 min  Stress: No Stress Concern Present (08/18/2022)   [employer] of Occupational Health - Occupational Stress Questionnaire    Feeling of Stress : Not at all  Social Connections: Moderately Isolated (08/18/2022)   Social Connection and Isolation Panel [NHANES]    Frequency of Communication with Friends and Family: More than three times a week    Frequency of Social Gatherings with Friends and Family: More than three times a week    Attends Religious Services: More than 4 times per year    Active Member of [employer] or Organizations: No    Attends Engineer, structural: Never    Marital Status: Never married    Tobacco Counseling Counseling given: Not Answered   Clinical Intake:  Pre-visit preparation completed: Yes  Pain : No/denies pain     Nutritional Risks: None Diabetes: No  How often do you need to have someone help you when you read instructions, pamphlets, or other written  materials from your doctor or pharmacy?: 1 - Never  Diabetic? no  Interpreter Needed?: No  Information entered by :: C.Domenik Trice LPN   Activities of Daily Living    08/18/2022    8:39 AM 07/19/2022    8:29 PM  In your present state of health, do you have any difficulty performing the following activities:  Hearing? 0 0  Vision? 1 0  Comment wears bifocals   Difficulty concentrating or making decisions? 0 0  Walking or climbing stairs? 0 0  Dressing or bathing? 0 0  Doing errands, shopping? 0 0  Preparing Food and eating ? N   Using the Toilet? N   In the past six months, have you accidently leaked urine? Y   Comment Wears pads   Do you have problems with loss of bowel control? N   Managing your Medications? N   Managing your Finances? N   Housekeeping or managing your Housekeeping? N     Patient Care Team: Doreene Nest, NP as PCP - General (Internal Medicine) Lemar Livings, Merrily Pew, MD (General Surgery) Dianne Dun, MD (Inactive) as Consulting Physician (Family Medicine) Jeralyn Ruths, MD as Consulting Physician (Oncology)  Indicate any recent Medical Services you may have received from other than [facility] providers in the past year (date may be approximate).     Assessment:   This is a routine wellness examination for [place].  Hearing/Vision screen Hearing Screening - Comments:: No aids Vision Screening - Comments:: Glasses - [facility]  Dietary issues and exercise activities discussed: Current Exercise Habits: The patient does not participate in regular exercise at present, Exercise limited by: None identified   Goals Addressed             This Visit's Progress    Patient Stated       No new goals       Depression Screen    08/18/2022    8:36 AM 07/16/2022   12:28 PM 06/18/2022   11:48 AM 06/10/2022    8:24 AM 08/14/2021  9:22 AM 06/06/2021   11:01 AM 02/17/2020   10:35 AM  PHQ 2/9 Scores  PHQ - 2 Score 0 0 0 0 0 0 0  PHQ- 9 Score 0 3 5   6  0     Fall Risk    08/18/2022    8:38 AM 07/16/2022   12:28 PM 07/01/2022    2:52 PM 06/18/2022   11:48 AM 06/10/2022    8:24 AM  Fall Risk   Falls in the past year? 0 0 1 0 0  Number falls in past yr: 1 0 0  0  Comment slipped on wet floor      Injury with Fall? 0 0 0  0  Risk for fall due to : Impaired vision No Fall Risks Impaired balance/gait  No Fall Risks  Follow up Falls prevention discussed;Falls evaluation completed;Education provided Falls evaluation completed Falls evaluation completed  Falls evaluation completed    FALL RISK PREVENTION PERTAINING TO THE HOME:  Any stairs in or around the home? Yes  If so, are there any without handrails? No  Home free of loose throw rugs in walkways, pet beds, electrical cords, etc? Yes  Adequate lighting in your home to reduce risk of falls? Yes   ASSISTIVE DEVICES UTILIZED TO PREVENT FALLS:  Life alert? No  Use of a cane, walker or w/c? Yes  Grab bars in the bathroom? Yes  Shower chair or bench in shower? Yes  Elevated toilet seat or a handicapped toilet? Yes    Cognitive Function:    02/17/2020   10:37 AM 02/09/2019    9:52 AM 02/03/2018    8:47 AM 02/02/2017   10:05 AM  MMSE - Mini Mental State Exam  Orientation to time 5 5 5 5   Orientation to Place 5 5 5 5   Registration 3 3 3 3   Attention/ Calculation 5 5 0 0  Recall 3 3 3 2   Recall-comments    unable to recall 1 of 3 words  Language- name 2 objects   0 0  Language- repeat 1 1 1 1   Language- follow 3 step command   3 2  Language- follow 3 step command-comments    unable to follow 1 step of 3 step command  Language- read & follow direction   0 0  Write a sentence   0 0  Copy design   0 0  Total score   20 18        08/18/2022    8:40 AM 08/14/2021    9:24 AM  6CIT Screen  What Year? 0 points 0 points  What month? 0 points 0 points  What time? 0 points 0 points  Count back from 20 0 points 0 points  Months in reverse 0 points 0 points  Repeat phrase 2 points 2  points  Total Score 2 points 2 points    Immunizations Immunization History  Administered Date(s) Administered   Fluad Quad(high Dose 65+) 12/24/2018, 12/27/2020   Influenza,inj,Quad PF,6+ Mos 03/03/2016, 02/02/2017, 01/11/2018   Influenza-Unspecified 01/12/2020, 12/13/2021   PFIZER(Purple Top)SARS-COV-2 Vaccination 06/07/2019, 06/28/2019, 01/25/2020   Pneumococcal Conjugate-13 11/05/2015   Pneumococcal Polysaccharide-23 02/09/2017   Tdap 09/10/2010   Zoster, Live 04/15/1999    TDAP status: Due, Education has been provided regarding the importance of this vaccine. Advised may receive this vaccine at local pharmacy or Health Dept. Aware to provide a copy of the vaccination record if obtained from local pharmacy or Health Dept. Verbalized acceptance and understanding.  Flu Vaccine status: Up to date  Pneumococcal vaccine status: Up to date  Covid-19 vaccine status: Declined, Education has been provided regarding the importance of this vaccine but patient still declined. Advised may receive this vaccine at local pharmacy or Health Dept.or vaccine clinic. Aware to provide a copy of the vaccination record if obtained from local pharmacy or Health Dept. Verbalized acceptance and understanding.  Qualifies for Shingles Vaccine? Yes   Zostavax completed Yes   Shingrix Completed?: No.    Education has been provided regarding the importance of this vaccine. Patient has been advised to call insurance company to determine out of pocket expense if they have not yet received this vaccine. Advised may also receive vaccine at local pharmacy or Health Dept. Verbalized acceptance and understanding.  Screening Tests Health Maintenance  Topic Date Due   Diabetic kidney evaluation - Urine ACR  Never done   OPHTHALMOLOGY EXAM  08/01/2020   COVID-19 Vaccine (4 - 2023-24 season) 12/13/2021   INFLUENZA VACCINE  11/13/2022   FOOT EXAM  12/04/2022   HEMOGLOBIN A1C  12/09/2022   Diabetic kidney evaluation -  eGFR measurement  07/25/2023   Medicare Annual Wellness (AWV)  08/18/2023   Pneumonia Vaccine 45+ Years old  Completed   DEXA SCAN  Completed   HPV VACCINES  Aged Out   DTaP/Tdap/Td  Discontinued   Zoster Vaccines- Shingrix  Discontinued    Health Maintenance  Health Maintenance Due  Topic Date Due   Diabetic kidney evaluation - Urine ACR  Never done   OPHTHALMOLOGY EXAM  08/01/2020   COVID-19 Vaccine (4 - 2023-24 season) 12/13/2021    Colorectal cancer screening: No longer required.   Mammogram status: Completed 11/25/2021. Repeat every year  Bone Density status: Completed 11/25/2021. Results reflect: Bone density results: OSTEOPENIA. Repeat every 5 years.  Lung Cancer Screening: (Low Dose CT Chest recommended if Age 55-80 years, 30 pack-year currently smoking OR have quit w/in 15years.) does not qualify.   Lung Cancer Screening Referral: no  Additional Screening:  Hepatitis C Screening: does not qualify; Completed no  Vision Screening: Recommended annual ophthalmology exams for early detection of glaucoma and other disorders of the eye. Is the patient up to date with their annual eye exam?  Yes  Who is the provider or what is the name of the office in which the patient attends annual eye exams? [facility] If pt is not established with a provider, would they like to be referred to a provider to establish care? No .   Dental Screening: Recommended annual dental exams for proper oral hygiene  Community Resource Referral / Chronic Care Management: CRR required this visit?  No   CCM required this visit?  No      Plan:     I have personally reviewed and noted the following in the patient's chart:   Medical and social history Use of alcohol, tobacco or illicit drugs  Current medications and supplements including opioid prescriptions. Patient is not currently taking opioid prescriptions. Functional ability and status Nutritional status Physical activity Advanced  directives List of other physicians Hospitalizations, surgeries, and ER visits in previous 12 months Vitals Screenings to include cognitive, depression, and falls Referrals and appointments  In addition, I have reviewed and discussed with patient certain preventive protocols, quality metrics, and best practice recommendations. A written personalized care plan for preventive services as well as general preventive health recommendations were provided to patient.     Maryan Puls, LPN   04/22/1476  Nurse Notes: none

## 2022-09-02 NOTE — Telephone Encounter (Signed)
Medication is not covered.

## 2022-09-05 ENCOUNTER — Encounter: Payer: Self-pay | Admitting: Primary Care

## 2022-09-05 ENCOUNTER — Ambulatory Visit (INDEPENDENT_AMBULATORY_CARE_PROVIDER_SITE_OTHER): Payer: PPO | Admitting: Primary Care

## 2022-09-05 VITALS — BP 158/88 | HR 104 | Temp 97.5°F | Ht 67.0 in | Wt 154.0 lb

## 2022-09-05 DIAGNOSIS — I10 Essential (primary) hypertension: Secondary | ICD-10-CM

## 2022-09-05 DIAGNOSIS — R300 Dysuria: Secondary | ICD-10-CM | POA: Diagnosis not present

## 2022-09-05 DIAGNOSIS — M79601 Pain in right arm: Secondary | ICD-10-CM | POA: Diagnosis not present

## 2022-09-05 LAB — POC URINALSYSI DIPSTICK (AUTOMATED)
Bilirubin, UA: NEGATIVE
Blood, UA: NEGATIVE
Glucose, UA: NEGATIVE
Ketones, UA: NEGATIVE
Leukocytes, UA: NEGATIVE
Nitrite, UA: NEGATIVE
Protein, UA: NEGATIVE
Spec Grav, UA: 1.01 (ref 1.010–1.025)
Urobilinogen, UA: 0.2 E.U./dL
pH, UA: 5.5 (ref 5.0–8.0)

## 2022-09-05 NOTE — Assessment & Plan Note (Addendum)
Above goal today, improved on recheck.  Continue losartan 100 mg daily. Follow up in 2 weeks for BP check, if above goal then add amlodipine 5 mg daily

## 2022-09-05 NOTE — Progress Notes (Signed)
Subjective:    Patient ID: Catherine Tate, female    DOB: 11-Dec-1940, 82 y.o.   MRN: [id number]  Dysuria  Associated symptoms include frequency. Pertinent negatives include no chills or hematuria.  Arm Pain     Catherine Tate is a very pleasant 82 y.o. female with a history of recurrent UTI, urosepsis, type 2 diabetes, hypertension, CKD, urinary retention who presents today to discuss multiple concerns.  1) Dysuria: Acute onset one week ago with dysuria, urinary frequency. She took two at home UTI tests and one came back negative and one came back positive.   Yesterday she noticed an improvement in her symptoms. Today she denies dysuria. She continues with chronic urinary incontinence. She denies fevers, chills.   She has not seen the Urologist yet for follow up, but has one scheduled in June.   2) Upper Extremity Pain: Symptom onset 2-3 days ago with intermittent pain to the mid right anterior forearm (anatomical position). She denies recent injury or trauma, but has been lifting cases of bottled water and doing more physical activity at home. She denies radiation of pain.   She fell at home in April 2024. She rose from getting out of bed, slipped on some water that was on her bedroom floor, she fell onto her bottom. This pain resolved. She did not hit her head  BP Readings from Last 3 Encounters:  09/05/22 (!) 158/88  07/25/22 (!) 162/80  07/20/22 127/66      Review of Systems  Constitutional:  Negative for chills and fever.  Gastrointestinal:  Negative for abdominal pain.  Genitourinary:  Positive for dysuria and frequency. Negative for hematuria and pelvic pain.         Past Medical History:  Diagnosis Date   Arthritis    Cancer (HCC) 03/13/2016   INVASIVE MAMMARY CARCINOMA   Cataract 02/03/2018   Cellulitis of left lower extremity 09/23/2021   Chest wall abscess 05/15/2017   Chickenpox    Complication of anesthesia    Frequent UTI    GERD (gastroesophageal  reflux disease)    RARE   Hypertension    Personal history of chemotherapy 2017/2018   F/U left breast cancer   Personal history of radiation therapy 2018   F/U left breast cancer   PONV (postoperative nausea and vomiting)    RSD (reflex sympathetic dystrophy)    Seasonal allergies    Skin cancer    Urine incontinence     Social History   Socioeconomic History   Marital status: Single    Spouse name: Not on file   Number of children: Not on file   Years of education: Not on file   Highest education level: Not on file  Occupational History   Occupation: retired  Tobacco Use   Smoking status: Former    Packs/day: 1.00    Years: 20.00    Additional pack years: 0.00    Total pack years: 20.00    Types: Cigarettes    Quit date: 04/14/1994    Years since quitting: 28.4   Smokeless tobacco: Never  Vaping Use   Vaping Use: Never used  Substance and Sexual Activity   Alcohol use: No    Alcohol/week: 0.0 standard drinks of alcohol   Drug use: No   Sexual activity: Not Currently  Other Topics Concern   Not on file  Social History Narrative   Single.   2 children, 1 grandchild.   Retired. Once worked in a Statistician.  Enjoys making jewelry, puzzle books, sewing, traveling.   Son lives with her   Social Determinants of Health   Financial Resource Strain: Low Risk  (08/18/2022)   Overall Financial Resource Strain (CARDIA)    Difficulty of Paying Living Expenses: Not hard at all  Food Insecurity: No Food Insecurity (08/18/2022)   Hunger Vital Sign    Worried About Running Out of Food in the Last Year: Never true    Ran Out of Food in the Last Year: Never true  Transportation Needs: No Transportation Needs (08/18/2022)   PRAPARE - Administrator, Civil Service (Medical): No    Lack of Transportation (Non-Medical): No  Physical Activity: Inactive (08/18/2022)   Exercise Vital Sign    Days of Exercise per Week: 0 days    Minutes of Exercise per Session: 0 min   Stress: No Stress Concern Present (08/18/2022)   [employer] of Occupational Health - Occupational Stress Questionnaire    Feeling of Stress : Not at all  Social Connections: Moderately Isolated (08/18/2022)   Social Connection and Isolation Panel [NHANES]    Frequency of Communication with Friends and Family: More than three times a week    Frequency of Social Gatherings with Friends and Family: More than three times a week    Attends Religious Services: More than 4 times per year    Active Member of [employer] or Organizations: No    Attends Banker Meetings: Never    Marital Status: Never married  Intimate Partner Violence: Not At Risk (08/18/2022)   Humiliation, Afraid, Rape, and Kick questionnaire    Fear of Current or Ex-Partner: No    Emotionally Abused: No    Physically Abused: No    Sexually Abused: No    Past Surgical History:  Procedure Laterality Date   ABDOMINAL HYSTERECTOMY  2005   total/ Dr Barnabas Lister   BLADDER TUMOR EXCISION  2006   BREAST BIOPSY Left 03/13/2016   INVASIVE MAMMARY CARCINOMA   BREAST LUMPECTOMY Left 2018   BREAST LUMPECTOMY WITH SENTINEL LYMPH NODE BIOPSY Left 07/14/2016   Procedure: BREAST LUMPECTOMY WITH SENTINEL LYMPH NODE BX;  Surgeon: Earline Mayotte, MD;  Location: [facility];  Service: General;  Laterality: Left;   COLONOSCOPY WITH PROPOFOL N/A 01/02/2021   Procedure: COLONOSCOPY WITH PROPOFOL;  Surgeon: Earline Mayotte, MD;  Location: [facility];  Service: Endoscopy;  Laterality: N/A;   denture surgery     ESOPHAGOGASTRODUODENOSCOPY (EGD) WITH PROPOFOL N/A 07/19/2022   Procedure: ESOPHAGOGASTRODUODENOSCOPY (EGD) WITH PROPOFOL;  Surgeon: Toledo, Boykin Nearing, MD;  Location: [facility];  Service: Gastroenterology;  Laterality: N/A;   OOPHORECTOMY     PORTACATH PLACEMENT N/A 04/01/2016   Procedure: INSERTION PORT-A-CATH;  Surgeon: Earline Mayotte, MD;  Location: [facility];  Service: General;  Laterality: N/A;   SKIN CANCER  EXCISION Right 2015   foot/ Dr Margo Aye   TUBAL LIGATION     TUMOR EXCISION Right    FOOT    Family History  Problem Relation Age of Onset   Heart disease Mother    Transient ischemic attack Mother    Emphysema Father    Breast cancer Sister    Breast cancer Cousin        paternal   Kidney cancer Neg Hx    Bladder Cancer Neg Hx     Allergies  Allergen Reactions   Bee Pollen Hives   Mosquito (Diagnostic) Hives   Other Other (See Comments)    Uncoded Allergy.  Allergen: VICRYL  (90% glycolide and 10% L-lactide [polyglactin-- synthetic absorbable sterile surgical suture]), Other Reaction: Infection   Gabapentin Swelling   Mosquito (Culex Pipiens) Allergy Skin Test Hives   Norvasc [Amlodipine Besylate] Swelling    TO FEET   Lisinopril Cough    Current Outpatient Medications on File Prior to Visit  Medication Sig Dispense Refill   acetaminophen (TYLENOL) 500 MG tablet Take 1,000 mg by mouth every 6 (six) hours as needed for moderate pain.      alendronate (FOSAMAX) 70 MG tablet TAKE ONE TABLET (70 MG TOTAL) BY MOUTH ONCE A WEEK. TAKE WITH A FULL GLASS OF WATER ON AN EMPTY STOMACH 12 tablet 3   Ascorbic Acid (VITAMIN C) 1000 MG tablet Take 1,000 mg by mouth daily.     atorvastatin (LIPITOR) 10 MG tablet TAKE ONE TABLET BY MOUTH ONCE A DAY FOR CHOLSTEROL. 90 tablet 2   Calcium Carb-Cholecalciferol (CALCIUM/VITAMIN D PO) Take 1,200 mg by mouth.     cetirizine (ZYRTEC) 10 MG tablet Take 1 tablet (10 mg total) by mouth daily. 30 tablet 11   CRANBERRY PO Take 1 capsule by mouth 2 (two) times daily.     fluticasone (FLONASE) 50 MCG/ACT nasal spray Place 2 sprays into both nostrils daily. 16 g 6   losartan (COZAAR) 100 MG tablet Take 1 tablet (100 mg total) by mouth daily. for blood pressure. 90 tablet 0   ondansetron (ZOFRAN-ODT) 4 MG disintegrating tablet Take 1 tablet (4 mg total) by mouth every 8 (eight) hours as needed for nausea or vomiting. (Patient not taking: Reported on 08/18/2022)  20 tablet 0   Probiotic Product (PROBIOTIC PO) Take 1 tablet by mouth daily.     No current facility-administered medications on file prior to visit.    BP (!) 158/88   Pulse (!) 104   Temp (!) 97.5 F (36.4 C) (Temporal)   Ht 5\' 7"  (1.702 m)   Wt 154 lb (69.9 kg)   SpO2 98%   BMI 24.12 kg/m  Objective:   Physical Exam Cardiovascular:     Rate and Rhythm: Normal rate and regular rhythm.  Pulmonary:     Effort: Pulmonary effort is normal.     Breath sounds: Normal breath sounds.  Abdominal:     Tenderness: There is no abdominal tenderness. There is no right CVA tenderness or left CVA tenderness.  Musculoskeletal:     Right upper arm: No swelling, deformity, tenderness or bony tenderness.       Arms:     Cervical back: Neck supple.  Skin:    General: Skin is warm and dry.           Assessment & Plan:  Dysuria Assessment & Plan: UA today negative. Will send culture given prior history.   Fortunately, she's feeling much better!  Continue regular water intake. Follow up with Urology as scheduled.    Orders: -     POCT Urinalysis Dipstick (Automated) -     Urine Culture  Upper extremity pain, anterior, right Assessment & Plan: No abnormality on exam. Likely a strain from her increased physical activity at home.  Discussed to rest, apply ice/heat. Return precautions provided.    Essential hypertension Assessment & Plan: Above goal today, improved on recheck.  Continue losartan 100 mg daily. Follow up in 2 weeks for BP check, if above goal then add amlodipine 5 mg daily         Doreene Nest, NP

## 2022-09-05 NOTE — Assessment & Plan Note (Signed)
No abnormality on exam. Likely a strain from her increased physical activity at home.  Discussed to rest, apply ice/heat. Return precautions provided.

## 2022-09-05 NOTE — Assessment & Plan Note (Signed)
UA today negative. Will send culture given prior history.   Fortunately, she's feeling much better!  Continue regular water intake. Follow up with Urology as scheduled.

## 2022-09-05 NOTE — Patient Instructions (Signed)
We will be in touch with your urine culture results.   Start monitoring your blood pressure daily, around the same time of day, for the next 2-3 weeks.  Ensure that you have rested for 30 minutes prior to checking your blood pressure.   Record your readings and call me with readings in 2 weeks.  It was a pleasure to see you today!

## 2022-09-07 ENCOUNTER — Other Ambulatory Visit: Payer: Self-pay | Admitting: Primary Care

## 2022-09-07 DIAGNOSIS — N3000 Acute cystitis without hematuria: Secondary | ICD-10-CM

## 2022-09-07 LAB — URINE CULTURE
MICRO NUMBER:: 15001300
SPECIMEN QUALITY:: ADEQUATE

## 2022-09-07 MED ORDER — SULFAMETHOXAZOLE-TRIMETHOPRIM 800-160 MG PO TABS
1.0000 | ORAL_TABLET | Freq: Two times a day (BID) | ORAL | 0 refills | Status: DC
Start: 2022-09-07 — End: 2022-09-19

## 2022-09-19 ENCOUNTER — Encounter: Payer: Self-pay | Admitting: Primary Care

## 2022-09-19 ENCOUNTER — Ambulatory Visit (INDEPENDENT_AMBULATORY_CARE_PROVIDER_SITE_OTHER): Payer: PPO | Admitting: Primary Care

## 2022-09-19 VITALS — BP 138/76 | HR 105 | Temp 97.3°F | Ht 67.0 in | Wt 154.0 lb

## 2022-09-19 DIAGNOSIS — I10 Essential (primary) hypertension: Secondary | ICD-10-CM

## 2022-09-19 DIAGNOSIS — E871 Hypo-osmolality and hyponatremia: Secondary | ICD-10-CM

## 2022-09-19 LAB — BASIC METABOLIC PANEL
BUN: 16 mg/dL (ref 6–23)
CO2: 22 mEq/L (ref 19–32)
Calcium: 9.3 mg/dL (ref 8.4–10.5)
Chloride: 106 mEq/L (ref 96–112)
Creatinine, Ser: 1.27 mg/dL — ABNORMAL HIGH (ref 0.40–1.20)
GFR: 39.91 mL/min — ABNORMAL LOW (ref >60.00)
Glucose, Bld: 156 mg/dL — ABNORMAL HIGH (ref 70–99)
Potassium: 4.5 mEq/L (ref 3.5–5.1)
Sodium: 135 mEq/L (ref 135–145)

## 2022-09-19 MED ORDER — AMLODIPINE BESYLATE 5 MG PO TABS
5.0000 mg | ORAL_TABLET | Freq: Every day | ORAL | 0 refills | Status: DC
Start: 2022-09-19 — End: 2022-09-19

## 2022-09-19 NOTE — Progress Notes (Signed)
Subjective:    Patient ID: Catherine Tate, female    DOB: 12-23-1940, 82 y.o.   MRN: [id number]  HPI  Catherine Tate is a very pleasant 82 y.o. female with a history of breast cancer, hypertension, type 2 diabetes, CKD, osteoporosis, recurrent cystitis who presents today for follow-up of hypertension.  She was last evaluated on 09/05/2022 for various concerns, blood pressure was noted to be above goal for several visits despite management on losartan 100 mg daily.  She was not checking blood pressure at home so she was advised to start checking at home and follow-up in a few weeks for blood pressure check.  Since her last visit she is compliant to losartan 100 mg daily. She has not been checking her BP at home. She denies headaches, dizziness, blurred vision.   BP Readings from Last 3 Encounters:  09/19/22 138/76  09/05/22 (!) 158/88  07/25/22 (!) 162/80      Review of Systems  Eyes:  Negative for visual disturbance.  Respiratory:  Negative for shortness of breath.   Cardiovascular:  Negative for chest pain.  Neurological:  Negative for dizziness.         Past Medical History:  Diagnosis Date   Arthritis    Cancer (HCC) 03/13/2016   INVASIVE MAMMARY CARCINOMA   Cataract 02/03/2018   Cellulitis of left lower extremity 09/23/2021   Chest wall abscess 05/15/2017   Chickenpox    Complication of anesthesia    Frequent UTI    GERD (gastroesophageal reflux disease)    RARE   Hypertension    Personal history of chemotherapy 2017/2018   F/U left breast cancer   Personal history of radiation therapy 2018   F/U left breast cancer   PONV (postoperative nausea and vomiting)    RSD (reflex sympathetic dystrophy)    Seasonal allergies    Skin cancer    Urine incontinence     Social History   Socioeconomic History   Marital status: Single    Spouse name: Not on file   Number of children: Not on file   Years of education: Not on file   Highest education level: Not on file   Occupational History   Occupation: retired  Tobacco Use   Smoking status: Former    Packs/day: 1.00    Years: 20.00    Additional pack years: 0.00    Total pack years: 20.00    Types: Cigarettes    Quit date: 04/14/1994    Years since quitting: 28.4   Smokeless tobacco: Never  Vaping Use   Vaping Use: Never used  Substance and Sexual Activity   Alcohol use: No    Alcohol/week: 0.0 standard drinks of alcohol   Drug use: No   Sexual activity: Not Currently  Other Topics Concern   Not on file  Social History Narrative   Single.   2 children, 1 grandchild.   Retired. Once worked in a Statistician.   Enjoys making jewelry, puzzle books, sewing, traveling.   Son lives with her   Social Determinants of Health   Financial Resource Strain: Low Risk  (08/18/2022)   Overall Financial Resource Strain (CARDIA)    Difficulty of Paying Living Expenses: Not hard at all  Food Insecurity: No Food Insecurity (08/18/2022)   Hunger Vital Sign    Worried About Running Out of Food in the Last Year: Never true    Ran Out of Food in the Last Year: Never true  Transportation Needs: No  Transportation Needs (08/18/2022)   PRAPARE - Administrator, Civil Service (Medical): No    Lack of Transportation (Non-Medical): No  Physical Activity: Inactive (08/18/2022)   Exercise Vital Sign    Days of Exercise per Week: 0 days    Minutes of Exercise per Session: 0 min  Stress: No Stress Concern Present (08/18/2022)   [employer] of Occupational Health - Occupational Stress Questionnaire    Feeling of Stress : Not at all  Social Connections: Moderately Isolated (08/18/2022)   Social Connection and Isolation Panel [NHANES]    Frequency of Communication with Friends and Family: More than three times a week    Frequency of Social Gatherings with Friends and Family: More than three times a week    Attends Religious Services: More than 4 times per year    Active Member of [employer] or Organizations: No     Attends Banker Meetings: Never    Marital Status: Never married  Intimate Partner Violence: Not At Risk (08/18/2022)   Humiliation, Afraid, Rape, and Kick questionnaire    Fear of Current or Ex-Partner: No    Emotionally Abused: No    Physically Abused: No    Sexually Abused: No    Past Surgical History:  Procedure Laterality Date   ABDOMINAL HYSTERECTOMY  2005   total/ Dr Barnabas Lister   BLADDER TUMOR EXCISION  2006   BREAST BIOPSY Left 03/13/2016   INVASIVE MAMMARY CARCINOMA   BREAST LUMPECTOMY Left 2018   BREAST LUMPECTOMY WITH SENTINEL LYMPH NODE BIOPSY Left 07/14/2016   Procedure: BREAST LUMPECTOMY WITH SENTINEL LYMPH NODE BX;  Surgeon: Earline Mayotte, MD;  Location: [facility];  Service: General;  Laterality: Left;   COLONOSCOPY WITH PROPOFOL N/A 01/02/2021   Procedure: COLONOSCOPY WITH PROPOFOL;  Surgeon: Earline Mayotte, MD;  Location: [facility];  Service: Endoscopy;  Laterality: N/A;   denture surgery     ESOPHAGOGASTRODUODENOSCOPY (EGD) WITH PROPOFOL N/A 07/19/2022   Procedure: ESOPHAGOGASTRODUODENOSCOPY (EGD) WITH PROPOFOL;  Surgeon: Toledo, Boykin Nearing, MD;  Location: [facility];  Service: Gastroenterology;  Laterality: N/A;   OOPHORECTOMY     PORTACATH PLACEMENT N/A 04/01/2016   Procedure: INSERTION PORT-A-CATH;  Surgeon: Earline Mayotte, MD;  Location: [facility];  Service: General;  Laterality: N/A;   SKIN CANCER EXCISION Right 2015   foot/ Dr Margo Aye   TUBAL LIGATION     TUMOR EXCISION Right    FOOT    Family History  Problem Relation Age of Onset   Heart disease Mother    Transient ischemic attack Mother    Emphysema Father    Breast cancer Sister    Breast cancer Cousin        paternal   Kidney cancer Neg Hx    Bladder Cancer Neg Hx     Allergies  Allergen Reactions   Bee Pollen Hives   Mosquito (Diagnostic) Hives   Other Other (See Comments)    Uncoded Allergy. Allergen: VICRYL  (90% glycolide and 10% L-lactide [polyglactin--  synthetic absorbable sterile surgical suture]), Other Reaction: Infection   Gabapentin Swelling   Mosquito (Culex Pipiens) Allergy Skin Test Hives   Norvasc [Amlodipine Besylate] Swelling    TO FEET   Lisinopril Cough    Current Outpatient Medications on File Prior to Visit  Medication Sig Dispense Refill   acetaminophen (TYLENOL) 500 MG tablet Take 1,000 mg by mouth every 6 (six) hours as needed for moderate pain.      alendronate (FOSAMAX) 70 MG  tablet TAKE ONE TABLET (70 MG TOTAL) BY MOUTH ONCE A WEEK. TAKE WITH A FULL GLASS OF WATER ON AN EMPTY STOMACH 12 tablet 3   Ascorbic Acid (VITAMIN C) 1000 MG tablet Take 1,000 mg by mouth daily.     atorvastatin (LIPITOR) 10 MG tablet TAKE ONE TABLET BY MOUTH ONCE A DAY FOR CHOLSTEROL. 90 tablet 2   Calcium Carb-Cholecalciferol (CALCIUM/VITAMIN D PO) Take 1,200 mg by mouth.     cetirizine (ZYRTEC) 10 MG tablet Take 1 tablet (10 mg total) by mouth daily. 30 tablet 11   CRANBERRY PO Take 1 capsule by mouth 2 (two) times daily.     fluticasone (FLONASE) 50 MCG/ACT nasal spray Place 2 sprays into both nostrils daily. 16 g 6   losartan (COZAAR) 100 MG tablet Take 1 tablet (100 mg total) by mouth daily. for blood pressure. 90 tablet 0   Probiotic Product (PROBIOTIC PO) Take 1 tablet by mouth daily.     No current facility-administered medications on file prior to visit.    BP 138/76   Pulse (!) 105   Temp (!) 97.3 F (36.3 C) (Temporal)   Ht 5\' 7"  (1.702 m)   Wt 154 lb (69.9 kg)   SpO2 98%   BMI 24.12 kg/m  Objective:   Physical Exam Cardiovascular:     Rate and Rhythm: Normal rate and regular rhythm.  Pulmonary:     Effort: Pulmonary effort is normal.     Breath sounds: Normal breath sounds.  Musculoskeletal:     Cervical back: Neck supple.  Skin:    General: Skin is warm and dry.           Assessment & Plan:  Essential hypertension Assessment & Plan: Above goal initially, significant improvement upon recheck.  Continue  losartan 100 mg daily.  I have asked that she start monitoring her blood pressure at home and notify if she sees readings consistently at or above 150/90.  BMP pending today to recheck sodium levels.   Orders: -     Basic metabolic panel  Hyponatremia Assessment & Plan: No longer on hydrochlorothiazide. Repeat sodium levels pending.         Doreene Nest, NP

## 2022-09-19 NOTE — Assessment & Plan Note (Addendum)
Above goal initially, significant improvement upon recheck.  Continue losartan 100 mg daily.  I have asked that she start monitoring her blood pressure at home and notify if she sees readings consistently at or above 150/90.  BMP pending today to recheck sodium levels.

## 2022-09-19 NOTE — Assessment & Plan Note (Signed)
No longer on hydrochlorothiazide. Repeat sodium levels pending.

## 2022-09-19 NOTE — Patient Instructions (Addendum)
Continue taking losartan 100 mg daily for blood pressure.  Start monitoring your blood pressure daily, around the same time of day, for the next 2-3 weeks.  Ensure that you have rested for 30 minutes prior to checking your blood pressure.   Record your readings and notify me if you see numbers consistently at or above 150 on top and/or 90 on bottom.   Stop by the lab prior to leaving today. I will notify you of your results once received.   It was a pleasure to see you today!

## 2022-09-22 ENCOUNTER — Ambulatory Visit: Payer: PPO | Admitting: Urology

## 2022-09-22 ENCOUNTER — Encounter: Payer: Self-pay | Admitting: Urology

## 2022-09-22 VITALS — BP 144/84 | HR 116 | Ht 66.0 in | Wt 154.0 lb

## 2022-09-22 DIAGNOSIS — Z87440 Personal history of urinary (tract) infections: Secondary | ICD-10-CM | POA: Diagnosis not present

## 2022-09-22 DIAGNOSIS — N3592 Unspecified urethral stricture, female: Secondary | ICD-10-CM

## 2022-09-22 DIAGNOSIS — R8281 Pyuria: Secondary | ICD-10-CM

## 2022-09-22 DIAGNOSIS — N390 Urinary tract infection, site not specified: Secondary | ICD-10-CM | POA: Diagnosis not present

## 2022-09-22 LAB — BLADDER SCAN AMB NON-IMAGING: Scan Result: 0

## 2022-09-22 LAB — URINALYSIS, COMPLETE
Bilirubin, UA: NEGATIVE
Glucose, UA: NEGATIVE
Ketones, UA: NEGATIVE
Nitrite, UA: NEGATIVE
Protein,UA: NEGATIVE
RBC, UA: NEGATIVE
Specific Gravity, UA: <1.005 — ABNORMAL LOW (ref 1.005–1.030)
Urobilinogen, Ur: 0.2 mg/dL (ref 0.2–1.0)
pH, UA: 5.5 (ref 5.0–7.5)

## 2022-09-22 LAB — MICROSCOPIC EXAMINATION

## 2022-09-22 NOTE — Progress Notes (Signed)
I, Maysun L Gibbs,acting as a scribe for Riki Altes, MD.,have documented all relevant documentation on the behalf of Riki Altes, MD,as directed by  Riki Altes, MD while in the presence of Riki Altes, MD.  09/22/2022 10:04 AM   [name] Dec 28, 1940 [id number]  Referring provider: Doreene Nest, NP [address] [place],  [place] [address]  Chief Complaint  Patient presents with   Recurrent UTI    HPI: Catherine Tate is a 82 y.o. female referred for recurrent UTI.   Patient was seen as an inpatient consultation by me on 07/18/22 for urinary retention and required urethral dilation for Foley catheter placement. She had a positive urine culture 09/05/22 for Klebsiella. She was having mild dysuria the week before. That was significantly improved at the day of her visit.  She was never seen in follow-up from her urethral dilation and had her catheter removed 1 week after discharge when she was staying with her daughter in the [place] part of the state.   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   Cataract 02/03/2018   Cellulitis of left lower extremity 09/23/2021   Chest wall abscess 05/15/2017   Chickenpox    Complication of anesthesia    Frequent UTI    GERD (gastroesophageal reflux disease)    RARE   Hypertension    Personal history of chemotherapy 2017/2018   F/U left breast cancer   Personal history of radiation therapy 2018   F/U left breast cancer   PONV (postoperative nausea and vomiting)    RSD (reflex sympathetic dystrophy)    Seasonal allergies    Skin cancer    Urine incontinence     Surgical History: Past Surgical History:  Procedure Laterality Date   ABDOMINAL HYSTERECTOMY  2005   total/ Dr Barnabas Lister   BLADDER TUMOR EXCISION  2006   BREAST BIOPSY Left 03/13/2016   INVASIVE MAMMARY CARCINOMA   BREAST LUMPECTOMY Left 2018   BREAST LUMPECTOMY WITH  SENTINEL LYMPH NODE BIOPSY Left 07/14/2016   Procedure: BREAST LUMPECTOMY WITH SENTINEL LYMPH NODE BX;  Surgeon: Earline Mayotte, MD;  Location: [facility];  Service: General;  Laterality: Left;   COLONOSCOPY WITH PROPOFOL N/A 01/02/2021   Procedure: COLONOSCOPY WITH PROPOFOL;  Surgeon: Earline Mayotte, MD;  Location: [facility];  Service: Endoscopy;  Laterality: N/A;   denture surgery     ESOPHAGOGASTRODUODENOSCOPY (EGD) WITH PROPOFOL N/A 07/19/2022   Procedure: ESOPHAGOGASTRODUODENOSCOPY (EGD) WITH PROPOFOL;  Surgeon: Toledo, Boykin Nearing, MD;  Location: [facility];  Service: Gastroenterology;  Laterality: N/A;   OOPHORECTOMY     PORTACATH PLACEMENT N/A 04/01/2016   Procedure: INSERTION PORT-A-CATH;  Surgeon: Earline Mayotte, MD;  Location: [facility];  Service: General;  Laterality: N/A;   SKIN CANCER EXCISION Right 2015   foot/ Dr Margo Aye   TUBAL LIGATION     TUMOR EXCISION Right    FOOT    Home Medications:  Allergies as of 09/22/2022       Reactions   Bee Pollen Hives   Mosquito (diagnostic) Hives   Other Other (See Comments)   Uncoded Allergy. Allergen: VICRYL  (90% glycolide and 10% L-lactide [polyglactin-- synthetic absorbable sterile surgical suture]), Other Reaction: Infection   Gabapentin Swelling   Mosquito (culex Pipiens) Allergy Skin Test Hives   Norvasc [amlodipine Besylate] Swelling   TO FEET   Lisinopril Cough  Medication List        Accurate as of September 22, 2022 10:04 AM. If you have any questions, ask your nurse or doctor.          acetaminophen 500 MG tablet Commonly known as: TYLENOL Take 1,000 mg by mouth every 6 (six) hours as needed for moderate pain.   alendronate 70 MG tablet Commonly known as: FOSAMAX TAKE ONE TABLET (70 MG TOTAL) BY MOUTH ONCE A WEEK. TAKE WITH A FULL GLASS OF WATER ON AN EMPTY STOMACH   atorvastatin 10 MG tablet Commonly known as: LIPITOR TAKE ONE TABLET BY MOUTH ONCE A DAY FOR CHOLSTEROL.   CALCIUM/VITAMIN D  PO Take 1,200 mg by mouth.   cetirizine 10 MG tablet Commonly known as: ZYRTEC Take 1 tablet (10 mg total) by mouth daily.   CRANBERRY PO Take 1 capsule by mouth 2 (two) times daily.   fluticasone 50 MCG/ACT nasal spray Commonly known as: FLONASE Place 2 sprays into both nostrils daily.   losartan 100 MG tablet Commonly known as: COZAAR Take 1 tablet (100 mg total) by mouth daily. for blood pressure.   PROBIOTIC PO Take 1 tablet by mouth daily.   vitamin C 1000 MG tablet Take 1,000 mg by mouth daily.        Allergies:  Allergies  Allergen Reactions   Bee Pollen Hives   Mosquito (Diagnostic) Hives   Other Other (See Comments)    Uncoded Allergy. Allergen: VICRYL  (90% glycolide and 10% L-lactide [polyglactin-- synthetic absorbable sterile surgical suture]), Other Reaction: Infection   Gabapentin Swelling   Mosquito (Culex Pipiens) Allergy Skin Test Hives   Norvasc [Amlodipine Besylate] Swelling    TO FEET   Lisinopril Cough    Family History: Family History  Problem Relation Age of Onset   Heart disease Mother    Transient ischemic attack Mother    Emphysema Father    Breast cancer Sister    Breast cancer Cousin        paternal   Kidney cancer Neg Hx    Bladder Cancer Neg Hx     Social History:  reports that she quit smoking about 28 years ago. Her smoking use included cigarettes. She has a 20.00 pack-year smoking history. She has never used smokeless tobacco. She reports that she does not drink alcohol and does not use drugs.   Physical Exam: BP (!) 144/84   Pulse (!) 116   Ht 5\' 6"  (1.676 m)   Wt 154 lb (69.9 kg)   BMI 24.86 kg/m   Constitutional:  Alert and oriented, No acute distress. HEENT: [place] AT Respiratory: Normal respiratory effort, no increased work of breathing. Psychiatric: Normal mood and affect.   Assessment & Plan:    1. 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  PVR today 0 mL   Schedule cystoscopy UA today with 11-30 RBCs and urine culture ordered.  I have reviewed the above documentation for accuracy and completeness, and I agree with the above.   Riki Altes, MD  [facility] [address] [place], [place] [address] [phone]

## 2022-09-22 NOTE — Patient Instructions (Signed)

## 2022-09-25 LAB — CULTURE, URINE COMPREHENSIVE

## 2022-09-30 ENCOUNTER — Telehealth: Payer: Self-pay | Admitting: *Deleted

## 2022-09-30 ENCOUNTER — Other Ambulatory Visit: Payer: Self-pay | Admitting: *Deleted

## 2022-09-30 MED ORDER — CEFUROXIME AXETIL 250 MG PO TABS: 250.0000 mg | ORAL_TABLET | Freq: Two times a day (BID) | ORAL | 0 refills | Status: AC

## 2022-09-30 NOTE — Telephone Encounter (Addendum)
Notified patient as instructed, patient pleased. Discussed follow-up appointments, patient agrees Patient to start medication tomorrow

## 2022-09-30 NOTE — Telephone Encounter (Signed)
-----   Message from Riki Altes, MD sent at 09/26/2022  7:09 AM EDT ----- Urine culture was positive.  Scheduled for cystoscopy next week.  Please send Rx cefuroxime 250 mg twice daily x 7 days

## 2022-10-09 ENCOUNTER — Ambulatory Visit: Payer: PPO | Admitting: Urology

## 2022-10-09 DIAGNOSIS — N390 Urinary tract infection, site not specified: Secondary | ICD-10-CM

## 2022-10-09 DIAGNOSIS — N3592 Unspecified urethral stricture, female: Secondary | ICD-10-CM

## 2022-10-09 LAB — URINALYSIS, COMPLETE
Bilirubin, UA: NEGATIVE
Glucose, UA: NEGATIVE
Ketones, UA: NEGATIVE
Leukocytes,UA: NEGATIVE
Nitrite, UA: NEGATIVE
Protein,UA: NEGATIVE
RBC, UA: NEGATIVE
Specific Gravity, UA: 1.01 (ref 1.005–1.030)
Urobilinogen, Ur: 0.2 mg/dL (ref 0.2–1.0)
pH, UA: 5.5 (ref 5.0–7.5)

## 2022-10-09 LAB — MICROSCOPIC EXAMINATION

## 2022-10-09 NOTE — Progress Notes (Signed)
10/09/2022 1:01 PM   Catherine Tate 02-01-1941 [id number]  Referring provider: Doreene Nest, NP [address] [place],  [place] [address]  Chief Complaint  Patient presents with   Cysto    HPI: Refer to my previous note 09/22/2022.  Flexible cystoscopy was attempted in the office this morning however due to urethral narrowing was unable to be performed.  Recommended scheduling cystoscopy with urethral dilation under sedation.   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   Cataract 02/03/2018   Cellulitis of left lower extremity 09/23/2021   Chest wall abscess 05/15/2017   Chickenpox    Complication of anesthesia    Frequent UTI    GERD (gastroesophageal reflux disease)    RARE   Hypertension    Personal history of chemotherapy 2017/2018   F/U left breast cancer   Personal history of radiation therapy 2018   F/U left breast cancer   PONV (postoperative nausea and vomiting)    RSD (reflex sympathetic dystrophy)    Seasonal allergies    Skin cancer    Urine incontinence     Surgical History: Past Surgical History:  Procedure Laterality Date   ABDOMINAL HYSTERECTOMY  2005   total/ Dr Barnabas Lister   BLADDER TUMOR EXCISION  2006   BREAST BIOPSY Left 03/13/2016   INVASIVE MAMMARY CARCINOMA   BREAST LUMPECTOMY Left 2018   BREAST LUMPECTOMY WITH SENTINEL LYMPH NODE BIOPSY Left 07/14/2016   Procedure: BREAST LUMPECTOMY WITH SENTINEL LYMPH NODE BX;  Surgeon: Earline Mayotte, MD;  Location: [facility];  Service: General;  Laterality: Left;   COLONOSCOPY WITH PROPOFOL N/A 01/02/2021   Procedure: COLONOSCOPY WITH PROPOFOL;  Surgeon: Earline Mayotte, MD;  Location: [facility];  Service: Endoscopy;  Laterality: N/A;   denture surgery     ESOPHAGOGASTRODUODENOSCOPY (EGD) WITH PROPOFOL N/A 07/19/2022   Procedure: ESOPHAGOGASTRODUODENOSCOPY (EGD) WITH PROPOFOL;  Surgeon: Toledo, Boykin Nearing, MD;   Location: [facility];  Service: Gastroenterology;  Laterality: N/A;   OOPHORECTOMY     PORTACATH PLACEMENT N/A 04/01/2016   Procedure: INSERTION PORT-A-CATH;  Surgeon: Earline Mayotte, MD;  Location: [facility];  Service: General;  Laterality: N/A;   SKIN CANCER EXCISION Right 2015   foot/ Dr Margo Aye   TUBAL LIGATION     TUMOR EXCISION Right    FOOT    Home Medications:  Allergies as of 10/09/2022       Reactions   Bee Pollen Hives   Mosquito (diagnostic) Hives   Other Other (See Comments)   Uncoded Allergy. Allergen: VICRYL  (90% glycolide and 10% L-lactide [polyglactin-- synthetic absorbable sterile surgical suture]), Other Reaction: Infection   Gabapentin Swelling   Mosquito (culex Pipiens) Allergy Skin Test Hives   Norvasc [amlodipine Besylate] Swelling   TO FEET   Lisinopril Cough        Medication List        Accurate as of October 09, 2022 11:59 PM. If you have any questions, ask your nurse or doctor.          acetaminophen 500 MG tablet Commonly known as: TYLENOL Take 1,000 mg by mouth every 6 (six) hours as needed for moderate pain.   alendronate 70 MG tablet Commonly known as: FOSAMAX TAKE ONE TABLET (70 MG TOTAL) BY MOUTH ONCE A WEEK. TAKE WITH A FULL GLASS OF WATER ON AN EMPTY STOMACH   atorvastatin 10 MG tablet Commonly known as:  LIPITOR TAKE ONE TABLET BY MOUTH ONCE A DAY FOR CHOLSTEROL.   CALCIUM/VITAMIN D PO Take 1,200 mg by mouth.   cetirizine 10 MG tablet Commonly known as: ZYRTEC Take 1 tablet (10 mg total) by mouth daily.   CRANBERRY PO Take 1 capsule by mouth 2 (two) times daily.   fluticasone 50 MCG/ACT nasal spray Commonly known as: FLONASE Place 2 sprays into both nostrils daily.   losartan 100 MG tablet Commonly known as: COZAAR Take 1 tablet (100 mg total) by mouth daily. for blood pressure.   PROBIOTIC PO Take 1 tablet by mouth daily.   vitamin C 1000 MG tablet Take 1,000 mg by mouth daily.        Allergies:   Allergies  Allergen Reactions   Bee Pollen Hives   Mosquito (Diagnostic) Hives   Other Other (See Comments)    Uncoded Allergy. Allergen: VICRYL  (90% glycolide and 10% L-lactide [polyglactin-- synthetic absorbable sterile surgical suture]), Other Reaction: Infection   Gabapentin Swelling   Mosquito (Culex Pipiens) Allergy Skin Test Hives   Norvasc [Amlodipine Besylate] Swelling    TO FEET   Lisinopril Cough    Family History: Family History  Problem Relation Age of Onset   Heart disease Mother    Transient ischemic attack Mother    Emphysema Father    Breast cancer Sister    Breast cancer Cousin        paternal   Kidney cancer Neg Hx    Bladder Cancer Neg Hx     Social History:  reports that she quit smoking about 28 years ago. Her smoking use included cigarettes. She has a 20.00 pack-year smoking history. She has never used smokeless tobacco. She reports that she does not drink alcohol and does not use drugs.   Physical Exam: There were no vitals taken for this visit.  Constitutional:  Alert and oriented, No acute distress. HEENT: [place] AT Respiratory: Normal respiratory effort, no increased work of breathing. Psychiatric: Normal mood and affect.   Assessment & Plan:    1.  Urethral stricture Cystoscopy unable to be performed today Recommend scheduling urethral dilation under sedation and cystoscopy post dilation The procedure was discussed in detail including potential risk of bleeding, infection and recurrent stricture.  All questions were answered and she desires to proceed   Riki Altes, MD  [facility] [address] [place], [place] [address] [phone]

## 2022-10-09 NOTE — H&P (View-Only) (Signed)
10/09/2022 1:01 PM   Catherine Tate 02-01-1941 [id number]  Referring provider: Doreene Nest, NP [address] [place],  [place] [address]  Chief Complaint  Patient presents with   Cysto    HPI: Refer to my previous note 09/22/2022.  Flexible cystoscopy was attempted in the office this morning however due to urethral narrowing was unable to be performed.  Recommended scheduling cystoscopy with urethral dilation under sedation.   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   Cataract 02/03/2018   Cellulitis of left lower extremity 09/23/2021   Chest wall abscess 05/15/2017   Chickenpox    Complication of anesthesia    Frequent UTI    GERD (gastroesophageal reflux disease)    RARE   Hypertension    Personal history of chemotherapy 2017/2018   F/U left breast cancer   Personal history of radiation therapy 2018   F/U left breast cancer   PONV (postoperative nausea and vomiting)    RSD (reflex sympathetic dystrophy)    Seasonal allergies    Skin cancer    Urine incontinence     Surgical History: Past Surgical History:  Procedure Laterality Date   ABDOMINAL HYSTERECTOMY  2005   total/ Dr Barnabas Lister   BLADDER TUMOR EXCISION  2006   BREAST BIOPSY Left 03/13/2016   INVASIVE MAMMARY CARCINOMA   BREAST LUMPECTOMY Left 2018   BREAST LUMPECTOMY WITH SENTINEL LYMPH NODE BIOPSY Left 07/14/2016   Procedure: BREAST LUMPECTOMY WITH SENTINEL LYMPH NODE BX;  Surgeon: Earline Mayotte, MD;  Location: [facility];  Service: General;  Laterality: Left;   COLONOSCOPY WITH PROPOFOL N/A 01/02/2021   Procedure: COLONOSCOPY WITH PROPOFOL;  Surgeon: Earline Mayotte, MD;  Location: [facility];  Service: Endoscopy;  Laterality: N/A;   denture surgery     ESOPHAGOGASTRODUODENOSCOPY (EGD) WITH PROPOFOL N/A 07/19/2022   Procedure: ESOPHAGOGASTRODUODENOSCOPY (EGD) WITH PROPOFOL;  Surgeon: Toledo, Boykin Nearing, MD;   Location: [facility];  Service: Gastroenterology;  Laterality: N/A;   OOPHORECTOMY     PORTACATH PLACEMENT N/A 04/01/2016   Procedure: INSERTION PORT-A-CATH;  Surgeon: Earline Mayotte, MD;  Location: [facility];  Service: General;  Laterality: N/A;   SKIN CANCER EXCISION Right 2015   foot/ Dr Margo Aye   TUBAL LIGATION     TUMOR EXCISION Right    FOOT    Home Medications:  Allergies as of 10/09/2022       Reactions   Bee Pollen Hives   Mosquito (diagnostic) Hives   Other Other (See Comments)   Uncoded Allergy. Allergen: VICRYL  (90% glycolide and 10% L-lactide [polyglactin-- synthetic absorbable sterile surgical suture]), Other Reaction: Infection   Gabapentin Swelling   Mosquito (culex Pipiens) Allergy Skin Test Hives   Norvasc [amlodipine Besylate] Swelling   TO FEET   Lisinopril Cough        Medication List        Accurate as of October 09, 2022 11:59 PM. If you have any questions, ask your nurse or doctor.          acetaminophen 500 MG tablet Commonly known as: TYLENOL Take 1,000 mg by mouth every 6 (six) hours as needed for moderate pain.   alendronate 70 MG tablet Commonly known as: FOSAMAX TAKE ONE TABLET (70 MG TOTAL) BY MOUTH ONCE A WEEK. TAKE WITH A FULL GLASS OF WATER ON AN EMPTY STOMACH   atorvastatin 10 MG tablet Commonly known as:  LIPITOR TAKE ONE TABLET BY MOUTH ONCE A DAY FOR CHOLSTEROL.   CALCIUM/VITAMIN D PO Take 1,200 mg by mouth.   cetirizine 10 MG tablet Commonly known as: ZYRTEC Take 1 tablet (10 mg total) by mouth daily.   CRANBERRY PO Take 1 capsule by mouth 2 (two) times daily.   fluticasone 50 MCG/ACT nasal spray Commonly known as: FLONASE Place 2 sprays into both nostrils daily.   losartan 100 MG tablet Commonly known as: COZAAR Take 1 tablet (100 mg total) by mouth daily. for blood pressure.   PROBIOTIC PO Take 1 tablet by mouth daily.   vitamin C 1000 MG tablet Take 1,000 mg by mouth daily.        Allergies:   Allergies  Allergen Reactions   Bee Pollen Hives   Mosquito (Diagnostic) Hives   Other Other (See Comments)    Uncoded Allergy. Allergen: VICRYL  (90% glycolide and 10% L-lactide [polyglactin-- synthetic absorbable sterile surgical suture]), Other Reaction: Infection   Gabapentin Swelling   Mosquito (Culex Pipiens) Allergy Skin Test Hives   Norvasc [Amlodipine Besylate] Swelling    TO FEET   Lisinopril Cough    Family History: Family History  Problem Relation Age of Onset   Heart disease Mother    Transient ischemic attack Mother    Emphysema Father    Breast cancer Sister    Breast cancer Cousin        paternal   Kidney cancer Neg Hx    Bladder Cancer Neg Hx     Social History:  reports that she quit smoking about 28 years ago. Her smoking use included cigarettes. She has a 20.00 pack-year smoking history. She has never used smokeless tobacco. She reports that she does not drink alcohol and does not use drugs.   Physical Exam: There were no vitals taken for this visit.  Constitutional:  Alert and oriented, No acute distress. HEENT: [place] AT Respiratory: Normal respiratory effort, no increased work of breathing. Psychiatric: Normal mood and affect.   Assessment & Plan:    1.  Urethral stricture Cystoscopy unable to be performed today Recommend scheduling urethral dilation under sedation and cystoscopy post dilation The procedure was discussed in detail including potential risk of bleeding, infection and recurrent stricture.  All questions were answered and she desires to proceed   Riki Altes, MD  [facility] [address] [place], [place] [address] [phone]

## 2022-10-14 ENCOUNTER — Other Ambulatory Visit: Payer: Self-pay | Admitting: Urology

## 2022-10-14 DIAGNOSIS — N3592 Unspecified urethral stricture, female: Secondary | ICD-10-CM

## 2022-10-14 NOTE — Progress Notes (Unsigned)
Surgical Physician Order Form [facility]  * Scheduling expectation : Next Available  *Length of Case: 30 minutes  *Clearance needed: no  *Anticoagulation Instructions: N/A  *Aspirin Instructions: N/A  *Post-op visit Date/Instructions:  1 week cath removal  *Diagnosis: Urethral Stricture  *Procedure:   Cystoscopy with urethral dilation   Additional orders: N/A  -Admit type: OUTpatient  -Anesthesia: MAC  -VTE Prophylaxis Standing Order SCD's       Other:   -Standing Lab Orders Per Anesthesia    Lab other: UA&Urine Culture  -Standing Test orders EKG/Chest x-ray per Anesthesia       Test other:   - Medications:  Ancef 2gm IV  -Other orders:  N/A

## 2022-10-15 ENCOUNTER — Telehealth: Payer: Self-pay

## 2022-10-15 NOTE — Telephone Encounter (Signed)
I spoke with Catherine Tate. We have discussed possible surgery dates and Tuesday July 16th, 2024 was agreed upon by all parties. Patient given information about surgery date, what to expect pre-operatively and post operatively.  We discussed that a Pre-Admission Testing office will be calling to set up the pre-op visit that will take place prior to surgery, and that these appointments are typically done over the phone with a Pre-Admissions RN. Informed patient that our office will communicate any additional care to be provided after surgery. Patients questions or concerns were discussed during our call. Advised to call our office should there be any additional information, questions or concerns that arise. Patient verbalized understanding.

## 2022-10-15 NOTE — Progress Notes (Signed)
   [facility] Surgical Posting Form  Surgery Date: Date: 10/28/2022  Surgeon: Dr. Irineo Axon, MD  Inpt ( No  )   Outpt (Yes)   Obs ( No  )   Diagnosis: N35.92 Urethral Stricture  -CPT: 16109  Surgery: Cystoscopy with Urethral Dilation  Stop Anticoagulations: No  Cardiac/Medical/Pulmonary Clearance needed: no  *Orders entered into EPIC  Date: 10/15/22   *Case booked in [place]  Date: 10/15/22  *Notified pt of Surgery: Date: 10/15/22  PRE-OP UA & CX: Yes, will obtain in clinic on 10/17/2022  *Placed into Prior Authorization Work Angela Nevin Date: 10/15/22  Assistant/laser/rep:No

## 2022-10-17 ENCOUNTER — Other Ambulatory Visit: Payer: PPO

## 2022-10-17 DIAGNOSIS — N3592 Unspecified urethral stricture, female: Secondary | ICD-10-CM

## 2022-10-17 LAB — MICROSCOPIC EXAMINATION

## 2022-10-17 LAB — URINALYSIS, COMPLETE
Bilirubin, UA: NEGATIVE
Glucose, UA: NEGATIVE
Ketones, UA: NEGATIVE
Nitrite, UA: NEGATIVE
Protein,UA: NEGATIVE
RBC, UA: NEGATIVE
Specific Gravity, UA: <1.005 — ABNORMAL LOW (ref 1.005–1.030)
Urobilinogen, Ur: 0.2 mg/dL (ref 0.2–1.0)
pH, UA: 6.5 (ref 5.0–7.5)

## 2022-10-18 ENCOUNTER — Other Ambulatory Visit: Payer: Self-pay | Admitting: Primary Care

## 2022-10-18 DIAGNOSIS — I10 Essential (primary) hypertension: Secondary | ICD-10-CM

## 2022-10-19 LAB — CULTURE, URINE COMPREHENSIVE

## 2022-10-20 ENCOUNTER — Encounter
Admission: RE | Admit: 2022-10-20 | Discharge: 2022-10-20 | Disposition: A | Discharge status: Home / Self Care | Payer: PPO | Source: Ambulatory Visit | Attending: Urology | Admitting: Urology

## 2022-10-20 VITALS — Ht 66.0 in | Wt 154.0 lb

## 2022-10-20 DIAGNOSIS — Z01812 Encounter for preprocedural laboratory examination: Secondary | ICD-10-CM

## 2022-10-20 HISTORY — DX: Hyperlipidemia, unspecified: E78.5

## 2022-10-20 HISTORY — DX: Type 2 diabetes mellitus with hyperglycemia: E11.65

## 2022-10-20 HISTORY — DX: Chronic kidney disease, stage 3 unspecified: N18.30

## 2022-10-20 HISTORY — DX: Age-related osteoporosis without current pathological fracture: M81.0

## 2022-10-20 NOTE — Patient Instructions (Addendum)
Your procedure is scheduled on: Tuesday, July 16 Report to the Registration Desk on the 1st floor of the [facility] To find out your arrival time, please call [phone] between 1PM - 3PM on: Monday, July 15 If your arrival time is 6:00 am, do not arrive before that time as the [facility] entrance doors do not open until 6:00 am.  REMEMBER: Instructions that are not followed completely may result in serious medical risk, up to and including death; or upon the discretion of your surgeon and anesthesiologist your surgery may need to be rescheduled.  Do not eat or drink after midnight the night before surgery.  No gum chewing or hard candies.  One week prior to surgery: starting July 9 Stop Anti-inflammatories (NSAIDS) such as Advil, Aleve, Ibuprofen, Motrin, Naproxen, Naprosyn and Aspirin based products such as Excedrin, Goody's Powder, BC Powder. Stop ANY OVER THE COUNTER supplements until after surgery. Stop Vitamin C, calcium, cranberry, probiotic. You may however, continue to take Tylenol if needed for pain up until the day of surgery.  Continue taking all prescribed medications  TAKE ONLY THESE MEDICATIONS THE MORNING OF SURGERY WITH A SIP OF WATER:  Atorvastatin (Lipitor)  No Alcohol for 24 hours before or after surgery.  No Smoking including e-cigarettes for 24 hours before surgery.  No chewable tobacco products for at least 6 hours before surgery.  No nicotine patches on the day of surgery.  Do not use any "recreational" drugs for at least a week (preferably 2 weeks) before your surgery.  Please be advised that the combination of cocaine and anesthesia may have negative outcomes, up to and including death. If you test positive for cocaine, your surgery will be cancelled.  On the morning of surgery brush your teeth with toothpaste and water, you may rinse your mouth with mouthwash if you wish. Do not swallow any toothpaste or mouthwash.  Do not wear jewelry, make-up,  hairpins, clips or nail polish.  Do not wear lotions, powders, or perfumes.   Do not shave body hair from the neck down 48 hours before surgery.  Contact lenses, hearing aids and dentures may not be worn into surgery.  Do not bring valuables to the hospital. [facility] is not responsible for any missing/lost belongings or valuables.   Notify your doctor if there is any change in your medical condition (cold, fever, infection).  Wear comfortable clothing (specific to your surgery type) to the hospital.  After surgery, you can help prevent lung complications by doing breathing exercises.  Take deep breaths and cough every 1-2 hours. Your doctor may order a device called an Incentive Spirometer to help you take deep breaths.  If you are being discharged the day of surgery, you will not be allowed to drive home. You will need a responsible individual to drive you home and stay with you for 24 hours after surgery.   If you are taking public transportation, you will need to have a responsible individual with you.  Please call the Pre-admissions Testing Dept. at [phone] if you have any questions about these instructions.  Surgery Visitation Policy:  Patients having surgery or a procedure may have two visitors.  Children under the age of 18 must have an adult with them who is not the patient.

## 2022-10-22 ENCOUNTER — Other Ambulatory Visit: Payer: Self-pay

## 2022-10-22 LAB — CULTURE, URINE COMPREHENSIVE

## 2022-10-22 MED ORDER — SULFAMETHOXAZOLE-TRIMETHOPRIM 800-160 MG PO TABS: 1.0000 | ORAL_TABLET | Freq: Two times a day (BID) | ORAL | 0 refills | Status: DC

## 2022-10-28 ENCOUNTER — Ambulatory Visit: Payer: PPO | Admitting: Urgent Care

## 2022-10-28 ENCOUNTER — Encounter: Payer: Self-pay | Admitting: Urology

## 2022-10-28 ENCOUNTER — Encounter
Admission: RE | Disposition: A | Discharge status: Home / Self Care | Payer: Self-pay | Source: Home / Self Care | Attending: Urology

## 2022-10-28 ENCOUNTER — Ambulatory Visit: Payer: PPO | Admitting: Anesthesiology

## 2022-10-28 ENCOUNTER — Other Ambulatory Visit: Payer: Self-pay

## 2022-10-28 ENCOUNTER — Ambulatory Visit
Admission: RE | Admit: 2022-10-28 | Discharge: 2022-10-28 | Disposition: A | Discharge status: Home / Self Care | Payer: PPO | Attending: Urology | Admitting: Urology

## 2022-10-28 DIAGNOSIS — I129 Hypertensive chronic kidney disease with stage 1 through stage 4 chronic kidney disease, or unspecified chronic kidney disease: Secondary | ICD-10-CM | POA: Diagnosis not present

## 2022-10-28 DIAGNOSIS — E119 Type 2 diabetes mellitus without complications: Secondary | ICD-10-CM | POA: Diagnosis not present

## 2022-10-28 DIAGNOSIS — I10 Essential (primary) hypertension: Secondary | ICD-10-CM | POA: Diagnosis not present

## 2022-10-28 DIAGNOSIS — N183 Chronic kidney disease, stage 3 unspecified: Secondary | ICD-10-CM | POA: Diagnosis not present

## 2022-10-28 DIAGNOSIS — Z87891 Personal history of nicotine dependence: Secondary | ICD-10-CM | POA: Insufficient documentation

## 2022-10-28 DIAGNOSIS — Z01812 Encounter for preprocedural laboratory examination: Secondary | ICD-10-CM

## 2022-10-28 DIAGNOSIS — N3592 Unspecified urethral stricture, female: Secondary | ICD-10-CM | POA: Diagnosis not present

## 2022-10-28 DIAGNOSIS — E1122 Type 2 diabetes mellitus with diabetic chronic kidney disease: Secondary | ICD-10-CM | POA: Diagnosis not present

## 2022-10-28 HISTORY — PX: CYSTOSCOPY WITH URETHRAL DILATATION: SHX5125

## 2022-10-28 LAB — GLUCOSE, CAPILLARY
Glucose-Capillary: 109 mg/dL — ABNORMAL HIGH (ref 70–99)
Glucose-Capillary: 129 mg/dL — ABNORMAL HIGH (ref 70–99)

## 2022-10-28 SURGERY — CYSTOSCOPY, WITH URETHRAL DILATION
Anesthesia: General

## 2022-10-28 MED ORDER — LIDOCAINE HCL (PF) 2 % IJ SOLN
INTRAMUSCULAR | Status: AC
  Filled 2022-10-28: qty 5

## 2022-10-28 MED ORDER — FENTANYL CITRATE (PF) 100 MCG/2ML IJ SOLN: 25.0000 ug | INTRAMUSCULAR | Status: DC | PRN

## 2022-10-28 MED ORDER — FENTANYL CITRATE (PF) 100 MCG/2ML IJ SOLN
INTRAMUSCULAR | Status: AC
  Filled 2022-10-28: qty 2

## 2022-10-28 MED ORDER — SODIUM CHLORIDE 0.9 % IV SOLN: INTRAVENOUS | Status: DC

## 2022-10-28 MED ORDER — FENTANYL CITRATE (PF) 100 MCG/2ML IJ SOLN
INTRAMUSCULAR | Status: DC | PRN
  Administered 2022-10-28 (×2): 25 ug via INTRAVENOUS

## 2022-10-28 MED ORDER — ORAL CARE MOUTH RINSE: 15.0000 mL | Freq: Once | OROMUCOSAL | Status: AC

## 2022-10-28 MED ORDER — LIDOCAINE HCL (CARDIAC) PF 100 MG/5ML IV SOSY
PREFILLED_SYRINGE | INTRAVENOUS | Status: DC | PRN
  Administered 2022-10-28: 40 mg via INTRAVENOUS

## 2022-10-28 MED ORDER — SODIUM CHLORIDE 0.9 % IR SOLN
Status: DC | PRN
  Administered 2022-10-28: 1 via INTRAVESICAL

## 2022-10-28 MED ORDER — CEFAZOLIN SODIUM-DEXTROSE 2-4 GM/100ML-% IV SOLN
2.0000 g | INTRAVENOUS | Status: AC
  Administered 2022-10-28: 2 g via INTRAVENOUS

## 2022-10-28 MED ORDER — CHLORHEXIDINE GLUCONATE 0.12 % MT SOLN
OROMUCOSAL | Status: AC
  Filled 2022-10-28: qty 15

## 2022-10-28 MED ORDER — FAMOTIDINE 20 MG PO TABS
ORAL_TABLET | ORAL | Status: AC
  Filled 2022-10-28: qty 1

## 2022-10-28 MED ORDER — FAMOTIDINE 20 MG PO TABS
20.0000 mg | ORAL_TABLET | Freq: Once | ORAL | Status: AC
  Administered 2022-10-28: 20 mg via ORAL

## 2022-10-28 MED ORDER — DROPERIDOL 2.5 MG/ML IJ SOLN: 0.6250 mg | Freq: Once | INTRAMUSCULAR | Status: DC | PRN

## 2022-10-28 MED ORDER — CEFAZOLIN SODIUM-DEXTROSE 2-4 GM/100ML-% IV SOLN
INTRAVENOUS | Status: AC
  Filled 2022-10-28: qty 100

## 2022-10-28 MED ORDER — ONDANSETRON HCL 4 MG/2ML IJ SOLN
INTRAMUSCULAR | Status: DC | PRN
  Administered 2022-10-28: 4 mg via INTRAVENOUS

## 2022-10-28 MED ORDER — CHLORHEXIDINE GLUCONATE 0.12 % MT SOLN
15.0000 mL | Freq: Once | OROMUCOSAL | Status: AC
  Administered 2022-10-28: 15 mL via OROMUCOSAL

## 2022-10-28 MED ORDER — PROPOFOL 500 MG/50ML IV EMUL
INTRAVENOUS | Status: DC | PRN
  Administered 2022-10-28: 150 ug/kg/min via INTRAVENOUS

## 2022-10-28 MED ORDER — ACETAMINOPHEN 10 MG/ML IV SOLN: 1000.0000 mg | Freq: Once | INTRAVENOUS | Status: DC | PRN

## 2022-10-28 MED ORDER — TRIMETHOPRIM 100 MG PO TABS: 100.0000 mg | ORAL_TABLET | Freq: Every day | ORAL | 0 refills | Status: DC

## 2022-10-28 MED ORDER — PROPOFOL 10 MG/ML IV BOLUS
INTRAVENOUS | Status: AC
  Filled 2022-10-28: qty 20

## 2022-10-28 SURGICAL SUPPLY — 15 items
BAG DRN RND TRDRP ANRFLXCHMBR (UROLOGICAL SUPPLIES) ×1
BAG URINE DRAIN 2000ML AR STRL (UROLOGICAL SUPPLIES) ×1 IMPLANT
BRUSH SCRUB EZ 1% IODOPHOR (MISCELLANEOUS) IMPLANT
CATH FOLEY 2W COUNCIL 20FR 5CC (CATHETERS) IMPLANT
CATH FOLEY 2W COUNCIL 5CC 18FR (CATHETERS) IMPLANT
CATH SET URETHRAL DILATOR (CATHETERS) ×1 IMPLANT
GLOVE BIOGEL PI IND STRL 7.5 (GLOVE) ×1 IMPLANT
GOWN STRL REUS W/ TWL XL LVL3 (GOWN DISPOSABLE) ×2 IMPLANT
GOWN STRL REUS W/TWL XL LVL3 (GOWN DISPOSABLE) ×2
GUIDEWIRE STR DUAL SENSOR (WIRE) ×1 IMPLANT
HOLDER FOLEY CATH W/STRAP (MISCELLANEOUS) ×1 IMPLANT
PACK CYSTO AR (MISCELLANEOUS) ×1 IMPLANT
SET CYSTO W/LG BORE CLAMP LF (SET/KITS/TRAYS/PACK) ×1 IMPLANT
SYR 30ML LL (SYRINGE) ×1 IMPLANT
WATER STERILE IRR 500ML POUR (IV SOLUTION) ×1 IMPLANT

## 2022-10-28 NOTE — Anesthesia Preprocedure Evaluation (Addendum)
Anesthesia Evaluation  Patient identified by MRN, date of birth, ID band Patient awake    Reviewed: Allergy & Precautions, NPO status , Patient's Chart, lab work & pertinent test results  History of Anesthesia Complications (+) PONV and history of anesthetic complications  Airway Mallampati: III  TM Distance: <3 FB Neck ROM: full    Dental  (+) Missing, Dental Advidsory Given   Pulmonary neg shortness of breath, neg COPD, neg recent URI, former smoker    + decreased breath sounds      Cardiovascular Exercise Tolerance: Good hypertension, (-) angina (-) Past MI and (-) Cardiac Stents Normal cardiovascular exam(-) dysrhythmias (-) Valvular Problems/Murmurs     Neuro/Psych neg Seizures  Neuromuscular disease  negative psych ROS   GI/Hepatic Neg liver ROS,GERD  ,,  Endo/Other  diabetes, Type 2    Renal/GU Renal disease  negative genitourinary   Musculoskeletal   Abdominal   Peds  Hematology negative hematology ROS (+)   Anesthesia Other Findings Patient reports that they do not think that any food or pills are stuck in their throat at this time.  That she can actively swallow liquids at this time.  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/03/2018: Cataract 09/23/2021: Cellulitis of left lower extremity 05/15/2017: Chest wall abscess No date: Chickenpox No date: Complication of anesthesia No date: Frequent UTI No date: GERD (gastroesophageal reflux disease)     Comment:  RARE No date: Hypertension 2017/2018: Personal history of chemotherapy     Comment:  F/U left breast cancer 2018: Personal history of radiation therapy     Comment:  F/U left breast cancer No date: PONV (postoperative nausea and vomiting) No date: RSD (reflex sympathetic dystrophy) No date: Seasonal allergies No date: Skin cancer No date: Urine incontinence  Past Surgical  History: 2005: ABDOMINAL HYSTERECTOMY     Comment:  total/ Dr Barnabas Lister 2006: BLADDER TUMOR EXCISION 03/13/2016: BREAST BIOPSY; Left     Comment:  INVASIVE MAMMARY CARCINOMA 2018: BREAST LUMPECTOMY; Left 07/14/2016: BREAST LUMPECTOMY WITH SENTINEL LYMPH NODE BIOPSY; Left     Comment:  Procedure: BREAST LUMPECTOMY WITH SENTINEL LYMPH NODE               BX;  Surgeon: Earline Mayotte, MD;  Location: [facility] ORS;              Service: General;  Laterality: Left; 01/02/2021: COLONOSCOPY WITH PROPOFOL; N/A     Comment:  Procedure: COLONOSCOPY WITH PROPOFOL;  Surgeon: Earline Mayotte, MD;  Location: [facility] ENDOSCOPY;  Service:               Endoscopy;  Laterality: N/A; No date: denture surgery No date: OOPHORECTOMY 04/01/2016: PORTACATH PLACEMENT; N/A     Comment:  Procedure: INSERTION PORT-A-CATH;  Surgeon: Earline Mayotte, MD;  Location: [facility] ORS;  Service: General;                Laterality: N/A; 2015: SKIN CANCER EXCISION; Right     Comment:  foot/ Dr Margo Aye No date: TUBAL LIGATION No date: TUMOR EXCISION; Right     Comment:  FOOT  BMI    Body Mass Index: 26.19 kg/m      Reproductive/Obstetrics negative OB ROS  Anesthesia Physical Anesthesia Plan  ASA: 3  Anesthesia Plan: General   Post-op Pain Management:    Induction: Intravenous  PONV Risk Score and Plan: 4 or greater and Propofol infusion and TIVA  Airway Management Planned: Natural Airway and Simple Face Mask  Additional Equipment:   Intra-op Plan:   Post-operative Plan:   Informed Consent: I have reviewed the patients History and Physical, chart, labs and discussed the procedure including the risks, benefits and alternatives for the proposed anesthesia with the patient or authorized representative who has indicated his/her understanding and acceptance.   Patient has DNR.  Discussed DNR with patient and Suspend DNR.   Dental  Advisory Given  Plan Discussed with: Anesthesiologist, CRNA and Surgeon  Anesthesia Plan Comments: (Patient consented for risks of anesthesia including but not limited to:  - adverse reactions to medications - risk of airway placement if required - damage to eyes, teeth, lips or other oral mucosa - nerve damage due to positioning  - sore throat or hoarseness - Damage to heart, brain, nerves, lungs, other parts of body or loss of life  Patient voiced understanding.)       Anesthesia Quick Evaluation

## 2022-10-28 NOTE — Interval H&P Note (Signed)
History and Physical Interval Note:  CV:RRR Lungs: Clear  10/28/2022 7:24 AM  Catherine Tate  has presented today for surgery, with the diagnosis of Urethral Stricture.  The various methods of treatment have been discussed with the patient and family. After consideration of risks, benefits and other options for treatment, the patient has consented to  Procedure(s): CYSTOSCOPY WITH URETHRAL DILATATION (N/A) as a surgical intervention.  The patient's history has been reviewed, patient examined, no change in status, stable for surgery.  I have reviewed the patient's chart and labs.  Questions were answered to the patient's satisfaction.     Catherine Tate C Kree Rafter

## 2022-10-28 NOTE — Discharge Instructions (Addendum)
Cystoscopy patient instructions  Following a cystoscopy, a catheter (a flexible rubber tube) is sometimes left in place to empty the bladder. This may cause some discomfort or a feeling that you need to urinate. Your doctor determines the period of time that the catheter will be left in place. You may have bloody urine for two to three days (Call your doctor if the amount of bleeding increases or does not subside).  You may pass blood clots in your urine, especially if you had a biopsy. It is not unusual to pass small blood clots and have some bloody urine a couple of weeks after your cystoscopy. Again, call your doctor if the bleeding does not subside. You may have: Dysuria (painful urination) Frequency (urinating often) Urgency (strong desire to urinate)  These symptoms are common especially if medicine is instilled into the bladder or a ureteral stent is placed. Avoiding alcohol and caffeine, such as coffee, tea, and chocolate, may help relieve these symptoms. Drink plenty of water, unless otherwise instructed. Your doctor may also prescribe an antibiotic or other medicine to reduce these symptoms.  Cystoscopy results are available soon after the procedure; biopsy results usually take two to four days. Your doctor will discuss the results of your exam with you. Before you go home, you will be given specific instructions for follow-up care. Special Instructions:   If you are going home with a catheter in place do not take a tub bath until removed by your doctor.   You may resume your normal activities after 24 hours. Resume your regular medications.  A low-dose antibiotic was sent to pharmacy for you to take daily for the next few weeks.  Start this medication after you complete your current antibiotic.  Do not drive or operate machinery if you are taking narcotic pain medicine.   Be sure to keep all follow-up appointments with your doctor.   Call Your Doctor If: The catheter is not  draining You have severe pain You are unable to urinate You have a fever over 101 You have severe bleeding          AMBULATORY SURGERY  DISCHARGE INSTRUCTIONS   The drugs that you were given will stay in your system until tomorrow so for the next 24 hours you should not:  Drive an automobile Make any legal decisions Drink any alcoholic beverage   You may resume regular meals tomorrow.  Today it is better to start with liquids and gradually work up to solid foods.  You may eat anything you prefer, but it is better to start with liquids, then soup and crackers, and gradually work up to solid foods.   Please notify your doctor immediately if you have any unusual bleeding, trouble breathing, redness and pain at the surgery site, drainage, fever, or pain not relieved by medication.    Additional Instructions:

## 2022-10-28 NOTE — Transfer of Care (Signed)
Immediate Anesthesia Transfer of Care Note  Patient: Catherine Tate  Procedure(s) Performed: CYSTOSCOPY WITH URETHRAL DILATATION  Patient Location: PACU  Anesthesia Type:MAC  Level of Consciousness: awake  Airway & Oxygen Therapy: Patient Spontanous Breathing  Post-op Assessment: Report given to RN  Post vital signs: Reviewed and stable  Last Vitals:  Vitals Value Taken Time  BP 120/66 10/28/22 0904  Temp 36.4 C 10/28/22 0904  Pulse 96 10/28/22 0909  Resp 20 10/28/22 0909  SpO2 97 % 10/28/22 0909  Vitals shown include unfiled device data.  Last Pain:  Vitals:   10/28/22 0904  TempSrc:   PainSc: 0-No pain         Complications: No notable events documented.

## 2022-10-28 NOTE — Op Note (Signed)
   Preoperative diagnosis:  Urethral stricture  Postoperative diagnosis:  Urethral stricture  Procedure: Dilation urethral stricture Cystoscopy  Surgeon: Riki Altes, MD  Anesthesia: MAC  Complications: None  Intraoperative findings:  Mid urethral stricture tight to an 8 [place] dilator Bladder mucosa with scattered hyperemia and resolving areas cystitis cystica; no solid or papillary lesions  EBL: Minimal  Specimens: None  Indication: Catherine Tate is a 82 y.o. with a history of urinary retention and urethral stricture requiring dilation/catheter placement in the ED 07/18/22.  Recurrent stricture noted on attempt at office cystoscopy.  After reviewing the management options for treatment, she elected to proceed with the above surgical procedure(s). We have discussed the potential benefits and risks of the procedure, side effects of the proposed treatment, the likelihood of the patient achieving the goals of the procedure, and any potential problems that might occur during the procedure or recuperation. Informed consent has been obtained.  Description of procedure:  The patient was taken to the operating room and IV sedation was obtained by anesthesia.  The patient was placed in the dorsal lithotomy position, prepped and draped in the usual sterile fashion, and preoperative antibiotics were administered. A preoperative time-out was performed.   In mid urethral region resistance was met with a 12 [place] Walther sound which would not advance.  A 0.038 guidewire was placed through the urethra and advanced into the bladder.  The urethra was tight to an 8 [place] disposable dilator and serial dilation was performed up to 20 [place].  A 17 French cystoscope sheath with obturator was then placed per urethra.  Panendoscopy was performed with findings as described above.  No urethral lesions were noted.  A 20 French [place] catheter was unable to be placed however an 66 [place] council  catheter was placed without difficulty and the balloon was inflated with 10 mL of sterile water.  The catheter was irrigated with return of clear effluent and placed to gravity drainage.  She was transported to the PACU in stable condition.  Plan: Indwelling Foley catheter x 1 week with office follow-up for catheter removal Postop follow-up scheduled 11/04/2022 Low-dose antibiotic prophylaxis trimethoprim 100 mg daily x 4 weeks   Riki Altes, M.D.

## 2022-10-28 NOTE — Anesthesia Postprocedure Evaluation (Signed)
Anesthesia Post Note  Patient: Catherine Tate  Procedure(s) Performed: CYSTOSCOPY WITH URETHRAL DILATATION  Patient location during evaluation: PACU Anesthesia Type: General Level of consciousness: awake and alert Pain management: pain level controlled Vital Signs Assessment: post-procedure vital signs reviewed and stable Respiratory status: spontaneous breathing, nonlabored ventilation and respiratory function stable Cardiovascular status: blood pressure returned to baseline and stable Postop Assessment: no apparent nausea or vomiting Anesthetic complications: no   No notable events documented.   Last Vitals:  Vitals:   10/28/22 0930 10/28/22 0938  BP: (!) 149/81 (!) 142/85  Pulse: 86 84  Resp: 19 19  Temp: (!) 36.3 C 36.9 C  SpO2: 98% 98%    Last Pain:  Vitals:   10/28/22 0938  TempSrc: Temporal  PainSc: 0-No pain                 Foye Deer

## 2022-10-29 ENCOUNTER — Encounter: Payer: Self-pay | Admitting: Urology

## 2022-11-04 ENCOUNTER — Ambulatory Visit (INDEPENDENT_AMBULATORY_CARE_PROVIDER_SITE_OTHER): Payer: PPO | Admitting: Physician Assistant

## 2022-11-04 DIAGNOSIS — N3592 Unspecified urethral stricture, female: Secondary | ICD-10-CM

## 2022-11-04 NOTE — Progress Notes (Addendum)
Catheter Removal  Patient is present today for a catheter removal.  8ml of water was drained from the balloon. A 18FR foley cath was removed from the bladder, no complications were noted. Patient tolerated well.  Performed by: Ples Specter CMA   Follow up: Return in about 4 weeks (around 12/02/2022) for Symptom recheck with PVR.

## 2022-11-11 ENCOUNTER — Encounter: Payer: Self-pay | Admitting: Pharmacist

## 2022-11-11 NOTE — Progress Notes (Signed)
Pharmacy Quality Measure Review  This patient is appearing on a report for being at risk of failing the adherence measure for hypertension (ACEi/ARB) medications this calendar year.   Medication: losartan 100 mg Last fill date: 10/20/22 for 90 day supply  Insurance report was not up to date. No action needed at this time.   Catie Eppie Gibson, PharmD, BCACP, CPP Clinical Pharmacist [facility] [phone]

## 2022-11-24 ENCOUNTER — Other Ambulatory Visit: Payer: Self-pay | Admitting: Urology

## 2022-12-01 ENCOUNTER — Ambulatory Visit
Admission: RE | Admit: 2022-12-01 | Discharge: 2022-12-01 | Disposition: A | Discharge status: Home / Self Care | Payer: PPO | Source: Ambulatory Visit | Attending: Medical Oncology | Admitting: Medical Oncology

## 2022-12-01 DIAGNOSIS — Z780 Asymptomatic menopausal state: Secondary | ICD-10-CM

## 2022-12-01 DIAGNOSIS — C50412 Malignant neoplasm of upper-outer quadrant of left female breast: Secondary | ICD-10-CM | POA: Insufficient documentation

## 2022-12-01 DIAGNOSIS — Z170 Estrogen receptor positive status [ER+]: Secondary | ICD-10-CM | POA: Insufficient documentation

## 2022-12-01 DIAGNOSIS — M8580 Other specified disorders of bone density and structure, unspecified site: Secondary | ICD-10-CM | POA: Diagnosis not present

## 2022-12-01 DIAGNOSIS — Z79811 Long term (current) use of aromatase inhibitors: Secondary | ICD-10-CM

## 2022-12-01 DIAGNOSIS — M8589 Other specified disorders of bone density and structure, multiple sites: Secondary | ICD-10-CM | POA: Diagnosis not present

## 2022-12-01 DIAGNOSIS — Z1231 Encounter for screening mammogram for malignant neoplasm of breast: Secondary | ICD-10-CM | POA: Insufficient documentation

## 2022-12-02 ENCOUNTER — Inpatient Hospital Stay: Payer: PPO | Attending: Oncology | Admitting: Oncology

## 2022-12-02 ENCOUNTER — Encounter: Payer: Self-pay | Admitting: Oncology

## 2022-12-02 VITALS — BP 148/86 | HR 95 | Temp 96.7°F | Resp 16 | Ht 66.0 in | Wt 157.0 lb

## 2022-12-02 DIAGNOSIS — Z923 Personal history of irradiation: Secondary | ICD-10-CM | POA: Insufficient documentation

## 2022-12-02 DIAGNOSIS — Z79811 Long term (current) use of aromatase inhibitors: Secondary | ICD-10-CM | POA: Diagnosis not present

## 2022-12-02 DIAGNOSIS — Z79899 Other long term (current) drug therapy: Secondary | ICD-10-CM | POA: Insufficient documentation

## 2022-12-02 DIAGNOSIS — Z87891 Personal history of nicotine dependence: Secondary | ICD-10-CM | POA: Insufficient documentation

## 2022-12-02 DIAGNOSIS — Z170 Estrogen receptor positive status [ER+]: Secondary | ICD-10-CM | POA: Insufficient documentation

## 2022-12-02 DIAGNOSIS — C50412 Malignant neoplasm of upper-outer quadrant of left female breast: Secondary | ICD-10-CM | POA: Diagnosis not present

## 2022-12-02 DIAGNOSIS — M8580 Other specified disorders of bone density and structure, unspecified site: Secondary | ICD-10-CM | POA: Insufficient documentation

## 2022-12-02 NOTE — Progress Notes (Signed)
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  INTERVAL HISTORY: Patient returns to clinic today for routine yearly evaluation and discussion of her mammogram and bone mineral density results.  She discontinued letrozole approximately 1 year ago.  She continues to take Fosamax without significant side effects.  She currently feels well and is asymptomatic. She has no neurologic complaints.  She denies any recent fevers or illnesses.  She denies any chest pain, shortness of breath, cough, or hemoptysis.  She denies any nausea, vomiting, constipation, or diarrhea. She has no urinary complaints.  Patient offers no specific complaints today.  REVIEW OF SYSTEMS:   Review of Systems  Constitutional: Negative.  Negative for fever, malaise/fatigue and weight loss.  HENT:  Negative for sinus pain.   Respiratory: Negative.  Negative for cough and shortness of breath.   Cardiovascular: Negative.  Negative for chest pain and leg swelling.  Gastrointestinal: Negative.  Negative for abdominal pain, constipation and nausea.  Genitourinary:  Negative for hematuria and urgency.  Musculoskeletal: Negative.   Skin: Negative.  Negative for rash.  Neurological: Negative.  Negative for sensory change, weakness and headaches.  Psychiatric/Behavioral: Negative.  The patient is not nervous/anxious and does not have insomnia.      As per HPI. Otherwise, a complete review of systems is negative.  PAST MEDICAL HISTORY: Past Medical History:  Diagnosis Date   Arthritis    Cancer (HCC) 03/13/2016   INVASIVE MAMMARY CARCINOMA   Cataract  02/03/2018   Cellulitis of left lower extremity 09/23/2021   Chest wall abscess 05/15/2017   Chickenpox    Chronic kidney disease, stage 3 (HCC)    Complication of anesthesia    Frequent UTI    GERD (gastroesophageal reflux disease)    RARE   Hyperlipidemia    Hypertension    Osteoporosis    Personal history of chemotherapy 2017/2018   F/U left breast cancer   Personal history of radiation therapy 2018   F/U left breast cancer   PONV (postoperative nausea and vomiting)    RSD (reflex sympathetic dystrophy)    Seasonal allergies    Skin cancer    Type 2 diabetes mellitus with hyperglycemia (HCC)    Urine incontinence     PAST SURGICAL HISTORY: Past Surgical History:  Procedure Laterality Date   ABDOMINAL HYSTERECTOMY  2005   total/ Dr Barnabas Lister   BLADDER TUMOR EXCISION  2006   BREAST BIOPSY Left 03/13/2016   INVASIVE MAMMARY CARCINOMA   BREAST LUMPECTOMY Left 2018   BREAST LUMPECTOMY WITH SENTINEL LYMPH NODE BIOPSY Left 07/14/2016   Procedure: BREAST LUMPECTOMY WITH SENTINEL LYMPH NODE BX;  Surgeon: Earline Mayotte, MD;  Location: [facility] ORS;  Service: General;  Laterality: Left;   COLONOSCOPY  2023   COLONOSCOPY WITH PROPOFOL N/A 01/02/2021   Procedure: COLONOSCOPY WITH PROPOFOL;  Surgeon: Earline Mayotte, MD;  Location: [facility];  Service: Endoscopy;  Laterality: N/A;   CYSTOSCOPY WITH URETHRAL DILATATION N/A 10/28/2022   Procedure: CYSTOSCOPY WITH URETHRAL DILATATION;  Surgeon: Riki Altes, MD;  Location: [facility];  Service: Urology;  Laterality: N/A;   denture surgery     ESOPHAGOGASTRODUODENOSCOPY (EGD) WITH PROPOFOL N/A 07/19/2022  Procedure: ESOPHAGOGASTRODUODENOSCOPY (EGD) WITH PROPOFOL;  Surgeon: Toledo, Boykin Nearing, MD;  Location: [facility];  Service: Gastroenterology;  Laterality: N/A;   PORT-A-CATH REMOVAL     PORTACATH PLACEMENT N/A 04/01/2016   Procedure: INSERTION PORT-A-CATH;  Surgeon: Earline Mayotte, MD;  Location: [facility];  Service:  General;  Laterality: N/A;   SKIN CANCER EXCISION Right 2015   foot/ Dr Margo Aye   TUBAL LIGATION     TUMOR EXCISION Right    FOOT    FAMILY HISTORY: Family History  Problem Relation Age of Onset   Heart disease Mother    Transient ischemic attack Mother    Emphysema Father    Breast cancer Sister    Breast cancer Cousin        paternal   Kidney cancer Neg Hx    Bladder Cancer Neg Hx     ADVANCED DIRECTIVES (Y/N):  N  HEALTH MAINTENANCE: Social History   Tobacco Use   Smoking status: Former    Current packs/day: 0.00    Average packs/day: 1 pack/day for 20.0 years (20.0 ttl pk-yrs)    Types: Cigarettes    Start date: 04/14/1974    Quit date: 04/14/1994    Years since quitting: 28.6   Smokeless tobacco: Never  Vaping Use   Vaping status: Never Used  Substance Use Topics   Alcohol use: No    Alcohol/week: 0.0 standard drinks of alcohol   Drug use: No     Colonoscopy:  PAP:  Bone density:  Lipid panel:  Allergies  Allergen Reactions   Bee Pollen Hives   Mosquito (Diagnostic) Hives   Other Other (See Comments)    Uncoded Allergy. Allergen: VICRYL  (90% glycolide and 10% L-lactide [polyglactin-- synthetic absorbable sterile surgical suture]), Other Reaction: Infection   Gabapentin Swelling   Mosquito (Culex Pipiens) Allergy Skin Test Hives   Norvasc [Amlodipine Besylate] Swelling    TO FEET   Lisinopril Cough    Current Outpatient Medications  Medication Sig Dispense Refill   acetaminophen (TYLENOL) 500 MG tablet Take 1,000 mg by mouth every 6 (six) hours as needed for moderate pain.      alendronate (FOSAMAX) 70 MG tablet TAKE ONE TABLET (70 MG TOTAL) BY MOUTH ONCE A WEEK. TAKE WITH A FULL GLASS OF WATER ON AN EMPTY STOMACH (Patient taking differently: Take 70 mg by mouth once a week. Sunday) 12 tablet 3   Ascorbic Acid (VITAMIN C) 1000 MG tablet Take 1,000 mg by mouth daily.     atorvastatin (LIPITOR) 10 MG tablet TAKE ONE TABLET BY MOUTH ONCE A DAY FOR  CHOLSTEROL. (Patient taking differently: Take 10 mg by mouth daily.) 90 tablet 2   Calcium Carb-Cholecalciferol (CALCIUM/VITAMIN D PO) Take 1,200 mg by mouth.     cetirizine (ZYRTEC) 10 MG tablet Take 1 tablet (10 mg total) by mouth daily. (Patient taking differently: Take 10 mg by mouth daily as needed.) 30 tablet 11   CRANBERRY PO Take 1 capsule by mouth 2 (two) times daily.     losartan (COZAAR) 100 MG tablet TAKE ONE TABLET (100 MG TOTAL) BY MOUTH DAILY. FOR BLOOD PRESSURE. 90 tablet 1   Probiotic Product (PROBIOTIC PO) Take 1 tablet by mouth daily.     trimethoprim (TRIMPEX) 100 MG tablet Take 1 tablet (100 mg total) by mouth daily. 30 tablet 0   fluticasone (FLONASE) 50 MCG/ACT nasal spray Place 2 sprays into both nostrils daily. (Patient not taking: Reported on 12/02/2022) 16 g 6   sulfamethoxazole-trimethoprim (BACTRIM  DS) 800-160 MG tablet Take 1 tablet by mouth every 12 (twelve) hours. (Patient not taking: Reported on 12/02/2022) 14 tablet 0   No current facility-administered medications for this visit.    OBJECTIVE: Vitals:   12/02/22 0938  BP: (!) 148/86  Pulse: 95  Resp: 16  Temp: (!) 96.7 F (35.9 C)  SpO2: 99%     Body mass index is 25.34 kg/m.    ECOG FS:0 - Asymptomatic  General: Well-developed, well-nourished, no acute distress. Eyes: Pink conjunctiva, anicteric sclera. HEENT: Normocephalic, moist mucous membranes. Lungs: No audible wheezing or coughing. Heart: Regular rate and rhythm. Abdomen: Soft, nontender, no obvious distention. Musculoskeletal: No edema, cyanosis, or clubbing. Neuro: Alert, answering all questions appropriately. Cranial nerves grossly intact. Skin: No rashes or petechiae noted. Psych: Normal affect.  LAB RESULTS:  Lab Results  Component Value Date   NA 135 09/19/2022   K 4.5 09/19/2022   CL 106 09/19/2022   CO2 22 09/19/2022   GLUCOSE 156 (H) 09/19/2022   BUN 16 09/19/2022   CREATININE 1.27 (H) 09/19/2022   CALCIUM 9.3 09/19/2022    PROT 7.6 07/17/2022   ALBUMIN 4.2 07/17/2022   AST 37 07/17/2022   ALT 34 07/17/2022   ALKPHOS 54 07/17/2022   BILITOT 1.1 07/17/2022   GFRNONAA >60 07/20/2022   GFRAA 58 (L) 12/22/2016    Lab Results  Component Value Date   WBC 6.8 07/25/2022   NEUTROABS 5.3 07/16/2022   HGB 12.3 07/25/2022   HCT 35.5 07/25/2022   MCV 91.5 07/25/2022   PLT 251 07/25/2022     STUDIES: DG Bone Density  Result Date: 12/01/2022 EXAM: DUAL X-RAY ABSORPTIOMETRY (DXA) FOR BONE MINERAL DENSITY IMPRESSION: Your patient Ruqayya Thevenin completed a BMD test on 12/01/2022 using the [employer] iDXA DXA System (software version: 14.10) manufactured by [employer]. The following summarizes the results of our evaluation. Technologist: SCE PATIENT BIOGRAPHICAL: Name: Dorisann, Ballinger Patient ID: [id number] Birth Date: 1940/11/03 Height: 65.0 in. Gender: Female Exam Date: 12/01/2022 Weight: 155.3 lbs. Indications: Height Loss, Advanced Age, Hysterectomy, Family Hx of Osteoporosis, Family Hist. (Parent hip fracture), History of Fracture (Adult), Caucasian, Diabetic, Oophorectomy Bilateral, Postmenopausal, Previous Chemo and Radiation, Family History of Fracture, History of Breast Cancer, Parent Hip Fracture Fractures: Left forearm Treatments: Fosamax, ZYRTEC, calcium w/ vit D DENSITOMETRY RESULTS: Site          Region     Measured Date Measured Age WHO Classification Young Adult T-score BMD         %Change vs. Previous Significant Change (*) AP Spine L1-L3 12/01/2022 80.8 Osteopenia -1.8 0.957 g/cm2 0.9% - AP Spine L1-L3 11/21/2020 78.8 Osteopenia -1.9 0.948 g/cm2 -0.4% - AP Spine L1-L3 11/21/2019 77.8 Osteopenia -1.9 0.952 g/cm2 4.6% Yes AP Spine L1-L3 11/18/2018 76.7 Osteopenia -2.2 0.910 g/cm2 -1.0% - AP Spine L1-L3 11/16/2017 75.7 Osteopenia -2.1 0.919 g/cm2 0.0% - AP Spine L1-L3 11/13/2016 74.7 Osteopenia -2.1 0.919 g/cm2 - - DualFemur Neck Left 12/01/2022 80.8 Osteopenia -2.2 0.735 g/cm2 0.5% - DualFemur Neck Left  11/25/2021 79.8 Osteopenia -2.2 0.731 g/cm2 1.5% - DualFemur Neck Left 11/21/2020 78.8 Osteopenia -2.3 0.720 g/cm2 -9.1% Yes DualFemur Neck Left 11/21/2019 77.8 Osteopenia -1.8 0.792 g/cm2 7.5% Yes DualFemur Neck Left 11/18/2018 76.7 Osteopenia -2.2 0.737 g/cm2 6.5% - DualFemur Neck Left 11/16/2017 75.7 Osteoporosis -2.5 0.692 g/cm2 -4.4% - DualFemur Neck Left 11/13/2016 74.7 Osteopenia -2.3 0.724 g/cm2 - - DualFemur Total Mean 12/01/2022 80.8 Osteopenia -1.5 0.817 g/cm2 -0.5% - DualFemur Total Mean 11/25/2021 79.8 Osteopenia -  1.5 0.821 g/cm2 -0.6% - DualFemur Total Mean 11/21/2020 78.8 Osteopenia -1.4 0.826 g/cm2 -0.2% - DualFemur Total Mean 11/21/2019 77.8 Osteopenia -1.4 0.828 g/cm2 -0.5% - DualFemur Total Mean 11/18/2018 76.7 Osteopenia -1.4 0.832 g/cm2 1.3% - DualFemur Total Mean 11/16/2017 75.7 Osteopenia -1.5 0.821 g/cm2 -0.6% - DualFemur Total Mean 11/13/2016 74.7 Osteopenia -1.4 0.826 g/cm2 - - Right Forearm Radius 33% 12/01/2022 80.8 Osteopenia -2.3 0.676 g/cm2 -1.9% - Right Forearm Radius 33% 11/25/2021 79.8 Osteopenia -2.1 0.689 g/cm2 - - ASSESSMENT: The BMD measured at Forearm Radius 33% is 0.676 g/cm2 with a T-score of -2.3. This patient is considered osteopenic according to World Health Organization [facility]) criteria. The scan quality is good. L-4 was excluded due to degenerative changes. Compared with prior study, there has been no significant change in the spine. Compared with prior study, there has been no significant change in the total hip. World Science writer [facility]) criteria for post-menopausal, Caucasian Women: Normal:                   T-score at or above -1 SD Osteopenia/low bone mass: T-score between -1 and -2.5 SD Osteoporosis:             T-score at or below -2.5 SD RECOMMENDATIONS: 1. All patients should optimize calcium and vitamin D intake. 2. Consider FDA-approved medical therapies in postmenopausal women and men aged 11 years and older, based on the following: a. A hip or  vertebral(clinical or morphometric) fracture b. T-score < -2.5 at the femoral neck or spine after appropriate evaluation to exclude secondary causes c. Low bone mass (T-score between -1.0 and -2.5 at the femoral neck or spine) and a 10-year probability of a hip fracture > 3% or a 10-year probability of a major osteoporosis-related fracture > 20% based on the [place]-adapted WHO algorithm 3. Clinician judgment and/or patient preferences may indicate treatment for people with 10-year fracture probabilities above or below these levels FOLLOW-UP: People with diagnosed cases of osteoporosis or at high risk for fracture should have regular bone mineral density tests. For patients eligible for Medicare, routine testing is allowed once every 2 years. The testing frequency can be increased to one year for patients who have rapidly progressing disease, those who are receiving or discontinuing medical therapy to restore bone mass, or have additional risk factors. I have reviewed this report, and agree with the above findings. [facility] Dear Maralyn Sago Azucena Kuba, Your patient SAHRA BOLAN completed a FRAX assessment on 12/01/2022 using the [facility] iDXA DXA System (analysis version: 14.10) manufactured by [employer]. The following summarizes the results of our evaluation. PATIENT BIOGRAPHICAL: Name: Lilliah, Eddie Patient ID: [id number] Birth Date: [birth year] Height:    65.0 in. Gender:     Female    [age]       Weight:    155.3 lbs. Ethnicity:  White                            Exam Date: 12/01/2022 FRAX* RESULTS:  (version: 3.5) 10-year Probability of Fracture1 Major Osteoporotic Fracture2 Hip Fracture 41.6% 27.4% Population: [place] (Caucasian) Risk Factors: Family Hist. (Parent hip fracture), History of Fracture (Adult) Based on Femur (Left) Neck BMD 1 -The 10-year probability of fracture may be lower than reported if the patient has received treatment. 2 -Major Osteoporotic Fracture: Clinical Spine, Forearm,  Hip or Shoulder *FRAX is a Armed forces logistics/support/administrative officer of the [employer] of  [employer], a World Science writer (WHO) Collaborating Centre. ASSESSMENT: The probability of a major osteoporotic fracture is 41.6% within the next ten years. The probability of a hip fracture is 27.4% within the next ten years. . Electronically Signed   By: Baird Lyons M.D.   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 She then underwent a lumpectomy on July 14, 2016 and completed adjuvant XRT.  Patient completed 5 years of letrozole in June 2023.  Continue letrozole for a minimum of 5 years completing treatment in June 2023.  Mammogram results from December 01, 2022 were reported as BI-RADS 1.  After discussion with the patient, is agreed upon that no further follow-up is necessary and primary care can continue to order her screening mammograms.  Osteopenia: Patient's most recent bone mineral density on December 01, 2022 reported T-score of -2.3 which is unchanged from previous.  Continue Fosamax, calcium, and vitamin D.  Repeat imaging and follow-up as per primary care as above.     I spent a total of 20 minutes reviewing chart data, face-to-face evaluation with the patient, counseling and coordination of care as detailed above.   Patient expressed understanding and was in agreement with this plan. She also understands that She can call clinic at any time with any questions, concerns, or complaints.     Cancer Staging  History of breast cancer Staging form: Breast, AJCC 7th Edition - Clinical stage from 03/19/2016: Stage IIA (T2, N0, M0) - Signed by Jeralyn Ruths, MD on  04/06/2016 Laterality: Right Estrogen receptor status: Positive Progesterone receptor status: Positive HER2 status: Negative - Pathologic stage from 07/27/2016: Stage IIA (T2, N0, cM0) - Signed by Jeralyn Ruths, MD on 07/27/2016 Laterality: Left Estrogen receptor status: Positive Progesterone receptor status: Positive HER2 status: Negative    Jeralyn Ruths, MD 12/02/22 10:20 AM

## 2022-12-05 ENCOUNTER — Ambulatory Visit: Payer: PPO | Admitting: Physician Assistant

## 2022-12-05 VITALS — BP 153/81 | HR 102 | Ht 66.0 in | Wt 157.0 lb

## 2022-12-05 DIAGNOSIS — R8271 Bacteriuria: Secondary | ICD-10-CM

## 2022-12-05 DIAGNOSIS — R8281 Pyuria: Secondary | ICD-10-CM | POA: Diagnosis not present

## 2022-12-05 DIAGNOSIS — Z87440 Personal history of urinary (tract) infections: Secondary | ICD-10-CM | POA: Diagnosis not present

## 2022-12-05 DIAGNOSIS — N3592 Unspecified urethral stricture, female: Secondary | ICD-10-CM | POA: Diagnosis not present

## 2022-12-05 DIAGNOSIS — N390 Urinary tract infection, site not specified: Secondary | ICD-10-CM | POA: Diagnosis not present

## 2022-12-05 LAB — URINALYSIS, COMPLETE
Bilirubin, UA: NEGATIVE
Glucose, UA: NEGATIVE
Ketones, UA: NEGATIVE
Nitrite, UA: NEGATIVE
Protein,UA: NEGATIVE
RBC, UA: NEGATIVE
Specific Gravity, UA: 1.02 (ref 1.005–1.030)
Urobilinogen, Ur: 0.2 mg/dL (ref 0.2–1.0)
pH, UA: 7.5 (ref 5.0–7.5)

## 2022-12-05 LAB — BLADDER SCAN AMB NON-IMAGING: Scan Result: 0

## 2022-12-05 LAB — MICROSCOPIC EXAMINATION

## 2022-12-05 NOTE — Progress Notes (Signed)
12/05/2022 9:15 AM   [name] [birth year] [id number]  CC: Chief Complaint  Patient presents with   Follow-up   HPI: Catherine Tate is a 82 y.o. 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 Lonna Cobb on 10/28/2022 who presents today for postop follow-up.  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  She was placed on 4 weeks of suppressive daily trimethoprim postoperatively, which she completed last week.  Today she reports mild dysuria with initiation that started several days before she completed trimethoprim.  She would like to resume suppressive antibiotics.  She has a history of constant urinary leakage, for which she wears pads.  She does admit to some vulvar moisture dermatitis and wonders about barrier creams.  In-office UA today positive for 1+ leukocytes; urine microscopy with 6-10 WBCs/HPF and many bacteria.  PVR 0 mL.  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   Cataract 02/03/2018   Cellulitis of left lower extremity 09/23/2021   Chest wall abscess 05/15/2017   Chickenpox    Chronic kidney disease, stage 3 (HCC)    Complication of anesthesia    Frequent UTI    GERD (gastroesophageal reflux disease)    RARE   Hyperlipidemia    Hypertension    Osteoporosis    Personal history of chemotherapy 2017/2018   F/U left breast cancer   Personal history of radiation therapy 2018   F/U left breast cancer   PONV (postoperative nausea and vomiting)    RSD (reflex sympathetic dystrophy)    Seasonal allergies    Skin cancer    Type 2 diabetes mellitus with hyperglycemia (HCC)    Urine incontinence     Surgical History: Past Surgical History:  Procedure  Laterality Date   ABDOMINAL HYSTERECTOMY  2005   total/ Dr Barnabas Lister   BLADDER TUMOR EXCISION  2006   BREAST BIOPSY Left 03/13/2016   INVASIVE MAMMARY CARCINOMA   BREAST LUMPECTOMY Left 2018   BREAST LUMPECTOMY WITH SENTINEL LYMPH NODE BIOPSY Left 07/14/2016   Procedure: BREAST LUMPECTOMY WITH SENTINEL LYMPH NODE BX;  Surgeon: Earline Mayotte, MD;  Location: [facility];  Service: General;  Laterality: Left;   COLONOSCOPY  2023   COLONOSCOPY WITH PROPOFOL N/A 01/02/2021   Procedure: COLONOSCOPY WITH PROPOFOL;  Surgeon: Earline Mayotte, MD;  Location: [facility];  Service: Endoscopy;  Laterality: N/A;   CYSTOSCOPY WITH URETHRAL DILATATION N/A 10/28/2022   Procedure: CYSTOSCOPY WITH URETHRAL DILATATION;  Surgeon: Riki Altes, MD;  Location: [facility];  Service: Urology;  Laterality: N/A;   denture surgery     ESOPHAGOGASTRODUODENOSCOPY (EGD) WITH PROPOFOL N/A 07/19/2022   Procedure: ESOPHAGOGASTRODUODENOSCOPY (EGD) WITH PROPOFOL;  Surgeon: Toledo, Boykin Nearing, MD;  Location: [facility];  Service: Gastroenterology;  Laterality: N/A;   PORT-A-CATH REMOVAL     PORTACATH PLACEMENT N/A 04/01/2016   Procedure: INSERTION PORT-A-CATH;  Surgeon: Earline Mayotte, MD;  Location: [facility];  Service: General;  Laterality: N/A;   SKIN CANCER EXCISION Right 2015   foot/ Dr Margo Aye   TUBAL LIGATION     TUMOR EXCISION Right    FOOT    Home Medications:  Allergies as of 12/05/2022       Reactions   Bee Pollen Hives  Mosquito (diagnostic) Hives   Other Other (See Comments)   Uncoded Allergy. Allergen: VICRYL  (90% glycolide and 10% L-lactide [polyglactin-- synthetic absorbable sterile surgical suture]), Other Reaction: Infection   Gabapentin Swelling   Mosquito (culex Pipiens) Allergy Skin Test Hives   Norvasc [amlodipine Besylate] Swelling   TO FEET   Lisinopril Cough        Medication List        Accurate as of December 05, 2022  9:15 AM. If you have any questions, ask your  nurse or doctor.          STOP taking these medications    fluticasone 50 MCG/ACT nasal spray Commonly known as: FLONASE   sulfamethoxazole-trimethoprim 800-160 MG tablet Commonly known as: BACTRIM DS       TAKE these medications    acetaminophen 500 MG tablet Commonly known as: TYLENOL Take 1,000 mg by mouth every 6 (six) hours as needed for moderate pain.   alendronate 70 MG tablet Commonly known as: FOSAMAX TAKE ONE TABLET (70 MG TOTAL) BY MOUTH ONCE A WEEK. TAKE WITH A FULL GLASS OF WATER ON AN EMPTY STOMACH What changed: See the new instructions.   atorvastatin 10 MG tablet Commonly known as: LIPITOR TAKE ONE TABLET BY MOUTH ONCE A DAY FOR CHOLSTEROL. What changed: See the new instructions.   CALCIUM/VITAMIN D PO Take 1,200 mg by mouth.   cetirizine 10 MG tablet Commonly known as: ZYRTEC Take 1 tablet (10 mg total) by mouth daily. What changed:  when to take this reasons to take this   CRANBERRY PO Take 1 capsule by mouth 2 (two) times daily.   losartan 100 MG tablet Commonly known as: COZAAR TAKE ONE TABLET (100 MG TOTAL) BY MOUTH DAILY. FOR BLOOD PRESSURE.   PROBIOTIC PO Take 1 tablet by mouth daily.   trimethoprim 100 MG tablet Commonly known as: TRIMPEX Take 1 tablet (100 mg total) by mouth daily.   vitamin C 1000 MG tablet Take 1,000 mg by mouth daily.        Allergies:  Allergies  Allergen Reactions   Bee Pollen Hives   Mosquito (Diagnostic) Hives   Other Other (See Comments)    Uncoded Allergy. Allergen: VICRYL  (90% glycolide and 10% L-lactide [polyglactin-- synthetic absorbable sterile surgical suture]), Other Reaction: Infection   Gabapentin Swelling   Mosquito (Culex Pipiens) Allergy Skin Test Hives   Norvasc [Amlodipine Besylate] Swelling    TO FEET   Lisinopril Cough    Family History: Family History  Problem Relation Age of Onset   Heart disease Mother    Transient ischemic attack Mother    Emphysema Father     Breast cancer Sister    Breast cancer Cousin        paternal   Kidney cancer Neg Hx    Bladder Cancer Neg Hx     Social History:   reports that she quit smoking about 28 years ago. Her smoking use included cigarettes. She started smoking about 48 years ago. She has a 20 pack-year smoking history. She has never used smokeless tobacco. She reports that she does not drink alcohol and does not use drugs.  Physical Exam: BP (!) 153/81   Pulse (!) 102   Ht 5\' 6"  (1.676 m)   Wt 157 lb (71.2 kg)   BMI 25.34 kg/m   Constitutional:  Alert and oriented, no acute distress, nontoxic appearing HEENT: [place], AT Cardiovascular: No clubbing, cyanosis, or edema Respiratory: Normal respiratory effort, no increased work  of breathing Skin: No rashes, bruises or suspicious lesions Neurologic: Grossly intact, no focal deficits, moving all 4 extremities Psychiatric: Normal mood and affect  Laboratory Data: Results for orders placed or performed in visit on 12/05/22  BLADDER SCAN AMB NON-IMAGING  Result Value Ref Range   Scan Result 0 ml    Assessment & Plan:   1. Recurrent UTI UA today with pyuria and bacteriuria, will send for culture and treat per results.  Will also resume suppressive antibiotics based on results.  I do think she would benefit from topical vaginal estrogen cream, but will need to get clearance from Dr. Orlie Dakin prior due to her breast cancer history.  She is in agreement with this plan. - Urinalysis, Complete - CULTURE, URINE COMPREHENSIVE  2. Stricture of female urethra, unspecified stricture type Emptying appropriately.  Given frequency of recurrence, she may benefit from Optilume dilation in the future.  Will continue to monitor.  Will also discuss consideration of CIC with Dr. Lonna Cobb. - BLADDER SCAN AMB NON-IMAGING  Return for Will call with results, Will call to start suppressive abx per cx results.  Carman Ching, PA-C  [facility] [address], [address] [place], [place] [address] [phone]

## 2022-12-09 ENCOUNTER — Ambulatory Visit: Payer: PPO | Admitting: Primary Care

## 2022-12-09 ENCOUNTER — Telehealth: Payer: Self-pay

## 2022-12-09 LAB — CULTURE, URINE COMPREHENSIVE

## 2022-12-09 NOTE — Telephone Encounter (Signed)
LVM for pt to return call to schedule an appointment for CIC teaching per Dr. Lonna Cobb to reduce her risk of stricture recurrence

## 2022-12-10 ENCOUNTER — Other Ambulatory Visit: Payer: Self-pay

## 2022-12-10 DIAGNOSIS — N390 Urinary tract infection, site not specified: Secondary | ICD-10-CM

## 2022-12-10 MED ORDER — CEFDINIR 300 MG PO CAPS
300.0000 mg | ORAL_CAPSULE | Freq: Two times a day (BID) | ORAL | 0 refills | Status: AC
Start: 2022-12-10 — End: 2022-12-15

## 2022-12-16 ENCOUNTER — Ambulatory Visit (INDEPENDENT_AMBULATORY_CARE_PROVIDER_SITE_OTHER): Payer: PPO | Admitting: Primary Care

## 2022-12-16 ENCOUNTER — Encounter: Payer: Self-pay | Admitting: Primary Care

## 2022-12-16 VITALS — BP 136/82 | HR 100 | Temp 98.1°F | Ht 66.0 in | Wt 157.0 lb

## 2022-12-16 DIAGNOSIS — E1165 Type 2 diabetes mellitus with hyperglycemia: Secondary | ICD-10-CM

## 2022-12-16 DIAGNOSIS — Z23 Encounter for immunization: Secondary | ICD-10-CM | POA: Diagnosis not present

## 2022-12-16 LAB — MICROALBUMIN / CREATININE URINE RATIO
Creatinine,U: 60.6 mg/dL
Microalb Creat Ratio: 1.2 mg/g (ref 0.0–30.0)
Microalb, Ur: <0.7 mg/dL (ref 0.0–1.9)

## 2022-12-16 LAB — POCT GLYCOSYLATED HEMOGLOBIN (HGB A1C): Hemoglobin A1C: 6.1 % — AB (ref 4.0–5.6)

## 2022-12-16 NOTE — Addendum Note (Signed)
Addended by: Alvina Chou on: 12/16/2022 09:38 AM   Modules accepted: Orders

## 2022-12-16 NOTE — Patient Instructions (Addendum)
Return the urine when able.   Please schedule a physical to meet with me in 6 months.   It was a pleasure to see you today!

## 2022-12-16 NOTE — Addendum Note (Signed)
Addended by: Lonia Blood on: 12/16/2022 09:08 AM   Modules accepted: Orders

## 2022-12-16 NOTE — Assessment & Plan Note (Signed)
Improved and controlled with A1C of 6.1 today.  Continue off medication. Foot exam today. Urine microalbumin due, she cannot urinate today. Updated Pneumonia vaccine today.  Follow up in 6 months.

## 2022-12-16 NOTE — Progress Notes (Signed)
Subjective:    Patient ID: Catherine Tate, female    DOB: May 24, 1940, 82 y.o.   MRN: [id number]  HPI  Catherine Tate is a very pleasant 81 y.o. female with a history of type 2 diabetes, breast cancer, hypertension, CKD, recurrent UTI, hyperlipidemia who presents today for follow up of diabetes.  Current medications include: None  She is checking her blood glucose 0 times daily.  Last A1C: 6.6 in February 2024, 6.1 today  Last Eye Exam: UTD Last Foot Exam: Due Pneumonia Vaccination: 2018 Urine Microalbumin: Due Statin: atorvastatin   Dietary changes since last visit: Eating a healthy diet for the most part.    Exercise: Walking and active at the house.    BP Readings from Last 3 Encounters:  12/16/22 136/82  12/05/22 (!) 153/81  12/02/22 (!) 148/86       Review of Systems  Eyes:  Negative for visual disturbance.  Respiratory:  Negative for shortness of breath.   Cardiovascular:  Negative for chest pain.  Neurological:  Negative for numbness.         Past Medical History:  Diagnosis Date   Arthritis    Cancer (HCC) 03/13/2016   INVASIVE MAMMARY CARCINOMA   Cataract 02/03/2018   Cellulitis of left lower extremity 09/23/2021   Chest wall abscess 05/15/2017   Chickenpox    Chronic kidney disease, stage 3 (HCC)    Complication of anesthesia    Frequent UTI    GERD (gastroesophageal reflux disease)    RARE   Hyperlipidemia    Hypertension    Osteoporosis    Personal history of chemotherapy 2017/2018   F/U left breast cancer   Personal history of radiation therapy 2018   F/U left breast cancer   PONV (postoperative nausea and vomiting)    RSD (reflex sympathetic dystrophy)    Seasonal allergies    Skin cancer    Type 2 diabetes mellitus with hyperglycemia (HCC)    Urine incontinence     Social History   Socioeconomic History   Marital status: Single    Spouse name: Not on file   Number of children: 2   Years of education: Not on file   Highest  education level: Not on file  Occupational History   Occupation: retired  Tobacco Use   Smoking status: Former    Current packs/day: 0.00    Average packs/day: 1 pack/day for 20.0 years (20.0 ttl pk-yrs)    Types: Cigarettes    Start date: 04/14/1974    Quit date: 04/14/1994    Years since quitting: 28.6   Smokeless tobacco: Never  Vaping Use   Vaping status: Never Used  Substance and Sexual Activity   Alcohol use: No    Alcohol/week: 0.0 standard drinks of alcohol   Drug use: No   Sexual activity: Not Currently  Other Topics Concern   Not on file  Social History Narrative   Single.   2 children, 1 grandchild.   Retired. Once worked in a Statistician.   Enjoys making jewelry, puzzle books, sewing, traveling.   Son lives with her   Social Determinants of Health   Financial Resource Strain: Low Risk  (08/18/2022)   Overall Financial Resource Strain (CARDIA)    Difficulty of Paying Living Expenses: Not hard at all  Food Insecurity: No Food Insecurity (08/18/2022)   Hunger Vital Sign    Worried About Running Out of Food in the Last Year: Never true    Ran Out of  Food in the Last Year: Never true  Transportation Needs: No Transportation Needs (08/18/2022)   PRAPARE - Administrator, Civil Service (Medical): No    Lack of Transportation (Non-Medical): No  Physical Activity: Inactive (08/18/2022)   Exercise Vital Sign    Days of Exercise per Week: 0 days    Minutes of Exercise per Session: 0 min  Stress: No Stress Concern Present (08/18/2022)   [employer] of Occupational Health - Occupational Stress Questionnaire    Feeling of Stress : Not at all  Social Connections: Moderately Isolated (08/18/2022)   Social Connection and Isolation Panel [NHANES]    Frequency of Communication with Friends and Family: More than three times a week    Frequency of Social Gatherings with Friends and Family: More than three times a week    Attends Religious Services: More than 4 times  per year    Active Member of [employer] or Organizations: No    Attends Banker Meetings: Never    Marital Status: Never married  Intimate Partner Violence: Not At Risk (08/18/2022)   Humiliation, Afraid, Rape, and Kick questionnaire    Fear of Current or Ex-Partner: No    Emotionally Abused: No    Physically Abused: No    Sexually Abused: No    Past Surgical History:  Procedure Laterality Date   ABDOMINAL HYSTERECTOMY  2005   total/ Dr Barnabas Lister   BLADDER TUMOR EXCISION  2006   BREAST BIOPSY Left 03/13/2016   INVASIVE MAMMARY CARCINOMA   BREAST LUMPECTOMY Left 2018   BREAST LUMPECTOMY WITH SENTINEL LYMPH NODE BIOPSY Left 07/14/2016   Procedure: BREAST LUMPECTOMY WITH SENTINEL LYMPH NODE BX;  Surgeon: Earline Mayotte, MD;  Location: [facility];  Service: General;  Laterality: Left;   COLONOSCOPY  2023   COLONOSCOPY WITH PROPOFOL N/A 01/02/2021   Procedure: COLONOSCOPY WITH PROPOFOL;  Surgeon: Earline Mayotte, MD;  Location: [facility];  Service: Endoscopy;  Laterality: N/A;   CYSTOSCOPY WITH URETHRAL DILATATION N/A 10/28/2022   Procedure: CYSTOSCOPY WITH URETHRAL DILATATION;  Surgeon: Riki Altes, MD;  Location: [facility];  Service: Urology;  Laterality: N/A;   denture surgery     ESOPHAGOGASTRODUODENOSCOPY (EGD) WITH PROPOFOL N/A 07/19/2022   Procedure: ESOPHAGOGASTRODUODENOSCOPY (EGD) WITH PROPOFOL;  Surgeon: Toledo, Boykin Nearing, MD;  Location: [facility];  Service: Gastroenterology;  Laterality: N/A;   PORT-A-CATH REMOVAL     PORTACATH PLACEMENT N/A 04/01/2016   Procedure: INSERTION PORT-A-CATH;  Surgeon: Earline Mayotte, MD;  Location: [facility];  Service: General;  Laterality: N/A;   SKIN CANCER EXCISION Right 2015   foot/ Dr Margo Aye   TUBAL LIGATION     TUMOR EXCISION Right    FOOT    Family History  Problem Relation Age of Onset   Heart disease Mother    Transient ischemic attack Mother    Emphysema Father    Breast cancer Sister    Breast cancer  Cousin        paternal   Kidney cancer Neg Hx    Bladder Cancer Neg Hx     Allergies  Allergen Reactions   Bee Pollen Hives   Mosquito (Diagnostic) Hives   Other Other (See Comments)    Uncoded Allergy. Allergen: VICRYL  (90% glycolide and 10% L-lactide [polyglactin-- synthetic absorbable sterile surgical suture]), Other Reaction: Infection   Gabapentin Swelling   Mosquito (Culex Pipiens) Allergy Skin Test Hives   Norvasc [Amlodipine Besylate] Swelling    TO FEET  Lisinopril Cough    Current Outpatient Medications on File Prior to Visit  Medication Sig Dispense Refill   acetaminophen (TYLENOL) 500 MG tablet Take 1,000 mg by mouth every 6 (six) hours as needed for moderate pain.      alendronate (FOSAMAX) 70 MG tablet TAKE ONE TABLET (70 MG TOTAL) BY MOUTH ONCE A WEEK. TAKE WITH A FULL GLASS OF WATER ON AN EMPTY STOMACH (Patient taking differently: Take 70 mg by mouth once a week. Sunday) 12 tablet 3   Ascorbic Acid (VITAMIN C) 1000 MG tablet Take 1,000 mg by mouth daily.     atorvastatin (LIPITOR) 10 MG tablet TAKE ONE TABLET BY MOUTH ONCE A DAY FOR CHOLSTEROL. (Patient taking differently: Take 10 mg by mouth daily.) 90 tablet 2   Calcium Carb-Cholecalciferol (CALCIUM/VITAMIN D PO) Take 1,200 mg by mouth.     cetirizine (ZYRTEC) 10 MG tablet Take 1 tablet (10 mg total) by mouth daily. (Patient taking differently: Take 10 mg by mouth daily as needed.) 30 tablet 11   CRANBERRY PO Take 1 capsule by mouth 2 (two) times daily.     losartan (COZAAR) 100 MG tablet TAKE ONE TABLET (100 MG TOTAL) BY MOUTH DAILY. FOR BLOOD PRESSURE. 90 tablet 1   Probiotic Product (PROBIOTIC PO) Take 1 tablet by mouth daily.     trimethoprim (TRIMPEX) 100 MG tablet Take 1 tablet (100 mg total) by mouth daily. (Patient not taking: Reported on 12/16/2022) 30 tablet 0   No current facility-administered medications on file prior to visit.    BP 136/82   Pulse 100   Temp 98.1 F (36.7 C) (Temporal)   Ht 5\' 6"   (1.676 m)   Wt 157 lb (71.2 kg)   SpO2 98%   BMI 25.34 kg/m  Objective:   Physical Exam Cardiovascular:     Rate and Rhythm: Normal rate and regular rhythm.  Pulmonary:     Effort: Pulmonary effort is normal.     Breath sounds: Normal breath sounds.  Musculoskeletal:     Cervical back: Neck supple.  Skin:    General: Skin is warm and dry.  Neurological:     Mental Status: She is alert.  Psychiatric:        Mood and Affect: Mood normal.           Assessment & Plan:  Type 2 diabetes mellitus with hyperglycemia, without long-term current use of insulin (HCC) Assessment & Plan: Improved and controlled with A1C of 6.1 today.  Continue off medication. Foot exam today. Urine microalbumin due, she cannot urinate today. Updated Pneumonia vaccine today.  Follow up in 6 months.   Orders: -     POCT glycosylated hemoglobin (Hb A1C)  Encounter for immunization -     Flu Vaccine Trivalent High Dose (Fluad)        Doreene Nest, NP

## 2022-12-24 ENCOUNTER — Encounter: Payer: Self-pay | Admitting: Physician Assistant

## 2022-12-24 ENCOUNTER — Ambulatory Visit: Payer: PPO | Admitting: Physician Assistant

## 2022-12-24 VITALS — BP 151/82 | HR 128 | Wt 157.0 lb

## 2022-12-24 DIAGNOSIS — R8281 Pyuria: Secondary | ICD-10-CM | POA: Diagnosis not present

## 2022-12-24 DIAGNOSIS — N3592 Unspecified urethral stricture, female: Secondary | ICD-10-CM

## 2022-12-24 DIAGNOSIS — N390 Urinary tract infection, site not specified: Secondary | ICD-10-CM | POA: Diagnosis not present

## 2022-12-24 DIAGNOSIS — N3946 Mixed incontinence: Secondary | ICD-10-CM | POA: Diagnosis not present

## 2022-12-24 LAB — URINALYSIS, COMPLETE
Bilirubin, UA: NEGATIVE
Glucose, UA: NEGATIVE
Ketones, UA: NEGATIVE
Nitrite, UA: NEGATIVE
Protein,UA: NEGATIVE
Specific Gravity, UA: 1.01 (ref 1.005–1.030)
Urobilinogen, Ur: 0.2 mg/dL (ref 0.2–1.0)
pH, UA: 7 (ref 5.0–7.5)

## 2022-12-24 LAB — MICROSCOPIC EXAMINATION: WBC, UA: >30 /HPF — AB (ref 0–5)

## 2022-12-24 MED ORDER — GEMTESA 75 MG PO TABS: 75.0000 mg | ORAL_TABLET | Freq: Every day | ORAL | Status: DC

## 2022-12-24 MED ORDER — SULFAMETHOXAZOLE-TRIMETHOPRIM 800-160 MG PO TABS
1.0000 | ORAL_TABLET | Freq: Two times a day (BID) | ORAL | 0 refills | Status: AC
Start: 2022-12-24 — End: 2022-12-29

## 2022-12-24 MED ORDER — ESTRADIOL 0.1 MG/GM VA CREA
TOPICAL_CREAM | VAGINAL | 12 refills | Status: AC
Start: 2022-12-24 — End: ?

## 2022-12-24 NOTE — Patient Instructions (Signed)
Start Gemtesa samples. Take these once daily, whatever time of day you prefer. It may take a couple of weeks before you notice a change in your urinary urgency and leakage on this medication. Start topical vaginal estrogen cream. Apply a pea-sized amount of cream to the tip of your finger and apply it around the opening of your urethra every day for 2 weeks, then every Monday, Wednesday, and Friday forever. You do not need to use the plastic tube applicator that comes with the cream. Start Bactrim twice daily for 5 days to treat your current infection. I will call you when I get your urine culture results back to start you on a daily suppressive antibiotic for UTI prevention.

## 2022-12-24 NOTE — Progress Notes (Signed)
12/24/2022 11:16 AM   Catherine Tate [birth year] [id number]  CC: Chief Complaint  Patient presents with   CIC teaching   HPI: Catherine Tate is a 82 y.o. 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 Lonna Cobb on 10/28/2022 who presents today for CIC teaching.   Today she reports she is certain she will be unable to perform self-catheterization and does not want to try.  She wonders why we have not put her on a suppressive antibiotic and also wonders about the utility of self catheterizing, when she is having significant urinary incontinence.  I previously spoke with Dr. Orlie Dakin, who felt that topical vaginal estrogen cream would be appropriate for her recurrent UTIs.  Patient describes both urge and stress incontinence, sometimes worse overnight.  She is not on pharmacotherapy for this.  She has been having occasional mild dysuria and brought a urine sample in a sterile cup from home this morning.   UA today positive for trace intact blood and 2+ leukocytes; urine microscopy with >30 WBCs/HPF and many bacteria.   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   Cataract 02/03/2018   Cellulitis of left lower extremity 09/23/2021   Chest wall abscess 05/15/2017   Chickenpox    Chronic kidney disease, stage 3 (HCC)    Complication of anesthesia    Frequent UTI    GERD (gastroesophageal reflux disease)    RARE   Hyperlipidemia    Hypertension    Osteoporosis    Personal history of chemotherapy 2017/2018   F/U left breast cancer   Personal history of radiation therapy 2018   F/U left breast cancer   PONV (postoperative nausea and vomiting)    RSD (reflex sympathetic dystrophy)    Seasonal allergies    Skin cancer    Type 2 diabetes mellitus with hyperglycemia (HCC)    Urine  incontinence     Surgical History: Past Surgical History:  Procedure Laterality Date   ABDOMINAL HYSTERECTOMY  2005   total/ Dr Barnabas Lister   BLADDER TUMOR EXCISION  2006   BREAST BIOPSY Left 03/13/2016   INVASIVE MAMMARY CARCINOMA   BREAST LUMPECTOMY Left 2018   BREAST LUMPECTOMY WITH SENTINEL LYMPH NODE BIOPSY Left 07/14/2016   Procedure: BREAST LUMPECTOMY WITH SENTINEL LYMPH NODE BX;  Surgeon: Earline Mayotte, MD;  Location: [facility];  Service: General;  Laterality: Left;   COLONOSCOPY  2023   COLONOSCOPY WITH PROPOFOL N/A 01/02/2021   Procedure: COLONOSCOPY WITH PROPOFOL;  Surgeon: Earline Mayotte, MD;  Location: [facility];  Service: Endoscopy;  Laterality: N/A;   CYSTOSCOPY WITH URETHRAL DILATATION N/A 10/28/2022   Procedure: CYSTOSCOPY WITH URETHRAL DILATATION;  Surgeon: Riki Altes, MD;  Location: [facility];  Service: Urology;  Laterality: N/A;   denture surgery     ESOPHAGOGASTRODUODENOSCOPY (EGD) WITH PROPOFOL N/A 07/19/2022   Procedure: ESOPHAGOGASTRODUODENOSCOPY (EGD) WITH PROPOFOL;  Surgeon: Toledo, Boykin Nearing, MD;  Location: [facility];  Service: Gastroenterology;  Laterality: N/A;   PORT-A-CATH REMOVAL     PORTACATH PLACEMENT N/A 04/01/2016   Procedure: INSERTION PORT-A-CATH;  Surgeon: Earline Mayotte, MD;  Location: [facility];  Service: General;  Laterality: N/A;   SKIN CANCER EXCISION Right 2015   foot/ Dr Margo Aye   TUBAL LIGATION     TUMOR EXCISION Right    FOOT  Home Medications:  Allergies as of 12/24/2022       Reactions   Bee Pollen Hives   Mosquito (diagnostic) Hives   Other Other (See Comments)   Uncoded Allergy. Allergen: VICRYL  (90% glycolide and 10% L-lactide [polyglactin-- synthetic absorbable sterile surgical suture]), Other Reaction: Infection   Gabapentin Swelling   Mosquito (culex Pipiens) Allergy Skin Test Hives   Norvasc [amlodipine Besylate] Swelling   TO FEET   Lisinopril Cough        Medication List         Accurate as of December 24, 2022 11:16 AM. If you have any questions, ask your nurse or doctor.          STOP taking these medications    trimethoprim 100 MG tablet Commonly known as: TRIMPEX Stopped by: Carman Ching       TAKE these medications    acetaminophen 500 MG tablet Commonly known as: TYLENOL Take 1,000 mg by mouth every 6 (six) hours as needed for moderate pain.   alendronate 70 MG tablet Commonly known as: FOSAMAX TAKE ONE TABLET (70 MG TOTAL) BY MOUTH ONCE A WEEK. TAKE WITH A FULL GLASS OF WATER ON AN EMPTY STOMACH What changed: See the new instructions.   atorvastatin 10 MG tablet Commonly known as: LIPITOR TAKE ONE TABLET BY MOUTH ONCE A DAY FOR CHOLSTEROL. What changed: See the new instructions.   CALCIUM/VITAMIN D PO Take 1,200 mg by mouth.   cetirizine 10 MG tablet Commonly known as: ZYRTEC Take 1 tablet (10 mg total) by mouth daily. What changed:  when to take this reasons to take this   CRANBERRY PO Take 1 capsule by mouth 2 (two) times daily.   estradiol 0.1 MG/GM vaginal cream Commonly known as: ESTRACE Apply one pea-sized amount around the opening of the urethra daily for 2 weeks, then 3 times weekly moving forward. Started by: Cindra Eves 75 MG Tabs Generic drug: Vibegron Take 1 tablet (75 mg total) by mouth daily. Started by: Carman Ching   losartan 100 MG tablet Commonly known as: COZAAR TAKE ONE TABLET (100 MG TOTAL) BY MOUTH DAILY. FOR BLOOD PRESSURE.   PROBIOTIC PO Take 1 tablet by mouth daily.   sulfamethoxazole-trimethoprim 800-160 MG tablet Commonly known as: BACTRIM DS Take 1 tablet by mouth 2 (two) times daily for 5 days. Started by: Carman Ching   vitamin C 1000 MG tablet Take 1,000 mg by mouth daily.        Allergies:  Allergies  Allergen Reactions   Bee Pollen Hives   Mosquito (Diagnostic) Hives   Other Other (See Comments)    Uncoded Allergy. Allergen:  VICRYL  (90% glycolide and 10% L-lactide [polyglactin-- synthetic absorbable sterile surgical suture]), Other Reaction: Infection   Gabapentin Swelling   Mosquito (Culex Pipiens) Allergy Skin Test Hives   Norvasc [Amlodipine Besylate] Swelling    TO FEET   Lisinopril Cough    Family History: Family History  Problem Relation Age of Onset   Heart disease Mother    Transient ischemic attack Mother    Emphysema Father    Breast cancer Sister    Breast cancer Cousin        paternal   Kidney cancer Neg Hx    Bladder Cancer Neg Hx     Social History:   reports that she quit smoking about 28 years ago. Her smoking use included cigarettes. She started smoking about 48 years ago. She has a 20 pack-year smoking history.  She has never used smokeless tobacco. She reports that she does not drink alcohol and does not use drugs.  Physical Exam: BP (!) 151/82   Pulse (!) 128   Wt 157 lb (71.2 kg)   BMI 25.34 kg/m   Constitutional:  Alert and oriented, no acute distress, nontoxic appearing HEENT: [place], AT Cardiovascular: No clubbing, cyanosis, or edema Respiratory: Normal respiratory effort, no increased work of breathing Skin: No rashes, bruises or suspicious lesions Neurologic: Grossly intact, no focal deficits, moving all 4 extremities Psychiatric: Normal mood and affect  Laboratory Data: Results for orders placed or performed in visit on 12/24/22  Microscopic Examination   Urine  Result Value Ref Range   WBC, UA >30 (A) 0 - 5 /hpf   RBC, Urine 0-2 0 - 2 /hpf   Epithelial Cells (non renal) 0-10 0 - 10 /hpf   Bacteria, UA Many (A) None seen/Few  Urinalysis, Complete  Result Value Ref Range   Specific Gravity, UA 1.010 1.005 - 1.030   pH, UA 7.0 5.0 - 7.5   Color, UA Yellow Yellow   Appearance Ur Hazy (A) Clear   Leukocytes,UA 2+ (A) Negative   Protein,UA Negative Negative/Trace   Glucose, UA Negative Negative   Ketones, UA Negative Negative   RBC, UA Trace (A) Negative    Bilirubin, UA Negative Negative   Urobilinogen, Ur 0.2 0.2 - 1.0 mg/dL   Nitrite, UA Negative Negative   Microscopic Examination See below:    Assessment & Plan:   1. Stricture of female urethra, unspecified stricture type Patient declined CIC teaching today.  I showed her the video, and she still declined.  We discussed that strictures have high recurrence rate and the risk of not performing daily CIC is stricture recurrence.  We also discussed that her urethral stricture is likely playing a role in her urge incontinence and recurrent UTI as below.  She expressed understanding.  2. Recurrent UTI UA today is notable for pyuria and bacteriuria consistent with cystitis, though she is not terribly symptomatic.  Will start empiric Bactrim and send for culture for further evaluation.  Will start daily suppressive antibiotics based on culture results.  We also discussed starting topical vaginal estrogen cream for UTI prevention.  We discussed this could also play a role in #3 below.  Unclear role in keeping her urethra patent given known stricture as above. - Urinalysis, Complete - CULTURE, URINE COMPREHENSIVE - sulfamethoxazole-trimethoprim (BACTRIM DS) 800-160 MG tablet; Take 1 tablet by mouth 2 (two) times daily for 5 days.  Dispense: 10 tablet; Refill: 0 - estradiol (ESTRACE) 0.1 MG/GM vaginal cream; Apply one pea-sized amount around the opening of the urethra daily for 2 weeks, then 3 times weekly moving forward.  Dispense: 42.5 g; Refill: 12  3. Urinary incontinence, mixed Her bladder scans have been normal.  I offered her 6 weeks of Gemtesa samples and she accepted. - Vibegron (GEMTESA) 75 MG TABS; Take 1 tablet (75 mg total) by mouth daily.  Return in about 6 weeks (around 02/04/2023) for Symptom recheck with PVR, Will call to start suppressive abx per cx results.  Carman Ching, PA-C  [facility] [place] [address] [place], [place] [address] [phone]

## 2023-01-01 ENCOUNTER — Other Ambulatory Visit: Payer: Self-pay

## 2023-01-01 DIAGNOSIS — N390 Urinary tract infection, site not specified: Secondary | ICD-10-CM

## 2023-01-01 LAB — CULTURE, URINE COMPREHENSIVE

## 2023-01-01 MED ORDER — CEPHALEXIN 250 MG PO CAPS: 250.0000 mg | ORAL_CAPSULE | Freq: Every day | ORAL | 6 refills | Status: DC

## 2023-01-01 MED ORDER — CEFUROXIME AXETIL 250 MG PO TABS
250.0000 mg | ORAL_TABLET | Freq: Two times a day (BID) | ORAL | 0 refills | Status: AC
Start: 2023-01-01 — End: 2023-01-06

## 2023-01-14 ENCOUNTER — Other Ambulatory Visit: Payer: Self-pay | Admitting: Primary Care

## 2023-01-14 ENCOUNTER — Other Ambulatory Visit: Payer: Self-pay | Admitting: Oncology

## 2023-01-14 DIAGNOSIS — E785 Hyperlipidemia, unspecified: Secondary | ICD-10-CM

## 2023-01-22 ENCOUNTER — Telehealth: Payer: Self-pay

## 2023-01-22 NOTE — Telephone Encounter (Signed)
Pt Catherine Tate on triage line stating that she "still has a uti" and wants to know if her antibiotic needs to be changed.   Called patient back her only complaint is intermittent dysuria. She denies flank pain, fever, and chills. She states she is taking daily Keflex as prescribed. Please advise.

## 2023-01-23 NOTE — Telephone Encounter (Signed)
I would recommend that she come back in for an office visit with cath UA and measured residual for evaluation of her persistent symptoms (okay to move up scheduled visit to sooner).  Intermittent dysuria could represent recurrence of her urethral stricture and not infection.

## 2023-01-26 ENCOUNTER — Encounter: Payer: Self-pay | Admitting: Family Medicine

## 2023-01-26 ENCOUNTER — Ambulatory Visit: Payer: PPO | Admitting: Family Medicine

## 2023-01-26 VITALS — BP 144/80 | HR 109 | Temp 97.9°F | Ht 66.0 in | Wt 159.4 lb

## 2023-01-26 DIAGNOSIS — J301 Allergic rhinitis due to pollen: Secondary | ICD-10-CM

## 2023-01-26 DIAGNOSIS — R059 Cough, unspecified: Secondary | ICD-10-CM | POA: Diagnosis not present

## 2023-01-26 MED ORDER — AMOXICILLIN-POT CLAVULANATE 875-125 MG PO TABS: 1.0000 | ORAL_TABLET | Freq: Two times a day (BID) | ORAL | 0 refills | Status: DC

## 2023-01-26 MED ORDER — ALBUTEROL SULFATE HFA 108 (90 BASE) MCG/ACT IN AERS: 1.0000 | INHALATION_SPRAY | Freq: Four times a day (QID) | RESPIRATORY_TRACT | 2 refills | Status: DC | PRN

## 2023-01-26 MED ORDER — CETIRIZINE HCL 10 MG PO TABS
10.0000 mg | ORAL_TABLET | Freq: Every day | ORAL | Status: AC | PRN
Start: 2023-01-26 — End: ?

## 2023-01-26 NOTE — Telephone Encounter (Signed)
Pt states her PCP put her on amoxicillin for bronchitis for 10 days. Pt states she will keep appointment on 02/05/2023

## 2023-01-26 NOTE — Patient Instructions (Signed)
Use the inhaler if needed.   Drink plenty of fluid and get some rest.  Start augmentin in the meantime but hold keflex while on augmentin.  Update [place] as needed.  Take care.  Glad to see you.

## 2023-01-26 NOTE — Progress Notes (Unsigned)
Sx started about 1 week ago.  Started with cough.  Inc cough and wheeze at night.  No wheeze in the day.  Sputum is yellowish.  Rhinorrhea is yellowish.  No fevers.  No vomiting, no diarrhea.  No throat pain now but ST last week.  No rash.  Son was sick recently with similar.     Meds, vitals, and allergies reviewed.   ROS: Per HPI unless specifically indicated in ROS section   Nad Ncat MMM L SOM noted.  No TM erythema Purulent rhinorrhea.  Neck supple, no LA No focal dec in BS but diffuse rhonchi.   Mildly tachy but regular.  Abd soft, not ttp No BLE edema.

## 2023-01-28 DIAGNOSIS — R059 Cough, unspecified: Secondary | ICD-10-CM | POA: Insufficient documentation

## 2023-01-28 NOTE — Assessment & Plan Note (Signed)
Presumed bronchitis.   Use albuterol if needed.   Rest and fluids.  Start augmentin in the meantime but hold keflex while on augmentin.  Update [place] as needed.  Okay for outpatient follow-up.  She agrees to plan.

## 2023-02-05 ENCOUNTER — Ambulatory Visit: Payer: PPO | Admitting: Physician Assistant

## 2023-02-05 VITALS — BP 158/91 | HR 118 | Ht 66.0 in | Wt 159.0 lb

## 2023-02-05 DIAGNOSIS — R300 Dysuria: Secondary | ICD-10-CM

## 2023-02-05 DIAGNOSIS — N3946 Mixed incontinence: Secondary | ICD-10-CM | POA: Diagnosis not present

## 2023-02-05 DIAGNOSIS — N390 Urinary tract infection, site not specified: Secondary | ICD-10-CM | POA: Diagnosis not present

## 2023-02-05 LAB — URINALYSIS, COMPLETE
Bilirubin, UA: NEGATIVE
Glucose, UA: NEGATIVE
Ketones, UA: NEGATIVE
Nitrite, UA: NEGATIVE
Protein,UA: NEGATIVE
Specific Gravity, UA: <1.005 — ABNORMAL LOW (ref 1.005–1.030)
Urobilinogen, Ur: 0.2 mg/dL (ref 0.2–1.0)
pH, UA: 6 (ref 5.0–7.5)

## 2023-02-05 LAB — MICROSCOPIC EXAMINATION: Epithelial Cells (non renal): >10 /[HPF] — AB (ref 0–10)

## 2023-02-05 LAB — BLADDER SCAN AMB NON-IMAGING: PVR: 32 WU

## 2023-02-05 NOTE — Progress Notes (Signed)
02/05/2023 9:50 AM   Catherine Tate 30-May-1940 [id number]  CC: Chief Complaint  Patient presents with   Follow-up   HPI: Catherine Tate is a 82 y.o. 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 Lonna Cobb in July who presents today for evaluation of recurrent versus persistent UTI.   I saw her in clinic most recently on 12/24/2022 for postoperative CIC teaching, which she declined.  I started her on estrogen cream and Gemtesa at that time.  She subsequently called clinic to report persistent dysuria.  I asked her to return to clinic for a cath UA to assess her for stricture recurrence.  She brings a urine specimen from home with her to clinic today.  She is on Augmentin per her PCP for bronchitis and states her dysuria has significantly improved.  She plans to resume suppressive Keflex tomorrow after she completes Augmentin.  She is still using estrogen cream 3 times weekly.  She reports increasing dysuria when she drinks more than 1 glass of ginger ale or cranberry juice in a day.  Her urgency, frequency, and nocturia are better so she wonders if she should stop Gemtesa.  In-office UA today positive for intact blood and trace leukocytes; urine microscopy with 6-10 WBCs/HPF and >10 epithelial cells/hpf.  PVR 32mL.  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   Cataract 02/03/2018   Cellulitis of left lower extremity 09/23/2021   Chest wall abscess 05/15/2017   Chickenpox    Chronic kidney disease, stage 3 (HCC)    Complication of anesthesia    Frequent UTI    GERD (gastroesophageal reflux disease)    RARE   Hyperlipidemia    Hypertension    Osteoporosis    Personal history of chemotherapy 2017/2018   F/U left breast cancer   Personal  history of radiation therapy 2018   F/U left breast cancer   PONV (postoperative nausea and vomiting)    RSD (reflex sympathetic dystrophy)    Seasonal allergies    Skin cancer    Type 2 diabetes mellitus with hyperglycemia (HCC)    Urine incontinence     Surgical History: Past Surgical History:  Procedure Laterality Date   ABDOMINAL HYSTERECTOMY  2005   total/ Dr Barnabas Lister   BLADDER TUMOR EXCISION  2006   BREAST BIOPSY Left 03/13/2016   INVASIVE MAMMARY CARCINOMA   BREAST LUMPECTOMY Left 2018   BREAST LUMPECTOMY WITH SENTINEL LYMPH NODE BIOPSY Left 07/14/2016   Procedure: BREAST LUMPECTOMY WITH SENTINEL LYMPH NODE BX;  Surgeon: Earline Mayotte, MD;  Location: [facility];  Service: General;  Laterality: Left;   COLONOSCOPY  2023   COLONOSCOPY WITH PROPOFOL N/A 01/02/2021   Procedure: COLONOSCOPY WITH PROPOFOL;  Surgeon: Earline Mayotte, MD;  Location: [facility];  Service: Endoscopy;  Laterality: N/A;   CYSTOSCOPY WITH URETHRAL DILATATION N/A 10/28/2022   Procedure: CYSTOSCOPY WITH URETHRAL DILATATION;  Surgeon: Riki Altes, MD;  Location: [facility];  Service: Urology;  Laterality: N/A;   denture surgery     ESOPHAGOGASTRODUODENOSCOPY (EGD) WITH PROPOFOL N/A 07/19/2022   Procedure: ESOPHAGOGASTRODUODENOSCOPY (EGD) WITH PROPOFOL;  Surgeon: Toledo, Boykin Nearing, MD;  Location: [facility];  Service: Gastroenterology;  Laterality: N/A;   PORT-A-CATH REMOVAL     PORTACATH PLACEMENT N/A 04/01/2016   Procedure:  INSERTION PORT-A-CATH;  Surgeon: Earline Mayotte, MD;  Location: [facility];  Service: General;  Laterality: N/A;   SKIN CANCER EXCISION Right 2015   foot/ Dr Margo Aye   TUBAL LIGATION     TUMOR EXCISION Right    FOOT    Home Medications:  Allergies as of 02/05/2023       Reactions   Bee Pollen Hives   Mosquito (diagnostic) Hives   Other Other (See Comments)   Uncoded Allergy. Allergen: VICRYL  (90% glycolide and 10% L-lactide [polyglactin-- synthetic absorbable  sterile surgical suture]), Other Reaction: Infection   Gabapentin Swelling   Mosquito (culex Pipiens) Allergy Skin Test Hives   Norvasc [amlodipine Besylate] Swelling   TO FEET   Lisinopril Cough        Medication List        Accurate as of February 05, 2023  9:50 AM. If you have any questions, ask your nurse or doctor.          acetaminophen 500 MG tablet Commonly known as: TYLENOL Take 1,000 mg by mouth every 6 (six) hours as needed for moderate pain.   albuterol 108 (90 Base) MCG/ACT inhaler Commonly known as: VENTOLIN HFA Inhale 1-2 puffs into the lungs every 6 (six) hours as needed for wheezing (or cough).   alendronate 70 MG tablet Commonly known as: FOSAMAX TAKE ONE TABLET (70 MG TOTAL) BY MOUTH ONCE A WEEK. TAKE WITH A FULL GLASS OF WATER ON AN EMPTY STOMACH   amoxicillin-clavulanate 875-125 MG tablet Commonly known as: AUGMENTIN Take 1 tablet by mouth 2 (two) times daily.   atorvastatin 10 MG tablet Commonly known as: LIPITOR TAKE ONE TABLET BY MOUTH ONCE A DAY FOR CHOLSTEROL.   CALCIUM/VITAMIN D PO Take 1,200 mg by mouth.   cephALEXin 250 MG capsule Commonly known as: Keflex Take 1 capsule (250 mg total) by mouth daily.   cetirizine 10 MG tablet Commonly known as: ZYRTEC Take 1 tablet (10 mg total) by mouth daily as needed.   CRANBERRY PO Take 1 capsule by mouth 2 (two) times daily.   estradiol 0.1 MG/GM vaginal cream Commonly known as: ESTRACE Apply one pea-sized amount around the opening of the urethra daily for 2 weeks, then 3 times weekly moving forward.   Gemtesa 75 MG Tabs Generic drug: Vibegron Take 1 tablet (75 mg total) by mouth daily.   losartan 100 MG tablet Commonly known as: COZAAR TAKE ONE TABLET (100 MG TOTAL) BY MOUTH DAILY. FOR BLOOD PRESSURE.   PROBIOTIC PO Take 1 tablet by mouth daily.   vitamin C 1000 MG tablet Take 1,000 mg by mouth daily.        Allergies:  Allergies  Allergen Reactions   Bee Pollen Hives    Mosquito (Diagnostic) Hives   Other Other (See Comments)    Uncoded Allergy. Allergen: VICRYL  (90% glycolide and 10% L-lactide [polyglactin-- synthetic absorbable sterile surgical suture]), Other Reaction: Infection   Gabapentin Swelling   Mosquito (Culex Pipiens) Allergy Skin Test Hives   Norvasc [Amlodipine Besylate] Swelling    TO FEET   Lisinopril Cough    Family History: Family History  Problem Relation Age of Onset   Heart disease Mother    Transient ischemic attack Mother    Emphysema Father    Breast cancer Sister    Breast cancer Cousin        paternal   Kidney cancer Neg Hx    Bladder Cancer Neg Hx     Social History:  reports that she quit smoking about 28 years ago. Her smoking use included cigarettes. She started smoking about 48 years ago. She has a 20 pack-year smoking history. She has never used smokeless tobacco. She reports that she does not drink alcohol and does not use drugs.  Physical Exam: BP (!) 158/91   Pulse (!) 118   Ht 5\' 6"  (1.676 m)   Wt 159 lb (72.1 kg)   BMI 25.66 kg/m   Constitutional:  Alert and oriented, no acute distress, nontoxic appearing HEENT: [place], AT Cardiovascular: No clubbing, cyanosis, or edema Respiratory: Normal respiratory effort, no increased work of breathing Skin: No rashes, bruises or suspicious lesions Neurologic: Grossly intact, no focal deficits, moving all 4 extremities Psychiatric: Normal mood and affect  Laboratory Data: Results for orders placed or performed in visit on 02/05/23  Microscopic Examination   Urine  Result Value Ref Range   WBC, UA 6-10 (A) 0 - 5 /hpf   RBC, Urine 0-2 0 - 2 /hpf   Epithelial Cells (non renal) >10 (A) 0 - 10 /hpf   Bacteria, UA Few None seen/Few  Urinalysis, Complete  Result Value Ref Range   Specific Gravity, UA <1.005 (L) 1.005 - 1.030   pH, UA 6.0 5.0 - 7.5   Color, UA Yellow Yellow   Appearance Ur Clear Clear   Leukocytes,UA Trace (A) Negative   Protein,UA Negative  Negative/Trace   Glucose, UA Negative Negative   Ketones, UA Negative Negative   RBC, UA Trace (A) Negative   Bilirubin, UA Negative Negative   Urobilinogen, Ur 0.2 0.2 - 1.0 mg/dL   Nitrite, UA Negative Negative   Microscopic Examination See below:   Bladder Scan (Post Void Residual) in office  Result Value Ref Range   PVR 32.0 WU   Assessment & Plan:   1. Dysuria Today appears contaminated, low suspicion for UTI.  Agree with resuming suppressive Keflex.  We were unable to perform a cath UA today to assess her for recurrent stricture, however she is emptying appropriately and previously declined CIC, so we will continue to manage her conservatively from this standpoint.  I encouraged her to continue estrogen cream.  We also discussed that carbonated and acidic food/beverages can irritate the bladder. - Bladder Scan (Post Void Residual) in office - Urinalysis, Complete  2. Urinary incontinence, mixed Symptoms have improved and she wishes to discontinue Gemtesa, which is fine.  I asked her to call me back if her symptoms worsen and she wants to resume it.  Return if symptoms worsen or fail to improve.  Carman Ching, PA-C  [facility] [place] [address] [place], [place] [address] [phone]

## 2023-02-24 ENCOUNTER — Other Ambulatory Visit: Payer: Self-pay | Admitting: Primary Care

## 2023-02-24 ENCOUNTER — Ambulatory Visit (INDEPENDENT_AMBULATORY_CARE_PROVIDER_SITE_OTHER): Payer: PPO | Admitting: Primary Care

## 2023-02-24 ENCOUNTER — Ambulatory Visit: Payer: PPO | Admitting: Physician Assistant

## 2023-02-24 ENCOUNTER — Encounter: Payer: Self-pay | Admitting: Primary Care

## 2023-02-24 ENCOUNTER — Encounter: Payer: Self-pay | Admitting: Physician Assistant

## 2023-02-24 VITALS — BP 173/90 | HR 110

## 2023-02-24 VITALS — BP 172/88 | HR 112 | Temp 98.0°F | Ht 66.0 in | Wt 158.0 lb

## 2023-02-24 DIAGNOSIS — R000 Tachycardia, unspecified: Secondary | ICD-10-CM

## 2023-02-24 DIAGNOSIS — R300 Dysuria: Secondary | ICD-10-CM | POA: Diagnosis not present

## 2023-02-24 DIAGNOSIS — N3592 Unspecified urethral stricture, female: Secondary | ICD-10-CM | POA: Diagnosis not present

## 2023-02-24 DIAGNOSIS — N958 Other specified menopausal and perimenopausal disorders: Secondary | ICD-10-CM

## 2023-02-24 DIAGNOSIS — I10 Essential (primary) hypertension: Secondary | ICD-10-CM | POA: Diagnosis not present

## 2023-02-24 LAB — BASIC METABOLIC PANEL
BUN: 14 mg/dL (ref 6–23)
CO2: 25 meq/L (ref 19–32)
Calcium: 9.3 mg/dL (ref 8.4–10.5)
Chloride: 103 meq/L (ref 96–112)
Creatinine, Ser: 0.96 mg/dL (ref 0.40–1.20)
GFR: 55.66 mL/min — ABNORMAL LOW (ref >60.00)
Glucose, Bld: 108 mg/dL — ABNORMAL HIGH (ref 70–99)
Potassium: 4.3 meq/L (ref 3.5–5.1)
Sodium: 136 meq/L (ref 135–145)

## 2023-02-24 MED ORDER — CARVEDILOL 6.25 MG PO TABS
6.2500 mg | ORAL_TABLET | Freq: Two times a day (BID) | ORAL | 0 refills | Status: DC
Start: 2023-02-24 — End: 2023-03-20

## 2023-02-24 MED ORDER — AMLODIPINE BESYLATE 5 MG PO TABS
5.0000 mg | ORAL_TABLET | Freq: Every day | ORAL | 0 refills | Status: DC
Start: 2023-02-24 — End: 2023-02-24

## 2023-02-24 NOTE — Progress Notes (Signed)
02/24/2023 10:30 AM   [name] [birth year] [id number]  CC: Chief Complaint  Patient presents with   Follow-up   HPI: Catherine Tate is a 82 y.o. female with PMH ER/PR positive, HER2 negative breast cancer previously on letrozole, recurrent UTI on suppressive Keflex and estrogen cream, mixed incontinence on Gemtesa, and recurrent urethral stricture s/p dilation and cystoscopy with Dr. Lonna Cobb in July who declined CIC teaching who presents today for evaluation of intermittent dysuria.   Today she reports intermittent burning with urination.  She continues to void spontaneously.  Bladder scan on arrival 0 mL.  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   Cataract 02/03/2018   Cellulitis of left lower extremity 09/23/2021   Chest wall abscess 05/15/2017   Chickenpox    Chronic kidney disease, stage 3 (HCC)    Complication of anesthesia    Frequent UTI    GERD (gastroesophageal reflux disease)    RARE   Hyperlipidemia    Hypertension    Osteoporosis    Personal history of chemotherapy 2017/2018   F/U left breast cancer   Personal history of radiation therapy 2018   F/U left breast cancer   PONV (postoperative nausea and vomiting)    RSD (reflex sympathetic dystrophy)    Seasonal allergies    Skin cancer    Type 2 diabetes mellitus with hyperglycemia (HCC)    Urine incontinence     Surgical History: Past Surgical History:  Procedure Laterality Date   ABDOMINAL HYSTERECTOMY  2005   total/ Dr Barnabas Lister   BLADDER TUMOR EXCISION  2006   BREAST BIOPSY Left 03/13/2016   INVASIVE MAMMARY CARCINOMA   BREAST LUMPECTOMY Left 2018   BREAST LUMPECTOMY WITH SENTINEL LYMPH NODE BIOPSY Left 07/14/2016   Procedure: BREAST LUMPECTOMY WITH SENTINEL LYMPH NODE BX;  Surgeon: Earline Mayotte, MD;  Location: [facility];  Service: General;  Laterality: Left;   COLONOSCOPY  2023   COLONOSCOPY WITH PROPOFOL N/A  01/02/2021   Procedure: COLONOSCOPY WITH PROPOFOL;  Surgeon: Earline Mayotte, MD;  Location: [facility];  Service: Endoscopy;  Laterality: N/A;   CYSTOSCOPY WITH URETHRAL DILATATION N/A 10/28/2022   Procedure: CYSTOSCOPY WITH URETHRAL DILATATION;  Surgeon: Riki Altes, MD;  Location: [facility];  Service: Urology;  Laterality: N/A;   denture surgery     ESOPHAGOGASTRODUODENOSCOPY (EGD) WITH PROPOFOL N/A 07/19/2022   Procedure: ESOPHAGOGASTRODUODENOSCOPY (EGD) WITH PROPOFOL;  Surgeon: Toledo, Boykin Nearing, MD;  Location: [facility];  Service: Gastroenterology;  Laterality: N/A;   PORT-A-CATH REMOVAL     PORTACATH PLACEMENT N/A 04/01/2016   Procedure: INSERTION PORT-A-CATH;  Surgeon: Earline Mayotte, MD;  Location: [facility];  Service: General;  Laterality: N/A;   SKIN CANCER EXCISION Right 2015   foot/ Dr Margo Aye   TUBAL LIGATION     TUMOR EXCISION Right    FOOT    Home Medications:  Allergies as of 02/24/2023       Reactions   Bee Pollen Hives   Mosquito (diagnostic) Hives   Other Other (See Comments)   Uncoded Allergy. Allergen: VICRYL  (90% glycolide and 10% L-lactide [polyglactin-- synthetic absorbable sterile surgical suture]), Other Reaction: Infection   Gabapentin Swelling   Mosquito (culex Pipiens) Allergy Skin Test Hives   Norvasc [amlodipine Besylate] Swelling   TO FEET   Lisinopril Cough        Medication List  Accurate as of February 24, 2023 10:30 AM. If you have any questions, ask your nurse or doctor.          acetaminophen 500 MG tablet Commonly known as: TYLENOL Take 1,000 mg by mouth every 6 (six) hours as needed for moderate pain.   albuterol 108 (90 Base) MCG/ACT inhaler Commonly known as: VENTOLIN HFA Inhale 1-2 puffs into the lungs every 6 (six) hours as needed for wheezing (or cough).   alendronate 70 MG tablet Commonly known as: FOSAMAX TAKE ONE TABLET (70 MG TOTAL) BY MOUTH ONCE A WEEK. TAKE WITH A FULL GLASS OF WATER ON AN  EMPTY STOMACH   amoxicillin-clavulanate 875-125 MG tablet Commonly known as: AUGMENTIN Take 1 tablet by mouth 2 (two) times daily.   atorvastatin 10 MG tablet Commonly known as: LIPITOR TAKE ONE TABLET BY MOUTH ONCE A DAY FOR CHOLSTEROL.   CALCIUM/VITAMIN D PO Take 1,200 mg by mouth.   cephALEXin 250 MG capsule Commonly known as: Keflex Take 1 capsule (250 mg total) by mouth daily.   cetirizine 10 MG tablet Commonly known as: ZYRTEC Take 1 tablet (10 mg total) by mouth daily as needed.   CRANBERRY PO Take 1 capsule by mouth 2 (two) times daily.   estradiol 0.1 MG/GM vaginal cream Commonly known as: ESTRACE Apply one pea-sized amount around the opening of the urethra daily for 2 weeks, then 3 times weekly moving forward.   losartan 100 MG tablet Commonly known as: COZAAR TAKE ONE TABLET (100 MG TOTAL) BY MOUTH DAILY. FOR BLOOD PRESSURE.   PROBIOTIC PO Take 1 tablet by mouth daily.   vitamin C 1000 MG tablet Take 1,000 mg by mouth daily.        Allergies:  Allergies  Allergen Reactions   Bee Pollen Hives   Mosquito (Diagnostic) Hives   Other Other (See Comments)    Uncoded Allergy. Allergen: VICRYL  (90% glycolide and 10% L-lactide [polyglactin-- synthetic absorbable sterile surgical suture]), Other Reaction: Infection   Gabapentin Swelling   Mosquito (Culex Pipiens) Allergy Skin Test Hives   Norvasc [Amlodipine Besylate] Swelling    TO FEET   Lisinopril Cough    Family History: Family History  Problem Relation Age of Onset   Heart disease Mother    Transient ischemic attack Mother    Emphysema Father    Breast cancer Sister    Breast cancer Cousin        paternal   Kidney cancer Neg Hx    Bladder Cancer Neg Hx     Social History:   reports that she quit smoking about 28 years ago. Her smoking use included cigarettes. She started smoking about 48 years ago. She has a 20 pack-year smoking history. She has never used smokeless tobacco. She reports  that she does not drink alcohol and does not use drugs.  Physical Exam: BP (!) 175/93   Pulse (!) 125   Constitutional:  Alert and oriented, no acute distress, nontoxic appearing HEENT: [place], AT Cardiovascular: No clubbing, cyanosis, or edema Respiratory: Normal respiratory effort, no increased work of breathing GU: Grade 2 cystocele, labial pallor. Skin: No rashes, bruises or suspicious lesions Neurologic: Grossly intact, no focal deficits, moving all 4 extremities Psychiatric: Normal mood and affect  In and Out Catheterization  Patient is present today for a I & O catheterization due to history of stricture. Patient was cleaned and prepped in a sterile fashion with betadine . A 14FR cath was introduced into the urethral meatus but unable  to be advanced to the level of the bladder. A clean urine sample was not obtained.    Performed by: Carman Ching, PA-C and Randa Lynn, CMA  Assessment & Plan:   1. Stricture of female urethra, unspecified stricture type Her stricture has recurred on exam today, as I was unable to advance a 14 French catheter to the level of the bladder.  I offered her cystoscopy with Optilume dilation in the OR with Dr. Lonna Cobb, but she declined this and states that she does not wish to keep going back to the operating room if this is going to keep recurring.  We had a lengthy conversation about conservative management of this stricture as an alternative.  We discussed that she may continue to have intermittent burning and that we could monitor her with serial bladder scans, and as long as she is emptying appropriately we do not need to pursue additional intervention.  We discussed that if she develops urinary retention, options will include repeat dilation versus suprapubic catheter.  She is not particularly interested in either of these.  We discussed return precautions including sudden inability to void.  2. Dysuria Multifactorial in the setting  of recurrent stricture as above, GSM as below, and urinary incontinence with moisture dermatitis.  I think she is unlikely to be infected today, but was unable to obtain a urine specimen for testing.  3. Genitourinary syndrome of menopause Noted on physical exam.  Already on estrogen cream.  Return in about 3 months (around 05/27/2023) for Follow up with Dr. Lonna Cobb with UA, PVR.  Carman Ching, PA-C  [facility] [place] [address], [address] [place], [place] [address] [phone]

## 2023-02-24 NOTE — Assessment & Plan Note (Signed)
Chronic, unclear etiology. She doesn't appear acutely ill. Does have recurrent UTI history.   ECG today with sinus tachycardia with rate of 109.  Appears similar to April 2024.  Start carvedilol 6.25 mg BID. Follow up in 2 weeks.

## 2023-02-24 NOTE — Progress Notes (Signed)
Subjective:    Patient ID: Catherine Tate, female    DOB: 1940-12-17, 82 y.o.   MRN: [id number]  Hypertension Associated symptoms include headaches. Pertinent negatives include no chest pain or shortness of breath.    Catherine Tate is a very pleasant 82 y.o. female with a history of hypertension, type 2 diabetes, recurrent UTI, breast cancer, hyperlipidemia who presents today to discuss hypertension.  Currently managed on losartan 100 mg daily. Over the last 7 months she's had elevated blood pressure readings during office visits.   She has not check her BP at home recently. She has noticed dull head pressure intermittently. She denies dizziness, chest pain, palpitations.   Evaluated by Urology this morning for recurrent UTI and urethral stricture. BP was 173/90. Last BMP with with creatinine of 1.27, GFR of 39. She denies dysuria, urinary frequency, hematuria.   BP Readings from Last 3 Encounters:  02/24/23 (!) 172/88  02/24/23 (!) 173/90  02/05/23 (!) 158/91        Review of Systems  Respiratory:  Negative for shortness of breath.   Cardiovascular:  Negative for chest pain.  Genitourinary:  Negative for dysuria and frequency.  Neurological:  Positive for headaches. Negative for dizziness.         Past Medical History:  Diagnosis Date   Arthritis    Cancer (HCC) 03/13/2016   INVASIVE MAMMARY CARCINOMA   Cataract 02/03/2018   Cellulitis of left lower extremity 09/23/2021   Chest wall abscess 05/15/2017   Chickenpox    Chronic kidney disease, stage 3 (HCC)    Complication of anesthesia    Frequent UTI    GERD (gastroesophageal reflux disease)    RARE   Hyperlipidemia    Hypertension    Osteoporosis    Personal history of chemotherapy 2017/2018   F/U left breast cancer   Personal history of radiation therapy 2018   F/U left breast cancer   PONV (postoperative nausea and vomiting)    RSD (reflex sympathetic dystrophy)    Seasonal allergies    Skin cancer     Type 2 diabetes mellitus with hyperglycemia (HCC)    Urine incontinence     Social History   Socioeconomic History   Marital status: Single    Spouse name: Not on file   Number of children: 2   Years of education: Not on file   Highest education level: Not on file  Occupational History   Occupation: retired  Tobacco Use   Smoking status: Former    Current packs/day: 0.00    Average packs/day: 1 pack/day for 20.0 years (20.0 ttl pk-yrs)    Types: Cigarettes    Start date: 04/14/1974    Quit date: 04/14/1994    Years since quitting: 28.8   Smokeless tobacco: Never  Vaping Use   Vaping status: Never Used  Substance and Sexual Activity   Alcohol use: No    Alcohol/week: 0.0 standard drinks of alcohol   Drug use: No   Sexual activity: Not Currently  Other Topics Concern   Not on file  Social History Narrative   Single.   2 children, 1 grandchild.   Retired. Once worked in a Statistician.   Enjoys making jewelry, puzzle books, sewing, traveling.   Son lives with her   Social Determinants of Health   Financial Resource Strain: Low Risk  (08/18/2022)   Overall Financial Resource Strain (CARDIA)    Difficulty of Paying Living Expenses: Not hard at all  Food Insecurity:  No Food Insecurity (08/18/2022)   Hunger Vital Sign    Worried About Running Out of Food in the Last Year: Never true    Ran Out of Food in the Last Year: Never true  Transportation Needs: No Transportation Needs (08/18/2022)   PRAPARE - Administrator, Civil Service (Medical): No    Lack of Transportation (Non-Medical): No  Physical Activity: Inactive (08/18/2022)   Exercise Vital Sign    Days of Exercise per Week: 0 days    Minutes of Exercise per Session: 0 min  Stress: No Stress Concern Present (08/18/2022)   [employer] of Occupational Health - Occupational Stress Questionnaire    Feeling of Stress : Not at all  Social Connections: Moderately Isolated (08/18/2022)   Social Connection and  Isolation Panel [NHANES]    Frequency of Communication with Friends and Family: More than three times a week    Frequency of Social Gatherings with Friends and Family: More than three times a week    Attends Religious Services: More than 4 times per year    Active Member of [employer] or Organizations: No    Attends Banker Meetings: Never    Marital Status: Never married  Intimate Partner Violence: Not At Risk (08/18/2022)   Humiliation, Afraid, Rape, and Kick questionnaire    Fear of Current or Ex-Partner: No    Emotionally Abused: No    Physically Abused: No    Sexually Abused: No    Past Surgical History:  Procedure Laterality Date   ABDOMINAL HYSTERECTOMY  2005   total/ Dr Barnabas Lister   BLADDER TUMOR EXCISION  2006   BREAST BIOPSY Left 03/13/2016   INVASIVE MAMMARY CARCINOMA   BREAST LUMPECTOMY Left 2018   BREAST LUMPECTOMY WITH SENTINEL LYMPH NODE BIOPSY Left 07/14/2016   Procedure: BREAST LUMPECTOMY WITH SENTINEL LYMPH NODE BX;  Surgeon: Earline Mayotte, MD;  Location: [facility];  Service: General;  Laterality: Left;   COLONOSCOPY  2023   COLONOSCOPY WITH PROPOFOL N/A 01/02/2021   Procedure: COLONOSCOPY WITH PROPOFOL;  Surgeon: Earline Mayotte, MD;  Location: [facility];  Service: Endoscopy;  Laterality: N/A;   CYSTOSCOPY WITH URETHRAL DILATATION N/A 10/28/2022   Procedure: CYSTOSCOPY WITH URETHRAL DILATATION;  Surgeon: Riki Altes, MD;  Location: [facility];  Service: Urology;  Laterality: N/A;   denture surgery     ESOPHAGOGASTRODUODENOSCOPY (EGD) WITH PROPOFOL N/A 07/19/2022   Procedure: ESOPHAGOGASTRODUODENOSCOPY (EGD) WITH PROPOFOL;  Surgeon: Toledo, Boykin Nearing, MD;  Location: [facility];  Service: Gastroenterology;  Laterality: N/A;   PORT-A-CATH REMOVAL     PORTACATH PLACEMENT N/A 04/01/2016   Procedure: INSERTION PORT-A-CATH;  Surgeon: Earline Mayotte, MD;  Location: [facility];  Service: General;  Laterality: N/A;   SKIN CANCER EXCISION Right 2015    foot/ Dr Margo Aye   TUBAL LIGATION     TUMOR EXCISION Right    FOOT    Family History  Problem Relation Age of Onset   Heart disease Mother    Transient ischemic attack Mother    Emphysema Father    Breast cancer Sister    Breast cancer Cousin        paternal   Kidney cancer Neg Hx    Bladder Cancer Neg Hx     Allergies  Allergen Reactions   Bee Pollen Hives   Mosquito (Diagnostic) Hives   Other Other (See Comments)    Uncoded Allergy. Allergen: VICRYL  (90% glycolide and 10% L-lactide [polyglactin-- synthetic absorbable sterile surgical  suture]), Other Reaction: Infection   Gabapentin Swelling   Mosquito (Culex Pipiens) Allergy Skin Test Hives   Norvasc [Amlodipine Besylate] Swelling    TO FEET   Lisinopril Cough    Current Outpatient Medications on File Prior to Visit  Medication Sig Dispense Refill   acetaminophen (TYLENOL) 500 MG tablet Take 1,000 mg by mouth every 6 (six) hours as needed for moderate pain.      alendronate (FOSAMAX) 70 MG tablet TAKE ONE TABLET (70 MG TOTAL) BY MOUTH ONCE A WEEK. TAKE WITH A FULL GLASS OF WATER ON AN EMPTY STOMACH 12 tablet 3   Ascorbic Acid (VITAMIN C) 1000 MG tablet Take 1,000 mg by mouth daily.     atorvastatin (LIPITOR) 10 MG tablet TAKE ONE TABLET BY MOUTH ONCE A DAY FOR CHOLSTEROL. 90 tablet 2   Calcium Carb-Cholecalciferol (CALCIUM/VITAMIN D PO) Take 1,200 mg by mouth.     cephALEXin (KEFLEX) 250 MG capsule Take 1 capsule (250 mg total) by mouth daily. 30 capsule 6   cetirizine (ZYRTEC) 10 MG tablet Take 1 tablet (10 mg total) by mouth daily as needed.     CRANBERRY PO Take 1 capsule by mouth 2 (two) times daily.     estradiol (ESTRACE) 0.1 MG/GM vaginal cream Apply one pea-sized amount around the opening of the urethra daily for 2 weeks, then 3 times weekly moving forward. 42.5 g 12   losartan (COZAAR) 100 MG tablet TAKE ONE TABLET (100 MG TOTAL) BY MOUTH DAILY. FOR BLOOD PRESSURE. 90 tablet 1   Probiotic Product (PROBIOTIC PO)  Take 1 tablet by mouth daily.     albuterol (VENTOLIN HFA) 108 (90 Base) MCG/ACT inhaler Inhale 1-2 puffs into the lungs every 6 (six) hours as needed for wheezing (or cough). (Patient not taking: Reported on 02/24/2023) 8 g 2   No current facility-administered medications on file prior to visit.    BP (!) 172/88   Pulse (!) 112   Temp 98 F (36.7 C) (Temporal)   Ht 5\' 6"  (1.676 m)   Wt 158 lb (71.7 kg)   SpO2 97%   BMI 25.50 kg/m  Objective:   Physical Exam Cardiovascular:     Rate and Rhythm: Regular rhythm. Tachycardia present.  Pulmonary:     Effort: Pulmonary effort is normal.     Breath sounds: Normal breath sounds.  Musculoskeletal:     Cervical back: Neck supple.  Skin:    General: Skin is warm and dry.  Neurological:     Mental Status: She is alert and oriented to person, place, and time.  Psychiatric:        Mood and Affect: Mood normal.           Assessment & Plan:  Essential hypertension Assessment & Plan: Uncontrolled.  Also with chronic sinus tachycardia.   ECG with sinus tachycardia with rate of 109. No ST changes or PAC/PVC. Appears similar to ECG from April 2024  Start carvedilol 6.25 mg BID. Continue losartan 100 mg daily.  Avoid hydrochlorothiazide given CKD.   Repeat BMP pending.   Follow up in 2 weeks for BP check.   Orders: -     Basic metabolic panel -     EKG 12-Lead -     Carvedilol; Take 1 tablet (6.25 mg total) by mouth 2 (two) times daily with a meal. for blood pressure.  Dispense: 180 tablet; Refill: 0  Tachycardia Assessment & Plan: Chronic, unclear etiology. She doesn't appear acutely ill. Does have recurrent UTI  history.   ECG today with sinus tachycardia with rate of 109.  Appears similar to April 2024.  Start carvedilol 6.25 mg BID. Follow up in 2 weeks.           Doreene Nest, NP

## 2023-02-24 NOTE — Patient Instructions (Addendum)
Stop by the lab prior to leaving today. I will notify you of your results once received.   Start carvedilol 6.25 mg twice daily for blood pressure. Take 1 tablet in the morning and 1 tablet in the evening.   Start monitoring your blood pressure daily, around the same time of day, for the next 2-3 weeks.  Ensure that you have rested for 30 minutes prior to checking your blood pressure.   Record your readings and bring them to your next visit.  Please schedule a follow up visit to meet back with me in 2-3 weeks for blood pressure check.   It was a pleasure to see you today!

## 2023-02-24 NOTE — Assessment & Plan Note (Addendum)
Uncontrolled.  Also with chronic sinus tachycardia.   ECG with sinus tachycardia with rate of 109. No ST changes or PAC/PVC. Appears similar to ECG from April 2024  Start carvedilol 6.25 mg BID. Continue losartan 100 mg daily.  Avoid hydrochlorothiazide given CKD.   Repeat BMP pending.   Follow up in 2 weeks for BP check.

## 2023-02-25 ENCOUNTER — Other Ambulatory Visit (INDEPENDENT_AMBULATORY_CARE_PROVIDER_SITE_OTHER): Payer: PPO

## 2023-02-25 DIAGNOSIS — R000 Tachycardia, unspecified: Secondary | ICD-10-CM | POA: Diagnosis not present

## 2023-02-25 LAB — T4, FREE: Free T4: 0.81 ng/dL (ref 0.60–1.60)

## 2023-02-25 LAB — TSH: TSH: 1.85 u[IU]/mL (ref 0.35–5.50)

## 2023-03-10 ENCOUNTER — Ambulatory Visit: Payer: PPO | Admitting: Primary Care

## 2023-03-10 ENCOUNTER — Encounter: Payer: Self-pay | Admitting: Primary Care

## 2023-03-10 VITALS — BP 146/80 | HR 100 | Temp 97.8°F | Ht 66.0 in | Wt 157.0 lb

## 2023-03-10 DIAGNOSIS — R000 Tachycardia, unspecified: Secondary | ICD-10-CM

## 2023-03-10 DIAGNOSIS — R300 Dysuria: Secondary | ICD-10-CM | POA: Diagnosis not present

## 2023-03-10 DIAGNOSIS — I10 Essential (primary) hypertension: Secondary | ICD-10-CM | POA: Diagnosis not present

## 2023-03-10 LAB — POC URINALSYSI DIPSTICK (AUTOMATED)
Bilirubin, UA: NEGATIVE
Blood, UA: POSITIVE
Glucose, UA: NEGATIVE
Ketones, UA: NEGATIVE
Nitrite, UA: NEGATIVE
Protein, UA: NEGATIVE
Spec Grav, UA: 1.015 (ref 1.010–1.025)
Urobilinogen, UA: 0.2 U/dL
pH, UA: 5 (ref 5.0–8.0)

## 2023-03-10 NOTE — Assessment & Plan Note (Signed)
Urinalysis today with 1+ leuks, trace blood. Urine culture ordered and pending.

## 2023-03-10 NOTE — Progress Notes (Signed)
Subjective:    Patient ID: Catherine Tate, female    DOB: 06/01/1940, 82 y.o.   MRN: [id number]  Dysuria  Pertinent negatives include no flank pain, frequency or urgency.    Catherine Tate is a very pleasant 82 y.o. female with a history of hypertension, type 2 diabetes, osteoporosis, recurrent UTI, CKD, urinary incontinence, hyperlipidemia who presents today for follow-up of hypertension. She would also like to discuss dysuria.   1) Hypertension: She was last evaluated on 02/24/23 for continued elevated blood pressure readings despite management on losartan 100 mg daily.  During this visit her blood pressure was 173/90 and 172/88.  She was also noted to have recurrent tachycardia.  Given her elevated blood pressure readings, coupled with tachycardia she was initiated on carvedilol 6.25 mg twice daily.  EKG that day with sinus tachycardia, no changes compared to April 2024.  Since her last visit she is compliant to carvedilol 6.25 mg twice daily. She is checking BP at home which is running 130-150/70-80. She denies dizziness, headaches.   BP Readings from Last 3 Encounters:  03/10/23 (!) 146/80  02/24/23 (!) 172/88  02/24/23 (!) 173/90   2) Dysuria: History of recurrent UTI. Following with urology. Recent symptoms began about 2 weeks ago which include pelvic pressure, mild vaginal irritation with leakage/urination. She denies hematuria, foul smelling urine, flank pain, fevers.    Review of Systems  Constitutional:  Negative for fever.  Respiratory:  Negative for shortness of breath.   Cardiovascular:  Negative for chest pain.  Gastrointestinal:  Negative for abdominal pain.  Genitourinary:  Positive for dysuria. Negative for flank pain, frequency, urgency and vaginal discharge.  Neurological:  Negative for dizziness and headaches.         Past Medical History:  Diagnosis Date   Acute urinary retention 07/20/2022   Arthritis    Cancer (HCC) 03/13/2016   INVASIVE MAMMARY  CARCINOMA   Cataract 02/03/2018   Cellulitis of left lower extremity 09/23/2021   Chest wall abscess 05/15/2017   Chickenpox    Chronic kidney disease, stage 3 (HCC)    Complication of anesthesia    Frequent UTI    GERD (gastroesophageal reflux disease)    RARE   Hyperlipidemia    Hypertension    Osteoporosis    Personal history of chemotherapy 2017/2018   F/U left breast cancer   Personal history of radiation therapy 2018   F/U left breast cancer   PONV (postoperative nausea and vomiting)    RSD (reflex sympathetic dystrophy)    Seasonal allergies    Skin cancer    Type 2 diabetes mellitus with hyperglycemia (HCC)    Urine incontinence     Social History   Socioeconomic History   Marital status: Single    Spouse name: Not on file   Number of children: 2   Years of education: Not on file   Highest education level: Not on file  Occupational History   Occupation: retired  Tobacco Use   Smoking status: Former    Current packs/day: 0.00    Average packs/day: 1 pack/day for 20.0 years (20.0 ttl pk-yrs)    Types: Cigarettes    Start date: 04/14/1974    Quit date: 04/14/1994    Years since quitting: 28.9   Smokeless tobacco: Never  Vaping Use   Vaping status: Never Used  Substance and Sexual Activity   Alcohol use: No    Alcohol/week: 0.0 standard drinks of alcohol   Drug use: No  Sexual activity: Not Currently  Other Topics Concern   Not on file  Social History Narrative   Single.   2 children, 1 grandchild.   Retired. Once worked in a Statistician.   Enjoys making jewelry, puzzle books, sewing, traveling.   Son lives with her   Social Determinants of Health   Financial Resource Strain: Low Risk  (08/18/2022)   Overall Financial Resource Strain (CARDIA)    Difficulty of Paying Living Expenses: Not hard at all  Food Insecurity: No Food Insecurity (08/18/2022)   Hunger Vital Sign    Worried About Running Out of Food in the Last Year: Never true    Ran Out of  Food in the Last Year: Never true  Transportation Needs: No Transportation Needs (08/18/2022)   PRAPARE - Administrator, Civil Service (Medical): No    Lack of Transportation (Non-Medical): No  Physical Activity: Inactive (08/18/2022)   Exercise Vital Sign    Days of Exercise per Week: 0 days    Minutes of Exercise per Session: 0 min  Stress: No Stress Concern Present (08/18/2022)   [employer] of Occupational Health - Occupational Stress Questionnaire    Feeling of Stress : Not at all  Social Connections: Moderately Isolated (08/18/2022)   Social Connection and Isolation Panel [NHANES]    Frequency of Communication with Friends and Family: More than three times a week    Frequency of Social Gatherings with Friends and Family: More than three times a week    Attends Religious Services: More than 4 times per year    Active Member of [employer] or Organizations: No    Attends Banker Meetings: Never    Marital Status: Never married  Intimate Partner Violence: Not At Risk (08/18/2022)   Humiliation, Afraid, Rape, and Kick questionnaire    Fear of Current or Ex-Partner: No    Emotionally Abused: No    Physically Abused: No    Sexually Abused: No    Past Surgical History:  Procedure Laterality Date   ABDOMINAL HYSTERECTOMY  2005   total/ Dr Barnabas Lister   BLADDER TUMOR EXCISION  2006   BREAST BIOPSY Left 03/13/2016   INVASIVE MAMMARY CARCINOMA   BREAST LUMPECTOMY Left 2018   BREAST LUMPECTOMY WITH SENTINEL LYMPH NODE BIOPSY Left 07/14/2016   Procedure: BREAST LUMPECTOMY WITH SENTINEL LYMPH NODE BX;  Surgeon: Earline Mayotte, MD;  Location: [facility];  Service: General;  Laterality: Left;   COLONOSCOPY  2023   COLONOSCOPY WITH PROPOFOL N/A 01/02/2021   Procedure: COLONOSCOPY WITH PROPOFOL;  Surgeon: Earline Mayotte, MD;  Location: [facility];  Service: Endoscopy;  Laterality: N/A;   CYSTOSCOPY WITH URETHRAL DILATATION N/A 10/28/2022   Procedure: CYSTOSCOPY  WITH URETHRAL DILATATION;  Surgeon: Riki Altes, MD;  Location: [facility];  Service: Urology;  Laterality: N/A;   denture surgery     ESOPHAGOGASTRODUODENOSCOPY (EGD) WITH PROPOFOL N/A 07/19/2022   Procedure: ESOPHAGOGASTRODUODENOSCOPY (EGD) WITH PROPOFOL;  Surgeon: Toledo, Boykin Nearing, MD;  Location: [facility];  Service: Gastroenterology;  Laterality: N/A;   PORT-A-CATH REMOVAL     PORTACATH PLACEMENT N/A 04/01/2016   Procedure: INSERTION PORT-A-CATH;  Surgeon: Earline Mayotte, MD;  Location: [facility];  Service: General;  Laterality: N/A;   SKIN CANCER EXCISION Right 2015   foot/ Dr Margo Aye   TUBAL LIGATION     TUMOR EXCISION Right    FOOT    Family History  Problem Relation Age of Onset   Heart disease Mother  Transient ischemic attack Mother    Emphysema Father    Breast cancer Sister    Breast cancer Cousin        paternal   Kidney cancer Neg Hx    Bladder Cancer Neg Hx     Allergies  Allergen Reactions   Bee Pollen Hives   Mosquito (Diagnostic) Hives   Other Other (See Comments)    Uncoded Allergy. Allergen: VICRYL  (90% glycolide and 10% L-lactide [polyglactin-- synthetic absorbable sterile surgical suture]), Other Reaction: Infection   Gabapentin Swelling   Mosquito (Culex Pipiens) Allergy Skin Test Hives   Norvasc [Amlodipine Besylate] Swelling    TO FEET   Lisinopril Cough    Current Outpatient Medications on File Prior to Visit  Medication Sig Dispense Refill   acetaminophen (TYLENOL) 500 MG tablet Take 1,000 mg by mouth every 6 (six) hours as needed for moderate pain.      alendronate (FOSAMAX) 70 MG tablet TAKE ONE TABLET (70 MG TOTAL) BY MOUTH ONCE A WEEK. TAKE WITH A FULL GLASS OF WATER ON AN EMPTY STOMACH 12 tablet 3   Ascorbic Acid (VITAMIN C) 1000 MG tablet Take 1,000 mg by mouth daily.     atorvastatin (LIPITOR) 10 MG tablet TAKE ONE TABLET BY MOUTH ONCE A DAY FOR CHOLSTEROL. 90 tablet 2   Calcium Carb-Cholecalciferol (CALCIUM/VITAMIN D PO) Take  1,200 mg by mouth.     carvedilol (COREG) 6.25 MG tablet Take 1 tablet (6.25 mg total) by mouth 2 (two) times daily with a meal. for blood pressure. 180 tablet 0   cephALEXin (KEFLEX) 250 MG capsule Take 1 capsule (250 mg total) by mouth daily. 30 capsule 6   cetirizine (ZYRTEC) 10 MG tablet Take 1 tablet (10 mg total) by mouth daily as needed.     CRANBERRY PO Take 1 capsule by mouth 2 (two) times daily.     estradiol (ESTRACE) 0.1 MG/GM vaginal cream Apply one pea-sized amount around the opening of the urethra daily for 2 weeks, then 3 times weekly moving forward. 42.5 g 12   losartan (COZAAR) 100 MG tablet TAKE ONE TABLET (100 MG TOTAL) BY MOUTH DAILY. FOR BLOOD PRESSURE. 90 tablet 1   Probiotic Product (PROBIOTIC PO) Take 1 tablet by mouth daily.     albuterol (VENTOLIN HFA) 108 (90 Base) MCG/ACT inhaler Inhale 1-2 puffs into the lungs every 6 (six) hours as needed for wheezing (or cough). (Patient not taking: Reported on 03/10/2023) 8 g 2   No current facility-administered medications on file prior to visit.    BP (!) 146/80   Pulse 100   Temp 97.8 F (36.6 C) (Temporal)   Ht 5\' 6"  (1.676 m)   Wt 157 lb (71.2 kg)   SpO2 96%   BMI 25.34 kg/m  Objective:   Physical Exam Cardiovascular:     Rate and Rhythm: Normal rate and regular rhythm.  Pulmonary:     Effort: Pulmonary effort is normal.     Breath sounds: Normal breath sounds.  Musculoskeletal:     Cervical back: Neck supple.  Skin:    General: Skin is warm and dry.  Neurological:     Mental Status: She is alert and oriented to person, place, and time.  Psychiatric:        Mood and Affect: Mood normal.           Assessment & Plan:  Essential hypertension Assessment & Plan: Improved, but would like to see better control. Also, she is asymptomatic.  Increase carvedilol to 12.5 mg BID. Continue losartan 100 mg daily. Follow up next week.   Dysuria Assessment & Plan: Urinalysis today with 1+ leuks, trace  blood. Urine culture ordered and pending.  Orders: -     POCT Urinalysis Dipstick (Automated) -     Urine Culture  Tachycardia Assessment & Plan: Improved, still above goal.  See ECG from 02/24/23.  Increase carvedilol to 12.5 mg twice daily. Close follow-up next week.         Doreene Nest, NP

## 2023-03-10 NOTE — Patient Instructions (Signed)
We will be in touch once we receive your urine test results.  Increase your carvedilol blood pressure pill.  Take 2 tablets by mouth twice daily for blood pressure.  Schedule a follow-up visit for Friday, December 6.  It was a pleasure to see you today!

## 2023-03-10 NOTE — Assessment & Plan Note (Addendum)
Improved, but would like to see better control. Also, she is asymptomatic.  Increase carvedilol to 12.5 mg BID. Continue losartan 100 mg daily. Follow up next week.

## 2023-03-10 NOTE — Assessment & Plan Note (Signed)
Improved, still above goal.  See ECG from 02/24/23.  Increase carvedilol to 12.5 mg twice daily. Close follow-up next week.

## 2023-03-12 LAB — URINE CULTURE
MICRO NUMBER:: 15782005
SPECIMEN QUALITY:: ADEQUATE

## 2023-03-17 ENCOUNTER — Other Ambulatory Visit: Payer: Self-pay | Admitting: Primary Care

## 2023-03-17 DIAGNOSIS — N3001 Acute cystitis with hematuria: Secondary | ICD-10-CM

## 2023-03-17 MED ORDER — SULFAMETHOXAZOLE-TRIMETHOPRIM 800-160 MG PO TABS
1.0000 | ORAL_TABLET | Freq: Two times a day (BID) | ORAL | 0 refills | Status: DC
Start: 2023-03-17 — End: 2023-06-18

## 2023-03-19 ENCOUNTER — Ambulatory Visit: Payer: PPO | Admitting: Urology

## 2023-03-19 ENCOUNTER — Encounter: Payer: Self-pay | Admitting: Urology

## 2023-03-19 VITALS — BP 155/78 | HR 103 | Ht 66.0 in | Wt 157.0 lb

## 2023-03-19 DIAGNOSIS — N3592 Unspecified urethral stricture, female: Secondary | ICD-10-CM | POA: Diagnosis not present

## 2023-03-19 NOTE — Progress Notes (Signed)
Had PA visit last month and a 28-month follow-up with bladder scan was recommended.  She was inadvertently given a 3-week follow-up appointment.  She was given the option of being seen today or rescheduled in 3 months and elected the latter since she was not having any problems.

## 2023-03-20 ENCOUNTER — Ambulatory Visit (INDEPENDENT_AMBULATORY_CARE_PROVIDER_SITE_OTHER): Payer: PPO | Admitting: Primary Care

## 2023-03-20 ENCOUNTER — Encounter: Payer: Self-pay | Admitting: Primary Care

## 2023-03-20 ENCOUNTER — Other Ambulatory Visit: Payer: Self-pay | Admitting: Primary Care

## 2023-03-20 VITALS — BP 142/74 | HR 85 | Temp 97.2°F | Ht 66.0 in | Wt 158.0 lb

## 2023-03-20 DIAGNOSIS — N3001 Acute cystitis with hematuria: Secondary | ICD-10-CM

## 2023-03-20 DIAGNOSIS — I10 Essential (primary) hypertension: Secondary | ICD-10-CM

## 2023-03-20 MED ORDER — CARVEDILOL 12.5 MG PO TABS
12.5000 mg | ORAL_TABLET | Freq: Two times a day (BID) | ORAL | 1 refills | Status: DC
Start: 2023-03-20 — End: 2023-09-13

## 2023-03-20 NOTE — Assessment & Plan Note (Signed)
Improved.   Continue carvedilol 12.5 mg BID. New Rx sent to pharmacy. Continue losartan 100 mg daily.

## 2023-03-20 NOTE — Progress Notes (Signed)
Subjective:    Patient ID: Catherine Tate, female    DOB: [birth year], 82 y.o.   MRN: [id number]  HPI  Catherine Tate is a very pleasant 82 y.o. female with a history of hypertension, type 2 diabetes, breast cancer, CKD, recurrent UTI, hyperlipidemia, tachycardia who presents today for follow-up of hypertension and tachycardia.  She was last evaluated on 03/10/2023 for follow-up of hypertension.  During this visit her blood pressure readings had improved since the addition of carvedilol 6.25 mg twice daily in addition to her existing losartan 1 mg daily.  Blood pressure was still above goal so we increased her carvedilol to 12.5 mg twice daily.  She is here for follow-up today.  Since her last visit she is compliant to carvedilol 12.5 mg twice daily and losartan 100 mg daily.  Heart rate has reduced to 85. She has noticed slight lightheadedness with sudden movements. She denies headaches, chest pain.   BP Readings from Last 3 Encounters:  03/20/23 (!) 142/74  03/19/23 (!) 155/78  03/10/23 (!) 146/80      Review of Systems  Eyes:  Negative for visual disturbance.  Respiratory:  Negative for shortness of breath.   Cardiovascular:  Negative for chest pain.  Neurological:  Positive for light-headedness. Negative for headaches.         Past Medical History:  Diagnosis Date   Acute urinary retention 07/20/2022   Arthritis    Cancer (HCC) 03/13/2016   INVASIVE MAMMARY CARCINOMA   Cataract 02/03/2018   Cellulitis of left lower extremity 09/23/2021   Chest wall abscess 05/15/2017   Chickenpox    Chronic kidney disease, stage 3 (HCC)    Complication of anesthesia    Frequent UTI    GERD (gastroesophageal reflux disease)    RARE   Hyperlipidemia    Hypertension    Osteoporosis    Personal history of chemotherapy 2017/2018   F/U left breast cancer   Personal history of radiation therapy 2018   F/U left breast cancer   PONV (postoperative nausea and vomiting)    RSD (reflex  sympathetic dystrophy)    Seasonal allergies    Skin cancer    Type 2 diabetes mellitus with hyperglycemia (HCC)    Urine incontinence     Social History   Socioeconomic History   Marital status: Single    Spouse name: Not on file   Number of children: 2   Years of education: Not on file   Highest education level: Not on file  Occupational History   Occupation: retired  Tobacco Use   Smoking status: Former    Current packs/day: 0.00    Average packs/day: 1 pack/day for 20.0 years (20.0 ttl pk-yrs)    Types: Cigarettes    Start date: 04/14/1974    Quit date: 04/14/1994    Years since quitting: 28.9   Smokeless tobacco: Never  Vaping Use   Vaping status: Never Used  Substance and Sexual Activity   Alcohol use: No    Alcohol/week: 0.0 standard drinks of alcohol   Drug use: No   Sexual activity: Not Currently  Other Topics Concern   Not on file  Social History Narrative   Single.   2 children, 1 grandchild.   Retired. Once worked in a Statistician.   Enjoys making jewelry, puzzle books, sewing, traveling.   Son lives with her   Social Determinants of Health   Financial Resource Strain: Low Risk  (08/18/2022)   Overall Physicist, medical Strain (  CARDIA)    Difficulty of Paying Living Expenses: Not hard at all  Food Insecurity: No Food Insecurity (08/18/2022)   Hunger Vital Sign    Worried About Running Out of Food in the Last Year: Never true    Ran Out of Food in the Last Year: Never true  Transportation Needs: No Transportation Needs (08/18/2022)   PRAPARE - Administrator, Civil Service (Medical): No    Lack of Transportation (Non-Medical): No  Physical Activity: Inactive (08/18/2022)   Exercise Vital Sign    Days of Exercise per Week: 0 days    Minutes of Exercise per Session: 0 min  Stress: No Stress Concern Present (08/18/2022)   [employer] of Occupational Health - Occupational Stress Questionnaire    Feeling of Stress : Not at all  Social  Connections: Moderately Isolated (08/18/2022)   Social Connection and Isolation Panel [NHANES]    Frequency of Communication with Friends and Family: More than three times a week    Frequency of Social Gatherings with Friends and Family: More than three times a week    Attends Religious Services: More than 4 times per year    Active Member of [employer] or Organizations: No    Attends Banker Meetings: Never    Marital Status: Never married  Intimate Partner Violence: Not At Risk (08/18/2022)   Humiliation, Afraid, Rape, and Kick questionnaire    Fear of Current or Ex-Partner: No    Emotionally Abused: No    Physically Abused: No    Sexually Abused: No    Past Surgical History:  Procedure Laterality Date   ABDOMINAL HYSTERECTOMY  2005   total/ Dr Barnabas Lister   BLADDER TUMOR EXCISION  2006   BREAST BIOPSY Left 03/13/2016   INVASIVE MAMMARY CARCINOMA   BREAST LUMPECTOMY Left 2018   BREAST LUMPECTOMY WITH SENTINEL LYMPH NODE BIOPSY Left 07/14/2016   Procedure: BREAST LUMPECTOMY WITH SENTINEL LYMPH NODE BX;  Surgeon: Earline Mayotte, MD;  Location: [facility];  Service: General;  Laterality: Left;   COLONOSCOPY  2023   COLONOSCOPY WITH PROPOFOL N/A 01/02/2021   Procedure: COLONOSCOPY WITH PROPOFOL;  Surgeon: Earline Mayotte, MD;  Location: [facility];  Service: Endoscopy;  Laterality: N/A;   CYSTOSCOPY WITH URETHRAL DILATATION N/A 10/28/2022   Procedure: CYSTOSCOPY WITH URETHRAL DILATATION;  Surgeon: Riki Altes, MD;  Location: [facility];  Service: Urology;  Laterality: N/A;   denture surgery     ESOPHAGOGASTRODUODENOSCOPY (EGD) WITH PROPOFOL N/A 07/19/2022   Procedure: ESOPHAGOGASTRODUODENOSCOPY (EGD) WITH PROPOFOL;  Surgeon: Toledo, Boykin Nearing, MD;  Location: [facility];  Service: Gastroenterology;  Laterality: N/A;   PORT-A-CATH REMOVAL     PORTACATH PLACEMENT N/A 04/01/2016   Procedure: INSERTION PORT-A-CATH;  Surgeon: Earline Mayotte, MD;  Location: [facility];   Service: General;  Laterality: N/A;   SKIN CANCER EXCISION Right 2015   foot/ Dr Margo Aye   TUBAL LIGATION     TUMOR EXCISION Right    FOOT    Family History  Problem Relation Age of Onset   Heart disease Mother    Transient ischemic attack Mother    Emphysema Father    Breast cancer Sister    Breast cancer Cousin        paternal   Kidney cancer Neg Hx    Bladder Cancer Neg Hx     Allergies  Allergen Reactions   Bee Pollen Hives   Mosquito (Diagnostic) Hives   Other Other (See Comments)  Uncoded Allergy. Allergen: VICRYL  (90% glycolide and 10% L-lactide [polyglactin-- synthetic absorbable sterile surgical suture]), Other Reaction: Infection   Gabapentin Swelling   Mosquito (Culex Pipiens) Allergy Skin Test Hives   Norvasc [Amlodipine Besylate] Swelling    TO FEET   Lisinopril Cough    Current Outpatient Medications on File Prior to Visit  Medication Sig Dispense Refill   acetaminophen (TYLENOL) 500 MG tablet Take 1,000 mg by mouth every 6 (six) hours as needed for moderate pain.      alendronate (FOSAMAX) 70 MG tablet TAKE ONE TABLET (70 MG TOTAL) BY MOUTH ONCE A WEEK. TAKE WITH A FULL GLASS OF WATER ON AN EMPTY STOMACH 12 tablet 3   Ascorbic Acid (VITAMIN C) 1000 MG tablet Take 1,000 mg by mouth daily.     atorvastatin (LIPITOR) 10 MG tablet TAKE ONE TABLET BY MOUTH ONCE A DAY FOR CHOLSTEROL. 90 tablet 2   Calcium Carb-Cholecalciferol (CALCIUM/VITAMIN D PO) Take 1,200 mg by mouth.     cephALEXin (KEFLEX) 250 MG capsule Take 1 capsule (250 mg total) by mouth daily. 30 capsule 6   cetirizine (ZYRTEC) 10 MG tablet Take 1 tablet (10 mg total) by mouth daily as needed.     CRANBERRY PO Take 1 capsule by mouth 2 (two) times daily.     estradiol (ESTRACE) 0.1 MG/GM vaginal cream Apply one pea-sized amount around the opening of the urethra daily for 2 weeks, then 3 times weekly moving forward. 42.5 g 12   losartan (COZAAR) 100 MG tablet TAKE ONE TABLET (100 MG TOTAL) BY MOUTH  DAILY. FOR BLOOD PRESSURE. 90 tablet 1   Probiotic Product (PROBIOTIC PO) Take 1 tablet by mouth daily.     sulfamethoxazole-trimethoprim (BACTRIM DS) 800-160 MG tablet Take 1 tablet by mouth 2 (two) times daily. For urinary tract infection. 10 tablet 0   albuterol (VENTOLIN HFA) 108 (90 Base) MCG/ACT inhaler Inhale 1-2 puffs into the lungs every 6 (six) hours as needed for wheezing (or cough). (Patient not taking: Reported on 03/20/2023) 8 g 2   No current facility-administered medications on file prior to visit.    BP (!) 142/74   Pulse 85   Temp (!) 97.2 F (36.2 C) (Temporal)   Ht 5\' 6"  (1.676 m)   Wt 158 lb (71.7 kg)   SpO2 95%   BMI 25.50 kg/m  Objective:   Physical Exam Cardiovascular:     Rate and Rhythm: Normal rate and regular rhythm.  Pulmonary:     Effort: Pulmonary effort is normal.     Breath sounds: Normal breath sounds.  Musculoskeletal:     Cervical back: Neck supple.  Skin:    General: Skin is warm and dry.  Neurological:     Mental Status: She is alert and oriented to person, place, and time.  Psychiatric:        Mood and Affect: Mood normal.           Assessment & Plan:  Essential hypertension Assessment & Plan: Improved.   Continue carvedilol 12.5 mg BID. New Rx sent to pharmacy. Continue losartan 100 mg daily.    Orders: -     Carvedilol; Take 1 tablet (12.5 mg total) by mouth 2 (two) times daily with a meal. for blood pressure.  Dispense: 180 tablet; Refill: 1        Doreene Nest, NP

## 2023-03-20 NOTE — Patient Instructions (Signed)
Continue carvedilol blood pressure pill.  I sent a new prescription for carvedilol 12.5 mg tablets to your pharmacy.  When you pick this up only take 1 tablet by mouth twice daily.  Continue your losartan blood pressure pill daily.  It was a pleasure to see you today!

## 2023-04-09 ENCOUNTER — Other Ambulatory Visit: Payer: Self-pay | Admitting: Primary Care

## 2023-04-09 DIAGNOSIS — E785 Hyperlipidemia, unspecified: Secondary | ICD-10-CM

## 2023-04-09 DIAGNOSIS — I10 Essential (primary) hypertension: Secondary | ICD-10-CM

## 2023-04-09 NOTE — Telephone Encounter (Signed)
Copied from CRM [id number]. Topic: Clinical - Medication Refill >> Apr 09, 2023 12:25 PM Denese Killings wrote: Most Recent Primary Care Visit:  Provider: Doreene Nest  Department: [facility]  Visit Type: OFFICE VISIT  Date: 03/20/2023  Medication: atorvastatin (LIPITOR) 10 MG tablet  Has the patient contacted their pharmacy?  (Agent: If no, request that the patient contact the pharmacy for the refill. If patient does not wish to contact the pharmacy document the reason why and proceed with request.) (Agent: If yes, when and what did the pharmacy advise?)  Is this the correct pharmacy for this prescription?  If no, delete pharmacy and type the correct one.  This is the patient's preferred pharmacy:  [facility] [place], [place] - [address] [place] [address] Phone: [phone] Fax: [phone]   Has the prescription been filled recently?   Is the patient out of the medication?   Has the patient been seen for an appointment in the last year OR does the patient have an upcoming appointment?   Can we respond through MyChart?   Agent: Please be advised that Rx refills may take up to 3 business days. We ask that you follow-up with your pharmacy.

## 2023-05-22 ENCOUNTER — Other Ambulatory Visit: Payer: Self-pay | Admitting: Primary Care

## 2023-05-22 DIAGNOSIS — I10 Essential (primary) hypertension: Secondary | ICD-10-CM

## 2023-06-16 ENCOUNTER — Encounter: Payer: Self-pay | Admitting: Primary Care

## 2023-06-16 ENCOUNTER — Ambulatory Visit (INDEPENDENT_AMBULATORY_CARE_PROVIDER_SITE_OTHER): Payer: PPO | Admitting: Primary Care

## 2023-06-16 VITALS — BP 132/84 | HR 84 | Temp 97.4°F | Ht 66.0 in | Wt 159.0 lb

## 2023-06-16 DIAGNOSIS — Z853 Personal history of malignant neoplasm of breast: Secondary | ICD-10-CM

## 2023-06-16 DIAGNOSIS — N1831 Chronic kidney disease, stage 3a: Secondary | ICD-10-CM | POA: Diagnosis not present

## 2023-06-16 DIAGNOSIS — E1165 Type 2 diabetes mellitus with hyperglycemia: Secondary | ICD-10-CM

## 2023-06-16 DIAGNOSIS — N390 Urinary tract infection, site not specified: Secondary | ICD-10-CM

## 2023-06-16 DIAGNOSIS — Z0001 Encounter for general adult medical examination with abnormal findings: Secondary | ICD-10-CM | POA: Diagnosis not present

## 2023-06-16 DIAGNOSIS — R300 Dysuria: Secondary | ICD-10-CM | POA: Diagnosis not present

## 2023-06-16 DIAGNOSIS — E785 Hyperlipidemia, unspecified: Secondary | ICD-10-CM | POA: Diagnosis not present

## 2023-06-16 DIAGNOSIS — K2289 Other specified disease of esophagus: Secondary | ICD-10-CM | POA: Diagnosis not present

## 2023-06-16 DIAGNOSIS — I10 Essential (primary) hypertension: Secondary | ICD-10-CM

## 2023-06-16 DIAGNOSIS — M810 Age-related osteoporosis without current pathological fracture: Secondary | ICD-10-CM | POA: Diagnosis not present

## 2023-06-16 LAB — COMPREHENSIVE METABOLIC PANEL
ALT: 27 U/L (ref 0–35)
AST: 20 U/L (ref 0–37)
Albumin: 4.3 g/dL (ref 3.5–5.2)
Alkaline Phosphatase: 54 U/L (ref 39–117)
BUN: 21 mg/dL (ref 6–23)
CO2: 29 meq/L (ref 19–32)
Calcium: 9.3 mg/dL (ref 8.4–10.5)
Chloride: 104 meq/L (ref 96–112)
Creatinine, Ser: 0.93 mg/dL (ref 0.40–1.20)
GFR: 57.7 mL/min — ABNORMAL LOW (ref >60.00)
Glucose, Bld: 122 mg/dL — ABNORMAL HIGH (ref 70–99)
Potassium: 4.3 meq/L (ref 3.5–5.1)
Sodium: 140 meq/L (ref 135–145)
Total Bilirubin: 0.7 mg/dL (ref 0.2–1.2)
Total Protein: 7.1 g/dL (ref 6.0–8.3)

## 2023-06-16 LAB — POC URINALSYSI DIPSTICK (AUTOMATED)
Bilirubin, UA: NEGATIVE
Blood, UA: POSITIVE
Glucose, UA: NEGATIVE
Ketones, UA: NEGATIVE
Nitrite, UA: NEGATIVE
Protein, UA: NEGATIVE
Spec Grav, UA: 1.01 (ref 1.010–1.025)
Urobilinogen, UA: 0.2 U/dL
pH, UA: 5.5 (ref 5.0–8.0)

## 2023-06-16 LAB — LIPID PANEL
Cholesterol: 151 mg/dL (ref 0–200)
HDL: 51.7 mg/dL (ref >39.00)
LDL Cholesterol: 81 mg/dL (ref 0–99)
NonHDL: 98.86
Total CHOL/HDL Ratio: 3
Triglycerides: 88 mg/dL (ref 0.0–149.0)
VLDL: 17.6 mg/dL (ref 0.0–40.0)

## 2023-06-16 LAB — HEMOGLOBIN A1C: Hgb A1c MFr Bld: 6.5 % (ref 4.6–6.5)

## 2023-06-16 NOTE — Assessment & Plan Note (Signed)
 Mammogram UTD, due again in August 2025.

## 2023-06-16 NOTE — Assessment & Plan Note (Signed)
 Continue atorvastatin 10 mg daily. Repeat lipid panel pending.

## 2023-06-16 NOTE — Assessment & Plan Note (Signed)
Repeat A1C pending. Remain off treatment.  Follow up in 6 months.

## 2023-06-16 NOTE — Assessment & Plan Note (Signed)
 Bone density scan UTD. Repeat in August 2026.  Continue Fosamax 70 mg weekly. Continue calcium and vitamin D.

## 2023-06-16 NOTE — Assessment & Plan Note (Signed)
 Immunizations UTD. Mammogram and bone density scan UTD Colonoscopy UTD, no further screening needed.  Discussed the importance of a healthy diet and regular exercise in order for weight loss, and to reduce the risk of further co-morbidity.  Exam stable. Labs pending.  Follow up in 1 year for repeat physical.

## 2023-06-16 NOTE — Assessment & Plan Note (Addendum)
 Stable. Home readings are lower.   Continue carvedilol 12.5 mg BID, losartan 100 mg daily.  CMP pending.

## 2023-06-16 NOTE — Patient Instructions (Signed)
 Stop by the lab prior to leaving today. I will notify you of your results once received.   We will be in touch with your urine test results on Thursday this week.   Please schedule a follow up visit for 6 months for a diabetes check.  It was a pleasure to see you today!

## 2023-06-16 NOTE — Assessment & Plan Note (Signed)
 Following with urology, reviewed office notes from November 2024.  Continue Estrace vaginal cream.

## 2023-06-16 NOTE — Progress Notes (Signed)
 Subjective:    Patient ID: Catherine Tate, female    DOB: 03/02/1941, 83 y.o.   MRN: [id number]  Dysuria     Catherine Tate is a very pleasant 83 y.o. female who presents today for complete physical and follow up of chronic conditions.  She would also like to discuss dysuria. History of recurrent UTI, urethral stricture, vaginal atrophy. Evaluated by Urology last in November 2024, cystoscopy was offered for which she declined. Last positive urine culture was in November 2024, Enterobacter cloacae complex with pan resistance. Today she discusses that her dysuria is ongoing, chronic. She denies fevers, chills, hematuria.   Immunizations: -Tetanus: Completed in 2012 -Influenza: Completed this season  -Shingles: Completed zostavax -Pneumonia: Completed Prevnar 13 in 2017, Prevnar 20 in 2024  Diet: Fair diet.  Exercise: No regular exercise.  Eye exam: Completes annually  Dental exam: Completes semi-annually    Mammogram: Completed in August 2024 Bone Density Scan: Completed in August 2024  Colonoscopy: Completed in 2022. No further screening needed.  BP Readings from Last 3 Encounters:  06/16/23 132/84  03/20/23 (!) 142/74  03/19/23 (!) 155/78       Review of Systems  Constitutional:  Negative for unexpected weight change.  HENT:  Negative for rhinorrhea.   Respiratory:  Negative for cough and shortness of breath.   Cardiovascular:  Negative for chest pain.  Gastrointestinal:  Negative for constipation and diarrhea.  Genitourinary:  Positive for dysuria. Negative for difficulty urinating.  Musculoskeletal:  Negative for arthralgias and myalgias.  Skin:  Negative for rash.  Allergic/Immunologic: Negative for environmental allergies.  Neurological:  Negative for dizziness and headaches.  Psychiatric/Behavioral:  The patient is not nervous/anxious.          Past Medical History:  Diagnosis Date   Acute urinary retention 07/20/2022   Arthritis    Cancer (HCC)  03/13/2016   INVASIVE MAMMARY CARCINOMA   Cataract 02/03/2018   Cellulitis of left lower extremity 09/23/2021   Chest wall abscess 05/15/2017   Chickenpox    Chronic kidney disease, stage 3 (HCC)    Complication of anesthesia    Frequent UTI    GERD (gastroesophageal reflux disease)    RARE   Hyperlipidemia    Hypertension    Malignant neoplasm of upper-outer quadrant of left breast in female, 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   Osteoporosis    Personal history of chemotherapy 2017/2018   F/U left breast cancer   Personal history of radiation therapy 2018   F/U left breast cancer   PONV (postoperative nausea and vomiting)    RSD (reflex sympathetic dystrophy)    Seasonal allergies    Skin cancer    Type 2 diabetes mellitus with hyperglycemia (HCC)    Urine incontinence     Social History   Socioeconomic History   Marital status: Single    Spouse name: Not on file   Number of children: 2   Years of education: Not on file   Highest education level: Not on file  Occupational History   Occupation: retired  Tobacco Use   Smoking status: Former    Current packs/day: 0.00    Average packs/day: 1 pack/day for 20.0 years (20.0 ttl pk-yrs)    Types: Cigarettes    Start date: 04/14/1974    Quit date: 04/14/1994    Years since quitting: 29.1   Smokeless tobacco: Never  Vaping Use   Vaping status: Never Used  Substance and Sexual Activity   Alcohol use: No  Alcohol/week: 0.0 standard drinks of alcohol   Drug use: No   Sexual activity: Not Currently  Other Topics Concern   Not on file  Social History Narrative   Single.   2 children, 1 grandchild.   Retired. Once worked in a Statistician.   Enjoys making jewelry, puzzle books, sewing, traveling.   Son lives with her   Social Drivers of Health   Financial Resource Strain: Low Risk  (08/18/2022)   Overall Financial Resource Strain (CARDIA)    Difficulty of Paying Living Expenses: Not hard at all  Food  Insecurity: No Food Insecurity (08/18/2022)   Hunger Vital Sign    Worried About Running Out of Food in the Last Year: Never true    Ran Out of Food in the Last Year: Never true  Transportation Needs: No Transportation Needs (08/18/2022)   PRAPARE - Administrator, Civil Service (Medical): No    Lack of Transportation (Non-Medical): No  Physical Activity: Inactive (08/18/2022)   Exercise Vital Sign    Days of Exercise per Week: 0 days    Minutes of Exercise per Session: 0 min  Stress: No Stress Concern Present (08/18/2022)   [employer] of Occupational Health - Occupational Stress Questionnaire    Feeling of Stress : Not at all  Social Connections: Moderately Isolated (08/18/2022)   Social Connection and Isolation Panel [NHANES]    Frequency of Communication with Friends and Family: More than three times a week    Frequency of Social Gatherings with Friends and Family: More than three times a week    Attends Religious Services: More than 4 times per year    Active Member of [employer] or Organizations: No    Attends Banker Meetings: Never    Marital Status: Never married  Intimate Partner Violence: Not At Risk (08/18/2022)   Humiliation, Afraid, Rape, and Kick questionnaire    Fear of Current or Ex-Partner: No    Emotionally Abused: No    Physically Abused: No    Sexually Abused: No    Past Surgical History:  Procedure Laterality Date   ABDOMINAL HYSTERECTOMY  2005   total/ Dr Barnabas Lister   BLADDER TUMOR EXCISION  2006   BREAST BIOPSY Left 03/13/2016   INVASIVE MAMMARY CARCINOMA   BREAST LUMPECTOMY Left 2018   BREAST LUMPECTOMY WITH SENTINEL LYMPH NODE BIOPSY Left 07/14/2016   Procedure: BREAST LUMPECTOMY WITH SENTINEL LYMPH NODE BX;  Surgeon: Earline Mayotte, MD;  Location: [facility];  Service: General;  Laterality: Left;   COLONOSCOPY  2023   COLONOSCOPY WITH PROPOFOL N/A 01/02/2021   Procedure: COLONOSCOPY WITH PROPOFOL;  Surgeon: Earline Mayotte, MD;   Location: [facility];  Service: Endoscopy;  Laterality: N/A;   CYSTOSCOPY WITH URETHRAL DILATATION N/A 10/28/2022   Procedure: CYSTOSCOPY WITH URETHRAL DILATATION;  Surgeon: Riki Altes, MD;  Location: [facility];  Service: Urology;  Laterality: N/A;   denture surgery     ESOPHAGOGASTRODUODENOSCOPY (EGD) WITH PROPOFOL N/A 07/19/2022   Procedure: ESOPHAGOGASTRODUODENOSCOPY (EGD) WITH PROPOFOL;  Surgeon: Toledo, Boykin Nearing, MD;  Location: [facility];  Service: Gastroenterology;  Laterality: N/A;   PORT-A-CATH REMOVAL     PORTACATH PLACEMENT N/A 04/01/2016   Procedure: INSERTION PORT-A-CATH;  Surgeon: Earline Mayotte, MD;  Location: [facility];  Service: General;  Laterality: N/A;   SKIN CANCER EXCISION Right 2015   foot/ Dr Margo Aye   TUBAL LIGATION     TUMOR EXCISION Right    FOOT  Family History  Problem Relation Age of Onset   Heart disease Mother    Transient ischemic attack Mother    Emphysema Father    Breast cancer Sister    Breast cancer Cousin        paternal   Kidney cancer Neg Hx    Bladder Cancer Neg Hx     Allergies  Allergen Reactions   Bee Pollen Hives   Mosquito (Diagnostic) Hives   Other Other (See Comments)    Uncoded Allergy. Allergen: VICRYL  (90% glycolide and 10% L-lactide [polyglactin-- synthetic absorbable sterile surgical suture]), Other Reaction: Infection   Gabapentin Swelling   Mosquito (Culex Pipiens) Allergy Skin Test Hives   Norvasc [Amlodipine Besylate] Swelling    TO FEET   Lisinopril Cough    Current Outpatient Medications on File Prior to Visit  Medication Sig Dispense Refill   acetaminophen (TYLENOL) 500 MG tablet Take 1,000 mg by mouth every 6 (six) hours as needed for moderate pain.      alendronate (FOSAMAX) 70 MG tablet TAKE ONE TABLET (70 MG TOTAL) BY MOUTH ONCE A WEEK. TAKE WITH A FULL GLASS OF WATER ON AN EMPTY STOMACH 12 tablet 3   Ascorbic Acid (VITAMIN C) 1000 MG tablet Take 1,000 mg by mouth daily.     atorvastatin  (LIPITOR) 10 MG tablet TAKE ONE TABLET BY MOUTH ONCE A DAY FOR CHOLSTEROL. 90 tablet 0   Calcium Carb-Cholecalciferol (CALCIUM/VITAMIN D PO) Take 1,200 mg by mouth.     carvedilol (COREG) 12.5 MG tablet Take 1 tablet (12.5 mg total) by mouth 2 (two) times daily with a meal. for blood pressure. 180 tablet 1   cephALEXin (KEFLEX) 250 MG capsule Take 1 capsule (250 mg total) by mouth daily. 30 capsule 6   cetirizine (ZYRTEC) 10 MG tablet Take 1 tablet (10 mg total) by mouth daily as needed.     CRANBERRY PO Take 1 capsule by mouth 2 (two) times daily.     estradiol (ESTRACE) 0.1 MG/GM vaginal cream Apply one pea-sized amount around the opening of the urethra daily for 2 weeks, then 3 times weekly moving forward. 42.5 g 12   losartan (COZAAR) 100 MG tablet TAKE ONE TABLET (100 MG TOTAL) BY MOUTH DAILY. FOR BLOOD PRESSURE. 90 tablet 0   Probiotic Product (PROBIOTIC PO) Take 1 tablet by mouth daily.     albuterol (VENTOLIN HFA) 108 (90 Base) MCG/ACT inhaler Inhale 1-2 puffs into the lungs every 6 (six) hours as needed for wheezing (or cough). (Patient not taking: Reported on 06/16/2023) 8 g 2   sulfamethoxazole-trimethoprim (BACTRIM DS) 800-160 MG tablet Take 1 tablet by mouth 2 (two) times daily. For urinary tract infection. (Patient not taking: Reported on 06/16/2023) 10 tablet 0   No current facility-administered medications on file prior to visit.    BP 132/84   Pulse 84   Temp (!) 97.4 F (36.3 C) (Temporal)   Ht 5\' 6"  (1.676 m)   Wt 159 lb (72.1 kg)   SpO2 100%   BMI 25.66 kg/m  Objective:   Physical Exam HENT:     Right Ear: Tympanic membrane and ear canal normal.     Left Ear: Tympanic membrane and ear canal normal.  Eyes:     Pupils: Pupils are equal, round, and reactive to light.  Cardiovascular:     Rate and Rhythm: Normal rate and regular rhythm.  Pulmonary:     Effort: Pulmonary effort is normal.     Breath sounds: Normal  breath sounds.  Abdominal:     General: Bowel sounds  are normal.     Palpations: Abdomen is soft.     Tenderness: There is no abdominal tenderness.  Musculoskeletal:        General: Normal range of motion.     Cervical back: Neck supple.  Skin:    General: Skin is warm and dry.  Neurological:     Mental Status: She is alert and oriented to person, place, and time.     Cranial Nerves: No cranial nerve deficit.     Deep Tendon Reflexes:     Reflex Scores:      Patellar reflexes are 2+ on the right side and 2+ on the left side. Psychiatric:        Mood and Affect: Mood normal.           Assessment & Plan:  Encounter for annual general medical examination with abnormal findings in adult Assessment & Plan: Immunizations UTD. Mammogram and bone density scan UTD Colonoscopy UTD, no further screening needed.  Discussed the importance of a healthy diet and regular exercise in order for weight loss, and to reduce the risk of further co-morbidity.  Exam stable. Labs pending.  Follow up in 1 year for repeat physical.    Dysuria Assessment & Plan: UA today with 1+ leuks, trace blood. Culture pending.  Await results.   Orders: -     POCT Urinalysis Dipstick (Automated) -     Urine Culture  Essential hypertension Assessment & Plan: Stable. Home readings are lower.   Continue carvedilol 12.5 mg BID, losartan 100 mg daily.  CMP pending.   Presbyesophagus Assessment & Plan: Controlled.  Continue to monitor.    Type 2 diabetes mellitus with hyperglycemia, without long-term current use of insulin (HCC) Assessment & Plan: Repeat A1C pending.  Remain off treatment.  Follow up in 6 months.  Orders: -     Hemoglobin A1c  Osteoporosis, unspecified osteoporosis type, unspecified pathological fracture presence Assessment & Plan: Bone density scan UTD. Repeat in August 2026.  Continue Fosamax 70 mg weekly. Continue calcium and vitamin D.     Stage 3a chronic kidney disease (HCC) Assessment & Plan: Continue  losartan 100 mg daily. CMP pending.   Recurrent UTI Assessment & Plan: Following with urology, reviewed office notes from November 2024.  Continue Estrace vaginal cream.   History of breast cancer Assessment & Plan: Mammogram UTD, due again in August 2025.   Hyperlipidemia, unspecified hyperlipidemia type Assessment & Plan: Continue atorvastatin 10 mg daily. Repeat lipid panel pending.  Orders: -     Comprehensive metabolic panel -     Lipid panel        Doreene Nest, NP

## 2023-06-16 NOTE — Assessment & Plan Note (Signed)
 UA today with 1+ leuks, trace blood. Culture pending.  Await results.

## 2023-06-16 NOTE — Assessment & Plan Note (Signed)
 Controlled.  Continue to monitor.

## 2023-06-16 NOTE — Assessment & Plan Note (Signed)
 Continue losartan 100 mg daily. CMP pending.

## 2023-06-17 ENCOUNTER — Ambulatory Visit: Payer: PPO | Admitting: Urology

## 2023-06-18 ENCOUNTER — Other Ambulatory Visit: Payer: Self-pay | Admitting: Primary Care

## 2023-06-18 ENCOUNTER — Other Ambulatory Visit: Payer: Self-pay | Admitting: Oncology

## 2023-06-18 DIAGNOSIS — N3001 Acute cystitis with hematuria: Secondary | ICD-10-CM

## 2023-06-18 LAB — URINE CULTURE
MICRO NUMBER:: 16156646
SPECIMEN QUALITY:: ADEQUATE

## 2023-06-18 MED ORDER — CIPROFLOXACIN HCL 500 MG PO TABS: 500.0000 mg | ORAL_TABLET | Freq: Two times a day (BID) | ORAL | 0 refills | Status: AC

## 2023-06-24 ENCOUNTER — Ambulatory Visit: Payer: PPO | Admitting: Urology

## 2023-06-24 VITALS — BP 141/78 | HR 96 | Ht 67.0 in | Wt 158.0 lb

## 2023-06-24 DIAGNOSIS — N3592 Unspecified urethral stricture, female: Secondary | ICD-10-CM | POA: Diagnosis not present

## 2023-06-24 DIAGNOSIS — R300 Dysuria: Secondary | ICD-10-CM

## 2023-06-24 LAB — MICROSCOPIC EXAMINATION: Bacteria, UA: NONE SEEN

## 2023-06-24 LAB — URINALYSIS, COMPLETE
Bilirubin, UA: NEGATIVE
Glucose, UA: NEGATIVE
Ketones, UA: NEGATIVE
Leukocytes,UA: NEGATIVE
Nitrite, UA: NEGATIVE
Protein,UA: NEGATIVE
RBC, UA: NEGATIVE
Specific Gravity, UA: <1.005 — ABNORMAL LOW (ref 1.005–1.030)
Urobilinogen, Ur: 0.2 mg/dL (ref 0.2–1.0)
pH, UA: 5.5 (ref 5.0–7.5)

## 2023-06-24 LAB — BLADDER SCAN AMB NON-IMAGING: Scan Result: 0

## 2023-06-24 MED ORDER — CIPROFLOXACIN HCL 250 MG PO TABS: 250.0000 mg | ORAL_TABLET | Freq: Two times a day (BID) | ORAL | 0 refills | Status: AC

## 2023-06-24 NOTE — Progress Notes (Signed)
 I, Maysun Anabel Bene, acting as a scribe for Riki Altes, MD., have documented all relevant documentation on the behalf of Riki Altes, MD, as directed by Riki Altes, MD while in the presence of Riki Altes, MD.  06/24/2023 11:16 AM   Catherine Tate [birth year] [id number]  Referring provider: Doreene Nest, NP [address] [place],  [place] [address]  Chief Complaint  Patient presents with   Other   Urologic history: Recurrent urethral stricture Dilation with catheter placement initially 07/18/22 and OR 10/28/22.   HPI: Catherine Tate is a 83 y.o. female presents for a 3 month follow-up.  History of recurrent urethral stricture status post- dilation with catheter placement initially 07/18/22 and OR 10/28/22.  She refused to learn CIC and was last seen November 2024 with a PVR 0 mL.  Saw her PCP last week complaining of dysuria. Urine culture was positive for neurobacteria, and she was treated with a 3 day course of Cipro. She states her symptoms have improved, though she is still having mild dysuria.  She states she is voiding with a good stream.   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   Osteoporosis    Personal history of chemotherapy 2017/2018   F/U left breast cancer   Personal history of radiation therapy 2018   F/U left breast cancer   PONV (postoperative nausea and vomiting)    RSD (reflex sympathetic dystrophy)    Seasonal allergies     Skin cancer    Type 2 diabetes mellitus with hyperglycemia (HCC)    Urine incontinence     Surgical History: Past Surgical History:  Procedure Laterality Date   ABDOMINAL HYSTERECTOMY  2005   total/ Dr Barnabas Lister   BLADDER TUMOR EXCISION  2006   BREAST BIOPSY Left 03/13/2016   INVASIVE MAMMARY CARCINOMA   BREAST LUMPECTOMY Left 2018   BREAST LUMPECTOMY WITH SENTINEL LYMPH NODE BIOPSY Left 07/14/2016   Procedure: BREAST LUMPECTOMY WITH SENTINEL LYMPH NODE BX;  Surgeon: Earline Mayotte, MD;  Location: [facility];  Service: General;  Laterality: Left;   COLONOSCOPY  2023   COLONOSCOPY WITH PROPOFOL N/A 01/02/2021   Procedure: COLONOSCOPY WITH PROPOFOL;  Surgeon: Earline Mayotte, MD;  Location: [facility];  Service: Endoscopy;  Laterality: N/A;   CYSTOSCOPY WITH URETHRAL DILATATION N/A 10/28/2022   Procedure: CYSTOSCOPY WITH URETHRAL DILATATION;  Surgeon: Riki Altes, MD;  Location: [facility];  Service: Urology;  Laterality: N/A;   denture surgery     ESOPHAGOGASTRODUODENOSCOPY (EGD) WITH PROPOFOL N/A 07/19/2022   Procedure: ESOPHAGOGASTRODUODENOSCOPY (EGD) WITH PROPOFOL;  Surgeon: Toledo, Boykin Nearing, MD;  Location: [facility];  Service: Gastroenterology;  Laterality: N/A;   PORT-A-CATH REMOVAL     PORTACATH PLACEMENT N/A 04/01/2016   Procedure: INSERTION PORT-A-CATH;  Surgeon: Earline Mayotte, MD;  Location: [facility];  Service: General;  Laterality: N/A;   SKIN CANCER EXCISION Right  2015   foot/ Dr Margo Aye   TUBAL LIGATION     TUMOR EXCISION Right    FOOT    Home Medications:  Allergies as of 06/24/2023       Reactions   Bee Pollen Hives   Mosquito (diagnostic) Hives   Other Other (See Comments)   Uncoded Allergy. Allergen: VICRYL  (90% glycolide and 10% L-lactide [polyglactin-- synthetic absorbable sterile surgical suture]), Other Reaction: Infection   Gabapentin Swelling   Mosquito (culex Pipiens) Allergy Skin Test Hives   Norvasc [amlodipine Besylate] Swelling    TO FEET   Lisinopril Cough        Medication List        Accurate as of June 24, 2023 11:16 AM. If you have any questions, ask your nurse or doctor.          STOP taking these medications    albuterol 108 (90 Base) MCG/ACT inhaler Commonly known as: VENTOLIN HFA   cephALEXin 250 MG capsule Commonly known as: Keflex       TAKE these medications    acetaminophen 500 MG tablet Commonly known as: TYLENOL Take 1,000 mg by mouth every 6 (six) hours as needed for moderate pain.   alendronate 70 MG tablet Commonly known as: FOSAMAX TAKE ONE TABLET (70 MG TOTAL) BY MOUTH ONCE A WEEK. TAKE WITH A FULL GLASS OF WATER ON AN EMPTY STOMACH   atorvastatin 10 MG tablet Commonly known as: LIPITOR TAKE ONE TABLET BY MOUTH ONCE A DAY FOR CHOLSTEROL.   CALCIUM/VITAMIN D PO Take 1,200 mg by mouth.   carvedilol 12.5 MG tablet Commonly known as: COREG Take 1 tablet (12.5 mg total) by mouth 2 (two) times daily with a meal. for blood pressure.   cetirizine 10 MG tablet Commonly known as: ZYRTEC Take 1 tablet (10 mg total) by mouth daily as needed.   ciprofloxacin 250 MG tablet Commonly known as: Cipro Take 1 tablet (250 mg total) by mouth 2 (two) times daily for 5 days.   CRANBERRY PO Take 1 capsule by mouth 2 (two) times daily.   estradiol 0.1 MG/GM vaginal cream Commonly known as: ESTRACE Apply one pea-sized amount around the opening of the urethra daily for 2 weeks, then 3 times weekly moving forward.   losartan 100 MG tablet Commonly known as: COZAAR TAKE ONE TABLET (100 MG TOTAL) BY MOUTH DAILY. FOR BLOOD PRESSURE.   PROBIOTIC PO Take 1 tablet by mouth daily.   vitamin C 1000 MG tablet Take 1,000 mg by mouth daily.        Allergies:  Allergies  Allergen Reactions   Bee Pollen Hives   Mosquito (Diagnostic) Hives   Other Other (See Comments)    Uncoded Allergy. Allergen: VICRYL  (90% glycolide and 10% L-lactide [polyglactin-- synthetic absorbable  sterile surgical suture]), Other Reaction: Infection   Gabapentin Swelling   Mosquito (Culex Pipiens) Allergy Skin Test Hives   Norvasc [Amlodipine Besylate] Swelling    TO FEET   Lisinopril Cough    Family History: Family History  Problem Relation Age of Onset   Heart disease Mother    Transient ischemic attack Mother    Emphysema Father    Breast cancer Sister    Breast cancer Cousin        paternal   Kidney cancer Neg Hx    Bladder Cancer Neg Hx     Social History:  reports that she quit smoking about 29 years ago. Her smoking use included cigarettes. She started smoking  about 49 years ago. She has a 20 pack-year smoking history. She has never used smokeless tobacco. She reports that she does not drink alcohol and does not use drugs.   Physical Exam: BP (!) 141/78   Pulse 96   Ht 5\' 7"  (1.702 m)   Wt 158 lb (71.7 kg)   BMI 24.75 kg/m   Constitutional:  Alert and oriented, No acute distress. HEENT: [place] AT, moist mucus membranes.  Trachea midline, no masses. Cardiovascular: No clubbing, cyanosis, or edema. Respiratory: Normal respiratory effort, no increased work of breathing. GI: Abdomen is soft, nontender, nondistended, no abdominal masses Skin: No rashes, bruises or suspicious lesions. Neurologic: Grossly intact, no focal deficits, moving all 4 extremities. Psychiatric: Normal mood and affect.   Urinalysis Dipstick/microscopy negative   Assessment & Plan:    1. Recurrent urethral stricture Although UA today clear she is having mild dysuria, and since this would not be a simple cystitis, will treat with an additional 5 day course of Cypro 250 mg BID.  PVR today 0 mL.  Follow up 6 months for repeat PVR and instructed to call earlier for development of obstructive voiding symptoms.  [facility] [address], [address] [place], [place] [address] [phone]

## 2023-06-26 ENCOUNTER — Encounter: Payer: Self-pay | Admitting: Urology

## 2023-07-06 ENCOUNTER — Other Ambulatory Visit: Payer: Self-pay | Admitting: Primary Care

## 2023-07-06 DIAGNOSIS — E785 Hyperlipidemia, unspecified: Secondary | ICD-10-CM

## 2023-07-06 DIAGNOSIS — I10 Essential (primary) hypertension: Secondary | ICD-10-CM

## 2023-07-13 ENCOUNTER — Other Ambulatory Visit: Payer: Self-pay | Admitting: Primary Care

## 2023-07-13 DIAGNOSIS — E785 Hyperlipidemia, unspecified: Secondary | ICD-10-CM

## 2023-07-14 ENCOUNTER — Other Ambulatory Visit: Payer: Self-pay | Admitting: Primary Care

## 2023-07-14 DIAGNOSIS — M810 Age-related osteoporosis without current pathological fracture: Secondary | ICD-10-CM

## 2023-07-14 DIAGNOSIS — M8580 Other specified disorders of bone density and structure, unspecified site: Secondary | ICD-10-CM

## 2023-08-14 ENCOUNTER — Other Ambulatory Visit: Payer: Self-pay | Admitting: Physician Assistant

## 2023-08-14 DIAGNOSIS — N390 Urinary tract infection, site not specified: Secondary | ICD-10-CM

## 2023-09-01 ENCOUNTER — Telehealth: Payer: Self-pay

## 2023-09-01 ENCOUNTER — Ambulatory Visit (INDEPENDENT_AMBULATORY_CARE_PROVIDER_SITE_OTHER)

## 2023-09-01 VITALS — BP 127/78 | Ht 67.0 in | Wt 159.0 lb

## 2023-09-01 DIAGNOSIS — Z0000 Encounter for general adult medical examination without abnormal findings: Secondary | ICD-10-CM | POA: Diagnosis not present

## 2023-09-01 DIAGNOSIS — Z2821 Immunization not carried out because of patient refusal: Secondary | ICD-10-CM

## 2023-09-01 DIAGNOSIS — Z1231 Encounter for screening mammogram for malignant neoplasm of breast: Secondary | ICD-10-CM

## 2023-09-01 DIAGNOSIS — E2839 Other primary ovarian failure: Secondary | ICD-10-CM

## 2023-09-01 NOTE — Telephone Encounter (Signed)
 Copied from CRM [id number]. Topic: Appointments - Appointment Scheduling >> Sep 01, 2023 10:37 AM Howard Macho wrote: Patient/patient representative is calling to schedule an appointment. Refer to attachments for appointment information.   Patient called stating she called to make an appointment for the bone density test and the mammogram but [facility] does not have the order  CB [phone]

## 2023-09-01 NOTE — Progress Notes (Signed)
 Because this visit was a virtual/telehealth visit,  certain criteria was not obtained, such a blood pressure, CBG if applicable, and timed get up and go. Any medications not marked as "taking" were not mentioned during the medication reconciliation part of the visit. Any vitals not documented were not able to be obtained due to this being a telehealth visit or patient was unable to self-report a recent blood pressure reading due to a lack of equipment at home via telehealth. Vitals that have been documented are verbally provided by the patient.   This visit was performed by a medical professional under my direct supervision. I was immediately available for consultation/collaboration. I have reviewed and agree with the Annual Wellness Visit documentation.  Subjective:   Catherine Tate is a 83 y.o. who presents for a Medicare Wellness preventive visit.  As a reminder, Annual Wellness Visits don't include a physical exam, and some assessments may be limited, especially if this visit is performed virtually. We may recommend an in-person follow-up visit with your provider if needed.  Visit Complete: Virtual I connected with  Catherine Tate on 09/01/23 by a audio enabled telemedicine application and verified that I am speaking with the correct person using two identifiers.  Patient Location: Home  Provider Location: Home Office  I discussed the limitations of evaluation and management by telemedicine. The patient expressed understanding and agreed to proceed.  Vital Signs: Because this visit was a virtual/telehealth visit, some criteria may be missing or patient reported. Any vitals not documented were not able to be obtained and vitals that have been documented are patient reported.  VideoDeclined- This patient declined Librarian, academic. Therefore the visit was completed with audio only.  Persons Participating in Visit: Patient.  AWV Questionnaire: No: Patient Medicare  AWV questionnaire was not completed prior to this visit.  Cardiac Risk Factors include: advanced age (>76men, >36 women);hypertension;diabetes mellitus;dyslipidemia     Objective:     Today's Vitals   09/01/23 1022  BP: 127/78  Weight: 159 lb (72.1 kg)  Height: 5\' 7"  (1.702 m)  PainSc: 5    Body mass index is 24.9 kg/m.     09/01/2023   10:21 AM 12/02/2022    9:44 AM 10/28/2022    6:42 AM 10/20/2022   12:48 PM 08/18/2022    8:37 AM 07/19/2022   10:00 AM 11/27/2021   10:43 AM  Advanced Directives  Does Patient Have a Medical Advance Directive? Yes Yes Yes Yes Yes No Yes  Type of Estate agent of [place];Living will Healthcare Power of [place];Living will Healthcare Power of [place];Living will Healthcare Power of [place];Living will Healthcare Power of [place];Living will  Healthcare Power of [place];Living will  Does patient want to make changes to medical advance directive? No - Patient declined  No - Patient declined  No - Patient declined    Copy of Healthcare Power of Attorney in Chart? Yes - validated most recent copy scanned in chart (See row information)  Yes - validated most recent copy scanned in chart (See row information) Yes - validated most recent copy scanned in chart (See row information) Yes - validated most recent copy scanned in chart (See row information)  Yes - validated most recent copy scanned in chart (See row information)  Would patient like information on creating a medical advance directive?      No - Patient declined     Current Medications (verified) Outpatient Encounter Medications as of 09/01/2023  Medication Sig  acetaminophen  (TYLENOL ) 500 MG tablet Take 1,000 mg by mouth every 6 (six) hours as needed for moderate pain.    alendronate  (FOSAMAX ) 70 MG tablet TAKE ONE TABLET (70 MG TOTAL) BY MOUTH ONCE A WEEK. TAKE WITH A FULL GLASS OF WATER ON AN EMPTY STOMACH   Ascorbic Acid  (VITAMIN C ) 1000 MG tablet Take 1,000 mg by mouth  daily.   atorvastatin  (LIPITOR) 10 MG tablet TAKE ONE TABLET BY MOUTH ONCE A DAY FOR CHOLSTEROL.   Calcium  Carb-Cholecalciferol (CALCIUM /VITAMIN D  PO) Take 1,200 mg by mouth.   carvedilol  (COREG ) 12.5 MG tablet Take 1 tablet (12.5 mg total) by mouth 2 (two) times daily with a meal. for blood pressure.   cetirizine  (ZYRTEC ) 10 MG tablet Take 1 tablet (10 mg total) by mouth daily as needed.   CRANBERRY PO Take 1 capsule by mouth 2 (two) times daily.   estradiol  (ESTRACE ) 0.1 MG/GM vaginal cream Apply one pea-sized amount around the opening of the urethra daily for 2 weeks, then 3 times weekly moving forward.   losartan  (COZAAR ) 100 MG tablet TAKE ONE TABLET (100 MG TOTAL) BY MOUTH DAILY. FOR BLOOD PRESSURE.   Probiotic Product (PROBIOTIC PO) Take 1 tablet by mouth daily.   No facility-administered encounter medications on file as of 09/01/2023.    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   Osteoporosis    Personal history of chemotherapy 2017/2018   F/U left breast cancer   Personal history of radiation therapy 2018   F/U left breast cancer   PONV (postoperative nausea and vomiting)    RSD (reflex sympathetic dystrophy)    Seasonal allergies    Skin cancer    Type 2 diabetes mellitus with hyperglycemia (HCC)     Urine incontinence    Past Surgical History:  Procedure Laterality Date   ABDOMINAL HYSTERECTOMY  2005   total/ Dr [place]    BLADDER TUMOR EXCISION  2006   BREAST BIOPSY Left 03/13/2016   INVASIVE MAMMARY CARCINOMA   BREAST LUMPECTOMY Left 2018   BREAST LUMPECTOMY WITH SENTINEL LYMPH NODE BIOPSY Left 07/14/2016   Procedure: BREAST LUMPECTOMY WITH SENTINEL LYMPH NODE BX;  Surgeon: Marshall Skeeter, MD;  Location: [facility];  Service: General;  Laterality: Left;   COLONOSCOPY  2023   COLONOSCOPY WITH PROPOFOL  N/A 01/02/2021   Procedure: COLONOSCOPY WITH PROPOFOL ;  Surgeon: Marshall Skeeter, MD;  Location: [facility];  Service: Endoscopy;  Laterality: N/A;   CYSTOSCOPY WITH URETHRAL DILATATION N/A 10/28/2022   Procedure: CYSTOSCOPY WITH URETHRAL DILATATION;  Surgeon: Geraline Knapp, MD;  Location: [facility];  Service: Urology;  Laterality: N/A;   denture surgery     ESOPHAGOGASTRODUODENOSCOPY (EGD) WITH PROPOFOL  N/A 07/19/2022   Procedure: ESOPHAGOGASTRODUODENOSCOPY (EGD) WITH PROPOFOL ;  Surgeon: Toledo, Alphonsus Jeans, MD;  Location: [facility];  Service: Gastroenterology;  Laterality: N/A;   PORT-A-CATH REMOVAL     PORTACATH PLACEMENT N/A 04/01/2016   Procedure: INSERTION PORT-A-CATH;  Surgeon: Marshall Skeeter, MD;  Location: [facility];  Service: General;  Laterality: N/A;   SKIN CANCER EXCISION Right 2015   foot/ Dr Del Favia   TUBAL LIGATION     TUMOR EXCISION Right    FOOT   Family History  Problem Relation Age of Onset   Heart disease Mother    Transient ischemic attack Mother    Emphysema Father    Breast cancer Sister    Breast cancer Cousin        paternal   Kidney cancer Neg Hx    Bladder Cancer Neg Hx    Social History   Socioeconomic History   Marital status: Single    Spouse name: Not on file   Number of children: 2   Years of education: Not on file   Highest education level: Not on file  Occupational History   Occupation: retired  Tobacco Use    Smoking status: Former    Current packs/day: 0.00    Average packs/day: 1 pack/day for 20.0 years (20.0 ttl pk-yrs)    Types: Cigarettes    Start date: 04/14/1974    Quit date: 04/14/1994    Years since quitting: 29.4   Smokeless tobacco: Never  Vaping Use   Vaping status: Never Used  Substance and Sexual Activity   Alcohol use: No    Alcohol/week: 0.0 standard drinks of alcohol   Drug use: No   Sexual activity: Not Currently  Other Topics Concern   Not on file  Social History Narrative   Single.   2 children, 1 grandchild.   Retired. Once worked in a Statistician.   Enjoys making jewelry, puzzle books, sewing, traveling.   Son lives with her   Social Drivers of Health   Financial Resource Strain: Low Risk  (08/18/2022)   Overall Financial Resource Strain (CARDIA)    Difficulty of Paying Living Expenses: Not hard at all  Food Insecurity: No Food Insecurity (09/01/2023)   Hunger Vital Sign    Worried About Running Out of Food in the Last Year: Never true    Ran Out of Food in the Last Year: Never true  Transportation Needs: No Transportation Needs (08/18/2022)   PRAPARE - Administrator, Civil Service (Medical): No    Lack of Transportation (Non-Medical): No  Physical Activity: Inactive (08/18/2022)   Exercise Vital Sign    Days of Exercise per Week: 0 days    Minutes of Exercise per Session: 0 min  Stress: No Stress Concern Present (08/18/2022)   [employer] of Occupational Health - Occupational Stress Questionnaire    Feeling of Stress : Not at all  Social Connections: Moderately Isolated (08/18/2022)   Social Connection and Isolation Panel [NHANES]    Frequency of Communication with Friends and Family: More than three times a week    Frequency of Social Gatherings with Friends and Family: More than three times a week    Attends Religious Services: More than 4 times per year    Active Member of [employer] or Organizations: No    Attends Banker  Meetings: Never    Marital Status: Never married    Tobacco Counseling Counseling given: Not Answered    Clinical Intake:  Pre-visit preparation completed: Yes  Pain : 0-10 Pain Score: 5  Pain Type: Acute pain Pain Location: Back Pain Orientation: Lower Pain Descriptors /  Indicators: Aching Pain Onset: Today Pain Frequency: Occasional     BMI - recorded: 24.9 Nutritional Status: BMI of 19-24  Normal Nutritional Risks: None Diabetes: No  Lab Results  Component Value Date   HGBA1C 6.5 06/16/2023   HGBA1C 6.1 (A) 12/16/2022   HGBA1C 6.6 (H) 06/10/2022     How often do you need to have someone help you when you read instructions, pamphlets, or other written materials from your doctor or pharmacy?: 1 - Never What is the last grade level you completed in school?: HS Graduate  Interpreter Needed?: No  Information entered by :: Juliann Ochoa   Activities of Daily Living     09/01/2023   10:27 AM 10/20/2022   12:47 PM  In your present state of health, do you have any difficulty performing the following activities:  Hearing? 0 0  Vision? 0 0  Difficulty concentrating or making decisions? 0 0  Walking or climbing stairs? 0 1  Dressing or bathing? 0 0  Doing errands, shopping? 0   Preparing Food and eating ? N   Using the Toilet? N   In the past six months, have you accidently leaked urine? Y   Do you have problems with loss of bowel control? N   Managing your Medications? N   Managing your Finances? N   Housekeeping or managing your Housekeeping? N     Patient Care Team: Gabriel John, NP as PCP - General (Internal Medicine) Shellie Dials, MD as Consulting Physician (Oncology)  Indicate any recent Medical Services you may have received from other than [facility] providers in the past year (date may be approximate).     Assessment:    This is a routine wellness examination for [place].  Hearing/Vision screen Hearing Screening - Comments:: No  hearing difficulties  Vision Screening - Comments:: Patient wears glasses    Goals Addressed               This Visit's Progress     Patient Stated (pt-stated)        Visit family       Depression Screen     09/01/2023   10:29 AM 06/16/2023    8:05 AM 01/26/2023    2:31 PM 01/26/2023    2:13 PM 12/16/2022    8:30 AM 09/19/2022    7:31 AM 08/18/2022    8:36 AM  PHQ 2/9 Scores  PHQ - 2 Score 0 0 1 1 1  0 0  PHQ- 9 Score 2  3 3 4 5  0    Fall Risk     09/01/2023   10:26 AM 06/16/2023    8:14 AM 06/16/2023    8:05 AM 03/10/2023    8:01 AM 01/26/2023    2:31 PM  Fall Risk   Falls in the past year? 0 0 0 1 1  Number falls in past yr: 0 0 0 0 1  Injury with Fall? 0 0 0 0 0  Risk for fall due to : No Fall Risks No Fall Risks No Fall Risks History of fall(s) History of fall(s)  Follow up Falls prevention discussed;Falls evaluation completed Falls evaluation completed Falls evaluation completed Falls evaluation completed Falls evaluation completed    MEDICARE RISK AT HOME:  Medicare Risk at Home Any stairs in or around the home?: Yes If so, are there any without handrails?: No Home free of loose throw rugs in walkways, pet beds, electrical cords, etc?: Yes Adequate lighting in your home to reduce  risk of falls?: Yes Life alert?: No Use of a cane, walker or w/c?: No Grab bars in the bathroom?: Yes Shower chair or bench in shower?: Yes Elevated toilet seat or a handicapped toilet?: Yes  TIMED UP AND GO:  Was the test performed?  No  Cognitive Function: 6CIT completed    02/17/2020   10:37 AM 02/09/2019    9:52 AM 02/03/2018    8:47 AM 02/02/2017   10:05 AM  MMSE - Mini Mental State Exam  Orientation to time 5 5 5 5   Orientation to Place 5 5 5 5   Registration 3 3 3 3   Attention/ Calculation 5 5 0 0  Recall 3 3 3 2   Recall-comments    unable to recall 1 of 3 words  Language- name 2 objects   0 0  Language- repeat 1 1 1 1   Language- follow 3 step command   3 2   Language- follow 3 step command-comments    unable to follow 1 step of 3 step command  Language- read & follow direction   0 0  Write a sentence   0 0  Copy design   0 0  Total score   20 18        09/01/2023   10:24 AM 08/18/2022    8:40 AM 08/14/2021    9:24 AM  6CIT Screen  What Year? 0 points 0 points 0 points  What month? 0 points 0 points 0 points  What time? 0 points 0 points 0 points  Count back from 20 0 points 0 points 0 points  Months in reverse 0 points 0 points 0 points  Repeat phrase 0 points 2 points 2 points  Total Score 0 points 2 points 2 points    Immunizations Immunization History  Administered Date(s) Administered   Fluad Quad(high Dose 65+) 12/24/2018, 12/27/2020   Fluad Trivalent(High Dose 65+) 12/16/2022   Influenza,inj,Quad PF,6+ Mos 03/03/2016, 02/02/2017, 01/11/2018   Influenza-Unspecified 01/12/2020, 12/13/2021   PFIZER(Purple Top)SARS-COV-2 Vaccination 06/07/2019, 06/28/2019, 01/25/2020   PNEUMOCOCCAL CONJUGATE-20 12/16/2022   Pneumococcal Conjugate-13 11/05/2015   Pneumococcal Polysaccharide-23 02/09/2017   Tdap 09/10/2010   Zoster, Live 04/15/1999    Screening Tests Health Maintenance  Topic Date Due   OPHTHALMOLOGY EXAM  08/01/2020   FOOT EXAM  12/04/2022   COVID-19 Vaccine (4 - 2024-25 season) 12/14/2022   INFLUENZA VACCINE  11/13/2023   Diabetic kidney evaluation - Urine ACR  12/16/2023   HEMOGLOBIN A1C  12/17/2023   Diabetic kidney evaluation - eGFR measurement  06/15/2024   Medicare Annual Wellness (AWV)  08/31/2024   Pneumonia Vaccine 29+ Years old  Completed   DEXA SCAN  Completed   HPV VACCINES  Aged Out   Meningococcal B Vaccine  Aged Out   DTaP/Tdap/Td  Discontinued   Zoster Vaccines- Shingrix  Discontinued    Health Maintenance  Health Maintenance Due  Topic Date Due   OPHTHALMOLOGY EXAM  08/01/2020   FOOT EXAM  12/04/2022   COVID-19 Vaccine (4 - 2024-25 season) 12/14/2022   Health Maintenance Items  Addressed:patient states she had a eye exam but does not know the name. And declined the rest of health maintenance  Additional Screening:  Vision Screening: Recommended annual ophthalmology exams for early detection of glaucoma and other disorders of the eye.  Dental Screening: Recommended annual dental exams for proper oral hygiene  Community Resource Referral / Chronic Care Management: CRR required this visit?  No   CCM required this visit?  No  Plan:    I have personally reviewed and noted the following in the patient's chart:   Medical and social history Use of alcohol, tobacco or illicit drugs  Current medications and supplements including opioid prescriptions. Patient is not currently taking opioid prescriptions. Functional ability and status Nutritional status Physical activity Advanced directives List of other physicians Hospitalizations, surgeries, and ER visits in previous 12 months Vitals Screenings to include cognitive, depression, and falls Referrals and appointments  In addition, I have reviewed and discussed with patient certain preventive protocols, quality metrics, and best practice recommendations. A written personalized care plan for preventive services as well as general preventive health recommendations were provided to patient.   Freeda Jerry, [place]   09/01/2023   After Visit Summary: (MyChart) Due to this being a telephonic visit, the after visit summary with patients personalized plan was offered to patient via MyChart   Notes: Nothing significant to report at this time.

## 2023-09-01 NOTE — Telephone Encounter (Signed)
Noted, orders placed. 

## 2023-09-01 NOTE — Patient Instructions (Signed)
 Catherine Tate , Thank you for taking time out of your busy schedule to complete your Annual Wellness Visit with me. I enjoyed our conversation and look forward to speaking with you again next year. I, as well as your care team,  appreciate your ongoing commitment to your health goals. Please review the following plan we discussed and let me know if I can assist you in the future. Your Game plan/ To Do List    Referrals: If you haven't heard from the office you've been referred to, please reach out to them at the phone provided.   Follow up Visits: Next Medicare AWV with our clinical staff: 09/01/2024   Have you seen your provider in the last 6 months (3 months if uncontrolled diabetes)? Yes Next Office Visit with your provider: 12/17/2023  Clinician Recommendations:  Aim for 30 minutes of exercise or brisk walking, 6-8 glasses of water, and 5 servings of fruits and vegetables each day.       This is a list of the screening recommended for you and due dates:  Health Maintenance  Topic Date Due   Eye exam for diabetics  08/01/2020   Complete foot exam   12/04/2022   COVID-19 Vaccine (4 - 2024-25 season) 12/14/2022   Flu Shot  11/13/2023   Yearly kidney health urinalysis for diabetes  12/16/2023   Hemoglobin A1C  12/17/2023   Yearly kidney function blood test for diabetes  06/15/2024   Medicare Annual Wellness Visit  08/31/2024   Pneumonia Vaccine  Completed   DEXA scan (bone density measurement)  Completed   HPV Vaccine  Aged Out   Meningitis B Vaccine  Aged Out   DTaP/Tdap/Td vaccine  Discontinued   Zoster (Shingles) Vaccine  Discontinued    Advanced directives: (In Chart) A copy of your advanced directives are scanned into your chart should your provider ever need it. Advance Care Planning is important because it:  [x]  Makes sure you receive the medical care that is consistent with your values, goals, and preferences  [x]  It provides guidance to your family and loved ones and  reduces their decisional burden about whether or not they are making the right decisions based on your wishes.  Follow the link provided in your after visit summary or read over the paperwork we have mailed to you to help you started getting your Advance Directives in place. If you need assistance in completing these, please reach out to us  so that we can help you!  See attachments for Preventive Care and Fall Prevention Tips.

## 2023-09-01 NOTE — Addendum Note (Signed)
 Addended by: Zakery Normington K on: 09/01/2023 05:56 PM   Modules accepted: Orders

## 2023-09-02 ENCOUNTER — Ambulatory Visit (INDEPENDENT_AMBULATORY_CARE_PROVIDER_SITE_OTHER): Admitting: Family Medicine

## 2023-09-02 ENCOUNTER — Encounter: Payer: Self-pay | Admitting: Family Medicine

## 2023-09-02 VITALS — BP 142/72 | HR 92 | Temp 98.6°F | Ht 66.0 in | Wt 159.6 lb

## 2023-09-02 DIAGNOSIS — N390 Urinary tract infection, site not specified: Secondary | ICD-10-CM

## 2023-09-02 DIAGNOSIS — R300 Dysuria: Secondary | ICD-10-CM | POA: Diagnosis not present

## 2023-09-02 LAB — POC URINALSYSI DIPSTICK (AUTOMATED)
Bilirubin, UA: NEGATIVE
Blood, UA: POSITIVE
Glucose, UA: NEGATIVE
Ketones, UA: NEGATIVE
Nitrite, UA: POSITIVE
Protein, UA: NEGATIVE
Spec Grav, UA: 1.015 (ref 1.010–1.025)
Urobilinogen, UA: NEGATIVE U/dL — AB
pH, UA: 6 (ref 5.0–8.0)

## 2023-09-02 LAB — POCT UA - MICROSCOPIC ONLY

## 2023-09-02 NOTE — Assessment & Plan Note (Addendum)
 Acute worsening of chronic baseline dysuria.  Patient with history of urethral stricture and recurrent UTI. Urinalysis today positive but microscopic appears contaminated with skin cells.  Will send for urine culture to verify infection and sensitivities given her history of multidrug-resistant UTI. She can use Azo as needed for symptoms, push water.  Continue Estrace  vaginal cream for prevention.  No red flags or signs of pyelonephritis on exam. Return and ER precautions provided

## 2023-09-02 NOTE — Patient Instructions (Addendum)
 Push fluid. Can use azo as needed.  Call if fever flank pain or flu like symptoms or if unable to keep down liquids.  We will call with urine culture results.

## 2023-09-02 NOTE — Progress Notes (Signed)
 Patient ID: Catherine Tate, female    DOB: 1940/11/08, 83 y.o.   MRN: [id number]  This visit was conducted in person.  BP (!) 142/72   Pulse 92   Temp 98.6 F (37 C) (Oral)   Ht 5\' 6"  (1.676 m)   Wt 159 lb 9.6 oz (72.4 kg)   SpO2 96%   BMI 25.76 kg/m    CC:  Chief Complaint  Patient presents with   Urinary Tract Infection    Ongoing for several months, She says she has always have had one. That its recurring. She was taking keflex  at one time. Burning has increased      Subjective:   HPI: Catherine Tate is a 83 y.o. female presenting on 09/02/2023 for Urinary Tract Infection (Ongoing for several months, She says she has always have had one. That its recurring. She was taking keflex  at one time. Burning has increased  )  Upon reviewing chart PCP has noted history of recurrent UTI.   Estrace  vaginal cream recommended. Last UTI treated June 16, 2023 Enterobacter multidrug-resistant with 3-day course of Cipro  Last urology office visit June 24, 2023 dr. Cherylene Corrente History of recurrent urethral stricture, dilation with catheter placement initially in April.  At that office visit UA was clear but she continued to have mild dysuria so she was treated with additional 5-day course of Cipro . Postvoid residual was 0 mL.   She now reports that in the last 2 months she has continued to have mild dysuria (had improved back to baseline after the second 5 days of cipro ) which has increased significantly in the last week.  Worse at night..  No odor, no discharge. Darker colored urine.  Drinking water a, cranberry and taking pill. No abdominal pain, no fever. No flank pain.      Relevant past medical, surgical, family and social history reviewed and updated as indicated. Interim medical history since our last visit reviewed. Allergies and medications reviewed and updated. Outpatient Medications Prior to Visit  Medication Sig Dispense Refill   acetaminophen  (TYLENOL ) 500 MG tablet Take 1,000  mg by mouth every 6 (six) hours as needed for moderate pain.      alendronate  (FOSAMAX ) 70 MG tablet TAKE ONE TABLET (70 MG TOTAL) BY MOUTH ONCE A WEEK. TAKE WITH A FULL GLASS OF WATER ON AN EMPTY STOMACH 12 tablet 3   Ascorbic Acid  (VITAMIN C ) 1000 MG tablet Take 1,000 mg by mouth daily.     atorvastatin  (LIPITOR) 10 MG tablet TAKE ONE TABLET BY MOUTH ONCE A DAY FOR CHOLSTEROL. 90 tablet 2   Calcium  Carb-Cholecalciferol (CALCIUM /VITAMIN D  PO) Take 1,200 mg by mouth.     carvedilol  (COREG ) 12.5 MG tablet Take 1 tablet (12.5 mg total) by mouth 2 (two) times daily with a meal. for blood pressure. 180 tablet 1   cetirizine  (ZYRTEC ) 10 MG tablet Take 1 tablet (10 mg total) by mouth daily as needed.     CRANBERRY PO Take 1 capsule by mouth 2 (two) times daily.     estradiol  (ESTRACE ) 0.1 MG/GM vaginal cream Apply one pea-sized amount around the opening of the urethra daily for 2 weeks, then 3 times weekly moving forward. 42.5 g 12   losartan  (COZAAR ) 100 MG tablet TAKE ONE TABLET (100 MG TOTAL) BY MOUTH DAILY. FOR BLOOD PRESSURE. 90 tablet 2   Probiotic Product (PROBIOTIC PO) Take 1 tablet by mouth daily.     No facility-administered medications prior to visit.  Per HPI unless specifically indicated in ROS section below Review of Systems  Constitutional:  Negative for fatigue and fever.  HENT:  Negative for ear pain.   Eyes:  Negative for pain.  Respiratory:  Negative for chest tightness and shortness of breath.   Cardiovascular:  Negative for chest pain, palpitations and leg swelling.  Gastrointestinal:  Negative for abdominal pain.  Genitourinary:  Positive for dysuria.   Objective:  BP (!) 142/72   Pulse 92   Temp 98.6 F (37 C) (Oral)   Ht 5\' 6"  (1.676 m)   Wt 159 lb 9.6 oz (72.4 kg)   SpO2 96%   BMI 25.76 kg/m   Wt Readings from Last 3 Encounters:  09/02/23 159 lb 9.6 oz (72.4 kg)  09/01/23 159 lb (72.1 kg)  06/24/23 158 lb (71.7 kg)      Physical Exam Constitutional:       General: She is not in acute distress.    Appearance: Normal appearance. She is well-developed. She is not ill-appearing or toxic-appearing.  HENT:     Head: Normocephalic.     Right Ear: Hearing, tympanic membrane, ear canal and external ear normal. Tympanic membrane is not erythematous, retracted or bulging.     Left Ear: Hearing, tympanic membrane, ear canal and external ear normal. Tympanic membrane is not erythematous, retracted or bulging.     Nose: No mucosal edema or rhinorrhea.     Right Sinus: No maxillary sinus tenderness or frontal sinus tenderness.     Left Sinus: No maxillary sinus tenderness or frontal sinus tenderness.     Mouth/Throat:     Pharynx: Uvula midline.  Eyes:     General: Lids are normal. Lids are everted, no foreign bodies appreciated.     Conjunctiva/sclera: Conjunctivae normal.     Pupils: Pupils are equal, round, and reactive to light.  Neck:     Thyroid : No thyroid  mass or thyromegaly.     Vascular: No carotid bruit.     Trachea: Trachea normal.  Cardiovascular:     Rate and Rhythm: Normal rate and regular rhythm.     Pulses: Normal pulses.     Heart sounds: Normal heart sounds, S1 normal and S2 normal. No murmur heard.    No friction rub. No gallop.  Pulmonary:     Effort: Pulmonary effort is normal. No tachypnea or respiratory distress.     Breath sounds: Normal breath sounds. No decreased breath sounds, wheezing, rhonchi or rales.  Abdominal:     General: Bowel sounds are normal.     Palpations: Abdomen is soft.     Tenderness: There is no abdominal tenderness.  Musculoskeletal:     Cervical back: Normal range of motion and neck supple.  Skin:    General: Skin is warm and dry.     Findings: No rash.  Neurological:     Mental Status: She is alert.  Psychiatric:        Mood and Affect: Mood is not anxious or depressed.        Speech: Speech normal.        Behavior: Behavior normal. Behavior is cooperative.        Thought Content: Thought  content normal.        Judgment: Judgment normal.       Results for orders placed or performed in visit on 09/02/23  POCT Urinalysis Dipstick (Automated)   Collection Time: 09/02/23  8:33 AM  Result Value Ref Range   Color, UA  Light yellow    Clarity, UA cloudy    Glucose, UA Negative Negative   Bilirubin, UA negative    Ketones, UA negative    Spec Grav, UA 1.015 1.010 - 1.025   Blood, UA positive    pH, UA 6.0 5.0 - 8.0   Protein, UA Negative Negative   Urobilinogen, UA negative (A) 0.2 or 1.0 E.U./dL   Nitrite, UA positive    Leukocytes, UA Large (3+) (A) Negative  POCT UA - Microscopic Only   Collection Time: 09/02/23  8:47 AM  Result Value Ref Range   WBC, Ur, HPF, POC tntc 0 - 5   RBC, Urine, Miroscopic     Bacteria, U Microscopic     Mucus, UA     Epithelial cells, urine per micros many    Crystals, Ur, HPF, POC     Casts, Ur, LPF, POC     Yeast, UA      Assessment and Plan  Dysuria Assessment & Plan: Acute worsening of chronic baseline dysuria.  Patient with history of urethral stricture and recurrent UTI. Urinalysis today positive but microscopic appears contaminated with skin cells.  Will send for urine culture to verify infection and sensitivities given her history of multidrug-resistant UTI. She can use Azo as needed for symptoms, push water.  Continue Estrace  vaginal cream for prevention.  No red flags or signs of pyelonephritis on exam. Return and ER precautions provided  Orders: -     POCT Urinalysis Dipstick (Automated) -     POCT UA - Microscopic Only -     Urine Culture  Recurrent UTI    No follow-ups on file.   Herby Lolling, MD

## 2023-09-05 LAB — URINE CULTURE
MICRO NUMBER:: 16484658
SPECIMEN QUALITY:: ADEQUATE

## 2023-09-08 ENCOUNTER — Ambulatory Visit: Payer: Self-pay | Admitting: Family Medicine

## 2023-09-08 MED ORDER — NITROFURANTOIN MONOHYD MACRO 100 MG PO CAPS
100.0000 mg | ORAL_CAPSULE | Freq: Two times a day (BID) | ORAL | 0 refills | Status: DC
Start: 2023-09-08 — End: 2023-11-18

## 2023-09-12 ENCOUNTER — Other Ambulatory Visit: Payer: Self-pay | Admitting: Primary Care

## 2023-09-12 DIAGNOSIS — I10 Essential (primary) hypertension: Secondary | ICD-10-CM

## 2023-11-14 ENCOUNTER — Other Ambulatory Visit: Payer: Self-pay

## 2023-11-14 ENCOUNTER — Emergency Department
Admission: EM | Admit: 2023-11-14 | Discharge: 2023-11-14 | Disposition: A | Discharge status: Home / Self Care | Attending: Emergency Medicine | Admitting: Emergency Medicine

## 2023-11-14 DIAGNOSIS — N183 Chronic kidney disease, stage 3 unspecified: Secondary | ICD-10-CM | POA: Insufficient documentation

## 2023-11-14 DIAGNOSIS — I129 Hypertensive chronic kidney disease with stage 1 through stage 4 chronic kidney disease, or unspecified chronic kidney disease: Secondary | ICD-10-CM | POA: Diagnosis not present

## 2023-11-14 DIAGNOSIS — E1122 Type 2 diabetes mellitus with diabetic chronic kidney disease: Secondary | ICD-10-CM | POA: Diagnosis not present

## 2023-11-14 DIAGNOSIS — I10 Essential (primary) hypertension: Secondary | ICD-10-CM | POA: Diagnosis not present

## 2023-11-14 DIAGNOSIS — Z853 Personal history of malignant neoplasm of breast: Secondary | ICD-10-CM | POA: Diagnosis not present

## 2023-11-14 DIAGNOSIS — Z85828 Personal history of other malignant neoplasm of skin: Secondary | ICD-10-CM | POA: Insufficient documentation

## 2023-11-14 DIAGNOSIS — R42 Dizziness and giddiness: Secondary | ICD-10-CM

## 2023-11-14 LAB — COMPREHENSIVE METABOLIC PANEL WITH GFR
ALT: 17 U/L (ref 0–44)
AST: 27 U/L (ref 15–41)
Albumin: 4.1 g/dL (ref 3.5–5.0)
Alkaline Phosphatase: 49 U/L (ref 38–126)
Anion gap: 10 (ref 5–15)
BUN: 17 mg/dL (ref 8–23)
CO2: 21 mmol/L — ABNORMAL LOW (ref 22–32)
Calcium: 9.1 mg/dL (ref 8.9–10.3)
Chloride: 108 mmol/L (ref 98–111)
Creatinine, Ser: 1.02 mg/dL — ABNORMAL HIGH (ref 0.44–1.00)
GFR, Estimated: 55 mL/min — ABNORMAL LOW (ref >60)
Glucose, Bld: 170 mg/dL — ABNORMAL HIGH (ref 70–99)
Potassium: 4.5 mmol/L (ref 3.5–5.1)
Sodium: 139 mmol/L (ref 135–145)
Total Bilirubin: 1.1 mg/dL (ref 0.0–1.2)
Total Protein: 7.6 g/dL (ref 6.5–8.1)

## 2023-11-14 LAB — URINALYSIS, ROUTINE W REFLEX MICROSCOPIC
Bilirubin Urine: NEGATIVE
Glucose, UA: NEGATIVE mg/dL
Hgb urine dipstick: NEGATIVE
Ketones, ur: NEGATIVE mg/dL
Leukocytes,Ua: NEGATIVE
Nitrite: NEGATIVE
Protein, ur: NEGATIVE mg/dL
Specific Gravity, Urine: 1.004 — ABNORMAL LOW (ref 1.005–1.030)
pH: 7 (ref 5.0–8.0)

## 2023-11-14 LAB — CBC
HCT: 34.7 % — ABNORMAL LOW (ref 36.0–46.0)
Hemoglobin: 12 g/dL (ref 12.0–15.0)
MCH: 32 pg (ref 26.0–34.0)
MCHC: 34.6 g/dL (ref 30.0–36.0)
MCV: 92.5 fL (ref 80.0–100.0)
Platelets: 162 K/uL (ref 150–400)
RBC: 3.75 MIL/uL — ABNORMAL LOW (ref 3.87–5.11)
RDW: 12 % (ref 11.5–15.5)
WBC: 8 K/uL (ref 4.0–10.5)
nRBC: 0 % (ref 0.0–0.2)

## 2023-11-14 MED ORDER — HYDRALAZINE HCL 20 MG/ML IJ SOLN
5.0000 mg | Freq: Once | INTRAMUSCULAR | Status: AC
  Administered 2023-11-14: 5 mg via INTRAVENOUS
  Filled 2023-11-14: qty 1

## 2023-11-14 MED ORDER — SODIUM CHLORIDE 0.9 % IV BOLUS
500.0000 mL | Freq: Once | INTRAVENOUS | Status: AC
  Administered 2023-11-14: 500 mL via INTRAVENOUS

## 2023-11-14 MED ORDER — CARVEDILOL 25 MG PO TABS: 25.0000 mg | ORAL_TABLET | Freq: Two times a day (BID) | ORAL | 0 refills | Status: DC

## 2023-11-14 NOTE — ED Provider Notes (Signed)
 [facility] Provider Note    Event Date/Time   First MD Initiated Contact with Patient 11/14/23 1646     (approximate)   History   Chief Complaint: Dizziness   HPI  Catherine Tate is a 83 y.o. female with a history of hypertension, CKD, diabetes, recurrent UTI who comes ED due to dizziness with standing since yesterday.  No falls or syncope, but does feel lightheaded when she stands up.  No motor weakness or paresthesias or vision changes.  Reports that she has had similar symptoms in the past when she had low sodium and also when she has had UTI.  Outside records reviewed noting multiple MDR UTI in the past which were treated with Cipro  or nitrofurantoin .        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   Osteoporosis    Personal history of chemotherapy 2017/2018   F/U left breast cancer   Personal history of radiation therapy 2018   F/U left breast cancer   PONV (postoperative nausea and vomiting)    RSD (reflex sympathetic dystrophy)    Seasonal allergies    Skin cancer    Type 2 diabetes mellitus with hyperglycemia [facility])    Urine incontinence     Current Outpatient Rx   Order #: [id number] Class: Historical Med   Order #: [id number] Class: Normal   Order #: [id number] Class: Historical Med   Order #: [id number] Class: Normal   Order #: [id number] Class: Historical Med   Order #: [id number] Class: Normal   Order #: [id number] Class: No Print    Order #: [id number] Class: Historical Med   Order #: [id number] Class: Normal   Order #: [id number] Class: Normal   Order #: [id number] Class: Normal   Order #: [id number] Class: Historical Med    Past Surgical History:  Procedure Laterality Date   ABDOMINAL HYSTERECTOMY  2005   total/ Dr Washington    BLADDER TUMOR EXCISION  2006   BREAST BIOPSY Left 03/13/2016   INVASIVE MAMMARY CARCINOMA   BREAST LUMPECTOMY Left 2018   BREAST LUMPECTOMY WITH SENTINEL LYMPH NODE BIOPSY Left 07/14/2016   Procedure: BREAST LUMPECTOMY WITH SENTINEL LYMPH NODE BX;  Surgeon: Reyes LELON Cota, MD;  Location: [facility];  Service: General;  Laterality: Left;   COLONOSCOPY  2023   COLONOSCOPY WITH PROPOFOL  N/A 01/02/2021   Procedure: COLONOSCOPY WITH PROPOFOL ;  Surgeon: Cota Reyes LELON, MD;  Location: [facility];  Service: Endoscopy;  Laterality: N/A;   CYSTOSCOPY WITH URETHRAL DILATATION N/A 10/28/2022   Procedure: CYSTOSCOPY WITH URETHRAL DILATATION;  Surgeon: Twylla Glendia BROCKS, MD;  Location: [facility] OR[facility];  Service: Urology;  Laterality: N/A;   denture surgery     ESOPHAGOGASTRODUODENOSCOPY (EGD) WITH PROPOFOL  N/A 07/19/2022   Procedure: ESOPHAGOGASTRODUODENOSCOPY (EGD) WITH PROPOFOL ;  Surgeon: Toledo, Ladell POUR, MD;  Location: [facility];  Service: Gastroenterology;  Laterality: N/A;   PORT-A-CATH REMOVAL     PORTACATH PLACEMENT N/A 04/01/2016   Procedure: INSERTION PORT-A-CATH;  Surgeon: Reyes LELON  Dessa, MD;  Location: [facility] OR[facility];  Service: General;  Laterality: N/A;   SKIN CANCER EXCISION Right 2015   foot/ Dr Shona   TUBAL LIGATION     TUMOR EXCISION Right    FOOT    Physical Exam   Triage Vital Signs: ED Triage Vitals  Encounter Vitals Group     BP 11/14/23 1635 (!) 187/100     Girls Systolic BP Percentile --      Girls Diastolic BP Percentile --      Boys Systolic BP Percentile --      Boys Diastolic BP Percentile --      Pulse Rate 11/14/23 1635 90     Resp 11/14/23 1635 20     Temp  11/14/23 1635 97.7 F (36.5 C)     Temp Source 11/14/23 1635 Oral     SpO2 11/14/23 1635 99 %     Weight 11/14/23 1634 159 lb (72.1 kg)     Height 11/14/23 1634 5' 6 (1.676 m)     Head Circumference --      Peak Flow --      Pain Score 11/14/23 1633 0     Pain Loc --      Pain Education --      Exclude from Growth Chart --     Most recent vital signs: Vitals:   11/14/23 1930 11/14/23 2000  BP: (!) 172/85 (!) 190/88  Pulse: 81 76  Resp:  16  Temp:  98.1 F (36.7 C)  SpO2: 98% 100%    General: Awake, no distress.  CV:  Good peripheral perfusion.  Regular rate and rhythm Resp:  Normal effort.  Clear lungs Abd:  No distention.  Soft nontender Other:  Dry oral mucosa   ED Results / Procedures / Treatments   Labs (all labs ordered are listed, but only abnormal results are displayed) Labs Reviewed  COMPREHENSIVE METABOLIC PANEL WITH GFR - Abnormal; Notable for the following components:      Result Value   CO2 21 (*)    Glucose, Bld 170 (*)    Creatinine, Ser 1.02 (*)    GFR, Estimated 55 (*)    All other components within normal limits  URINALYSIS, ROUTINE W REFLEX MICROSCOPIC - Abnormal; Notable for the following components:   Color, Urine STRAW (*)    APPearance CLEAR (*)    Specific Gravity, Urine 1.004 (*)    All other components within normal limits  CBC - Abnormal; Notable for the following components:   RBC 3.75 (*)    HCT 34.7 (*)    All other components within normal limits     EKG Interpreted by me Normal sinus rhythm rate of 90.  Normal axis intervals QRS ST segments T waves.   RADIOLOGY    PROCEDURES:  Procedures   MEDICATIONS ORDERED IN ED: Medications  hydrALAZINE  (APRESOLINE ) injection 5 mg (has no administration in time range)  sodium chloride  0.9 % bolus 500 mL (0 mLs Intravenous Stopped 11/14/23 1924)     IMPRESSION / MDM / ASSESSMENT AND PLAN / ED COURSE  I reviewed the triage vital signs and the nursing notes.  DDx: UTI,  dehydration, electrolyte derangement, AKI, anemia, symptomatic hypertension  Patient's presentation is most consistent with acute presentation with potential threat to life or bodily function.  Patient comes ED with dizziness on standing.  May be a component of pressure natriuresis with her uncontrolled hypertension.  She is nontoxic, doubt intracranial hemorrhage, aneurysm, NSTEMI, dissection.  Serum labs unremarkable.  Will give IV fluids, check urinalysis   ----------------------------------------- 8:48 PM on 11/14/2023 ----------------------------------------- Workup reassuring.  Remains asymptomatic in bed.  No headache.  Blood pressure remains elevated.  Will give low-dose of IV hydralazine  as a temporary measure, increase her Coreg  to 25 twice daily.  She will follow-up with her doctor for blood pressure recheck this week.      FINAL CLINICAL IMPRESSION(S) / ED DIAGNOSES   Final diagnoses:  Orthostatic dizziness  Uncontrolled hypertension     Rx / DC Orders   ED Discharge Orders          Ordered    carvedilol  (COREG ) 25 MG tablet  2 times daily with meals        11/14/23 2043             Note:  This document was prepared using Dragon voice recognition software and may include unintentional dictation errors.   Viviann Pastor, MD 11/14/23 2049

## 2023-11-14 NOTE — ED Notes (Signed)
 RN to bedside to introduce self to pt. Pt needs to urinate, however it was too latae. Pt pad soaked in warm urine. Pt placed on purewick and dry brief.

## 2023-11-14 NOTE — ED Notes (Signed)
 Soiled pad changed by this [employer] fundraiser. In and Out cath attempt made. Urethra is closed and met resistance. Pt states her urethra tends to close and it is difficult to do a in and out cath.

## 2023-11-14 NOTE — ED Triage Notes (Signed)
 To ED POV for dizziness since yesterday. Also states has chronic UTI and complains of dysuria. Had low sodium last year, same symptoms. Dizziness worse with getting up and down. Speech is clear.

## 2023-11-14 NOTE — ED Notes (Signed)
 Called lab to retreive purple top

## 2023-11-18 ENCOUNTER — Encounter: Payer: Self-pay | Admitting: Primary Care

## 2023-11-18 ENCOUNTER — Ambulatory Visit (INDEPENDENT_AMBULATORY_CARE_PROVIDER_SITE_OTHER): Admitting: Primary Care

## 2023-11-18 VITALS — BP 132/76 | HR 65 | Temp 97.5°F | Ht 66.0 in | Wt 161.0 lb

## 2023-11-18 DIAGNOSIS — I10 Essential (primary) hypertension: Secondary | ICD-10-CM | POA: Diagnosis not present

## 2023-11-18 NOTE — Progress Notes (Signed)
 Subjective:    Patient ID: Catherine Tate, female    DOB: 05-11-1940, 83 y.o.   MRN: [id number]  HPI  Catherine Tate is a very pleasant 83 y.o. female with a history of hypertension, type 2 diabetes, osteoporosis, recurrent UTI, CKD, breast cancer, hyponatremia who presents today for ED follow-up.  She presented to [facility] ED on 11/14/23 for dizziness without falls.  During her stay in the ED she was noted to be hypertensive.  She was treated with IV fluids and IV hydralazine .  Her carvedilol  was increased to 25 mg twice daily.  Labs are grossly unremarkable including urinalysis.  She was discharged home later that day.  Since her ED visit she is compliant to carvedilol  25 mg twice daily and losartan  100 mg daily. She has noticed some nausea without vomiting after taking the carvedilol . Her dizziness has mostly resolved, she does feel a little lightheaded.   BP Readings from Last 3 Encounters:  11/18/23 132/76  11/14/23 (!) 195/99  09/02/23 (!) 142/72      Review of Systems  Respiratory:  Negative for shortness of breath.   Cardiovascular:  Negative for chest pain.  Gastrointestinal:  Positive for nausea. Negative for vomiting.  Neurological:  Positive for light-headedness. Negative for dizziness.         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   Osteoporosis    Personal history of chemotherapy 2017/2018   F/U left breast cancer   Personal history of radiation therapy 2018    F/U left breast cancer   PONV (postoperative nausea and vomiting)    RSD (reflex sympathetic dystrophy)    Seasonal allergies    Skin cancer    Type 2 diabetes mellitus with hyperglycemia (HCC)    Urine incontinence     Social History   Socioeconomic History   Marital status: Single    Spouse name: Not on file   Number of children: 2   Years of education: Not on file   Highest education level: Not on file  Occupational History   Occupation: retired  Tobacco Use   Smoking status: Former    Current packs/day: 0.00    Average packs/day: 1 pack/day for 20.0 years (20.0 ttl pk-yrs)    Types: Cigarettes    Start date: 04/14/1974    Quit date: 04/14/1994    Years since quitting: 29.6   Smokeless tobacco: Never  Vaping Use   Vaping status: Never Used  Substance and Sexual Activity   Alcohol use: No    Alcohol/week: 0.0 standard drinks of alcohol   Drug use: No   Sexual activity: Not Currently  Other Topics Concern   Not on file  Social History Narrative   Single.   2 children, 1 grandchild.   Retired. Once worked in a Statistician.   Enjoys making jewelry, puzzle books, sewing, traveling.   Son lives with her   Social Drivers of Health  Financial Resource Strain: Low Risk  (08/18/2022)   Overall Financial Resource Strain (CARDIA)    Difficulty of Paying Living Expenses: Not hard at all  Food Insecurity: No Food Insecurity (09/01/2023)   Hunger Vital Sign    Worried About Running Out of Food in the Last Year: Never true    Ran Out of Food in the Last Year: Never true  Transportation Needs: No Transportation Needs (08/18/2022)   PRAPARE - Administrator, Civil Service (Medical): No    Lack of Transportation (Non-Medical): No  Physical Activity: Inactive (08/18/2022)   Exercise Vital Sign    Days of Exercise per Week: 0 days    Minutes of Exercise per Session: 0 min  Stress: No Stress Concern Present (08/18/2022)   [employer] of Occupational Health -  Occupational Stress Questionnaire    Feeling of Stress : Not at all  Social Connections: Moderately Isolated (08/18/2022)   Social Connection and Isolation Panel    Frequency of Communication with Friends and Family: More than three times a week    Frequency of Social Gatherings with Friends and Family: More than three times a week    Attends Religious Services: More than 4 times per year    Active Member of [employer] or Organizations: No    Attends Banker Meetings: Never    Marital Status: Never married  Intimate Partner Violence: Not At Risk (09/01/2023)   Humiliation, Afraid, Rape, and Kick questionnaire    Fear of Current or Ex-Partner: No    Emotionally Abused: No    Physically Abused: No    Sexually Abused: No    Past Surgical History:  Procedure Laterality Date   ABDOMINAL HYSTERECTOMY  2005   total/ Dr Doll    BLADDER TUMOR EXCISION  2006   BREAST BIOPSY Left 03/13/2016   INVASIVE MAMMARY CARCINOMA   BREAST LUMPECTOMY Left 2018   BREAST LUMPECTOMY WITH SENTINEL LYMPH NODE BIOPSY Left 07/14/2016   Procedure: BREAST LUMPECTOMY WITH SENTINEL LYMPH NODE BX;  Surgeon: Reyes LELON Cota, MD;  Location: [facility];  Service: General;  Laterality: Left;   COLONOSCOPY  2023   COLONOSCOPY WITH PROPOFOL  N/A 01/02/2021   Procedure: COLONOSCOPY WITH PROPOFOL ;  Surgeon: Cota Reyes LELON, MD;  Location: [facility];  Service: Endoscopy;  Laterality: N/A;   CYSTOSCOPY WITH URETHRAL DILATATION N/A 10/28/2022   Procedure: CYSTOSCOPY WITH URETHRAL DILATATION;  Surgeon: Twylla Glendia BROCKS, MD;  Location: [facility];  Service: Urology;  Laterality: N/A;   denture surgery     ESOPHAGOGASTRODUODENOSCOPY (EGD) WITH PROPOFOL  N/A 07/19/2022   Procedure: ESOPHAGOGASTRODUODENOSCOPY (EGD) WITH PROPOFOL ;  Surgeon: Toledo, Ladell POUR, MD;  Location: [facility];  Service: Gastroenterology;  Laterality: N/A;   PORT-A-CATH REMOVAL     PORTACATH PLACEMENT N/A 04/01/2016   Procedure: INSERTION  PORT-A-CATH;  Surgeon: Reyes LELON Cota, MD;  Location: [facility];  Service: General;  Laterality: N/A;   SKIN CANCER EXCISION Right 2015   foot/ Dr Shona   TUBAL LIGATION     TUMOR EXCISION Right    FOOT    Family History  Problem Relation Age of Onset   Heart disease Mother    Transient ischemic attack Mother    Emphysema Father    Breast cancer Sister    Breast cancer Cousin        paternal   Kidney cancer Neg Hx    Bladder Cancer Neg Hx     Allergies  Allergen Reactions   Bee Pollen Hives  Mosquito (Diagnostic) Hives   Other Other (See Comments)    Uncoded Allergy. Allergen: VICRYL  (90% glycolide and 10% L-lactide [polyglactin-- synthetic absorbable sterile surgical suture]), Other Reaction: Infection   Gabapentin Swelling   Mosquito (Culex Pipiens) Allergy Skin Test Hives   Norvasc  [Amlodipine  Besylate] Swelling    TO FEET   Lisinopril  Cough    Current Outpatient Medications on File Prior to Visit  Medication Sig Dispense Refill   acetaminophen  (TYLENOL ) 500 MG tablet Take 1,000 mg by mouth every 6 (six) hours as needed for moderate pain.      alendronate  (FOSAMAX ) 70 MG tablet TAKE ONE TABLET (70 MG TOTAL) BY MOUTH ONCE A WEEK. TAKE WITH A FULL GLASS OF WATER ON AN EMPTY STOMACH 12 tablet 3   Ascorbic Acid  (VITAMIN C ) 1000 MG tablet Take 1,000 mg by mouth daily.     atorvastatin  (LIPITOR) 10 MG tablet TAKE ONE TABLET BY MOUTH ONCE A DAY FOR CHOLSTEROL. 90 tablet 2   Calcium  Carb-Cholecalciferol (CALCIUM /VITAMIN D  PO) Take 1,200 mg by mouth.     carvedilol  (COREG ) 25 MG tablet Take 1 tablet (25 mg total) by mouth 2 (two) times daily with a meal. 60 tablet 0   cetirizine  (ZYRTEC ) 10 MG tablet Take 1 tablet (10 mg total) by mouth daily as needed.     CRANBERRY PO Take 1 capsule by mouth 2 (two) times daily.     estradiol  (ESTRACE ) 0.1 MG/GM vaginal cream Apply one pea-sized amount around the opening of the urethra daily for 2 weeks, then 3 times weekly moving forward.  42.5 g 12   losartan  (COZAAR ) 100 MG tablet TAKE ONE TABLET (100 MG TOTAL) BY MOUTH DAILY. FOR BLOOD PRESSURE. 90 tablet 2   Probiotic Product (PROBIOTIC PO) Take 1 tablet by mouth daily.     No current facility-administered medications on file prior to visit.    BP 132/76   Pulse 65   Temp (!) 97.5 F (36.4 C) (Temporal)   Ht 5' 6 (1.676 m)   Wt 161 lb (73 kg)   SpO2 100%   BMI 25.99 kg/m  Objective:   Physical Exam Cardiovascular:     Rate and Rhythm: Normal rate and regular rhythm.  Pulmonary:     Effort: Pulmonary effort is normal.     Breath sounds: Normal breath sounds.  Musculoskeletal:     Cervical back: Neck supple.  Skin:    General: Skin is warm and dry.  Neurological:     Mental Status: She is alert and oriented to person, place, and time.  Psychiatric:        Mood and Affect: Mood normal.           Assessment & Plan:  Essential hypertension Assessment & Plan: Improved. Reviewed ED notes and labs.  Continue carvedilol  25 mg BID, losartan  100 mg daily. Reviewed HR.  We discussed to rise slowly to change positions to avoid orthostatic hypotension.  She will watch her BP at home.         Catherine Karpel K Cassundra Mckeever, NP

## 2023-11-18 NOTE — Patient Instructions (Signed)
 Continue taking carvedilol  25 mg twice daily for blood pressure.  Continue losartan  100 mg daily for blood pressure.  It was a pleasure to see you today!

## 2023-11-18 NOTE — Assessment & Plan Note (Addendum)
 Improved. Reviewed ED notes and labs.  Continue carvedilol  25 mg BID, losartan  100 mg daily. Reviewed HR.  We discussed to rise slowly to change positions to avoid orthostatic hypotension.  She will watch her BP at home.

## 2023-11-19 ENCOUNTER — Ambulatory Visit: Admitting: Internal Medicine

## 2023-12-02 ENCOUNTER — Ambulatory Visit
Admission: RE | Admit: 2023-12-02 | Discharge: 2023-12-02 | Disposition: A | Discharge status: Home / Self Care | Source: Ambulatory Visit | Attending: Primary Care

## 2023-12-02 ENCOUNTER — Ambulatory Visit
Admission: RE | Admit: 2023-12-02 | Discharge: 2023-12-02 | Disposition: A | Discharge status: Home / Self Care | Source: Ambulatory Visit | Attending: Primary Care | Admitting: Primary Care

## 2023-12-02 DIAGNOSIS — Z1231 Encounter for screening mammogram for malignant neoplasm of breast: Secondary | ICD-10-CM | POA: Diagnosis not present

## 2023-12-02 DIAGNOSIS — Z780 Asymptomatic menopausal state: Secondary | ICD-10-CM | POA: Diagnosis not present

## 2023-12-02 DIAGNOSIS — M810 Age-related osteoporosis without current pathological fracture: Secondary | ICD-10-CM | POA: Diagnosis not present

## 2023-12-02 DIAGNOSIS — E2839 Other primary ovarian failure: Secondary | ICD-10-CM | POA: Diagnosis not present

## 2023-12-03 ENCOUNTER — Ambulatory Visit: Payer: Self-pay | Admitting: Primary Care

## 2023-12-17 ENCOUNTER — Encounter: Payer: Self-pay | Admitting: Primary Care

## 2023-12-17 ENCOUNTER — Ambulatory Visit: Payer: Self-pay | Admitting: Primary Care

## 2023-12-17 ENCOUNTER — Ambulatory Visit: Admitting: Primary Care

## 2023-12-17 VITALS — BP 132/88 | HR 82 | Temp 97.7°F | Ht 66.0 in | Wt 162.0 lb

## 2023-12-17 DIAGNOSIS — Z23 Encounter for immunization: Secondary | ICD-10-CM | POA: Diagnosis not present

## 2023-12-17 DIAGNOSIS — E1165 Type 2 diabetes mellitus with hyperglycemia: Secondary | ICD-10-CM | POA: Diagnosis not present

## 2023-12-17 LAB — MICROALBUMIN / CREATININE URINE RATIO
Creatinine,U: 73 mg/dL
Microalb Creat Ratio: 51.5 mg/g — ABNORMAL HIGH (ref 0.0–30.0)
Microalb, Ur: 3.8 mg/dL — ABNORMAL HIGH (ref 0.0–1.9)

## 2023-12-17 LAB — POCT GLYCOSYLATED HEMOGLOBIN (HGB A1C): Hemoglobin A1C: 6.3 % — AB (ref 4.0–5.6)

## 2023-12-17 NOTE — Assessment & Plan Note (Signed)
 Controlled and improved with A1C of 6.3 today!  Continue off medications.  Foot exam today. Urine microalbumin due and pending.  Follow up in 6 months.

## 2023-12-17 NOTE — Progress Notes (Signed)
 Subjective:    Patient ID: Catherine Tate, female    DOB: June 26, 1940, 83 y.o.   MRN: [id number]  Catherine Tate is a very pleasant 83 y.o. female with history of hypertension, type 2 diabetes, breast cancer, recurrent UTI, urinary incontinence who presents today for follow-up of diabetes.  Current medications include: None  She is checking her blood glucose 0 times daily.  Last A1C: 6.5 in March 2025,  Last Eye Exam: Due Last Foot Exam: Due Pneumonia Vaccination: 2024 Urine Microalbumin: Due Statin: Atorvastatin   Dietary changes since last visit: Mostly eats at home. Eats granola bars. She eats mostly veggies.    Exercise: She is walking some  BP Readings from Last 3 Encounters:  12/17/23 132/88  11/18/23 132/76  11/14/23 (!) 195/99        Review of Systems  Eyes:  Negative for visual disturbance.  Respiratory:  Negative for shortness of breath.   Cardiovascular:  Negative for chest pain.  Neurological:  Positive for numbness.         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   Osteoporosis    Personal history of chemotherapy 2017/2018   F/U left breast cancer   Personal history of radiation therapy 2018   F/U left breast cancer   PONV (postoperative nausea and vomiting)    RSD (reflex sympathetic dystrophy)    Seasonal allergies    Skin cancer    Type 2 diabetes mellitus with hyperglycemia (HCC)    Urine incontinence     Social History    Socioeconomic History   Marital status: Single    Spouse name: Not on file   Number of children: 2   Years of education: Not on file   Highest education level: Not on file  Occupational History   Occupation: retired  Tobacco Use   Smoking status: Former    Current packs/day: 0.00    Average packs/day: 1 pack/day for 20.0 years (20.0 ttl pk-yrs)    Types: Cigarettes    Start date: 04/14/1974    Quit date: 04/14/1994    Years since quitting: 29.6   Smokeless tobacco: Never  Vaping Use   Vaping status: Never Used  Substance and Sexual Activity   Alcohol use: No    Alcohol/week: 0.0 standard drinks of alcohol   Drug use: No   Sexual activity: Not Currently  Other Topics Concern   Not on file  Social History Narrative   Single.   2 children, 1 grandchild.   Retired. Once worked in a Statistician.   Enjoys making jewelry, puzzle books, sewing, traveling.   Son lives with her   Social Drivers of Health   Financial Resource Strain: Low Risk  (08/18/2022)   Overall Financial Resource Strain (CARDIA)    Difficulty of Paying Living Expenses: Not hard at all  Food Insecurity: No Food Insecurity (09/01/2023)   Hunger Vital Sign    Worried About Running  Out of Food in the Last Year: Never true    Ran Out of Food in the Last Year: Never true  Transportation Needs: No Transportation Needs (08/18/2022)   PRAPARE - Administrator, Civil Service (Medical): No    Lack of Transportation (Non-Medical): No  Physical Activity: Inactive (08/18/2022)   Exercise Vital Sign    Days of Exercise per Week: 0 days    Minutes of Exercise per Session: 0 min  Stress: No Stress Concern Present (08/18/2022)   [employer] of Occupational Health - Occupational Stress Questionnaire    Feeling of Stress : Not at all  Social Connections: Moderately Isolated (08/18/2022)   Social Connection and Isolation Panel    Frequency of Communication with Friends and Family: More than three times a week     Frequency of Social Gatherings with Friends and Family: More than three times a week    Attends Religious Services: More than 4 times per year    Active Member of [employer] or Organizations: No    Attends Banker Meetings: Never    Marital Status: Never married  Intimate Partner Violence: Not At Risk (09/01/2023)   Humiliation, Afraid, Rape, and Kick questionnaire    Fear of Current or Ex-Partner: No    Emotionally Abused: No    Physically Abused: No    Sexually Abused: No    Past Surgical History:  Procedure Laterality Date   ABDOMINAL HYSTERECTOMY  2005   total/ Dr Washington    BLADDER TUMOR EXCISION  2006   BREAST BIOPSY Left 03/13/2016   INVASIVE MAMMARY CARCINOMA   BREAST LUMPECTOMY Left 2018   BREAST LUMPECTOMY WITH SENTINEL LYMPH NODE BIOPSY Left 07/14/2016   Procedure: BREAST LUMPECTOMY WITH SENTINEL LYMPH NODE BX;  Surgeon: Reyes LELON Cota, MD;  Location: [facility];  Service: General;  Laterality: Left;   COLONOSCOPY  2023   COLONOSCOPY WITH PROPOFOL  N/A 01/02/2021   Procedure: COLONOSCOPY WITH PROPOFOL ;  Surgeon: Cota Reyes LELON, MD;  Location: [facility];  Service: Endoscopy;  Laterality: N/A;   CYSTOSCOPY WITH URETHRAL DILATATION N/A 10/28/2022   Procedure: CYSTOSCOPY WITH URETHRAL DILATATION;  Surgeon: Twylla Glendia BROCKS, MD;  Location: [facility];  Service: Urology;  Laterality: N/A;   denture surgery     ESOPHAGOGASTRODUODENOSCOPY (EGD) WITH PROPOFOL  N/A 07/19/2022   Procedure: ESOPHAGOGASTRODUODENOSCOPY (EGD) WITH PROPOFOL ;  Surgeon: Toledo, Ladell POUR, MD;  Location: [facility];  Service: Gastroenterology;  Laterality: N/A;   PORT-A-CATH REMOVAL     PORTACATH PLACEMENT N/A 04/01/2016   Procedure: INSERTION PORT-A-CATH;  Surgeon: Reyes LELON Cota, MD;  Location: [facility];  Service: General;  Laterality: N/A;   SKIN CANCER EXCISION Right 2015   foot/ Dr Shona   TUBAL LIGATION     TUMOR EXCISION Right    FOOT    Family History  Problem  Relation Age of Onset   Heart disease Mother    Transient ischemic attack Mother    Emphysema Father    Breast cancer Sister    Breast cancer Cousin        paternal   Kidney cancer Neg Hx    Bladder Cancer Neg Hx     Allergies  Allergen Reactions   Bee Pollen Hives   Mosquito (Diagnostic) Hives   Other Other (See Comments)    Uncoded Allergy. Allergen: VICRYL  (90% glycolide and 10% L-lactide [polyglactin-- synthetic absorbable sterile surgical suture]), Other Reaction: Infection   Gabapentin Swelling   Mosquito (Culex Pipiens) Allergy Skin Test  Hives   Norvasc  [Amlodipine  Besylate] Swelling    TO FEET   Lisinopril  Cough    Current Outpatient Medications on File Prior to Visit  Medication Sig Dispense Refill   acetaminophen  (TYLENOL ) 500 MG tablet Take 1,000 mg by mouth every 6 (six) hours as needed for moderate pain.      alendronate  (FOSAMAX ) 70 MG tablet TAKE ONE TABLET (70 MG TOTAL) BY MOUTH ONCE A WEEK. TAKE WITH A FULL GLASS OF WATER ON AN EMPTY STOMACH 12 tablet 3   Ascorbic Acid  (VITAMIN C ) 1000 MG tablet Take 1,000 mg by mouth daily.     atorvastatin  (LIPITOR) 10 MG tablet TAKE ONE TABLET BY MOUTH ONCE A DAY FOR CHOLSTEROL. 90 tablet 2   Calcium  Carb-Cholecalciferol (CALCIUM /VITAMIN D  PO) Take 1,200 mg by mouth.     carvedilol  (COREG ) 25 MG tablet Take 1 tablet (25 mg total) by mouth 2 (two) times daily with a meal. 60 tablet 0   cetirizine  (ZYRTEC ) 10 MG tablet Take 1 tablet (10 mg total) by mouth daily as needed.     CRANBERRY PO Take 1 capsule by mouth 2 (two) times daily.     estradiol  (ESTRACE ) 0.1 MG/GM vaginal cream Apply one pea-sized amount around the opening of the urethra daily for 2 weeks, then 3 times weekly moving forward. 42.5 g 12   losartan  (COZAAR ) 100 MG tablet TAKE ONE TABLET (100 MG TOTAL) BY MOUTH DAILY. FOR BLOOD PRESSURE. 90 tablet 2   Probiotic Product (PROBIOTIC PO) Take 1 tablet by mouth daily.     No current facility-administered medications  on file prior to visit.    BP 132/88   Pulse 82   Temp 97.7 F (36.5 C) (Temporal)   Ht 5' 6 (1.676 m)   Wt 162 lb (73.5 kg)   SpO2 98%   BMI 26.15 kg/m  Objective:   Physical Exam Cardiovascular:     Rate and Rhythm: Normal rate and regular rhythm.  Pulmonary:     Effort: Pulmonary effort is normal.     Breath sounds: Normal breath sounds.  Musculoskeletal:     Cervical back: Neck supple.  Skin:    General: Skin is warm and dry.  Neurological:     Mental Status: She is alert and oriented to person, place, and time.  Psychiatric:        Mood and Affect: Mood normal.     Physical Exam        Assessment & Plan:  Type 2 diabetes mellitus with hyperglycemia, without long-term current use of insulin (HCC) Assessment & Plan: Controlled and improved with A1C of 6.3 today!  Continue off medications.  Foot exam today. Urine microalbumin due and pending.  Follow up in 6 months.  Orders: -     POCT glycosylated hemoglobin (Hb A1C) -     Microalbumin / creatinine urine ratio    Assessment and Plan Assessment & Plan         Comer MARLA Gaskins, NP    History of Present Illness

## 2023-12-17 NOTE — Patient Instructions (Signed)
 Stop by the lab prior to leaving today. I will notify you of your results once received.   Please schedule a physical to meet with me in 6 months.   It was a pleasure to see you today!

## 2023-12-25 ENCOUNTER — Encounter: Payer: Self-pay | Admitting: Urology

## 2023-12-25 ENCOUNTER — Other Ambulatory Visit: Payer: Self-pay | Admitting: Primary Care

## 2023-12-25 ENCOUNTER — Ambulatory Visit (INDEPENDENT_AMBULATORY_CARE_PROVIDER_SITE_OTHER): Admitting: Urology

## 2023-12-25 VITALS — BP 117/71 | HR 52 | Ht 66.0 in | Wt 162.0 lb

## 2023-12-25 DIAGNOSIS — N3592 Unspecified urethral stricture, female: Secondary | ICD-10-CM

## 2023-12-25 DIAGNOSIS — I10 Essential (primary) hypertension: Secondary | ICD-10-CM

## 2023-12-25 DIAGNOSIS — N390 Urinary tract infection, site not specified: Secondary | ICD-10-CM

## 2023-12-25 LAB — URINALYSIS, COMPLETE
Bilirubin, UA: NEGATIVE
Glucose, UA: NEGATIVE
Ketones, UA: NEGATIVE
Nitrite, UA: POSITIVE — AB
Protein,UA: NEGATIVE
RBC, UA: NEGATIVE
Specific Gravity, UA: <1.005 — ABNORMAL LOW (ref 1.005–1.030)
Urobilinogen, Ur: 0.2 mg/dL (ref 0.2–1.0)
pH, UA: 6.5 (ref 5.0–7.5)

## 2023-12-25 LAB — MICROSCOPIC EXAMINATION: WBC, UA: >30 /HPF — AB (ref 0–5)

## 2023-12-25 LAB — BLADDER SCAN AMB NON-IMAGING: Scan Result: 38

## 2023-12-25 MED ORDER — CEFUROXIME AXETIL 250 MG PO TABS: 250.0000 mg | ORAL_TABLET | Freq: Two times a day (BID) | ORAL | 0 refills | Status: AC

## 2023-12-25 NOTE — Telephone Encounter (Signed)
Unable to reach patient. Unable to leave voicemail.  

## 2023-12-25 NOTE — Progress Notes (Signed)
 12/25/2023 8:49 AM   Catherine Tate 1941-02-14 [id number]  Referring provider: Gretta Comer POUR, NP [address] [place],  [place] [address]  Chief Complaint  Patient presents with   Other   Urologic history:  Recurrent urethral stricture Dilation with catheter placement initially 07/18/22 and OR 10/28/22.  Declined CIC   HPI: Catherine Tate is a 83 y.o. female presents for 83-month follow-up.  Continues to void with a good stream Has had dysuria for the past few weeks No gross hematuria No flank, abdominal or pelvic pain   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   Osteoporosis    Personal history of chemotherapy 2017/2018   F/U left breast cancer   Personal history of radiation therapy 2018   F/U left breast cancer   PONV (postoperative nausea and vomiting)    RSD (reflex sympathetic dystrophy)    Seasonal allergies    Skin cancer    Type 2 diabetes mellitus with hyperglycemia (HCC)    Urine incontinence     Surgical History: Past Surgical History:  Procedure Laterality Date   ABDOMINAL HYSTERECTOMY  2005   total/ Dr Washington    BLADDER TUMOR EXCISION  2006   BREAST BIOPSY Left 03/13/2016   INVASIVE MAMMARY CARCINOMA   BREAST LUMPECTOMY Left 2018   BREAST LUMPECTOMY WITH SENTINEL LYMPH NODE BIOPSY Left 07/14/2016   Procedure: BREAST LUMPECTOMY WITH SENTINEL LYMPH NODE BX;  Surgeon: Reyes LELON Cota, MD;  Location: [facility];  Service: General;   Laterality: Left;   COLONOSCOPY  2023   COLONOSCOPY WITH PROPOFOL  N/A 01/02/2021   Procedure: COLONOSCOPY WITH PROPOFOL ;  Surgeon: Cota Reyes LELON, MD;  Location: [facility];  Service: Endoscopy;  Laterality: N/A;   CYSTOSCOPY WITH URETHRAL DILATATION N/A 10/28/2022   Procedure: CYSTOSCOPY WITH URETHRAL DILATATION;  Surgeon: Twylla Glendia BROCKS, MD;  Location: [facility];  Service: Urology;  Laterality: N/A;   denture surgery     ESOPHAGOGASTRODUODENOSCOPY (EGD) WITH PROPOFOL  N/A 07/19/2022   Procedure: ESOPHAGOGASTRODUODENOSCOPY (EGD) WITH PROPOFOL ;  Surgeon: Toledo, Ladell POUR, MD;  Location: [facility];  Service: Gastroenterology;  Laterality: N/A;   PORT-A-CATH REMOVAL     PORTACATH PLACEMENT N/A 04/01/2016   Procedure: INSERTION PORT-A-CATH;  Surgeon: Reyes LELON Cota, MD;  Location: [facility];  Service: General;  Laterality: N/A;   SKIN CANCER EXCISION Right 2015   foot/ Dr Shona   TUBAL LIGATION     TUMOR EXCISION Right    FOOT    Home Medications:  Allergies as of 12/25/2023       Reactions   Bee Pollen Hives   Mosquito (diagnostic) Hives   Other Other (See Comments)   Uncoded Allergy. Allergen: VICRYL  (90% glycolide and 10% L-lactide [polyglactin-- synthetic absorbable sterile surgical suture]), Other Reaction: Infection   Gabapentin Swelling   Mosquito (culex Pipiens) Allergy Skin Test Hives   Norvasc  [amlodipine  Besylate] Swelling   TO FEET  Lisinopril  Cough        Medication List        Accurate as of December 25, 2023  8:49 AM. If you have any questions, ask your nurse or doctor.          acetaminophen  500 MG tablet Commonly known as: TYLENOL  Take 1,000 mg by mouth every 6 (six) hours as needed for moderate pain.   alendronate  70 MG tablet Commonly known as: FOSAMAX  TAKE ONE TABLET (70 MG TOTAL) BY MOUTH ONCE A WEEK. TAKE WITH A FULL GLASS OF WATER ON AN EMPTY STOMACH   atorvastatin  10 MG tablet Commonly known as: LIPITOR TAKE ONE TABLET BY  MOUTH ONCE A DAY FOR CHOLSTEROL.   CALCIUM /VITAMIN D  PO Take 1,200 mg by mouth.   carvedilol  25 MG tablet Commonly known as: COREG  Take 1 tablet (25 mg total) by mouth 2 (two) times daily with a meal.   cefUROXime  250 MG tablet Commonly known as: CEFTIN  Take 1 tablet (250 mg total) by mouth 2 (two) times daily with a meal for 7 days. Started by: Glendia JAYSON Barba   cetirizine  10 MG tablet Commonly known as: ZYRTEC  Take 1 tablet (10 mg total) by mouth daily as needed.   CRANBERRY PO Take 1 capsule by mouth 2 (two) times daily.   estradiol  0.1 MG/GM vaginal cream Commonly known as: ESTRACE  Apply one pea-sized amount around the opening of the urethra daily for 2 weeks, then 3 times weekly moving forward.   losartan  100 MG tablet Commonly known as: COZAAR  TAKE ONE TABLET (100 MG TOTAL) BY MOUTH DAILY. FOR BLOOD PRESSURE.   PROBIOTIC PO Take 1 tablet by mouth daily.   vitamin C  1000 MG tablet Take 1,000 mg by mouth daily.        Allergies:  Allergies  Allergen Reactions   Bee Pollen Hives   Mosquito (Diagnostic) Hives   Other Other (See Comments)    Uncoded Allergy. Allergen: VICRYL  (90% glycolide and 10% L-lactide [polyglactin-- synthetic absorbable sterile surgical suture]), Other Reaction: Infection   Gabapentin Swelling   Mosquito (Culex Pipiens) Allergy Skin Test Hives   Norvasc  [Amlodipine  Besylate] Swelling    TO FEET   Lisinopril  Cough    Family History: Family History  Problem Relation Age of Onset   Heart disease Mother    Transient ischemic attack Mother    Emphysema Father    Breast cancer Sister    Breast cancer Cousin        paternal   Kidney cancer Neg Hx    Bladder Cancer Neg Hx     Social History:  reports that she quit smoking about 29 years ago. Her smoking use included cigarettes. She started smoking about 49 years ago. She has a 20 pack-year smoking history. She has never used smokeless tobacco. She reports that she does not drink  alcohol and does not use drugs.   Physical Exam: BP 117/71   Pulse (!) 52   Ht 5' 6 (1.676 m)   Wt 162 lb (73.5 kg)   BMI 26.15 kg/m   Constitutional:  Alert, No acute distress. HEENT: [place] AT Respiratory: Normal respiratory effort, no increased work of breathing. Psychiatric: Normal mood and affect.  Laboratory Data:  Urinalysis Dipstick nitrite +/2+ leukocytes Microscopy >30 WBC/many bacteria   Assessment & Plan:    1.  History urethral stricture No obstructive voiding symptoms PVR today 38 mL  2. Recurrent UTI  Presently symptomatic Urine culture ordered Rx cefuroxime  250 mg twice  daily sent pending C&S   Return in about 1 year (around 12/24/2024) for Recheck, Bladder scan.   Glendia JAYSON Barba, MD  [facility] [address] [place], [place] [address] [phone]

## 2023-12-25 NOTE — Telephone Encounter (Signed)
 Please call pharmacy:  I sent a 9 month supply of carvedilol  in June 2025. She should have refills on file.

## 2023-12-28 NOTE — Telephone Encounter (Signed)
 Please call patient:  Has she been taking 1/2 pill of carvedilol  medication for BP or a whole pill?

## 2023-12-28 NOTE — Telephone Encounter (Signed)
 Spoke with pharmacy, rx that sent in June was for carvedilol  12.5mg  dose.  Request is for increased dose of 25mg . Pharmacy stated they do have a prescription ready of the carvedilol  12.5mg  for her, but per last OV note patient was to continue 25mg  BID. Pharmacy stated if new rx for 25mg  BID was sent in they will disgard the 12.5mg  dose.

## 2023-12-28 NOTE — Telephone Encounter (Signed)
Unable to reach patient. Unable to leave voicemail.  

## 2023-12-29 NOTE — Telephone Encounter (Signed)
 Attempted to contact pt. No answer, no vm. Need to get answer to [name]'s question.

## 2023-12-30 LAB — CULTURE, URINE COMPREHENSIVE

## 2023-12-30 NOTE — Telephone Encounter (Signed)
 Spoke with patient, she has been taking one 25mg  tab BID.

## 2023-12-31 NOTE — Telephone Encounter (Signed)
 Patient returning call from the office. Would like clarification on the prescribed heart rate monitor. Please advise.

## 2023-12-31 NOTE — Telephone Encounter (Signed)
 Her heart rate in the office during her urologist appointment was 68 which is too low.  The carvedilol  blood pressure pill can cause this.  Does she monitor her heart rate at home?  If not, then I want her to start monitoring it and call us  back with readings in 1 week.  Does she feel dizzy or lightheaded, especially when walking or exerting herself?

## 2023-12-31 NOTE — Telephone Encounter (Signed)
 Called and spoke with patient, she does not monitor her heart rate at home. She will get a pulse ox and begin doing this and will call back in 1 week with readings. Patient denies having any dizziness or lightheadedness.

## 2024-01-01 ENCOUNTER — Ambulatory Visit: Payer: Self-pay | Admitting: Urology

## 2024-01-01 NOTE — Telephone Encounter (Signed)
 Please call patient:  Her HR was too low when she saw her urologist last week. She needs to monitor her HR. Her HR could be low because the emergency department doctor increased her BP medication, carvedilol , to a full pill twice daily.  Have her call back next Wednesday with readings.

## 2024-01-01 NOTE — Telephone Encounter (Signed)
Unable to reach patient. Unable to leave voicemail.  

## 2024-01-04 NOTE — Telephone Encounter (Signed)
 Noted

## 2024-01-04 NOTE — Telephone Encounter (Signed)
 Called and spoke with patient, reviewed Meredith message. Patient states she got pulse ox on Friday and started monitoring over the weekend. So far her HR has been Fri 74, Sat 88, Sun 77. She will continue to keep monitoring.

## 2024-01-20 NOTE — Progress Notes (Signed)
 Catherine Tate                                          MRN: [id number]   01/20/2024   The [facility] Quality Team Specialist reviewed this patient medical record for the purposes of chart review for care gap closure. The following were reviewed: abstraction for care gap closure-kidney health evaluation for diabetes:eGFR  and uACR.    [facility] Quality Team

## 2024-02-19 ENCOUNTER — Emergency Department

## 2024-02-19 ENCOUNTER — Emergency Department
Admission: EM | Admit: 2024-02-19 | Discharge: 2024-02-20 | Disposition: A | Discharge status: Home / Self Care | Attending: Emergency Medicine | Admitting: Emergency Medicine

## 2024-02-19 ENCOUNTER — Other Ambulatory Visit: Payer: Self-pay

## 2024-02-19 DIAGNOSIS — R7989 Other specified abnormal findings of blood chemistry: Secondary | ICD-10-CM | POA: Insufficient documentation

## 2024-02-19 DIAGNOSIS — Z79899 Other long term (current) drug therapy: Secondary | ICD-10-CM | POA: Insufficient documentation

## 2024-02-19 DIAGNOSIS — I129 Hypertensive chronic kidney disease with stage 1 through stage 4 chronic kidney disease, or unspecified chronic kidney disease: Secondary | ICD-10-CM | POA: Diagnosis not present

## 2024-02-19 DIAGNOSIS — Z85828 Personal history of other malignant neoplasm of skin: Secondary | ICD-10-CM | POA: Insufficient documentation

## 2024-02-19 DIAGNOSIS — N183 Chronic kidney disease, stage 3 unspecified: Secondary | ICD-10-CM | POA: Insufficient documentation

## 2024-02-19 DIAGNOSIS — R42 Dizziness and giddiness: Secondary | ICD-10-CM | POA: Diagnosis not present

## 2024-02-19 DIAGNOSIS — I10 Essential (primary) hypertension: Secondary | ICD-10-CM | POA: Diagnosis not present

## 2024-02-19 DIAGNOSIS — Z853 Personal history of malignant neoplasm of breast: Secondary | ICD-10-CM | POA: Diagnosis not present

## 2024-02-19 DIAGNOSIS — E1122 Type 2 diabetes mellitus with diabetic chronic kidney disease: Secondary | ICD-10-CM | POA: Diagnosis not present

## 2024-02-19 DIAGNOSIS — I6523 Occlusion and stenosis of bilateral carotid arteries: Secondary | ICD-10-CM | POA: Diagnosis not present

## 2024-02-19 LAB — CBC
HCT: 35.4 % — ABNORMAL LOW (ref 36.0–46.0)
Hemoglobin: 11.6 g/dL — ABNORMAL LOW (ref 12.0–15.0)
MCH: 30.9 pg (ref 26.0–34.0)
MCHC: 32.8 g/dL (ref 30.0–36.0)
MCV: 94.4 fL (ref 80.0–100.0)
Platelets: 185 K/uL (ref 150–400)
RBC: 3.75 MIL/uL — ABNORMAL LOW (ref 3.87–5.11)
RDW: 11.7 % (ref 11.5–15.5)
WBC: 5.3 K/uL (ref 4.0–10.5)
nRBC: 0 % (ref 0.0–0.2)

## 2024-02-19 LAB — BASIC METABOLIC PANEL WITH GFR
Anion gap: 11 (ref 5–15)
BUN: 19 mg/dL (ref 8–23)
CO2: 23 mmol/L (ref 22–32)
Calcium: 8.7 mg/dL — ABNORMAL LOW (ref 8.9–10.3)
Chloride: 101 mmol/L (ref 98–111)
Creatinine, Ser: 1.09 mg/dL — ABNORMAL HIGH (ref 0.44–1.00)
GFR, Estimated: 51 mL/min — ABNORMAL LOW (ref >60)
Glucose, Bld: 151 mg/dL — ABNORMAL HIGH (ref 70–99)
Potassium: 4.2 mmol/L (ref 3.5–5.1)
Sodium: 135 mmol/L (ref 135–145)

## 2024-02-19 LAB — URINALYSIS, ROUTINE W REFLEX MICROSCOPIC
Bilirubin Urine: NEGATIVE
Glucose, UA: NEGATIVE mg/dL
Hgb urine dipstick: NEGATIVE
Ketones, ur: NEGATIVE mg/dL
Leukocytes,Ua: NEGATIVE
Nitrite: NEGATIVE
Protein, ur: NEGATIVE mg/dL
Specific Gravity, Urine: 1.008 (ref 1.005–1.030)
pH: 6 (ref 5.0–8.0)

## 2024-02-19 MED ORDER — CARVEDILOL 25 MG PO TABS
25.0000 mg | ORAL_TABLET | Freq: Once | ORAL | Status: AC
  Administered 2024-02-20: 25 mg via ORAL
  Filled 2024-02-19: qty 1

## 2024-02-19 NOTE — ED Triage Notes (Signed)
 Pt to ED for dizziness started today, generalized weakness. +urinary sx.

## 2024-02-19 NOTE — ED Provider Notes (Signed)
 [facility] Provider Note    Event Date/Time   First MD Initiated Contact with Patient 02/19/24 2301     (approximate)   History   Dizziness   HPI  Catherine Tate is a 83 y.o. female with history of chronic kidney disease, hypertension, hyperlipidemia, type 2 diabetes, breast cancer who presents emergency department complaints of feeling dizzy.  She describes it as a lightheadedness worse with standing.  No vertigo.  Denies headache, numbness, tingling or weakness.  No falls.  No chest pain or shortness of breath.  She is concerned she could have a UTI or her sodium could be low.  States she does have some dysuria.  Denies vomiting or diarrhea.  Reports she has been eating and drinking well.  No vomiting or diarrhea.  She is concerned because her blood pressure is currently elevated and she has not had her Coreg  tonight.   History provided by patient, son.    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   Osteoporosis    Personal history of chemotherapy 2017/2018   F/U left breast cancer   Personal history of radiation therapy 2018   F/U left breast cancer   PONV (postoperative nausea and vomiting)    RSD (reflex sympathetic dystrophy)    Seasonal allergies    Skin cancer    Type 2 diabetes mellitus with hyperglycemia (HCC)    Urine incontinence     Past Surgical History:  Procedure Laterality Date   ABDOMINAL HYSTERECTOMY  2005   total/ Dr  [place]    BLADDER TUMOR EXCISION  2006   BREAST BIOPSY Left 03/13/2016   INVASIVE MAMMARY CARCINOMA   BREAST LUMPECTOMY Left 2018   BREAST LUMPECTOMY WITH SENTINEL LYMPH NODE BIOPSY Left 07/14/2016   Procedure: BREAST LUMPECTOMY WITH SENTINEL LYMPH NODE BX;  Surgeon: Reyes LELON Cota, MD;  Location: [facility] ORS;  Service: General;  Laterality: Left;   COLONOSCOPY  2023   COLONOSCOPY WITH PROPOFOL  N/A 01/02/2021   Procedure: COLONOSCOPY WITH PROPOFOL ;  Surgeon: Cota Reyes LELON, MD;  Location: [facility];  Service: Endoscopy;  Laterality: N/A;   CYSTOSCOPY WITH URETHRAL DILATATION N/A 10/28/2022   Procedure: CYSTOSCOPY WITH URETHRAL DILATATION;  Surgeon: Twylla Glendia BROCKS, MD;  Location: [facility] OR[facility];  Service: Urology;  Laterality: N/A;   denture surgery     ESOPHAGOGASTRODUODENOSCOPY (EGD) WITH PROPOFOL  N/A 07/19/2022   Procedure: ESOPHAGOGASTRODUODENOSCOPY (EGD) WITH PROPOFOL ;  Surgeon: Toledo, Ladell POUR, MD;  Location: [facility];  Service: Gastroenterology;  Laterality: N/A;   PORT-A-CATH REMOVAL     PORTACATH PLACEMENT N/A 04/01/2016   Procedure: INSERTION PORT-A-CATH;  Surgeon: Reyes LELON Cota, MD;  Location: [facility] OR[facility];  Service: General;  Laterality: N/A;   SKIN CANCER EXCISION Right 2015   foot/ Dr Shona   TUBAL LIGATION     TUMOR EXCISION Right    FOOT    MEDICATIONS:  Prior to Admission medications   Medication Sig Start  Date End Date Taking? Authorizing Provider  acetaminophen  (TYLENOL ) 500 MG tablet Take 1,000 mg by mouth every 6 (six) hours as needed for moderate pain.     [provider]  alendronate  (FOSAMAX ) 70 MG tablet TAKE ONE TABLET (70 MG TOTAL) BY MOUTH ONCE A WEEK. TAKE WITH A FULL GLASS OF WATER ON AN EMPTY STOMACH 07/14/23   Gretta Comer POUR, NP  Ascorbic Acid  (VITAMIN C ) 1000 MG tablet Take 1,000 mg by mouth daily.    [provider]  atorvastatin  (LIPITOR) 10 MG tablet TAKE ONE TABLET BY MOUTH ONCE A DAY FOR CHOLSTEROL. 07/06/23   Clark,  Katherine K, NP  Calcium  Carb-Cholecalciferol (CALCIUM /VITAMIN D  PO) Take 1,200 mg by mouth.    [provider]  carvedilol  (COREG ) 25 MG tablet Take 1 tablet (25 mg total) by mouth 2 (two) times daily with a meal. for blood pressure. 01/01/24   Clark, Katherine K, NP  cetirizine  (ZYRTEC ) 10 MG tablet Take 1 tablet (10 mg total) by mouth daily as needed. 01/26/23   Cleatus Arlyss RAMAN, MD  CRANBERRY PO Take 1 capsule by mouth 2 (two) times daily.    [provider]  estradiol  (ESTRACE ) 0.1 MG/GM vaginal cream Apply one pea-sized amount around the opening of the urethra daily for 2 weeks, then 3 times weekly moving forward. 12/24/22   Vaillancourt, Samantha, PA-C  losartan  (COZAAR ) 100 MG tablet TAKE ONE TABLET (100 MG TOTAL) BY MOUTH DAILY. FOR BLOOD PRESSURE. 07/06/23   Clark, Katherine K, NP  Probiotic Product (PROBIOTIC PO) Take 1 tablet by mouth daily.    [provider]    Physical Exam   Triage Vital Signs: ED Triage Vitals  Encounter Vitals Group     BP 02/19/24 1729 (!) 185/90     Girls Systolic BP Percentile --      Girls Diastolic BP Percentile --      Boys Systolic BP Percentile --      Boys Diastolic BP Percentile --      Pulse Rate 02/19/24 1729 81     Resp 02/19/24 1729 16     Temp 02/19/24 1729 97.8 F (36.6 C)     Temp Source 02/19/24 2300 Oral     SpO2 02/19/24 1729 98 %     Weight 02/19/24 1730 162 lb (73.5 kg)     Height 02/19/24 1730 5' 6 (1.676 m)     Head Circumference --      Peak Flow --      Pain Score 02/19/24 1730 0     Pain Loc --      Pain Education --      Exclude from Growth Chart --     Most recent vital signs: Vitals:   02/19/24 2300 02/20/24 0001  BP: (!) 181/78 (!) 176/77  Pulse: 80 81  Resp: 14   Temp: 97.8 F (36.6 C)   SpO2: 99%     CONSTITUTIONAL: Alert, responds appropriately to questions.  Elderly HEAD: Normocephalic, atraumatic EYES: Conjunctivae clear, pupils appear equal, sclera nonicteric ENT: normal  nose; moist mucous membranes NECK: Supple, normal ROM CARD: RRR; S1 and S2 appreciated RESP: Normal chest excursion without splinting or tachypnea; breath sounds clear and equal bilaterally; no wheezes, no rhonchi, no rales, no hypoxia or respiratory distress, speaking full sentences ABD/GI: Non-distended; soft, non-tender, no rebound, no guarding, no peritoneal signs BACK: The back appears normal EXT: Normal ROM in all joints; no deformity noted, no edema, no calf tenderness or calf  swelling SKIN: Normal color for age and race; warm; no rash on exposed skin NEURO: Moves all extremities equally, normal speech, normal sensation diffusely, no facial asymmetry PSYCH: The patient's mood and manner are appropriate.   ED Results / Procedures / Treatments   LABS: (all labs ordered are listed, but only abnormal results are displayed) Labs Reviewed  CBC - Abnormal; Notable for the following components:      Result Value   RBC 3.75 (*)    Hemoglobin 11.6 (*)    HCT 35.4 (*)    All other components within normal limits  BASIC METABOLIC PANEL WITH GFR - Abnormal; Notable for the following components:   Glucose, Bld 151 (*)    Creatinine, Ser 1.09 (*)    Calcium  8.7 (*)    GFR, Estimated 51 (*)    All other components within normal limits  URINALYSIS, ROUTINE W REFLEX MICROSCOPIC - Abnormal; Notable for the following components:   Color, Urine STRAW (*)    APPearance CLEAR (*)    All other components within normal limits  URINE CULTURE  MAGNESIUM   TSH  TROPONIN I (HIGH SENSITIVITY)     EKG:  EKG Interpretation Date/Time:  Friday February 19 2024 17:39:26 EST Ventricular Rate:  84 PR Interval:  192 QRS Duration:  78 QT Interval:  356 QTC Calculation: 420 R Axis:   41  Text Interpretation: Normal sinus rhythm Normal ECG When compared with ECG of 14-Nov-2023 16:37, No significant change was found Confirmed by Neomi Neptune [phone]) on 02/19/2024 11:22:16 PM          RADIOLOGY: My personal review and interpretation of imaging: CT head shows no acute abnormality.  I have personally reviewed all radiology reports.   CT Head Wo Contrast Result Date: 02/19/2024 EXAM: CT HEAD WITHOUT CONTRAST 02/19/2024 08:15:28 PM TECHNIQUE: CT of the head was performed without the administration of intravenous contrast. Automated exposure control, iterative reconstruction, and/or weight based adjustment of the mA/kV was utilized to reduce the radiation dose to as low as reasonably achievable. COMPARISON: None available. CLINICAL HISTORY: Vertigo, central. FINDINGS: BRAIN AND VENTRICLES: No acute hemorrhage. Chronic right thalamic infarct. Atherosclerotic calcifications within the cavernous internal carotid arteries. No hydrocephalus. No extra-axial collection. No mass effect or midline shift. ORBITS: No acute abnormality. SINUSES: No acute abnormality. SOFT TISSUES AND SKULL: No acute soft tissue abnormality. No skull fracture. IMPRESSION: 1. No acute intracranial abnormality. 2. Chronic right thalamic infarct. Electronically signed by: Norman Gatlin MD 02/19/2024 08:27 PM EST RP Workstation: [id number]     PROCEDURES:  Critical Care performed: No    .1-3 Lead EKG Interpretation  Performed by: Ozella Comins, Neptune SAILOR, DO Authorized by: Vernard Gram, Neptune SAILOR, DO     Interpretation: normal     ECG rate:  80   ECG rate assessment: normal     Rhythm: sinus rhythm     Ectopy: none     Conduction: normal       IMPRESSION / MDM / ASSESSMENT AND PLAN / ED COURSE  I reviewed the triage vital signs and the nursing notes.    Patient here with lightheadedness worse with standing.  She is worried that her sodium could be low or she could have a UTI.  The patient is on the cardiac monitor to evaluate for evidence of arrhythmia and/or significant heart rate changes.   DIFFERENTIAL DIAGNOSIS (includes but not limited to):   Dehydration, electrolyte derangement, anemia, UTI,  thyroid  dysfunction, orthostasis, less likely ACS, arrhythmia, thyroid  dysfunction, doubt stroke  Patient's presentation is most consistent with acute presentation with potential threat to life or bodily function.   PLAN: Patient's labs show mild anemia which is stable compared to previous.  Also minimally elevated creatinine which is also stable.  Normal electrolytes and glucose.  Urine does not appear infected but given she is complaining of dysuria I will send a urine culture.  CT of the head obtained from triage and reviewed and interpreted by myself and the radiologist and is unremarkable.  She has no focal neurodeficits and denies vertigo.  I do not think that she is having a stroke.  Will check orthostatic vital signs, encourage oral fluids.  She is also requesting her home dose of Coreg  for her hypertension which is due now.   MEDICATIONS GIVEN IN ED: Medications  carvedilol  (COREG ) tablet 25 mg (25 mg Oral Given 02/20/24 0001)     ED COURSE: Patient's blood sugar did drop some with standing but still hypertensive.  She has had orthostasis previously which is likely causing symptoms today.  She is feeling better, ambulatory here, tolerating p.o.  Magnesium , TSH and troponin unremarkable.  I feel she is safe for discharge home.   At this time, I do not feel there is any life-threatening condition present. I reviewed all nursing notes, vitals, pertinent previous records.  All lab and urine results, EKGs, imaging ordered have been independently reviewed and interpreted by myself.  I reviewed all available radiology reports from any imaging ordered this visit.  Based on my assessment, I feel the patient is safe to be discharged home without further emergent workup and can continue workup as an outpatient as needed. Discussed all findings, treatment plan as well as usual and customary return precautions.  They verbalize understanding and are comfortable with this plan.  Outpatient follow-up has  been provided as needed.  All questions have been answered.    CONSULTS:  none   OUTSIDE RECORDS REVIEWED: Reviewed recent urology and family medicine notes.  Patient was seen in the emergency department on 11/14/2023 for orthostatic dizziness and uncontrolled hypertension.       FINAL CLINICAL IMPRESSION(S) / ED DIAGNOSES   Final diagnoses:  Lightheadedness     Rx / DC Orders   ED Discharge Orders     None        Note:  This document was prepared using Dragon voice recognition software and may include unintentional dictation errors.   Ezekiel Menzer, Josette SAILOR, DO 02/20/24 [phone]

## 2024-02-20 LAB — TSH: TSH: 3.359 u[IU]/mL (ref 0.350–4.500)

## 2024-02-20 LAB — TROPONIN I (HIGH SENSITIVITY): Troponin I (High Sensitivity): 5 ng/L (ref <18)

## 2024-02-20 LAB — MAGNESIUM: Magnesium: 2.1 mg/dL (ref 1.7–2.4)

## 2024-02-20 NOTE — ED Notes (Signed)
 Pt discharged to home.  AVS reviewed and pt verbalized understanding.  Pt has no questions at this time.

## 2024-02-20 NOTE — ED Notes (Signed)
 Pt ambulated in hallway, maintained O2 saturation of 94% or better throughout.  No reported dizziness or lightheadedness.

## 2024-02-22 LAB — URINE CULTURE: Culture: >=100000 — AB

## 2024-02-24 NOTE — Progress Notes (Signed)
 ED Antimicrobial Stewardship Positive Culture Follow Up   BRENTLEY HORRELL is an 83 y.o. female who presented to [facility] on 02/19/2024 with a chief complaint of  Chief Complaint  Patient presents with   Dizziness    Recent Results (from the past 720 hours)  Urine Culture     Status: Abnormal   Collection Time: 02/19/24 11:14 PM   Specimen: Urine, Clean Catch  Result Value Ref Range Status   Specimen Description   Final    URINE, CLEAN CATCH Performed at [facility], [address] [place], [place] [address]    Special Requests   Final    NONE Performed at [facility], [address], [place], [place] [address]    Culture >=100,000 COLONIES/mL ESCHERICHIA COLI (A)  Final   Report Status 02/22/2024 FINAL  Final   Organism ID, Bacteria ESCHERICHIA COLI (A)  Final      Susceptibility   Escherichia coli - MIC*    AMPICILLIN <=2 SENSITIVE Sensitive     CEFAZOLIN  (URINE) Value in next row Sensitive      2 SENSITIVEThis is a modified FDA-approved test that has been validated and its performance characteristics determined by the reporting laboratory.  This laboratory is certified under the Clinical Laboratory Improvement Amendments CLIA as qualified to perform high complexity clinical laboratory testing.    CEFEPIME Value in next row Sensitive      2 SENSITIVEThis is a modified FDA-approved test that has been validated and its performance characteristics determined by the reporting laboratory.  This laboratory is certified under the Clinical Laboratory Improvement Amendments CLIA as qualified to perform high complexity clinical laboratory testing.    ERTAPENEM Value in next row Sensitive      2 SENSITIVEThis is a modified FDA-approved test that has been validated and its performance characteristics determined by the reporting laboratory.  This laboratory is certified under the Clinical Laboratory Improvement Amendments CLIA as qualified to perform high complexity  clinical laboratory testing.    CEFTRIAXONE Value in next row Sensitive      2 SENSITIVEThis is a modified FDA-approved test that has been validated and its performance characteristics determined by the reporting laboratory.  This laboratory is certified under the Clinical Laboratory Improvement Amendments CLIA as qualified to perform high complexity clinical laboratory testing.    CIPROFLOXACIN  Value in next row Sensitive      2 SENSITIVEThis is a modified FDA-approved test that has been validated and its performance characteristics determined by the reporting laboratory.  This laboratory is certified under the Clinical Laboratory Improvement Amendments CLIA as qualified to perform high complexity clinical laboratory testing.    GENTAMICIN Value in next row Sensitive      2 SENSITIVEThis is a modified FDA-approved test that has been validated and its performance characteristics determined by the reporting laboratory.  This laboratory is certified under the Clinical Laboratory Improvement Amendments CLIA as qualified to perform high complexity clinical laboratory testing.    NITROFURANTOIN  Value in next row Sensitive      2 SENSITIVEThis is a modified FDA-approved test that has been validated and its performance characteristics determined by the reporting laboratory.  This laboratory is certified under the Clinical Laboratory Improvement Amendments CLIA as qualified to perform high complexity clinical laboratory testing.    TRIMETH /SULFA  Value in next row Sensitive      2 SENSITIVEThis is a modified FDA-approved test that has been validated and its performance characteristics determined by the reporting laboratory.  This laboratory is  certified under the Clinical Laboratory Improvement Amendments CLIA as qualified to perform high complexity clinical laboratory testing.    AMPICILLIN/SULBACTAM Value in next row Sensitive      2 SENSITIVEThis is a modified FDA-approved test that has been validated and its  performance characteristics determined by the reporting laboratory.  This laboratory is certified under the Clinical Laboratory Improvement Amendments CLIA as qualified to perform high complexity clinical laboratory testing.    PIP/TAZO Value in next row Sensitive      <=4 SENSITIVEThis is a modified FDA-approved test that has been validated and its performance characteristics determined by the reporting laboratory.  This laboratory is certified under the Clinical Laboratory Improvement Amendments CLIA as qualified to perform high complexity clinical laboratory testing.    MEROPENEM Value in next row Sensitive      <=4 SENSITIVEThis is a modified FDA-approved test that has been validated and its performance characteristics determined by the reporting laboratory.  This laboratory is certified under the Clinical Laboratory Improvement Amendments CLIA as qualified to perform high complexity clinical laboratory testing.    * >=100,000 COLONIES/mL ESCHERICHIA COLI    []  Treated with , organism resistant to prescribed antimicrobial [x]  Patient discharged originally without antimicrobial agent and treatment is now indicated  New antibiotic prescription: Macrobid  100 mg BID x 5 days   ED Provider: Dr. Jossie    Patient is a 83 yo F who presented to the ED with dizziness, dysuria, and weakness. PMH significant for recurrent UTIs. Patient had a UA collected which was not consistent with a UTI. However, due to her concern of having a UTI, a urine culture was ordered. Urine culture is now growing > 100,000 colonies of E. Coli. Discussed with ED provider about colonization vs true infection. Since the patient was having some symptoms of UTI, decided to treat with Macrobid  x 5 days.   Called patient to inform about the culture results and change in treatment plan. Prescription called in to [facility].    Ransom Blanch PGY-1 Pharmacy Resident  [facility]  02/24/2024 5:03 PM

## 2024-03-31 ENCOUNTER — Other Ambulatory Visit: Payer: Self-pay | Admitting: Primary Care

## 2024-03-31 DIAGNOSIS — E785 Hyperlipidemia, unspecified: Secondary | ICD-10-CM

## 2024-03-31 DIAGNOSIS — I10 Essential (primary) hypertension: Secondary | ICD-10-CM

## 2024-06-15 ENCOUNTER — Ambulatory Visit: Admitting: Primary Care

## 2024-08-23 ENCOUNTER — Ambulatory Visit

## 2024-08-26 ENCOUNTER — Ambulatory Visit

## 2024-09-01 ENCOUNTER — Ambulatory Visit

## 2024-12-23 ENCOUNTER — Ambulatory Visit: Admitting: Urology
